# Patient Record
Sex: Female | Born: 1950
Health system: Southern US, Community
[De-identification: ages and names within clinical notes are randomized; demographics above are authoritative.]

## PROBLEM LIST (undated history)

## (undated) DIAGNOSIS — K449 Diaphragmatic hernia without obstruction or gangrene: Secondary | ICD-10-CM

## (undated) DIAGNOSIS — F32A Depression, unspecified: Secondary | ICD-10-CM

## (undated) DIAGNOSIS — I421 Obstructive hypertrophic cardiomyopathy: Secondary | ICD-10-CM

## (undated) DIAGNOSIS — K219 Gastro-esophageal reflux disease without esophagitis: Secondary | ICD-10-CM

## (undated) DIAGNOSIS — I639 Cerebral infarction, unspecified: Secondary | ICD-10-CM

## (undated) DIAGNOSIS — I2510 Atherosclerotic heart disease of native coronary artery without angina pectoris: Secondary | ICD-10-CM

## (undated) DIAGNOSIS — I350 Nonrheumatic aortic (valve) stenosis: Secondary | ICD-10-CM

## (undated) DIAGNOSIS — Z720 Tobacco use: Secondary | ICD-10-CM

## (undated) DIAGNOSIS — F329 Major depressive disorder, single episode, unspecified: Secondary | ICD-10-CM

## (undated) DIAGNOSIS — J449 Chronic obstructive pulmonary disease, unspecified: Secondary | ICD-10-CM

## (undated) DIAGNOSIS — R519 Headache, unspecified: Secondary | ICD-10-CM

## (undated) DIAGNOSIS — D649 Anemia, unspecified: Secondary | ICD-10-CM

## (undated) DIAGNOSIS — Z9889 Other specified postprocedural states: Secondary | ICD-10-CM

## (undated) DIAGNOSIS — F1010 Alcohol abuse, uncomplicated: Secondary | ICD-10-CM

## (undated) DIAGNOSIS — K759 Inflammatory liver disease, unspecified: Secondary | ICD-10-CM

## (undated) DIAGNOSIS — I10 Essential (primary) hypertension: Secondary | ICD-10-CM

## (undated) DIAGNOSIS — J189 Pneumonia, unspecified organism: Secondary | ICD-10-CM

## (undated) DIAGNOSIS — J42 Unspecified chronic bronchitis: Secondary | ICD-10-CM

## (undated) DIAGNOSIS — I499 Cardiac arrhythmia, unspecified: Secondary | ICD-10-CM

## (undated) DIAGNOSIS — R0600 Dyspnea, unspecified: Secondary | ICD-10-CM

## (undated) DIAGNOSIS — R51 Headache: Secondary | ICD-10-CM

## (undated) DIAGNOSIS — F4001 Agoraphobia with panic disorder: Secondary | ICD-10-CM

## (undated) DIAGNOSIS — I739 Peripheral vascular disease, unspecified: Secondary | ICD-10-CM

## (undated) DIAGNOSIS — F411 Generalized anxiety disorder: Secondary | ICD-10-CM

## (undated) DIAGNOSIS — I480 Paroxysmal atrial fibrillation: Secondary | ICD-10-CM

## (undated) DIAGNOSIS — E7800 Pure hypercholesterolemia, unspecified: Secondary | ICD-10-CM

## (undated) DIAGNOSIS — R112 Nausea with vomiting, unspecified: Secondary | ICD-10-CM

## (undated) DIAGNOSIS — M1990 Unspecified osteoarthritis, unspecified site: Secondary | ICD-10-CM

## (undated) DIAGNOSIS — M5430 Sciatica, unspecified side: Secondary | ICD-10-CM

## (undated) HISTORY — DX: Peripheral vascular disease, unspecified: I73.9

## (undated) HISTORY — DX: Cerebral infarction, unspecified: I63.9

## (undated) HISTORY — PX: NASAL FRACTURE SURGERY: SHX718

## (undated) HISTORY — DX: Atherosclerotic heart disease of native coronary artery without angina pectoris: I25.10

## (undated) HISTORY — PX: SHOULDER ARTHROSCOPY WITH ROTATOR CUFF REPAIR: SHX5685

## (undated) HISTORY — PX: BACK SURGERY: SHX140

## (undated) HISTORY — PX: KNEE ARTHROSCOPY: SHX127

## (undated) HISTORY — DX: Paroxysmal atrial fibrillation: I48.0

## (undated) HISTORY — PX: FRACTURE SURGERY: SHX138

## (undated) HISTORY — PX: HERNIA REPAIR: SHX51

## (undated) HISTORY — PX: LUMBAR DISC SURGERY: SHX700

## (undated) HISTORY — PX: OTHER SURGICAL HISTORY: SHX169

## (undated) HISTORY — DX: Agoraphobia with panic disorder: F40.01

## (undated) HISTORY — DX: Nonrheumatic aortic (valve) stenosis: I35.0

## (undated) HISTORY — DX: Generalized anxiety disorder: F41.1

## (undated) HISTORY — DX: Tobacco use: Z72.0

## (undated) HISTORY — DX: Obstructive hypertrophic cardiomyopathy: I42.1

## (undated) HISTORY — PX: LAPAROSCOPIC CHOLECYSTECTOMY: SUR755

## (undated) HISTORY — PX: ANAL FISTULECTOMY: SHX1139

---
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
## 2014-03-15 ENCOUNTER — Encounter (HOSPITAL_BASED_OUTPATIENT_CLINIC_OR_DEPARTMENT_OTHER): Payer: Self-pay | Admitting: Emergency Medicine

## 2014-03-15 ENCOUNTER — Emergency Department (HOSPITAL_BASED_OUTPATIENT_CLINIC_OR_DEPARTMENT_OTHER)
Admission: EM | Admit: 2014-03-15 | Discharge: 2014-03-15 | Disposition: A | Discharge status: Home / Self Care | Payer: Medicare Other | Attending: Emergency Medicine | Admitting: Emergency Medicine

## 2014-03-15 DIAGNOSIS — Z8639 Personal history of other endocrine, nutritional and metabolic disease: Secondary | ICD-10-CM | POA: Insufficient documentation

## 2014-03-15 DIAGNOSIS — Z79899 Other long term (current) drug therapy: Secondary | ICD-10-CM | POA: Diagnosis not present

## 2014-03-15 DIAGNOSIS — Z720 Tobacco use: Secondary | ICD-10-CM | POA: Diagnosis not present

## 2014-03-15 DIAGNOSIS — F410 Panic disorder [episodic paroxysmal anxiety] without agoraphobia: Secondary | ICD-10-CM | POA: Diagnosis present

## 2014-03-15 DIAGNOSIS — I10 Essential (primary) hypertension: Secondary | ICD-10-CM | POA: Insufficient documentation

## 2014-03-15 DIAGNOSIS — F419 Anxiety disorder, unspecified: Secondary | ICD-10-CM

## 2014-03-15 DIAGNOSIS — Z8719 Personal history of other diseases of the digestive system: Secondary | ICD-10-CM | POA: Insufficient documentation

## 2014-03-15 HISTORY — DX: Diaphragmatic hernia without obstruction or gangrene: K44.9

## 2014-03-15 HISTORY — DX: Essential (primary) hypertension: I10

## 2014-03-15 HISTORY — DX: Pure hypercholesterolemia, unspecified: E78.00

## 2014-03-15 MED ORDER — ALPRAZOLAM 0.5 MG PO TABS
0.5000 mg | ORAL_TABLET | Freq: Once | ORAL | Status: AC
  Administered 2014-03-15: 0.5 mg via ORAL
  Filled 2014-03-15: qty 1

## 2014-03-15 MED ORDER — ALPRAZOLAM 1 MG PO TABS: 0.5000 mg | ORAL_TABLET | Freq: Three times a day (TID) | ORAL | Status: DC | PRN

## 2014-03-15 NOTE — ED Notes (Signed)
D/c home with her sister- directed to pharmacy to pick up meds- resource guide reviewed

## 2014-03-15 NOTE — Discharge Instructions (Signed)
Panic Attacks Panic attacks are sudden, short-livedsurges of severe anxiety, fear, or discomfort. They may occur for no reason when you are relaxed, when you are anxious, or when you are sleeping. Panic attacks may occur for a number of reasons:   Healthy people occasionally have panic attacks in extreme, life-threatening situations, such as war or natural disasters. Normal anxiety is a protective mechanism of the body that helps [place] react to danger (fight or flight response).  Panic attacks are often seen with anxiety disorders, such as panic disorder, social anxiety disorder, generalized anxiety disorder, and phobias. Anxiety disorders cause excessive or uncontrollable anxiety. They may interfere with your relationships or other life activities.  Panic attacks are sometimes seen with other mental illnesses, such as depression and posttraumatic stress disorder.  Certain medical conditions, prescription medicines, and drugs of abuse can cause panic attacks. SYMPTOMS  Panic attacks start suddenly, peak within 20 minutes, and are accompanied by four or more of the following symptoms:  Pounding heart or fast heart rate (palpitations).  Sweating.  Trembling or shaking.  Shortness of breath or feeling smothered.  Feeling choked.  Chest pain or discomfort.  Nausea or strange feeling in your stomach.  Dizziness, light-headedness, or feeling like you will faint.  Chills or hot flushes.  Numbness or tingling in your lips or hands and feet.  Feeling that things are not real or feeling that you are not yourself.  Fear of losing control or going crazy.  Fear of dying. Some of these symptoms can mimic serious medical conditions. For example, you may think you are having a heart attack. Although panic attacks can be very scary, they are not life threatening. DIAGNOSIS  Panic attacks are diagnosed through an assessment by your health care provider. Your health care provider will ask  questions about your symptoms, such as where and when they occurred. Your health care provider will also ask about your medical history and use of alcohol and drugs, including prescription medicines. Your health care provider may order blood tests or other studies to rule out a serious medical condition. Your health care provider may refer you to a mental health professional for further evaluation. TREATMENT   Most healthy people who have one or two panic attacks in an extreme, life-threatening situation will not require treatment.  The treatment for panic attacks associated with anxiety disorders or other mental illness typically involves counseling with a mental health professional, medicine, or a combination of both. Your health care provider will help determine what treatment is best for you.  Panic attacks due to physical illness usually go away with treatment of the illness. If prescription medicine is causing panic attacks, talk with your health care provider about stopping the medicine, decreasing the dose, or substituting another medicine.  Panic attacks due to alcohol or drug abuse go away with abstinence. Some adults need professional help in order to stop drinking or using drugs. HOME CARE INSTRUCTIONS   Take all medicines as directed by your health care provider.   Schedule and attend follow-up visits as directed by your health care provider. It is important to keep all your appointments. SEEK MEDICAL CARE IF:  You are not able to take your medicines as prescribed.  Your symptoms do not improve or get worse. SEEK IMMEDIATE MEDICAL CARE IF:   You experience panic attack symptoms that are different than your usual symptoms.  You have serious thoughts about hurting yourself or others.  You are taking medicine for panic attacks and  have a serious side effect. °MAKE SURE YOU: °· Understand these instructions. °· Will watch your condition. °· Will get help right away if you are not  doing well or get worse. °Document Released: 05/07/2005 Document Revised: 05/12/2013 Document Reviewed: 12/19/2012 °ExitCare® Patient Information ©2015 [employer]. This information is not intended to replace advice given to you by your health care provider. Make sure you discuss any questions you have with your health care provider. ° °Emergency Department Resource Guide °1) Find a Doctor and Pay Out of Pocket °Although you won't have to find out who is covered by your insurance plan, it is a good idea to ask around and get recommendations. You will then need to call the office and see if the doctor you have chosen will accept you as a new patient and what types of options they offer for patients who are self-pay. Some doctors offer discounts or will set up payment plans for their patients who do not have insurance, but you will need to ask so you aren't surprised when you get to your appointment. ° °2) Contact Your Local Health Department °Not all health departments have doctors that can see patients for sick visits, but many do, so it is worth a call to see if yours does. If you don't know where your local health department is, you can check in your phone book. The CDC also has a tool to help you locate your state's health department, and many state websites also have listings of all of their local health departments. ° °3) Find a Walk-in Clinic °If your illness is not likely to be very severe or complicated, you may want to try a walk in clinic. These are popping up all over the country in pharmacies, drugstores, and shopping centers. They're usually staffed by nurse practitioners or physician assistants that have been trained to treat common illnesses and complaints. They're usually fairly quick and inexpensive. However, if you have serious medical issues or chronic medical problems, these are probably not your best option. ° °No Primary Care Doctor: °- Call [facility] at  832-8000 - they can help you  locate a primary care doctor that  accepts your insurance, provides certain services, etc. °- Physician Referral Service- 1-800-533-3463 ° °Chronic Pain Problems: °Organization         Address  Phone   Notes  °[facility]  (336) 297-2271 Patients need to be referred by their primary care doctor.  ° °Medication Assistance: °Organization         Address  Phone   Notes  °[place] [facility] [address] °[place], [place] [address] (336) 641-8030 --Must be a resident of [place] °-- Must have NO insurance coverage whatsoever (no Medicaid/ Medicare, etc.) °-- The pt. MUST have a primary care doctor that directs their care regularly and follows them in the community °  °[facility]  (866) 331-1348   °[employer]  (888) 892-1162   ° °Agencies that provide inexpensive medical care: °Organization         Address  Phone   Notes  °[facility]  (336) 832-8035   °[facility]    (336) 832-7272   °[facility] [address] °[place], [place] [address] (336) 832-4777   °[facility] [address], ° (336) 271-4999   °[facility]    (336) 373-0678   °[facility]    (336) 272-1050   °  [facility] ° [address], [place] Phone:  (336) 832-4444, Fax:  (336) 832-4440 Hours of Operation:  9 am - 6 pm, M-F.  Also accepts Medicaid/Medicare and self-pay.  °[facility] ° [address], [place] Phone: (336) 832-3150, Fax: (336) 832-3151. Hours of Operation:  8:30 am - 5:30 pm, M-F.  Also accepts Medicaid and self-pay.  °[facility] [place] [address], [place] Phone: (336) 878-6027   °[facility] [address], [place], [place] (336)723-1848, Ext. 123 Mondays & Thursdays: 7-9 AM.  First 15 patients are seen on a first come, first serve basis. °  ° °Medicaid-accepting [place]  Providers: ° °Organization         Address  Phone   Notes  °[facility] [address], [place] (336) 641-2100 Also accepts self-pay patients.  °[facility] [address], [place] ° (336) 856-9996   °[facility] [address], [place] (336) 288-8857   °[facility] [address] [place] [address], [place] (336) 299-7000   °Veita Bland [address], [place]  ° (336) 373-1557 Only accepts [place] Access Medicaid patients after they have their name applied to their card.  ° °Self-Pay (no insurance) in [place]: ° °Organization         Address  Phone   Notes  °Sickle Cell Patients, [facility] [address], [place] (336) 832-1970   °[facility] [address], [place] (336) 832-4400   °[facility] ° [address], [place] (336) 992-4800   °[facility]/Dr. Osei-Bonsu ° [address] [place] [address], [place] or [address], [place] (336) 841-8500 Phone number for both [place] and [place] locations is the same.  °[facility] [address], [place] (336) 299-0000   °[facility] [address] [place] [address], [place] or [address] (336) 852-7530 °(336) 878-2260   °[facility] [address], [place] (336) 350-1642, phone; (336) 294-5005, fax Sees patients 1st and 3rd Saturday of every month.  Must not qualify for public or private insurance (i.e. Medicaid, Medicare, [place] Health Choice, Veterans' Benefits) • Household income should be no more than 200% of the poverty level •The clinic cannot treat you if you are pregnant or think you are pregnant • Sexually transmitted diseases are not treated at the clinic.  ° ° °Dental Care: °Organization         Address  Phone  Notes  °[facility] [address],  (336) 641-6152 Accepts children up to age 21 who are enrolled in Medicaid or [place] Health Choice; pregnant women with a Medicaid card; and children who have applied for Medicaid or [place] Health Choice, but were declined, whose parents can pay a reduced fee at time of service.  °[facility] [place]  [address], [place] (336) 641-7733 Accepts children up to age 21 who are enrolled in Medicaid or [place] Health Choice; pregnant women with a Medicaid card; and children who have applied for Medicaid or [place] Health Choice, but were declined, whose parents can pay a reduced fee at time of service.  °[facility] ° [address], [place] (336) 641-4533 Patients are seen by appointment only. Walk-ins are not accepted. [facility] will see patients 18 years of age and older. °Monday - Tuesday (8am-5pm) °Most Wednesdays (8:30-5pm) °$30 per visit, cash only  °[facility] ° [address], [place] (336) 641-4533 Patients are seen by appointment only. Walk-ins are not accepted. [facility] will see patients 18 years of age and older. °One Wednesday Evening (Monthly: Volunteer Based).  $30 per visit, cash only  °[facility]  (919) 537-3737 for adults; Children under age 4, call Graduate Pediatric Dentistry at (919) [phone]. Children aged 4-14, please call (919) [phone] to request a pediatric application. ° Dental services are provided in all areas of dental care including fillings, crowns and bridges, complete and partial dentures, implants, gum treatment, root canals, and extractions. Preventive care is also provided. Treatment is provided to both adults and children. °Patients are selected via a lottery and there is often a waiting list. °  °[facility] [address], °[place] ° (336) 763-8833 [email or web address] °  °[facility]  [address], [place], [place] (336)723-1848, Ext. 123 Second and Fourth Thursday of each month, opens at 6:30 AM; Clinic ends at 9 AM.  Patients are seen on a first-come first-served basis, and a limited number are seen during each clinic.  ° °[facility] ° [address], [place], [place] (336) 723-7904   Eligibility Requirements °You must have lived in [place], [place], or [place] counties for at least the last three months. °  You cannot be eligible for state or federal sponsored healthcare insurance, including Veterans Administration, Medicaid, or Medicare. °  You generally cannot be eligible for healthcare insurance through your employer.  °  How to apply: °Eligibility screenings are held every Tuesday and Wednesday afternoon from 1:00 pm until 4:00 pm. You do not need an appointment for the interview!  °[facility] [address], [place], [place] 336-631-2330   °[facility] [facility]  336-342-8273   °[facility]  336-703-3100   °[facility]  336-570-6415   ° °Behavioral Health Resources in the Community: °Intensive Outpatient Programs °Organization         Address  Phone  Notes  °[facility] [address], [place], [place] 336-878-6098   °[facility] [address], [place], [place] 336-832-9800   °[facility] [address], [place], [place] ° 336-882-2125   °[facility] [address],  °[place], [place] 1-800-853-5163 or 336-641-4981   °Substance Abuse Resources °Organization         Address  Phone  Notes  °[facility]  336-882-2125   °[facility]  336-784-9470   °[facility]  336-285-9073   °[facility]  336-845-3988   °Residential & Outpatient Substance Abuse Program  1-800-659-3381   °Psychological Services °Organization         Address  Phone  Notes  °[facility]  336- 832-9600     °[facility]  336- 378-7881   °[facility] [address], [place] 1-800-853-5163 or 336-641-4981   ° °Mobile Crisis Teams °Organization         Address  Phone  Notes  °[facility], Mobile Crisis Care Unit  1-877-626-1772   °Assertive °[facility] ° [address] [place], [place] 336-834-9664   °  Sharon DeEsch [address] °[place] 336-554-5454   ° °Self-Help/Support Groups °Organization         Address  Phone             Notes  °[facility] [place] - variety of support groups  336- 373-1402 Call for more information  °Narcotics Anonymous (NA), [facility] [address], °[place]  2 meetings at this location  ° °Residential Treatment Programs °Organization         Address  Phone  Notes  °[facility] [address],    °[place]  1-866-801-8205   °[facility] ° [address], [place], [place] 704-293-8524   °[facility] [address], [place] 336-845-3988 Admissions: 8am-3pm M-F  °[facility] [address]    °[place], [place] 336-841-1104   °[facility] [address], [place], [place] 336-379-7146   °[facility] [address]  °[place], [place] 336-285-9073   °[facility] - Intensive Outpatient [address], [place], [place] 336-852-3033   °[facility] [address]  °[place], [place] 1-877-615-2722 or 336-784-9470   °[facility] [address] [place], [place] 336-227-7417 Accepts Medicaid  °[facility] [address]  ° [place] 1-800-659-3381 Substance Abuse/Addiction Treatment  ° °[place] Behavioral Health Resources °Organization         Address  Phone  Notes  °[facility]  (888) 581-9988   °Julie Brannon, PhD [address] [place], [place]   (336) 349-5553 or (336) 951-0000    °[facility]   [address] °[place], [place] (336) 349-4454   °[facility] [address], [place], [place] (336) 342-8316 Insurance/Medicaid/sponsorship through [facility]  °[facility] [address]                                    [place], [place] (336) 342-8316 Therapy/tele-psych/case  °[facility] [address]  ° [place], [place] (336) 349-2233    °Dr. Arfeen  (336) 349-4544   °[facility] [place]  [employer] [place] [facility] 1) [address], [place] °2) [address], [place] °3)  [address], [place] (336) 349-3220 °(336) 342-7768 ° °(336) 342-8140   °[place] [facility] (336) 342-1394 or (336) 342-3537 (After Hours)    ° ° ° °

## 2014-03-15 NOTE — ED Provider Notes (Signed)
CSN: [id number]     Arrival date & time 03/15/14  1404 History   First MD Initiated Contact with Patient 03/15/14 1450     Chief Complaint  Patient presents with  . Panic Attack     (Consider location/radiation/quality/duration/timing/severity/associated sxs/prior Treatment) HPI Comments: Pt comes in today for a refill on her xanax and ambien. Pt states that she has been out of both for the last five days. She moved down here form [place] in the last month and she hasn't found a doctor. She states that she is not si/hi. She has no refill on her medications. Pt states that she has been very tearful. Pt has also not been taking her paxil. She does have refill on the paxil  The history is provided by the patient. No language interpreter was used.    Past Medical History  Diagnosis Date  . Hypertension   . High cholesterol   . Hernia, hiatal    Past Surgical History  Procedure Laterality Date  . Back surgery    . Shoulder arthroscopy    . Cholecystectomy     No family history on file. History  Substance Use Topics  . Smoking status: Current Every Day Smoker -- 2.00 packs/day    Types: Cigarettes  . Smokeless tobacco: Not on file  . Alcohol Use: 25.2 oz/week    42 Cans of beer per week     Comment: 6 beers/day   OB History   Grav Para Term Preterm Abortions TAB SAB Ect Mult Living                 Review of Systems  All other systems reviewed and are negative.     Allergies  Review of patient's allergies indicates no known allergies.  Home Medications   Prior to Admission medications   Medication Sig Start Date End Date Taking? Authorizing Provider  ALPRAZolam Duanne Moron) 0.5 MG tablet Take 0.5 mg by mouth 4 (four) times daily.   Yes Historical Provider, MD  amLODipine (NORVASC) 5 MG tablet Take 5 mg by mouth daily.   Yes Historical Provider, MD  baclofen (LIORESAL) 10 MG tablet Take 10 mg by mouth 3 (three) times daily.   Yes Historical Provider, MD   lisinopril-hydrochlorothiazide (PRINZIDE,ZESTORETIC) 10-12.5 MG per tablet Take 1 tablet by mouth daily.   Yes Historical Provider, MD  PARoxetine (PAXIL) 10 MG tablet Take 10 mg by mouth daily.   Yes Historical Provider, MD  ranitidine (ZANTAC) 150 MG/10ML syrup Take by mouth 2 (two) times daily.   Yes Historical Provider, MD  zolpidem (AMBIEN) 10 MG tablet Take 10 mg by mouth at bedtime as needed for sleep.   Yes Historical Provider, MD  ALPRAZolam Duanne Moron) 1 MG tablet Take 0.5 tablets (0.5 mg total) by mouth 3 (three) times daily as needed for anxiety. 03/15/14   Glendell Docker, NP   BP 186/95  Pulse 80  Temp(Src) 97.9 F (36.6 C) (Oral)  Resp 18  Ht 5\' 2"  (1.575 m)  Wt 155 lb (70.308 kg)  BMI 28.34 kg/m2  SpO2 100% Physical Exam  Nursing note and vitals reviewed. Constitutional: She is oriented to person, place, and time. She appears well-developed and well-nourished.  Cardiovascular: Normal rate and regular rhythm.   Pulmonary/Chest: Effort normal and breath sounds normal.  Musculoskeletal: Normal range of motion.  Neurological: She is oriented to person, place, and time.  Skin: Skin is warm and dry.  Psychiatric:  tearful    ED Course  Procedures (  including critical care time) Labs Review Labs Reviewed - No data to display  Imaging Review No results found.   EKG Interpretation None      MDM   Final diagnoses:  Anxiety    Will give xanax. Pt given resource guide. No si/hi    Glendell Docker, NP 03/15/14 1516

## 2014-03-15 NOTE — ED Notes (Signed)
Pt. Reports she moved here from [place] in Sept. And has no med refills on any of her Xanax or Ambien  And has been out of her xanax and Ambien for 5 days.  Pt. Reports not sleeping and feeling anxious.    Pt. Needs a PCP and medicine.  Pt. Is able to speak and is in no  Distress in triage.

## 2014-03-17 NOTE — ED Provider Notes (Signed)
Medical screening examination/treatment/procedure(s) were performed by non-physician practitioner and as supervising physician I was immediately available for consultation/collaboration.   EKG Interpretation None        Blanchie Dessert, MD 03/17/14 1526

## 2014-04-30 ENCOUNTER — Emergency Department (HOSPITAL_BASED_OUTPATIENT_CLINIC_OR_DEPARTMENT_OTHER)
Admission: EM | Admit: 2014-04-30 | Discharge: 2014-04-30 | Disposition: A | Discharge status: Home / Self Care | Payer: Medicare HMO | Attending: Emergency Medicine | Admitting: Emergency Medicine

## 2014-04-30 ENCOUNTER — Encounter (HOSPITAL_BASED_OUTPATIENT_CLINIC_OR_DEPARTMENT_OTHER): Payer: Self-pay

## 2014-04-30 DIAGNOSIS — I10 Essential (primary) hypertension: Secondary | ICD-10-CM | POA: Insufficient documentation

## 2014-04-30 DIAGNOSIS — Z8639 Personal history of other endocrine, nutritional and metabolic disease: Secondary | ICD-10-CM | POA: Insufficient documentation

## 2014-04-30 DIAGNOSIS — Z8719 Personal history of other diseases of the digestive system: Secondary | ICD-10-CM | POA: Diagnosis not present

## 2014-04-30 DIAGNOSIS — M5431 Sciatica, right side: Secondary | ICD-10-CM | POA: Insufficient documentation

## 2014-04-30 DIAGNOSIS — Z720 Tobacco use: Secondary | ICD-10-CM | POA: Diagnosis not present

## 2014-04-30 DIAGNOSIS — Z791 Long term (current) use of non-steroidal anti-inflammatories (NSAID): Secondary | ICD-10-CM | POA: Insufficient documentation

## 2014-04-30 DIAGNOSIS — Z79899 Other long term (current) drug therapy: Secondary | ICD-10-CM | POA: Diagnosis not present

## 2014-04-30 DIAGNOSIS — M549 Dorsalgia, unspecified: Secondary | ICD-10-CM | POA: Diagnosis present

## 2014-04-30 HISTORY — DX: Sciatica, unspecified side: M54.30

## 2014-04-30 MED ORDER — HYDROMORPHONE HCL 1 MG/ML IJ SOLN
1.0000 mg | Freq: Once | INTRAMUSCULAR | Status: AC
Start: 2014-04-30 — End: 2014-04-30
  Administered 2014-04-30: 1 mg via INTRAMUSCULAR
  Filled 2014-04-30: qty 1

## 2014-04-30 MED ORDER — OXYCODONE-ACETAMINOPHEN 5-325 MG PO TABS: 1.0000 | ORAL_TABLET | Freq: Four times a day (QID) | ORAL | Status: DC | PRN

## 2014-04-30 NOTE — ED Provider Notes (Signed)
CSN: [id number]     Arrival date & time 04/30/14  1046 History   First MD Initiated Contact with Patient 04/30/14 1145     Chief Complaint  Patient presents with  . Back Pain     (Consider location/radiation/quality/duration/timing/severity/associated sxs/prior Treatment) HPI Complains of right buttock pain radiating to right foot onset one week ago typical sciatica she's had in the past. Onset after she mis-stepped no loss of bladder or bowel control no other complaint treated with Aleve without relief pain worse with walking or weightbearing improved with rest or no other associated symptoms Past Medical History  Diagnosis Date  . Hypertension   . High cholesterol   . Hernia, hiatal   . Back pain   . Sciatica    Past Surgical History  Procedure Laterality Date  . Back surgery    . Shoulder arthroscopy    . Cholecystectomy     No family history on file. History  Substance Use Topics  . Smoking status: Current Every Day Smoker -- 2.00 packs/day    Types: Cigarettes  . Smokeless tobacco: Not on file  . Alcohol Use: 25.2 oz/week    42 Cans of beer per week     Comment: last drink 1 week ago   no illicit drug use OB History    No data available     Review of Systems  Constitutional: Negative.   HENT: Negative.   Respiratory: Negative.   Cardiovascular: Negative.   Gastrointestinal: Negative.   Musculoskeletal: Positive for back pain.  Skin: Negative.   Neurological: Negative.   Psychiatric/Behavioral: Negative.   All other systems reviewed and are negative.     Allergies  Review of patient's allergies indicates no known allergies.  Home Medications   Prior to Admission medications   Medication Sig Start Date End Date Taking? Authorizing Provider  ALPRAZolam Duanne Moron) 0.5 MG tablet Take 0.5 mg by mouth 4 (four) times daily.    Historical Provider, MD  ALPRAZolam Duanne Moron) 1 MG tablet Take 0.5 tablets (0.5 mg total) by mouth 3 (three) times daily as needed for  anxiety. 03/15/14   Glendell Docker, NP  amLODipine (NORVASC) 5 MG tablet Take 5 mg by mouth daily.    Historical Provider, MD  baclofen (LIORESAL) 10 MG tablet Take 10 mg by mouth 3 (three) times daily.    Historical Provider, MD  lisinopril-hydrochlorothiazide (PRINZIDE,ZESTORETIC) 10-12.5 MG per tablet Take 1 tablet by mouth daily.    Historical Provider, MD  PARoxetine (PAXIL) 10 MG tablet Take 10 mg by mouth daily.    Historical Provider, MD  ranitidine (ZANTAC) 150 MG/10ML syrup Take by mouth 2 (two) times daily.    Historical Provider, MD  zolpidem (AMBIEN) 10 MG tablet Take 10 mg by mouth at bedtime as needed for sleep.    Historical Provider, MD   BP 187/99 mmHg  Pulse 88  Temp(Src) 98 F (36.7 C) (Oral)  Resp 20  Ht 5\' 4"  (1.626 m)  Wt 150 lb (68.04 kg)  BMI 25.73 kg/m2  SpO2 100% Physical Exam  Constitutional: She is oriented to person, place, and time. She appears well-developed and well-nourished. She appears distressed.  Appears uncomfortable  HENT:  Head: Normocephalic and atraumatic.  Eyes: Conjunctivae are normal. Pupils are equal, round, and reactive to light.  Neck: Neck supple. No tracheal deviation present. No thyromegaly present.  Cardiovascular: Normal rate and regular rhythm.   No murmur heard. Pulmonary/Chest: Effort normal and breath sounds normal.  Abdominal: Soft. Bowel sounds are  normal. She exhibits no distension. There is no tenderness.  Musculoskeletal: Normal range of motion. She exhibits no edema or tenderness.  Neurological: She is alert and oriented to person, place, and time. She has normal reflexes. Coordination normal.  DTRs symmetric bilaterally at knee jerk ankle jerk biceps toes down going bilaterally  Skin: Skin is warm and dry. No rash noted.  Psychiatric: She has a normal mood and affect.  Nursing note and vitals reviewed.   ED Course  Procedures (including critical care time) Labs Review Labs Reviewed - No data to  display  Imaging Review No results found.   EKG Interpretation None     12:15 PM pain is improved after treatment with IM hydromorphone. She is alert ambulatory Glasgow Coma Score 15. MDM  Sciatica is chronic. Plan prescription Percocet. She no longer uses Xanax. She is instructed not to drink alcohol when taking Percocet. She is furnished with resource guide to get primary care physician. She is instructed to take her blood pressure medication upon arrival home today. Blood pressure recheck 2 or 3 weeks Diagnosis #1 sciatica  #2 hypertension  Final diagnoses:  None        Orlie Dakin, MD 04/30/14 1227

## 2014-04-30 NOTE — ED Notes (Signed)
MD at bedside. 

## 2014-04-30 NOTE — Discharge Instructions (Signed)
Sciatica Take Tylenol for mild pain or the pain medicine prescribed for bad pain. Do not drink alcohol  Or use Xanax when taking the medicine prescribed, as the combination can be dangerous. Call any of the numbers on the resource guide to get a primary care physician. Take your blood pressure medication when you get home today. Today's was elevated at 160/99. Get your blood pressure rechecked within the next 2-3 weeks Sciatica is pain, weakness, numbness, or tingling along the path of the sciatic nerve. The nerve starts in the lower back and runs down the back of each leg. The nerve controls the muscles in the lower leg and in the back of the knee, while also providing sensation to the back of the thigh, lower leg, and the sole of your foot. Sciatica is a symptom of another medical condition. For instance, nerve damage or certain conditions, such as a herniated disk or bone spur on the spine, pinch or put pressure on the sciatic nerve. This causes the pain, weakness, or other sensations normally associated with sciatica. Generally, sciatica only affects one side of the body. CAUSES   Herniated or slipped disc.  Degenerative disk disease.  A pain disorder involving the narrow muscle in the buttocks (piriformis syndrome).  Pelvic injury or fracture.  Pregnancy.  Tumor (rare). SYMPTOMS  Symptoms can vary from mild to very severe. The symptoms usually travel from the low back to the buttocks and down the back of the leg. Symptoms can include:  Mild tingling or dull aches in the lower back, leg, or hip.  Numbness in the back of the calf or sole of the foot.  Burning sensations in the lower back, leg, or hip.  Sharp pains in the lower back, leg, or hip.  Leg weakness.  Severe back pain inhibiting movement. These symptoms may get worse with coughing, sneezing, laughing, or prolonged sitting or standing. Also, being overweight may worsen symptoms. DIAGNOSIS  Your caregiver will perform a  physical exam to look for common symptoms of sciatica. He or she may ask you to do certain movements or activities that would trigger sciatic nerve pain. Other tests may be performed to find the cause of the sciatica. These may include:  Blood tests.  X-rays.  Imaging tests, such as an MRI or CT scan. TREATMENT  Treatment is directed at the cause of the sciatic pain. Sometimes, treatment is not necessary and the pain and discomfort goes away on its own. If treatment is needed, your caregiver may suggest:  Over-the-counter medicines to relieve pain.  Prescription medicines, such as anti-inflammatory medicine, muscle relaxants, or narcotics.  Applying heat or ice to the painful area.  Steroid injections to lessen pain, irritation, and inflammation around the nerve.  Reducing activity during periods of pain.  Exercising and stretching to strengthen your abdomen and improve flexibility of your spine. Your caregiver may suggest losing weight if the extra weight makes the back pain worse.  Physical therapy.  Surgery to eliminate what is pressing or pinching the nerve, such as a bone spur or part of a herniated disk. HOME CARE INSTRUCTIONS   Only take over-the-counter or prescription medicines for pain or discomfort as directed by your caregiver.  Apply ice to the affected area for 20 minutes, 3-4 times a day for the first 48-72 hours. Then try heat in the same way.  Exercise, stretch, or perform your usual activities if these do not aggravate your pain.  Attend physical therapy sessions as directed by your  caregiver.  Keep all follow-up appointments as directed by your caregiver.  Do not wear high heels or shoes that do not provide proper support.  Check your mattress to see if it is too soft. A firm mattress may lessen your pain and discomfort. SEEK IMMEDIATE MEDICAL CARE IF:   You lose control of your bowel or bladder (incontinence).  You have increasing weakness in the lower  back, pelvis, buttocks, or legs.  You have redness or swelling of your back.  You have a burning sensation when you urinate.  You have pain that gets worse when you lie down or awakens you at night.  Your pain is worse than you have experienced in the past.  Your pain is lasting longer than 4 weeks.  You are suddenly losing weight without reason. MAKE SURE YOU:  Understand these instructions.  Will watch your condition.  Will get help right away if you are not doing well or get worse. Document Released: 05/01/2001 Document Revised: 11/06/2011 Document Reviewed: 09/16/2011 [facility] Patient Information 2015 [place], [place]. This information is not intended to replace advice given to you by your health care provider. Make sure you discuss any questions you have with your health care provider.  Emergency Department Resource Guide 1) Find a Doctor and Pay Out of Pocket Although you won't have to find out who is covered by your insurance plan, it is a good idea to ask around and get recommendations. You will then need to call the office and see if the doctor you have chosen will accept you as a new patient and what types of options they offer for patients who are self-pay. Some doctors offer discounts or will set up payment plans for their patients who do not have insurance, but you will need to ask so you aren't surprised when you get to your appointment.  2) Contact Your Local Health Department Not all health departments have doctors that can see patients for sick visits, but many do, so it is worth a call to see if yours does. If you don't know where your local health department is, you can check in your phone book. The CDC also has a tool to help you locate your state's health department, and many state websites also have listings of all of their local health departments.  3) Find a [facility] If your illness is not likely to be very severe or complicated, you may want to try a walk  in clinic. These are popping up all over the country in pharmacies, drugstores, and shopping centers. They're usually staffed by nurse practitioners or physician assistants that have been trained to treat common illnesses and complaints. They're usually fairly quick and inexpensive. However, if you have serious medical issues or chronic medical problems, these are probably not your best option.  No Primary Care Doctor: - Call Health Connect at  [phone] - they can help you locate a primary care doctor that  accepts your insurance, provides certain services, etc. - Physician Referral Service- [phone]  Chronic Pain Problems: Organization         Address  Phone   Notes  [facility]  [phone] Patients need to be referred by their primary care doctor.   Medication Assistance: Organization         Address  Phone   Notes  [facility] Medication [facility] [address] [place], [place] [address] [phone] --Must be a resident of [facility] -- Must  have NO insurance coverage whatsoever (no Medicaid/ Medicare, etc.) -- The pt. MUST have a primary care doctor that directs their care regularly and follows them in the community   [facility]ist  [phone]   [employer]  [phone]    Agencies that provide inexpensive medical care: Organization         Address  Phone   Notes  [place]  [phone]   [facility]    [phone]   [facility] [place], [place] [address] [phone]   [place] [address], [place] [phone]   Planned Parenthood    [phone]   [facility]    [phone]   [place] and [place] [address], [place] Phone:  [phone], Fax:  [phone] Hours of Operation:  9 am - 6 pm, M-F.  Also accepts Medicaid/Medicare and self-pay.  [facility] for [place] [address], [address], [place] Phone: [phone], Fax: [phone]. Hours of Operation:  8:30 am - 5:30 pm, M-F.  Also accepts Medicaid and self-pay.  [facility] [place] [address] [place] Phone: [phone]   [place], [place], [place] [phone], Ext. [phone] Mondays & Thursdays: 7-9 AM.  First 15 patients are seen on a first come, first serve basis.    [place] Providers:  Organization         Address  Phone   Notes  [facility] [address], [place] [phone] Also accepts self-pay patients.  [facility] [address], [place]  [phone]   [place], [address], [place] [phone]   [facility] [address] [place] [phone]   Lucianne Lei [address], [place]   [phone] Only accepts [place] Access [place] patients after they have their name applied to their card.   Self-Pay (no insurance) in [facility]:  Organization         Address  Phone   Notes  Sickle Cell Patients, [facility] [place] [phone]   [facility]gent Care [place] [phone]   [facility] [place]  [place], [place], [place] [phone]   [facility]/Dr. Osei-Bonsu  [address] [place] or [address], [place] [phone] Phone number for both [place] and [place] locations is the same.  [facility] [address], [place] [phone]   [facility] [address], [place] or [address] [phone]   [facility] [address] [place] [phone], phone; [phone], fax Sees  patients 1st and 3rd Saturday of every month.  Must not qualify for public or private insurance (i.e. Medicaid, Medicare, [place] Health Choice, Veterans' Benefits)  Household income should be no more than 200% of the poverty level The clinic cannot treat you if you are pregnant or think you are pregnant  Sexually transmitted diseases are not treated at the clinic.    Dental Care: Organization         Address  Phone  Notes  [facility] [address] [place] [phone] Accepts children up to age 61 who are enrolled in [place] or [place]; pregnant women with a Medicaid card; and children who have applied for Medicaid or [place] Health Choice, but were declined, whose parents can pay a reduced fee at time of service.  [facility]  [address], [place] [phone] Accepts children up to age 65 who are enrolled in [place] or [place]; pregnant women with a Medicaid card; and children who have applied for Medicaid or [place] Health Choice, but were declined, whose parents can pay a reduced fee at time of service.  [place] Adult [facility] Access PROGRAM  [place] [phone] Patients are seen by appointment only. Walk-ins are not accepted. [place] will see patients 56 years of age and older. Monday - Tuesday (8am-5pm) Most Wednesdays (8:30-5pm) $30 per visit, cash only  [facility] Adult Dental Access PROGRAM  [address], [facility] [phone] Patients are seen by appointment only. Walk-ins are not accepted. [place]anda will see patients 64 years of age and older. One Wednesday Evening (Monthly: Volunteer Based).  $30 per visit, cash only  [place]  [phone] for adults; Children under age 64, call Graduate Pediatric Dentistry at [phone]. Children aged 27-14, please call [phone] to request a  pediatric application.  Dental services are provided in all areas of dental care including fillings, crowns and bridges, complete and partial dentures, implants, gum treatment, root canals, and extractions. Preventive care is also provided. Treatment is provided to both adults and children. Patients are selected via a lottery and there is often a waiting list.   [facility] [address] [place]  [phone] [email or web address]   [facility] [address] [place], [place] [phone], Ext. [phone] Second and Fourth Thursday of each month, opens at 6:30 AM; Clinic ends at 9 AM.  Patients are seen on a first-come first-served basis, and a limited number are seen during each clinic.   [facility]  [address] Hillard Danker [place], [place] [phone]   Eligibility Requirements You must have lived in [place], [place], or [place] counties for at least the last three months.   You cannot be eligible for state or federal sponsored [employer], including [employer], [place], or [employer].   You generally cannot be eligible for healthcare insurance through your employer.    How to apply: Eligibility screenings are held every Tuesday and Wednesday afternoon from 1:00 pm until 4:00 pm. You do not need an appointment for the interview!  [facility] [address], [place], [place]   [place]  [place] Department  [place]  [phone]    Behavioral Health Resources in the Community: Intensive Outpatient Programs Organization         Address  Phone  Notes  [place]. [address] [place], [place] [phone]   [facility] Outpatient [address], [place], [place]   ADS: Alcohol & Drug Svcs [address], [place], [place]   [place] [address],  [place], [place] or [phone]   Substance Abuse Resources Organization  Address  Phone  Notes  Alcohol and Drug Services  [phone]   [place]  [phone]   [facility] [place]  [phone]   Chinita Pester  [phone]   Residential & Outpatient Substance Abuse Program  [phone]   Psychological Services Organization         Address  Phone  Notes  [facility] [place]  [place]  [phone]   [place] [address], [place] or [phone]    Mobile Crisis Teams Organization         Address  Phone  Notes  [facility], Mobile Crisis Care Unit  [phone]   [facility]  [address] [place], [place]   [place] [address] [place] [phone]    Self-Help/Support Groups Organization         Address  Phone             Notes  [place]. of [place] - variety of support groups  [place] Call for more information  Narcotics Anonymous (NA), [facility] [address], [employer] [place]  2 meetings at this location   Special educational needs teacher         Address  Phone  Notes  [facility]idential [facility] [place],    [place]  [phone]   [facility]  [address] [place] [address]196, [place], [place]   [place], Center [phone] Admissions: 8am-3pm M-F  Incentives Substance [place] [address],    [place], [place] [phone]   [facility] [address] [place], [place], [place]   [facility] [address]  [place], [place]   [facility] Programs - Intensive Outpatient [address] Kristeen [address], [place], [place]   [facility] (Bernie.) [place].,    [place], [place] [phone] or [phone]   [facility] (RTS) [address] [place], [place] Accepts Medicaid  [facility] [address],  [place] [phone] Substance Abuse/Addiction Treatment   [facility] Organization         Address  Phone  Notes  [facility]  [phone]   Domenic Schwab, PhD [address] [place], [place]   [phone] or [phone]   [place], [place] [phone]   [facility] [address], [place], [place] [phone] Insurance/Medicaid/sponsorship through [facility] and Families [address]                                    [place], [place] [phone] [facility] [address], [place] [phone]    Dr. Adele Schilder  [phone]   [facility] Dept. 1) [address], [place] 2) [place] 3)  [place] [address], [place] [phone]  [phone]   [place] [phone] or [phone] (After Hours)

## 2014-04-30 NOTE — ED Notes (Signed)
Back pain radiating to right buttock and down leg.

## 2014-05-05 ENCOUNTER — Encounter: Payer: Self-pay | Admitting: Medical

## 2014-05-05 ENCOUNTER — Ambulatory Visit: Payer: Medicare Other | Admitting: Medical

## 2014-05-05 ENCOUNTER — Other Ambulatory Visit: Payer: Self-pay

## 2014-05-05 ENCOUNTER — Ambulatory Visit (INDEPENDENT_AMBULATORY_CARE_PROVIDER_SITE_OTHER): Payer: Medicare HMO | Admitting: Medical

## 2014-05-05 VITALS — BP 155/85 | HR 74 | Temp 98.1°F | Ht 62.5 in | Wt 171.0 lb

## 2014-05-05 DIAGNOSIS — I10 Essential (primary) hypertension: Secondary | ICD-10-CM

## 2014-05-05 DIAGNOSIS — E781 Pure hyperglyceridemia: Secondary | ICD-10-CM | POA: Insufficient documentation

## 2014-05-05 DIAGNOSIS — M5431 Sciatica, right side: Secondary | ICD-10-CM

## 2014-05-05 DIAGNOSIS — M5430 Sciatica, unspecified side: Secondary | ICD-10-CM | POA: Insufficient documentation

## 2014-05-05 DIAGNOSIS — F32A Depression, unspecified: Secondary | ICD-10-CM

## 2014-05-05 DIAGNOSIS — K219 Gastro-esophageal reflux disease without esophagitis: Secondary | ICD-10-CM | POA: Insufficient documentation

## 2014-05-05 DIAGNOSIS — E785 Hyperlipidemia, unspecified: Secondary | ICD-10-CM

## 2014-05-05 DIAGNOSIS — F329 Major depressive disorder, single episode, unspecified: Secondary | ICD-10-CM

## 2014-05-05 MED ORDER — PAROXETINE HCL 40 MG PO TABS: 40.0000 mg | ORAL_TABLET | ORAL | Status: DC

## 2014-05-05 MED ORDER — AMLODIPINE BESYLATE 5 MG PO TABS: 5.0000 mg | ORAL_TABLET | Freq: Every day | ORAL | Status: DC

## 2014-05-05 MED ORDER — HYDROCODONE-ACETAMINOPHEN 5-325 MG PO TABS: 1.0000 | ORAL_TABLET | Freq: Four times a day (QID) | ORAL | Status: DC | PRN

## 2014-05-05 MED ORDER — LISINOPRIL-HYDROCHLOROTHIAZIDE 10-12.5 MG PO TABS: 1.0000 | ORAL_TABLET | Freq: Every day | ORAL | Status: DC

## 2014-05-05 MED ORDER — AMLODIPINE BESYLATE 10 MG PO TABS: 10.0000 mg | ORAL_TABLET | Freq: Every day | ORAL | Status: DC

## 2014-05-05 MED ORDER — CYCLOBENZAPRINE HCL 10 MG PO TABS: 10.0000 mg | ORAL_TABLET | Freq: Every day | ORAL | Status: DC

## 2014-05-05 NOTE — Assessment & Plan Note (Signed)
For you bp which has been high in ED and initially here, I am increasing your amlodipine to 10 mg and refilling your lisinopril 10/12.5 mg. Please get cmp/lab today.   Recommend getting otc blood pressure cuff and start checking bp daily. Keep log book on reading and bring back on follow up.  If patient is getting high blood pressure readings like other day in the emergency department or initially when nurse first checked her blood pressure then she is advised to be seen here urgent care or emergency department.

## 2014-05-05 NOTE — Assessment & Plan Note (Signed)
Pt has history of depression. She states stable now and on paxil for years.

## 2014-05-05 NOTE — Assessment & Plan Note (Signed)
Pt has history of gerd and hiatal hernia. She is on zantac.

## 2014-05-05 NOTE — Assessment & Plan Note (Signed)
For your sciatica type pain, I am prescribing hydrocodone and flexeril(Pt ran out of baclofen recently). When you bp has normalized/stable will advise nsaid or prednisone.

## 2014-05-05 NOTE — Progress Notes (Signed)
Subjective:    Patient ID: Sarah Barnes, female    DOB: July 19, 1950, 63 y.o.   MRN: [id number]  HPI   I have reviewed pt PMH, PSH, FH, Social History and Surgical History.   Pt has high cholesterol hx. She is on fenofibrate.  Pt has high blood pressure. She is on lisinipril 10/12.5 mg im. Pt states in past hr blood pressure ran 140/80. Sometimes lowers. Pt bp very high by nurse but I rechecked about 30 minutes after she took both lisinopril and amlodipine and both readings I got 150/80 and 155/85. Patient reports no cardiac or neurologic signs or symptoms.  Pt has history of gerd and hiatal hernia. She is on zantac.  Pt has history of depression. She states stable now and on paxil for years.  Pt has history of rt sided sciatica. She states 10 days of this most recently. Pt went to the ED the other day and ran out of percocet yesterday. ED gave her dilaudid.  She states that the original pain came on after moving quickly in the wrong way. But no particular fall or injury. She at the beginning of the exam looked very uncomfortable and then at the end of the exam seem to have better pain control or tolerating the pain easier. She is not reporting any mid lumbar pain and reporting no pain on her left side. Also no leg weakness saddle anesthesia or incontinence.  Pt prior MD in [place]. She just moved to the area.  Pt states prior rt  sciatica before but non recently.   Past Medical History  Diagnosis Date  . Hypertension   . High cholesterol   . Hernia, hiatal   . Back pain   . Sciatica     History   Social History  . Marital Status: Single    Spouse Name: N/A    Number of Children: N/A  . Years of Education: N/A   Occupational History  . Not on file.   Social History Main Topics  . Smoking status: Current Every Day Smoker -- 2.00 packs/day    Types: Cigarettes  . Smokeless tobacco: Not on file  . Alcohol Use: 25.2 oz/week    42 Cans of beer per week     Comment:  last drink 1 week ago  . Drug Use: No  . Sexual Activity: Not on file   Other Topics Concern  . Not on file   Social History Narrative    Past Surgical History  Procedure Laterality Date  . Back surgery    . Shoulder arthroscopy    . Cholecystectomy      Family History  Problem Relation Age of Onset  . Diabetes Mother   . Hyperlipidemia Mother   . Hypertension Mother   . Hypertension Father     No Known Allergies  Current Outpatient Prescriptions on File Prior to Visit  Medication Sig Dispense Refill  . ALPRAZolam (XANAX) 0.5 MG tablet Take 0.5 mg by mouth 4 (four) times daily.    Marland Kitchen ALPRAZolam (XANAX) 1 MG tablet Take 0.5 tablets (0.5 mg total) by mouth 3 (three) times daily as needed for anxiety. 20 tablet 0  . baclofen (LIORESAL) 10 MG tablet Take 10 mg by mouth 3 (three) times daily.    Marland Kitchen lisinopril-hydrochlorothiazide (PRINZIDE,ZESTORETIC) 10-12.5 MG per tablet Take 1 tablet by mouth daily.    Marland Kitchen oxyCODONE-acetaminophen (PERCOCET) 5-325 MG per tablet Take 1-2 tablets by mouth every 6 (six) hours as needed for  severe pain. 20 tablet 0  . PARoxetine (PAXIL) 10 MG tablet Take 10 mg by mouth daily.    . ranitidine (ZANTAC) 150 MG/10ML syrup Take by mouth 2 (two) times daily.    Marland Kitchen zolpidem (AMBIEN) 10 MG tablet Take 10 mg by mouth at bedtime as needed for sleep.     No current facility-administered medications on file prior to visit.    BP 155/85 mmHg  Pulse 74  Temp(Src) 98.1 F (36.7 C) (Oral)  Ht 5' 2.5" (1.588 m)  Wt 171 lb (77.565 kg)  BMI 30.76 kg/m2  SpO2 97%          Review of Systems  Constitutional: Negative for fever, chills, diaphoresis, activity change and fatigue.  Respiratory: Negative for cough, chest tightness and shortness of breath.   Cardiovascular: Negative for chest pain, palpitations and leg swelling.  Gastrointestinal: Negative for nausea, vomiting and abdominal pain.  Musculoskeletal: Negative for neck pain and neck stiffness.        Right SI tenderness that feels like it radiates to the right hamstring area.  Neurological: Negative for dizziness, tremors, seizures, syncope, facial asymmetry, speech difficulty, weakness, light-headedness, numbness and headaches.  Psychiatric/Behavioral: Negative for behavioral problems, confusion and agitation. The patient is not nervous/anxious.        Objective:   Physical Exam  General Mental Status- Alert. General Appearance- Not in acute distress.   Skin General: Color- Normal Color. Moisture- Normal Moisture.  Neck Carotid Arteries- Normal color. Moisture- Normal Moisture. No carotid bruits. No JVD.  Chest and Lung Exam Auscultation: Breath Sounds:-Normal.  Cardiovascular Auscultation:Rythm- Regular. Murmurs & Other Heart Sounds:Auscultation of the heart reveals- No Murmurs.  Abdomen Inspection:-Inspeection Normal. Palpation/Percussion:Note:No mass. Palpation and Percussion of the abdomen reveal- Non Tender, Non Distended + BS, no rebound or guarding.    Neurologic Cranial Nerve exam:- CN III-XII intact(No nystagmus), symmetric smile. Drift Test:- No drift. Romberg Exam:- Negative.  Heal to Toe Gait exam:-Normal. Finger to Nose:- Normal/Intact Strength:- 5/5 equal and symmetric strength both upper and lower extremities..   Back Mid lumbar spine tenderness to palpation. Pain on straight leg lift on the right side Pain on lateral movements and flexion/extension of the spine.(In the right SI area)  Lower ext neurologic  L5-S1 sensation intact bilaterally. Normal patellar reflexes bilaterally. No foot drop bilaterally.       Assessment & Plan:

## 2014-05-05 NOTE — Patient Instructions (Addendum)
For your sciatica type pain, I am prescribing hydrocodone and flexeril(Pt ran out of baclofen). When you bp has normalized/stable will advise nsaid or prednisone.  For you bp which has been high in ED and initially here, I am increasing your amlodipine to 10 mg and refilling your lisinopril 10/12.5 mg. Please get cmp/lab today.  For you depression history. I am refilling your paxil 40 mg q day.  If you pain persists may refer you to PT and/or pain management. I don't plan on treating sciatica with long term narcotics.   Follow up in 10 days or as needed,  Recommend getting otc blood pressure cuff and start checking bp daily. Keep log book on reading and bring back on follow up.   I sent all [facility] initially and then the patient change her mind stating she wanted the prescription sent downstairs. So I asked nurse to cancel prescriptions at [facility] and send the prescriptions downstairs.

## 2014-05-05 NOTE — Progress Notes (Signed)
Pre visit review using our clinic review tool, if applicable. No additional management support is needed unless otherwise documented below in the visit note. 

## 2014-05-05 NOTE — Assessment & Plan Note (Signed)
Pt has high cholesterol hx. She is on fenofibrate.

## 2014-05-06 LAB — CBC WITH DIFFERENTIAL/PLATELET
Basophils Absolute: 0 10*3/uL (ref 0.0–0.1)
Basophils Relative: 0.3 % (ref 0.0–3.0)
Eosinophils Absolute: 0.1 10*3/uL (ref 0.0–0.7)
Eosinophils Relative: 0.9 % (ref 0.0–5.0)
HCT: 44.6 % (ref 36.0–46.0)
Hemoglobin: 14.9 g/dL (ref 12.0–15.0)
LYMPHS PCT: 24.9 % (ref 12.0–46.0)
Lymphs Abs: 2.9 10*3/uL (ref 0.7–4.0)
MCHC: 33.5 g/dL (ref 30.0–36.0)
MCV: 95.2 fl (ref 78.0–100.0)
MONO ABS: 0.3 10*3/uL (ref 0.1–1.0)
Monocytes Relative: 2.9 % — ABNORMAL LOW (ref 3.0–12.0)
Neutro Abs: 8.4 10*3/uL — ABNORMAL HIGH (ref 1.4–7.7)
Neutrophils Relative %: 71 % (ref 43.0–77.0)
PLATELETS: 365 10*3/uL (ref 150.0–400.0)
RBC: 4.68 Mil/uL (ref 3.87–5.11)
RDW: 13.7 % (ref 11.5–15.5)
WBC: 11.8 10*3/uL — ABNORMAL HIGH (ref 4.0–10.5)

## 2014-05-06 LAB — COMPREHENSIVE METABOLIC PANEL
ALBUMIN: 4.5 g/dL (ref 3.5–5.2)
ALK PHOS: 86 U/L (ref 39–117)
ALT: 12 U/L (ref 0–35)
AST: 20 U/L (ref 0–37)
BUN: 13 mg/dL (ref 6–23)
CHLORIDE: 105 meq/L (ref 96–112)
CO2: 22 meq/L (ref 19–32)
Calcium: 9.5 mg/dL (ref 8.4–10.5)
Creatinine, Ser: 0.6 mg/dL (ref 0.4–1.2)
GFR: 104.42 mL/min (ref >60.00)
GLUCOSE: 94 mg/dL (ref 70–99)
POTASSIUM: 3.8 meq/L (ref 3.5–5.1)
SODIUM: 139 meq/L (ref 135–145)
TOTAL PROTEIN: 7.7 g/dL (ref 6.0–8.3)
Total Bilirubin: 0.6 mg/dL (ref 0.2–1.2)

## 2014-05-07 ENCOUNTER — Other Ambulatory Visit: Payer: Self-pay | Admitting: Medical

## 2014-05-07 MED ORDER — AZITHROMYCIN 250 MG PO TABS: ORAL_TABLET | ORAL | Status: DC

## 2014-05-12 ENCOUNTER — Telehealth: Payer: Self-pay | Admitting: Medical

## 2014-05-12 NOTE — Telephone Encounter (Signed)
Called patient back. Advised her that I called to let her know her wbc's were elevated and ABO was being called in. This was documented on the day it was done. Patient states if sister had taken the message she would have told her. Advised her I wasn't sure who I spoke with but I had asked to speak with her when I called. States she will pick up ABO from pharmacy.

## 2014-05-12 NOTE — Telephone Encounter (Signed)
Caller name:[name] Relation to [facility] Call back number:[phone] Pharmacy:  Reason for call: pt was seen on 12/16 for leg pain, and she had labs done as well. Pt just received a call from her pharmacy stating that she has a rx for a z-pak , pt wants to know what the rx is for and if there is something that showed up with the labs because she never got results

## 2014-05-17 ENCOUNTER — Ambulatory Visit (HOSPITAL_BASED_OUTPATIENT_CLINIC_OR_DEPARTMENT_OTHER)
Admission: RE | Admit: 2014-05-17 | Discharge: 2014-05-17 | Disposition: A | Discharge status: Home / Self Care | Payer: Medicare HMO | Source: Ambulatory Visit | Attending: Medical | Admitting: Medical

## 2014-05-17 ENCOUNTER — Ambulatory Visit (INDEPENDENT_AMBULATORY_CARE_PROVIDER_SITE_OTHER): Payer: Medicare HMO | Admitting: Medical

## 2014-05-17 ENCOUNTER — Encounter: Payer: Self-pay | Admitting: Medical

## 2014-05-17 VITALS — BP 133/83 | HR 97 | Temp 98.4°F | Ht 62.5 in | Wt 174.2 lb

## 2014-05-17 DIAGNOSIS — Z720 Tobacco use: Secondary | ICD-10-CM

## 2014-05-17 DIAGNOSIS — R05 Cough: Secondary | ICD-10-CM | POA: Diagnosis not present

## 2014-05-17 DIAGNOSIS — J209 Acute bronchitis, unspecified: Secondary | ICD-10-CM | POA: Insufficient documentation

## 2014-05-17 DIAGNOSIS — R059 Cough, unspecified: Secondary | ICD-10-CM

## 2014-05-17 DIAGNOSIS — F411 Generalized anxiety disorder: Secondary | ICD-10-CM

## 2014-05-17 DIAGNOSIS — D72829 Elevated white blood cell count, unspecified: Secondary | ICD-10-CM | POA: Insufficient documentation

## 2014-05-17 DIAGNOSIS — F17200 Nicotine dependence, unspecified, uncomplicated: Secondary | ICD-10-CM

## 2014-05-17 DIAGNOSIS — M5430 Sciatica, unspecified side: Secondary | ICD-10-CM

## 2014-05-17 DIAGNOSIS — I10 Essential (primary) hypertension: Secondary | ICD-10-CM

## 2014-05-17 MED ORDER — ALPRAZOLAM 0.5 MG PO TABS: 0.5000 mg | ORAL_TABLET | Freq: Two times a day (BID) | ORAL | Status: DC | PRN

## 2014-05-17 MED ORDER — CLARITHROMYCIN ER 500 MG PO TB24: 1000.0000 mg | ORAL_TABLET | Freq: Every day | ORAL | Status: DC

## 2014-05-17 NOTE — Assessment & Plan Note (Signed)
Resolved

## 2014-05-17 NOTE — Progress Notes (Signed)
Pre visit review using our clinic review tool, if applicable. No additional management support is needed unless otherwise documented below in the visit note. 

## 2014-05-17 NOTE — Assessment & Plan Note (Signed)
I am prescribing biaxin for persisting  bronchitis.   For you hx of smoking and elevated wbc, I will get cxr and cbc.

## 2014-05-17 NOTE — Patient Instructions (Addendum)
I am prescribing biaxin for persisting  bronchitis.   For you hx of smoking and elevated wbc, I will get cxr and cbc.  For our htn which is better now, I am prescribing amlodipine. The increase dosing has helped.(you have refills)  For you anxiety, I will prescribe xanax .5 mg twice daily. I want to see how you do on that dose and frequency. We reviewed controlled medication contract process and in 2 wks on follow up you will sign. I explained on my philosophy regarding not increasing dosage of xanax or frequency of use. As well as using the least number of controlled meds as possible.  Follow up in 2 wks or as needed.  I reviewed her wbc which was elevated. On follow up after discussion with her may repeat cbc and may refer her to hematologist.

## 2014-05-17 NOTE — Progress Notes (Signed)
Subjective:    Patient ID: Sarah Barnes, female    DOB: Jan 27, 1951, 63 y.o.   MRN: [id number]  HPI   Pt in for follow up. I called in the antibiotic based on smoking history and my suspicion that lungs likely cause of increase white count. She took the antibiotic. Then she started to bring up the mucous and coughing began. She states felt like the antibiotic started to break up infection. No fevers, no chills. Some wheezing when she lays down only. Pt smoked a long time in the past. She quite smoking for 7 years but retsarted 12 years ago.  Pt bp is a lot better today than it was on the last visit. I increased her amlodipine to 10 mg from 5 mg on the last visit. She is still on the lisinopril  10/12.5 mg q day. Pt bp is 133/83. I will prescribe the amlodipine with refills.  Pt sciatica also flared down. Got better 3 days.    Pt has history of anxiety and on xanax for 20 years. Pt was on .5 mg 4 times a day most recently due to family stress. Indicates some stress related to her brother.  Past Medical History  Diagnosis Date  . Hypertension   . High cholesterol   . Hernia, hiatal   . Back pain   . Sciatica     History   Social History  . Marital Status: Single    Spouse Name: N/A    Number of Children: N/A  . Years of Education: N/A   Occupational History  . Not on file.   Social History Main Topics  . Smoking status: Current Every Day Smoker -- 2.00 packs/day    Types: Cigarettes  . Smokeless tobacco: Not on file  . Alcohol Use: 25.2 oz/week    42 Cans of beer per week     Comment: last drink 1 week ago  . Drug Use: No  . Sexual Activity: Not on file   Other Topics Concern  . Not on file   Social History Narrative    Past Surgical History  Procedure Laterality Date  . Back surgery    . Shoulder arthroscopy    . Cholecystectomy      Family History  Problem Relation Age of Onset  . Diabetes Mother   . Hyperlipidemia Mother   . Hypertension Mother   .  Hypertension Father     No Known Allergies  Current Outpatient Prescriptions on File Prior to Visit  Medication Sig Dispense Refill  . amLODipine (NORVASC) 10 MG tablet Take 1 tablet (10 mg total) by mouth daily. 30 tablet 3  . baclofen (LIORESAL) 10 MG tablet Take 10 mg by mouth 3 (three) times daily.    . cyclobenzaprine (FLEXERIL) 10 MG tablet Take 1 tablet (10 mg total) by mouth at bedtime. 10 tablet 0  . fenofibrate micronized (LOFIBRA) 134 MG capsule Take 134 mg by mouth daily before breakfast.    . lisinopril-hydrochlorothiazide (PRINZIDE,ZESTORETIC) 10-12.5 MG per tablet Take 1 tablet by mouth daily.    Marland Kitchen lisinopril-hydrochlorothiazide (PRINZIDE,ZESTORETIC) 10-12.5 MG per tablet Take 1 tablet by mouth daily. 30 tablet 3  . naproxen (NAPROSYN) 500 MG tablet Take 500 mg by mouth 3 (three) times daily with meals.    Marland Kitchen PARoxetine (PAXIL) 40 MG tablet Take 1 tablet (40 mg total) by mouth every morning. 30 tablet 2  . ranitidine (ZANTAC) 150 MG/10ML syrup Take by mouth 2 (two) times daily.    Marland Kitchen  zolpidem (AMBIEN) 10 MG tablet Take 10 mg by mouth at bedtime as needed for sleep.     No current facility-administered medications on file prior to visit.    BP 133/83 mmHg  Pulse 97  Temp(Src) 98.4 F (36.9 C) (Oral)  Ht 5' 2.5" (1.588 m)  Wt 174 lb 3.2 oz (79.017 kg)  BMI 31.33 kg/m2  SpO2 97%       Review of Systems  Constitutional: Negative for fever, chills and fatigue.  HENT: Negative for congestion, ear discharge, facial swelling, mouth sores, nosebleeds, postnasal drip, sinus pressure, sore throat and tinnitus.   Respiratory: Positive for cough.        Still productive.  Cardiovascular: Negative for chest pain and palpitations.  Gastrointestinal: Negative for nausea, abdominal pain, diarrhea, constipation, blood in stool, abdominal distention and anal bleeding.  Genitourinary: Negative.   Musculoskeletal: Positive for back pain.       Sciatica much improved/resolved.    Neurological: Negative for dizziness, tremors, seizures, syncope, facial asymmetry, speech difficulty, weakness, light-headedness, numbness and headaches.  Hematological: Negative for adenopathy. Does not bruise/bleed easily.  Psychiatric/Behavioral: Negative for behavioral problems, confusion, sleep disturbance, self-injury, dysphoric mood, decreased concentration and agitation. The patient is nervous/anxious.        Objective:   Physical Exam   General  Mental Status - Alert. General Appearance - Well groomed. Not in acute distress.  Skin Rashes- No Rashes.  HEENT Head- Normal. Ear Auditory Canal - Left- Normal. Right - Normal.Tympanic Membrane- Left- Normal. Right- Normal. Eye Sclera/Conjunctiva- Left- Normal. Right- Normal. Nose & Sinuses Nasal Mucosa- Left-  Not oggy or Congested. Right-  Not  boggy or Congested. Mouth & Throat Lips: Upper Lip- Normal: no dryness, cracking, pallor, cyanosis, or vesicular eruption. Lower Lip-Normal: no dryness, cracking, pallor, cyanosis or vesicular eruption. Buccal Mucosa- Bilateral- No Aphthous ulcers. Oropharynx- No Discharge or Erythema. Tonsils: Characteristics- Bilateral- No Erythema or Congestion. Size/Enlargement- Bilateral- No enlargement. Discharge- bilateral-None.  Neck Neck- Supple. No Masses.   Chest and Lung Exam Auscultation: Breath Sounds:- even and unlabored, but bilateral upper lobe rhonchi.  Cardiovascular Auscultation:Rythm- Regular, rate and rhythm. Murmurs & Other Heart Sounds:Ausculatation of the heart reveal- No Murmurs.  Lymphatic Head & Neck General Head & Neck Lymphatics: Bilateral: Description- No Localized lymphadenopathy.  Back Mid lumbar spine tenderness to palpation. Pain on straight leg lift. Pain on lateral movements and flexion/extension of the spine. No si tenderness to palpation today.   Neurologic Cranial Nerve exam:- CN III-XII intact(No nystagmus), symmetric smile. Romberg Exam:-  Negative.  tStrength:- 5/5 equal and symmetric strength both upper and lower extremities.        Assessment & Plan:

## 2014-05-17 NOTE — Assessment & Plan Note (Signed)
For you anxiety, I will prescribe xanax .5 mg twice daily. I want to see how you do on that dose and frequency. We reviewed controlled medication contract process and in 2 wks on follow up you will sign. I explained on my philosophy regarding not increasing dosage of xanax or frequency of use. As well as using the least number of controlled meds as possible.

## 2014-05-17 NOTE — Assessment & Plan Note (Signed)
For your htn which is better now, I am prescribing amlodipine. The increased to 10 mg from 5 mg has helped.(you have refills) Continue lisinopril

## 2014-05-18 LAB — CBC WITH DIFFERENTIAL/PLATELET
BASOS PCT: 1 % (ref 0.0–3.0)
Basophils Absolute: 0.1 10*3/uL (ref 0.0–0.1)
Eosinophils Absolute: 0.1 10*3/uL (ref 0.0–0.7)
Eosinophils Relative: 0.7 % (ref 0.0–5.0)
HCT: 44.4 % (ref 36.0–46.0)
HEMOGLOBIN: 15.1 g/dL — AB (ref 12.0–15.0)
LYMPHS PCT: 36.4 % (ref 12.0–46.0)
Lymphs Abs: 4.9 10*3/uL — ABNORMAL HIGH (ref 0.7–4.0)
MCHC: 34.1 g/dL (ref 30.0–36.0)
MCV: 94.4 fl (ref 78.0–100.0)
Monocytes Absolute: 0.9 10*3/uL (ref 0.1–1.0)
Monocytes Relative: 6.5 % (ref 3.0–12.0)
NEUTROS ABS: 7.5 10*3/uL (ref 1.4–7.7)
Neutrophils Relative %: 55.4 % (ref 43.0–77.0)
Platelets: 354 10*3/uL (ref 150.0–400.0)
RBC: 4.7 Mil/uL (ref 3.87–5.11)
RDW: 13.6 % (ref 11.5–15.5)
WBC: 13.5 10*3/uL — ABNORMAL HIGH (ref 4.0–10.5)

## 2014-06-09 ENCOUNTER — Ambulatory Visit: Payer: Commercial Managed Care - HMO | Admitting: Medical

## 2014-06-09 ENCOUNTER — Telehealth: Payer: Self-pay | Admitting: *Deleted

## 2014-06-09 NOTE — Telephone Encounter (Signed)
Pt did not show for appointment 06/09/14 at 1:00pm for med refill. Rescheduled day of appointment for 06/15/14.  Charge no show fee?

## 2014-06-15 ENCOUNTER — Encounter: Payer: Self-pay | Admitting: Medical

## 2014-06-15 ENCOUNTER — Ambulatory Visit (INDEPENDENT_AMBULATORY_CARE_PROVIDER_SITE_OTHER): Payer: Commercial Managed Care - HMO | Admitting: Medical

## 2014-06-15 VITALS — BP 138/80 | HR 95 | Temp 98.4°F | Ht 62.5 in | Wt 174.4 lb

## 2014-06-15 DIAGNOSIS — K2100 Gastro-esophageal reflux disease with esophagitis, without bleeding: Secondary | ICD-10-CM

## 2014-06-15 DIAGNOSIS — G4700 Insomnia, unspecified: Secondary | ICD-10-CM | POA: Diagnosis not present

## 2014-06-15 DIAGNOSIS — T148 Other injury of unspecified body region: Secondary | ICD-10-CM

## 2014-06-15 DIAGNOSIS — F411 Generalized anxiety disorder: Secondary | ICD-10-CM | POA: Diagnosis not present

## 2014-06-15 DIAGNOSIS — T148XXA Other injury of unspecified body region, initial encounter: Secondary | ICD-10-CM

## 2014-06-15 LAB — CBC WITH DIFFERENTIAL/PLATELET
Basophils Absolute: 0 10*3/uL (ref 0.0–0.1)
Basophils Relative: 0.4 % (ref 0.0–3.0)
EOS PCT: 0.3 % (ref 0.0–5.0)
Eosinophils Absolute: 0 10*3/uL (ref 0.0–0.7)
HEMATOCRIT: 43.4 % (ref 36.0–46.0)
Hemoglobin: 15.3 g/dL — ABNORMAL HIGH (ref 12.0–15.0)
LYMPHS ABS: 3 10*3/uL (ref 0.7–4.0)
Lymphocytes Relative: 26.5 % (ref 12.0–46.0)
MCHC: 35.3 g/dL (ref 30.0–36.0)
MCV: 92.7 fl (ref 78.0–100.0)
Monocytes Absolute: 0.9 10*3/uL (ref 0.1–1.0)
Monocytes Relative: 7.9 % (ref 3.0–12.0)
NEUTROS PCT: 64.9 % (ref 43.0–77.0)
Neutro Abs: 7.4 10*3/uL (ref 1.4–7.7)
Platelets: 336 10*3/uL (ref 150.0–400.0)
RBC: 4.68 Mil/uL (ref 3.87–5.11)
RDW: 13.9 % (ref 11.5–15.5)
WBC: 11.4 10*3/uL — ABNORMAL HIGH (ref 4.0–10.5)

## 2014-06-15 MED ORDER — PAROXETINE HCL 40 MG PO TABS: 40.0000 mg | ORAL_TABLET | ORAL | Status: DC

## 2014-06-15 MED ORDER — ALPRAZOLAM 0.5 MG PO TABS: 0.5000 mg | ORAL_TABLET | Freq: Two times a day (BID) | ORAL | Status: DC | PRN

## 2014-06-15 MED ORDER — OMEPRAZOLE 20 MG PO CPDR: 20.0000 mg | DELAYED_RELEASE_CAPSULE | Freq: Every day | ORAL | Status: DC

## 2014-06-15 MED ORDER — RANITIDINE HCL 150 MG PO TABS: 150.0000 mg | ORAL_TABLET | Freq: Two times a day (BID) | ORAL | Status: DC

## 2014-06-15 NOTE — Patient Instructions (Addendum)
   For the insomnia you could take xanax since this is associated with your anxiety.  I am adding omeprazole to your zantac regimen. But since your reflux is severe I will refer you to GI.  For your easy bruising please get cbc today will check you platlets.  For you htn, continue you bp medications.  Follow up in 2 months or as needed.

## 2014-06-15 NOTE — Progress Notes (Signed)
Pre visit review using our clinic review tool, if applicable. No additional management support is needed unless otherwise documented below in the visit note. 

## 2014-06-15 NOTE — Assessment & Plan Note (Signed)
For your depression and anxiety, I am refilling your paxil and refilling  your xanax. You filled out controlled med contract today. Get UDS today in our lab.

## 2014-06-15 NOTE — Assessment & Plan Note (Signed)
I am adding omeprazole to your zantac regimen. But since your reflux is severe I will refer you to GI.

## 2014-06-15 NOTE — Assessment & Plan Note (Signed)
I think xanax will help with her sleep since she reports anxiety causing insomnia. Will hold off presently and not refill her Lorrin Mais as that would be another controlled and ideally not indicated for long term.

## 2014-06-15 NOTE — Progress Notes (Signed)
Subjective:    Patient ID: Sarah Barnes, female    DOB: 06-10-1950, 64 y.o.   MRN: [id number]  HPI   Pt in states she is depressed. She states has not been out of house much. Just leaves house for essentially. Pt younger passed away in 2023/02/18. She was depressed a little bit before holidays. Mood worsened after the holidays. She has hx of anxiety and some depression. Pt is on paxil. Her anxiety has increased recently and she ran out of her xanax. Homicidal and suicidal ideations denied.  Pt has some insomnia as well.  Pt has history of hiatal hernia. When she lays down will cough. She does belching. She takes zantac. Does not report upper respiratory of infectious type sympoms today.  Pt recently mild bruising to her hands no fall or trauma. Last 2 weeks.  Past Medical History  Diagnosis Date  . Hypertension   . High cholesterol   . Hernia, hiatal   . Back pain   . Sciatica     History   Social History  . Marital Status: Single    Spouse Name: N/A    Number of Children: N/A  . Years of Education: N/A   Occupational History  . Not on file.   Social History Main Topics  . Smoking status: Current Every Day Smoker -- 2.00 packs/day    Types: Cigarettes  . Smokeless tobacco: Not on file  . Alcohol Use: 25.2 oz/week    42 Cans of beer per week     Comment: last drink 1 week ago  . Drug Use: No  . Sexual Activity: Not on file   Other Topics Concern  . Not on file   Social History Narrative    Past Surgical History  Procedure Laterality Date  . Back surgery    . Shoulder arthroscopy    . Cholecystectomy      Family History  Problem Relation Age of Onset  . Diabetes Mother   . Hyperlipidemia Mother   . Hypertension Mother   . Hypertension Father     No Known Allergies  Current Outpatient Prescriptions on File Prior to Visit  Medication Sig Dispense Refill  . amLODipine (NORVASC) 10 MG tablet Take 1 tablet (10 mg total) by mouth daily. 30 tablet 3  .  baclofen (LIORESAL) 10 MG tablet Take 10 mg by mouth 3 (three) times daily.    . clarithromycin (BIAXIN XL) 500 MG 24 hr tablet Take 2 tablets (1,000 mg total) by mouth daily. 14 tablet 0  . cyclobenzaprine (FLEXERIL) 10 MG tablet Take 1 tablet (10 mg total) by mouth at bedtime. 10 tablet 0  . fenofibrate micronized (LOFIBRA) 134 MG capsule Take 134 mg by mouth daily before breakfast.    . lisinopril-hydrochlorothiazide (PRINZIDE,ZESTORETIC) 10-12.5 MG per tablet Take 1 tablet by mouth daily.    Marland Kitchen lisinopril-hydrochlorothiazide (PRINZIDE,ZESTORETIC) 10-12.5 MG per tablet Take 1 tablet by mouth daily. 30 tablet 3  . naproxen (NAPROSYN) 500 MG tablet Take 500 mg by mouth 3 (three) times daily with meals.    . ranitidine (ZANTAC) 150 MG/10ML syrup Take by mouth 2 (two) times daily.    Marland Kitchen zolpidem (AMBIEN) 10 MG tablet Take 10 mg by mouth at bedtime as needed for sleep.     No current facility-administered medications on file prior to visit.    BP 138/80 mmHg  Pulse 95  Temp(Src) 98.4 F (36.9 C) (Oral)  Ht 5' 2.5" (1.588 m)  Wt 174 lb 6.4  oz (79.107 kg)  BMI 31.37 kg/m2  SpO2 97%        Review of Systems  Constitutional: Negative for fever, chills and fatigue.  HENT: Negative.   Respiratory: Positive for cough. Negative for chest tightness, shortness of breath and wheezing.        Dry cough lying supine when belching.  Gastrointestinal: Positive for abdominal pain.       Pain epigastric. Cough lying supine and belches.  Musculoskeletal: Negative for back pain.  Hematological: Bruises/bleeds easily.  Psychiatric/Behavioral: Positive for sleep disturbance and dysphoric mood. Negative for behavioral problems, confusion and agitation. The patient is nervous/anxious.        Objective:   Physical Exam   General Mental Status- Alert. General Appearance- Not in acute distress.   Skin General: Color- Normal Color. Moisture- Normal Moisture.  Neck Carotid Arteries- Normal color.  Moisture- Normal Moisture. No carotid bruits. No JVD.  Chest and Lung Exam Auscultation: Breath Sounds:-Normal.  Cardiovascular Auscultation:Rythm- Regular. Murmurs & Other Heart Sounds:Auscultation of the heart reveals- No Murmurs.  Abdomen Inspection:-Inspeection Normal. Palpation/Percussion:Note:No mass. Palpation and Percussion of the abdomen reveal- Non Tender, Non Distended + BS, no rebound or guarding.    Neurologic Cranial Nerve exam:- CN III-XII intact(No nystagmus), symmetric smile. Drift Test:- No drift. Romberg Exam:- Negative.  Strength:- 5/5 equal and symmetric strength both upper and lower extremities.       Assessment & Plan:

## 2014-06-16 ENCOUNTER — Telehealth: Payer: Self-pay | Admitting: Medical

## 2014-06-16 NOTE — Telephone Encounter (Signed)
emmi emailed °

## 2014-07-15 ENCOUNTER — Other Ambulatory Visit: Payer: Self-pay

## 2014-07-15 NOTE — Telephone Encounter (Signed)
My instruction on xanax  rx was bid as needed for anxiety. I was under impression she was using mostly at night only. Insteady pt has been using it bid. I will write limited rx. Ask LPN to advise to patient to discuss her anxiety.

## 2014-07-15 NOTE — Telephone Encounter (Signed)
Patient statyes she takes 2 tablets daily not prn. States she was only given enough tablets to last 14 days. Told patient I waould disuss with ES because we have them ordered for q 2 hours/prn.

## 2014-07-15 NOTE — Telephone Encounter (Signed)
Pt wants refill of her xanax. Last time I saw her she was out of her medication. I did drug screen and this showed that was the case. I reviewed rx and looks like she had refill. So would run out at earliest some time in march. Will get lpn to call and ask is she aware she has refill. Can't rx until around march 26th.

## 2014-07-15 NOTE — Telephone Encounter (Signed)
Decided to have her come in to discuss since she would have to pick up rx of xanax  anyway.

## 2014-07-16 NOTE — Telephone Encounter (Signed)
Called patient. Agreed to come in to discuss Xanax dosage.

## 2014-08-11 ENCOUNTER — Encounter (HOSPITAL_BASED_OUTPATIENT_CLINIC_OR_DEPARTMENT_OTHER): Payer: Self-pay | Admitting: Family Medicine

## 2014-08-11 ENCOUNTER — Emergency Department (HOSPITAL_BASED_OUTPATIENT_CLINIC_OR_DEPARTMENT_OTHER)
Admission: EM | Admit: 2014-08-11 | Discharge: 2014-08-11 | Disposition: A | Discharge status: Home / Self Care | Payer: Medicare HMO | Attending: Emergency Medicine | Admitting: Emergency Medicine

## 2014-08-11 DIAGNOSIS — Y998 Other external cause status: Secondary | ICD-10-CM | POA: Diagnosis not present

## 2014-08-11 DIAGNOSIS — Y9289 Other specified places as the place of occurrence of the external cause: Secondary | ICD-10-CM | POA: Insufficient documentation

## 2014-08-11 DIAGNOSIS — Z23 Encounter for immunization: Secondary | ICD-10-CM | POA: Insufficient documentation

## 2014-08-11 DIAGNOSIS — I10 Essential (primary) hypertension: Secondary | ICD-10-CM | POA: Diagnosis not present

## 2014-08-11 DIAGNOSIS — Z720 Tobacco use: Secondary | ICD-10-CM | POA: Insufficient documentation

## 2014-08-11 DIAGNOSIS — E780 Pure hypercholesterolemia: Secondary | ICD-10-CM | POA: Diagnosis not present

## 2014-08-11 DIAGNOSIS — X100XXA Contact with hot drinks, initial encounter: Secondary | ICD-10-CM | POA: Diagnosis not present

## 2014-08-11 DIAGNOSIS — T2121XA Burn of second degree of chest wall, initial encounter: Secondary | ICD-10-CM | POA: Insufficient documentation

## 2014-08-11 DIAGNOSIS — Z8739 Personal history of other diseases of the musculoskeletal system and connective tissue: Secondary | ICD-10-CM | POA: Insufficient documentation

## 2014-08-11 DIAGNOSIS — IMO0002 Reserved for concepts with insufficient information to code with codable children: Secondary | ICD-10-CM

## 2014-08-11 DIAGNOSIS — T2101XA Burn of unspecified degree of chest wall, initial encounter: Secondary | ICD-10-CM | POA: Diagnosis present

## 2014-08-11 DIAGNOSIS — Y9389 Activity, other specified: Secondary | ICD-10-CM | POA: Diagnosis not present

## 2014-08-11 DIAGNOSIS — Z79899 Other long term (current) drug therapy: Secondary | ICD-10-CM | POA: Diagnosis not present

## 2014-08-11 MED ORDER — SILVER SULFADIAZINE 1 % EX CREA
TOPICAL_CREAM | Freq: Once | CUTANEOUS | Status: AC
  Administered 2014-08-11: 1 via TOPICAL
  Filled 2014-08-11: qty 85

## 2014-08-11 MED ORDER — SILVER SULFADIAZINE 1 % EX CREA: 1.0000 "application " | TOPICAL_CREAM | Freq: Every day | CUTANEOUS | Status: DC

## 2014-08-11 MED ORDER — HYDROCODONE-ACETAMINOPHEN 5-325 MG PO TABS
1.0000 | ORAL_TABLET | ORAL | Status: DC | PRN
Start: 2014-08-11 — End: 2014-08-16

## 2014-08-11 MED ORDER — TETANUS-DIPHTH-ACELL PERTUSSIS 5-2.5-18.5 LF-MCG/0.5 IM SUSP
0.5000 mL | Freq: Once | INTRAMUSCULAR | Status: AC
  Administered 2014-08-11: 0.5 mL via INTRAMUSCULAR
  Filled 2014-08-11: qty 0.5
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
## 2014-08-11 NOTE — Discharge Instructions (Signed)
Burn Care Your skin is a natural barrier to infection. It is the largest organ of your body. Burns damage this natural protection. To help prevent infection, it is very important to follow your caregiver's instructions in the care of your burn. Burns are classified as:  First degree. There is only redness of the skin (erythema). No scarring is expected.  Second degree. There is blistering of the skin. Scarring may occur with deeper burns.  Third degree. All layers of the skin are injured, and scarring is expected. HOME CARE INSTRUCTIONS   Wash your hands well before changing your bandage.  Change your bandage as often as directed by your caregiver.  Remove the old bandage. If the bandage sticks, you may soak it off with cool, clean water.  Cleanse the burn thoroughly but gently with mild soap and water.  Pat the area dry with a clean, dry cloth.  Apply a thin layer of antibacterial cream to the burn.  Apply a clean bandage as instructed by your caregiver.  Keep the bandage as clean and dry as possible.  Elevate the affected area for the first 24 hours, then as instructed by your caregiver.  Only take over-the-counter or prescription medicines for pain, discomfort, or fever as directed by your caregiver. SEEK IMMEDIATE MEDICAL CARE IF:   You develop excessive pain.  You develop redness, tenderness, swelling, or red streaks near the burn.  The burned area develops yellowish-white fluid (pus) or a bad smell.  You have a fever. MAKE SURE YOU:   Understand these instructions.  Will watch your condition.  Will get help right away if you are not doing well or get worse. Document Released: 05/07/2005 Document Revised: 07/30/2011 Document Reviewed: 09/27/2010 [facility] Patient Information 2015 [place], [place]. This information is not intended to replace advice given to you by your health care provider. Make sure you discuss any questions you have with your health care  provider.  Second-Degree Burn A second-degree burn affects the 2 outer layers of skin. The outer layer (epidermis) and the layer underneath it (dermis) are both burned. Another name for this type of burn is a partial thickness burn. A second-degree burn may be called minor or major. This depends on the size of the burn. It also depends on what parts of the skin are burned. Minor burns may be treated with first aid. Major burns are a medical emergency. A second-degree burn is worse than a first-degree burn, but not as bad as a third-degree burn. A first-degree burn affects only the epidermis. A third-degree burn goes through all the layers of skin. A second-degree burn usually heals in 3 to 4 weeks. A minor second-degree burn usually does not leave a scar.Deeper second-degree burns may lead to scarring of the skin or contractures over joints.Contractures are scars that form over joints and may lead to reduced mobility at those joints. CAUSES  Heat (thermal) injury. This happens when skin comes in contact with something very hot. It could be a flame, a hot object, hot liquid, or steam. Most second-degree burns are thermal injuries.  Radiation. Sunlight is one type of radiation that can burn the skin. Another type of radiation is used to heat food. Radiation is also used to treat some diseases, such as cancer. All types of radiation can burn the skin. Sunlight usually causes a first-degree burn. Radiation used for heating food or treating a disease can cause a second-degree burn.  Electricity. Electrical burns can cause more damage under the skin than  on the surface. They should always be treated as major burns.  Chemicals. Many chemicals can burn the skin. The burn should be flushed with cool water and checked by an emergency caregiver. SYMPTOMS Symptoms of second-degree burns include:  Severe pain.  Extreme tenderness.  Deep redness.  Blistered skin.  Skin that has changed color.It might  look blotchy, wet, or shiny.  Swelling. TREATMENT Some second-degree burns may need to be treated in a hospital. These include major burns, electrical burns, and chemical burns. Many other second-degree burns can be treated with regular first aid, such as:  Cooling the burn. Use cool, germ-free (sterile) salt water. Place the burned area of skin into a tub of water, or cover the burned area with clean, wet towels.  Taking pain medicine.  Removing the dead skin from broken blisters. A trained caregiver may do this. Do not pop blisters.  Gently washing your skin with mild soap.  Covering the burned area with a cream.Silver sulfadiazine is a cream for burns. An antibiotic cream, such as bacitracin, may also be used to fight infection. Do not use other ointments or creams unless your caregiver says it is okay.  Protecting the burn with a sterile, non-sticky bandage.  Bandaging fingers and toes separately. This keeps them from sticking together.  Taking an antibiotic. This can help prevent infection.  Getting a tetanus shot. HOME CARE INSTRUCTIONS Medication  Take any medicine prescribed by your caregiver. Follow the directions carefully.  Ask your caregiver if you can take over-the-counter medicine to relieve pain and swelling. Do not give aspirin to children.  Make sure your caregiver knows about all other medicines you take.This includes over-the-counter medicines. Burn care  You will need to change the bandage on your burn. You may need to do this 2 or 3 times each day.  Gently clean the burned area.  Put ointment on it.  Cover the burn with a sterile bandage.  For some deeper burns or burns that cover a large area, compression garments may be prescribed. These garments can help minimize scarring and protect your mobility.  Do not put butter or oil on your skin. Use only the cream prescribed by your caregiver.  Do not put ice on your burn.  Do not break blisters on  your skin.  Keep the bandaged area dry. You might need to take a sponge bath for awhile.Ask your caregiver when you can take a shower or a tub bath again.  Do not scratch an itchy burn. Your caregiver may give you medicine to relieve very bad itching.  Infection is a big danger after a second-degree burn. Tell your caregiver right away if you have signs of infection, such as:  Redness or changing color in the burned area.  Fluid leaking from the burn.  Swelling in the burn area.  A bad smell coming from the wound. Follow-up  Keep all follow-up appointments.This is important. This is how your caregiver can tell if your treatment is working.  Protect your burn from sunlight.Use sunscreen whenever you go outside.Burned areas may be sensitive to the sun for up to 1 year. Exposure to the sun may also cause permanent darkening of scars. SEEK MEDICAL CARE IF:  You have any questions about medicines.  You have any questions about your treatment.  You wonder if it is okay to do a particular activity.  You develop a fever of more than 100.5 F (38.1 C). SEEK IMMEDIATE MEDICAL CARE IF:  You think your burn  might be infected. It may change color, become red, leak fluid, swell, or smell bad.  You develop a fever of more than 102 F (38.9 C). Document Released: 10/09/2010 Document Revised: 07/30/2011 Document Reviewed: 10/09/2010 [facility] Patient Information 2015 [place], [place]. This information is not intended to replace advice given to you by your health care provider. Make sure you discuss any questions you have with your health care provider.

## 2014-08-16 ENCOUNTER — Encounter: Payer: Self-pay | Admitting: Medical

## 2014-08-16 ENCOUNTER — Ambulatory Visit (INDEPENDENT_AMBULATORY_CARE_PROVIDER_SITE_OTHER): Payer: Medicare HMO | Admitting: Medical

## 2014-08-16 VITALS — BP 153/90 | HR 97 | Temp 98.3°F | Ht 62.0 in | Wt 177.5 lb

## 2014-08-16 DIAGNOSIS — R6883 Chills (without fever): Secondary | ICD-10-CM

## 2014-08-16 DIAGNOSIS — E785 Hyperlipidemia, unspecified: Secondary | ICD-10-CM

## 2014-08-16 DIAGNOSIS — T310 Burns involving less than 10% of body surface: Secondary | ICD-10-CM | POA: Diagnosis not present

## 2014-08-16 DIAGNOSIS — F411 Generalized anxiety disorder: Secondary | ICD-10-CM

## 2014-08-16 LAB — COMPREHENSIVE METABOLIC PANEL
ALT: 22 U/L (ref 0–35)
AST: 53 U/L — ABNORMAL HIGH (ref 0–37)
Albumin: 4.1 g/dL (ref 3.5–5.2)
Alkaline Phosphatase: 66 U/L (ref 39–117)
BILIRUBIN TOTAL: 0.6 mg/dL (ref 0.2–1.2)
BUN: 12 mg/dL (ref 6–23)
CO2: 28 meq/L (ref 19–32)
Calcium: 9.8 mg/dL (ref 8.4–10.5)
Chloride: 102 mEq/L (ref 96–112)
Creatinine, Ser: 0.72 mg/dL (ref 0.40–1.20)
GFR: 86.16 mL/min (ref >60.00)
Glucose, Bld: 112 mg/dL — ABNORMAL HIGH (ref 70–99)
Potassium: 3.6 mEq/L (ref 3.5–5.1)
SODIUM: 137 meq/L (ref 135–145)
TOTAL PROTEIN: 7.4 g/dL (ref 6.0–8.3)

## 2014-08-16 LAB — CBC WITH DIFFERENTIAL/PLATELET
BASOS PCT: 0.3 % (ref 0.0–3.0)
Basophils Absolute: 0 10*3/uL (ref 0.0–0.1)
EOS PCT: 0.6 % (ref 0.0–5.0)
Eosinophils Absolute: 0.1 10*3/uL (ref 0.0–0.7)
HCT: 41.3 % (ref 36.0–46.0)
Hemoglobin: 14.3 g/dL (ref 12.0–15.0)
LYMPHS PCT: 21.1 % (ref 12.0–46.0)
Lymphs Abs: 2.1 10*3/uL (ref 0.7–4.0)
MCHC: 34.7 g/dL (ref 30.0–36.0)
MCV: 95.4 fl (ref 78.0–100.0)
Monocytes Absolute: 0.7 10*3/uL (ref 0.1–1.0)
Monocytes Relative: 7.4 % (ref 3.0–12.0)
NEUTROS PCT: 70.6 % (ref 43.0–77.0)
Neutro Abs: 7 10*3/uL (ref 1.4–7.7)
PLATELETS: 407 10*3/uL — AB (ref 150.0–400.0)
RBC: 4.33 Mil/uL (ref 3.87–5.11)
RDW: 13.9 % (ref 11.5–15.5)
WBC: 9.9 10*3/uL (ref 4.0–10.5)

## 2014-08-16 LAB — LIPID PANEL
CHOLESTEROL: 256 mg/dL — AB (ref 0–200)
HDL: 70.8 mg/dL (ref >39.00)
NonHDL: 185.2
Total CHOL/HDL Ratio: 4
Triglycerides: 235 mg/dL — ABNORMAL HIGH (ref 0.0–149.0)
VLDL: 47 mg/dL — ABNORMAL HIGH (ref 0.0–40.0)

## 2014-08-16 LAB — LDL CHOLESTEROL, DIRECT: Direct LDL: 161 mg/dL

## 2014-08-16 MED ORDER — ALPRAZOLAM 1 MG PO TABS: ORAL_TABLET | ORAL | Status: DC

## 2014-08-16 MED ORDER — HYDROCODONE-ACETAMINOPHEN 5-325 MG PO TABS: 1.0000 | ORAL_TABLET | ORAL | Status: DC | PRN

## 2014-08-16 MED ORDER — FENOFIBRATE MICRONIZED 134 MG PO CAPS: 134.0000 mg | ORAL_CAPSULE | Freq: Every day | ORAL | Status: DC

## 2014-08-16 MED ORDER — SULFAMETHOXAZOLE-TRIMETHOPRIM 800-160 MG PO TABS: 1.0000 | ORAL_TABLET | Freq: Two times a day (BID) | ORAL | Status: DC

## 2014-08-16 MED ORDER — SILVER SULFADIAZINE 1 % EX CREA
1.0000 | TOPICAL_CREAM | Freq: Every day | CUTANEOUS | Status: DC
Start: 2014-08-16 — End: 2014-09-17
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
## 2014-08-16 NOTE — Progress Notes (Signed)
Pre visit review using our clinic review tool, if applicable. No additional management support is needed unless otherwise documented below in the visit note. 

## 2014-08-16 NOTE — Assessment & Plan Note (Addendum)
Continue the paxil. Will send in refills to pharmacy. Rx of xanax. Will provide refills of 2 months.   If running out of xanax sooner than I am prescribing . Then I will refer her to psychiatrist.(note did increase dose on xanax allowing 1/2 to 1 tab q 12 hours as needed severe anxiety.)
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
## 2014-08-17 ENCOUNTER — Other Ambulatory Visit (INDEPENDENT_AMBULATORY_CARE_PROVIDER_SITE_OTHER): Payer: Medicare HMO

## 2014-08-17 DIAGNOSIS — R739 Hyperglycemia, unspecified: Secondary | ICD-10-CM | POA: Diagnosis not present

## 2014-08-17 LAB — HEMOGLOBIN A1C: Hgb A1c MFr Bld: 4.9 % (ref 4.6–6.5)

## 2014-08-17 MED ORDER — FENOFIBRATE MICRONIZED 134 MG PO CAPS: 134.0000 mg | ORAL_CAPSULE | Freq: Every day | ORAL | Status: DC

## 2014-08-17 MED ORDER — SIMVASTATIN 20 MG PO TABS: 20.0000 mg | ORAL_TABLET | Freq: Every day | ORAL | Status: DC

## 2014-08-17 NOTE — Addendum Note (Signed)
Addended by: Anabel Halon on: 08/17/2014 06:43 AM   Modules accepted: Orders

## 2014-08-19 LAB — WOUND CULTURE
GRAM STAIN: NONE SEEN
GRAM STAIN: NONE SEEN
Gram Stain: NONE SEEN
ORGANISM ID, BACTERIA: NO GROWTH

## 2014-08-23 ENCOUNTER — Ambulatory Visit: Payer: Medicare HMO | Admitting: Medical

## 2014-08-23 DIAGNOSIS — Z0289 Encounter for other administrative examinations: Secondary | ICD-10-CM

## 2014-08-30 ENCOUNTER — Telehealth: Payer: Self-pay | Admitting: Medical

## 2014-08-30 ENCOUNTER — Encounter: Payer: Self-pay | Admitting: Medical

## 2014-08-30 NOTE — Telephone Encounter (Signed)
charge 

## 2014-08-30 NOTE — Telephone Encounter (Signed)
Pt was no show for follow up appt on 08/23/14- letter sent- charge?

## 2014-09-17 ENCOUNTER — Ambulatory Visit (INDEPENDENT_AMBULATORY_CARE_PROVIDER_SITE_OTHER): Payer: Medicare HMO | Admitting: Medical

## 2014-09-17 ENCOUNTER — Encounter: Payer: Self-pay | Admitting: Medical

## 2014-09-17 VITALS — BP 138/90 | HR 95 | Temp 97.9°F | Ht 62.0 in | Wt 183.8 lb

## 2014-09-17 DIAGNOSIS — F32A Depression, unspecified: Secondary | ICD-10-CM

## 2014-09-17 DIAGNOSIS — T310 Burns involving less than 10% of body surface: Secondary | ICD-10-CM

## 2014-09-17 DIAGNOSIS — M25562 Pain in left knee: Secondary | ICD-10-CM

## 2014-09-17 DIAGNOSIS — F329 Major depressive disorder, single episode, unspecified: Secondary | ICD-10-CM

## 2014-09-17 DIAGNOSIS — M25561 Pain in right knee: Secondary | ICD-10-CM | POA: Diagnosis not present

## 2014-09-17 DIAGNOSIS — F411 Generalized anxiety disorder: Secondary | ICD-10-CM

## 2014-09-17 MED ORDER — NAPROXEN 500 MG PO TABS: 500.0000 mg | ORAL_TABLET | Freq: Two times a day (BID) | ORAL | Status: DC

## 2014-09-17 MED ORDER — PAROXETINE HCL 40 MG PO TABS
40.0000 mg | ORAL_TABLET | ORAL | Status: DC
Start: 2014-09-17 — End: 2014-11-18

## 2014-09-17 NOTE — Progress Notes (Signed)
Pre visit review using our clinic review tool, if applicable. No additional management support is needed unless otherwise documented below in the visit note. 

## 2014-09-17 NOTE — Assessment & Plan Note (Signed)
History of knee pain. Currenlty taking naproxen 1 tab a day. Will refill her naproxen. If pain worsening advised pt will get knee xrays.
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
## 2014-09-17 NOTE — Assessment & Plan Note (Signed)
Stable/improved. Rx paxil
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
## 2014-09-17 NOTE — Assessment & Plan Note (Addendum)
Pt mood and depression is significantly improved since getting new puppy. She will stay on paxil. Pt anxiety reduced such that does not need xanax presently.   Will refill the paxil and write letter asking landlord/complex to make exception for her to have 3rd pet.  Note before I saw her I did check [facility] web site and she had not been getting any xanax from any other provider. I anticipated she would ask for xanax again. So I am pleased that puppy has helped her a lot.

## 2014-11-15 ENCOUNTER — Other Ambulatory Visit: Payer: Self-pay | Admitting: Medical

## 2014-11-16 ENCOUNTER — Encounter: Payer: Medicare HMO | Admitting: Medical

## 2014-11-16 NOTE — Progress Notes (Signed)
This encounter was created in error - please disregard.

## 2014-11-17 ENCOUNTER — Telehealth: Payer: Self-pay | Admitting: Medical

## 2014-11-17 NOTE — Telephone Encounter (Signed)
Pt was no show 11/16/14 9:15am, she called and stated that she could not make it back into town in time for appt, pt rescheduled for 11/18/14, charge?

## 2014-11-17 NOTE — Telephone Encounter (Signed)
I would say not charge but can you wait and check to see if she shows tomorrow. If she does not show then charge for both.

## 2014-11-18 ENCOUNTER — Ambulatory Visit (INDEPENDENT_AMBULATORY_CARE_PROVIDER_SITE_OTHER): Payer: Medicare HMO | Admitting: Medical

## 2014-11-18 ENCOUNTER — Encounter: Payer: Self-pay | Admitting: Medical

## 2014-11-18 VITALS — BP 120/80 | HR 105 | Temp 98.2°F | Ht 62.0 in | Wt 186.0 lb

## 2014-11-18 DIAGNOSIS — F411 Generalized anxiety disorder: Secondary | ICD-10-CM

## 2014-11-18 DIAGNOSIS — I10 Essential (primary) hypertension: Secondary | ICD-10-CM

## 2014-11-18 DIAGNOSIS — E785 Hyperlipidemia, unspecified: Secondary | ICD-10-CM | POA: Diagnosis not present

## 2014-11-18 DIAGNOSIS — M25561 Pain in right knee: Secondary | ICD-10-CM

## 2014-11-18 DIAGNOSIS — M25562 Pain in left knee: Secondary | ICD-10-CM

## 2014-11-18 MED ORDER — LISINOPRIL-HYDROCHLOROTHIAZIDE 10-12.5 MG PO TABS: 1.0000 | ORAL_TABLET | Freq: Every day | ORAL | Status: DC

## 2014-11-18 MED ORDER — ALPRAZOLAM 0.5 MG PO TABS: ORAL_TABLET | ORAL | Status: DC

## 2014-11-18 MED ORDER — SIMVASTATIN 20 MG PO TABS: 20.0000 mg | ORAL_TABLET | Freq: Every day | ORAL | Status: DC

## 2014-11-18 MED ORDER — NAPROXEN 500 MG PO TABS: 500.0000 mg | ORAL_TABLET | Freq: Two times a day (BID) | ORAL | Status: DC

## 2014-11-18 MED ORDER — FENOFIBRATE MICRONIZED 134 MG PO CAPS: 134.0000 mg | ORAL_CAPSULE | Freq: Every day | ORAL | Status: DC

## 2014-11-18 MED ORDER — PAROXETINE HCL 40 MG PO TABS
40.0000 mg | ORAL_TABLET | ORAL | Status: DC
Start: 2014-11-18 — End: 2015-02-21

## 2014-11-18 NOTE — Assessment & Plan Note (Signed)
Stable/improved. Rx paxil and xanax.

## 2014-11-18 NOTE — Patient Instructions (Addendum)
Essential hypertension Pt bp controlled today. When I rechecked her bp. Refill her zesoretic.  Knee pain, bilateral Refill her naprosyn. Arthritis and hx of surgeries.  Hyperlipidemia zocor for one month. Future lipid panel to do within 1 month.  Generalized anxiety disorder Stable/improved. Rx paxil and xanax.    Follow up in 3 months or as needed

## 2014-11-18 NOTE — Progress Notes (Signed)
Subjective:    Patient ID: Sarah Barnes, female    DOB: 07-14-1950, 64 y.o.   MRN: [id number]  HPI   Pt in for follow up for anxiety. Pt states she is doing well with with only half tab of xanax a day. Pt states has used xanax 30 tabs a month. Before seeing me she states was on qid. She also got puppy on past visit and that has helped with her anxiety.Pt ran out of paxil on 26th. Ran out xanax yesterday.   Pt bp is initially elevated. No cardio and no neuro signs or symptom. When I checked her bp was 120/80. Last cmp in march.   Pt lipids were elevated last visit. She ran out of statin 2 days ago. Pt is not fasting. Pt started zocor on last visit. Pt exercising in pool walking(Better on her knees as she has daily knee pain from known djd). No side effect from stating. She is not fasting today.      Review of Systems  Constitutional: Negative for fever, chills, diaphoresis, activity change and fatigue.  Respiratory: Negative for cough, chest tightness and shortness of breath.   Cardiovascular: Negative for chest pain, palpitations and leg swelling.  Gastrointestinal: Negative for nausea, vomiting and abdominal pain.  Musculoskeletal: Negative for neck pain and neck stiffness.       Knee pain.  Neurological: Negative for dizziness, tremors, seizures, syncope, facial asymmetry, speech difficulty, weakness, light-headedness, numbness and headaches.  Psychiatric/Behavioral: Negative for suicidal ideas, behavioral problems, confusion, dysphoric mood and agitation. The patient is nervous/anxious.        Much improved. On both paxil and xanax.  Some hx of depression but recently this has been controlled.   Past Medical History  Diagnosis Date  . Hypertension   . High cholesterol   . Hernia, hiatal   . Back pain   . Sciatica     History   Social History  . Marital Status: Single    Spouse Name: N/A  . Number of Children: N/A  . Years of Education: N/A   Occupational  History  . Not on file.   Social History Main Topics  . Smoking status: Current Every Day Smoker -- 2.00 packs/day    Types: Cigarettes  . Smokeless tobacco: Not on file  . Alcohol Use: 25.2 oz/week    42 Cans of beer per week  . Drug Use: No  . Sexual Activity: Not on file   Other Topics Concern  . Not on file   Social History Narrative    Past Surgical History  Procedure Laterality Date  . Back surgery    . Shoulder arthroscopy    . Cholecystectomy      Family History  Problem Relation Age of Onset  . Diabetes Mother   . Hyperlipidemia Mother   . Hypertension Mother   . Hypertension Father     No Known Allergies  Current Outpatient Prescriptions on File Prior to Visit  Medication Sig Dispense Refill  . amLODipine (NORVASC) 10 MG tablet Take 1 tablet (10 mg total) by mouth daily. 30 tablet 3  . omeprazole (PRILOSEC) 20 MG capsule TAKE 1 CAPSULE (20 MG TOTAL) BY MOUTH DAILY. 30 capsule 3  . ranitidine (ZANTAC) 150 MG tablet Take 1 tablet (150 mg total) by mouth 2 (two) times daily. 60 tablet 3   No current facility-administered medications on file prior to visit.    BP 150/92 mmHg  Pulse 105  Temp(Src) 98.2 F (36.8  C) (Oral)  Ht 5\' 2"  (1.575 m)  Wt 186 lb (84.369 kg)  BMI 34.01 kg/m2  SpO2 99%       Objective:   Physical Exam  General Mental Status- Alert. General Appearance- Not in acute distress.   Skin General: Color- Normal Color. Moisture- Normal Moisture.  Neck Carotid Arteries- Normal color. Moisture- Normal Moisture. No carotid bruits. No JVD.  Chest and Lung Exam Auscultation: Breath Sounds:-Normal.  Cardiovascular Auscultation:Rythm- Regular. Murmurs & Other Heart Sounds:Auscultation of the heart reveals- No Murmurs.  Abdomen Inspection:-Inspeection Normal. Palpation/Percussion:Note:No mass. Palpation and Percussion of the abdomen reveal- Non Tender, Non Distended + BS, no rebound or guarding.    Neurologic Cranial Nerve  exam:- CN III-XII intact(No nystagmus), symmetric smile. Strength:- 5/5 equal and symmetric strength both upper and lower extremities.      Assessment & Plan:

## 2014-11-18 NOTE — Assessment & Plan Note (Signed)
zocor for one month. Future lipid panel to do within 1 month.

## 2014-11-18 NOTE — Progress Notes (Signed)
Pre visit review using our clinic review tool, if applicable. No additional management support is needed unless otherwise documented below in the visit note. 

## 2014-11-18 NOTE — Assessment & Plan Note (Addendum)
Pt bp controlled today. When I rechecked her bp. Refill her zesoretic.  120/80 when I rechecked.

## 2014-11-18 NOTE — Assessment & Plan Note (Signed)
Refill her naprosyn. Arthritis and hx of surgeries.

## 2014-11-24 ENCOUNTER — Encounter (HOSPITAL_BASED_OUTPATIENT_CLINIC_OR_DEPARTMENT_OTHER): Payer: Self-pay

## 2014-11-24 ENCOUNTER — Emergency Department (HOSPITAL_BASED_OUTPATIENT_CLINIC_OR_DEPARTMENT_OTHER)
Admission: EM | Admit: 2014-11-24 | Discharge: 2014-11-25 | Disposition: A | Discharge status: Home / Self Care | Payer: Medicare HMO | Attending: Emergency Medicine | Admitting: Emergency Medicine

## 2014-11-24 DIAGNOSIS — F1210 Cannabis abuse, uncomplicated: Secondary | ICD-10-CM | POA: Insufficient documentation

## 2014-11-24 DIAGNOSIS — F10929 Alcohol use, unspecified with intoxication, unspecified: Secondary | ICD-10-CM

## 2014-11-24 DIAGNOSIS — I959 Hypotension, unspecified: Secondary | ICD-10-CM | POA: Insufficient documentation

## 2014-11-24 DIAGNOSIS — F10129 Alcohol abuse with intoxication, unspecified: Secondary | ICD-10-CM | POA: Insufficient documentation

## 2014-11-24 DIAGNOSIS — I10 Essential (primary) hypertension: Secondary | ICD-10-CM | POA: Insufficient documentation

## 2014-11-24 DIAGNOSIS — Z720 Tobacco use: Secondary | ICD-10-CM | POA: Insufficient documentation

## 2014-11-24 DIAGNOSIS — Z791 Long term (current) use of non-steroidal anti-inflammatories (NSAID): Secondary | ICD-10-CM | POA: Insufficient documentation

## 2014-11-24 DIAGNOSIS — Z79899 Other long term (current) drug therapy: Secondary | ICD-10-CM | POA: Insufficient documentation

## 2014-11-24 DIAGNOSIS — Z8719 Personal history of other diseases of the digestive system: Secondary | ICD-10-CM | POA: Diagnosis not present

## 2014-11-24 DIAGNOSIS — E860 Dehydration: Secondary | ICD-10-CM

## 2014-11-24 DIAGNOSIS — M5430 Sciatica, unspecified side: Secondary | ICD-10-CM | POA: Insufficient documentation

## 2014-11-24 DIAGNOSIS — E780 Pure hypercholesterolemia: Secondary | ICD-10-CM | POA: Insufficient documentation

## 2014-11-24 HISTORY — DX: Alcohol abuse, uncomplicated: F10.10

## 2014-11-24 NOTE — ED Notes (Signed)
Per EMS pts family concerned pt had been sitting by the pool drinking all day, was unable to wake her up; pt is a chronic beer drinking, nothing different; pt a/o x4, pt has no complaints; pt does has slurred speech from ETOH, family states nothing abnormal; negative for stroke screen

## 2014-11-25 ENCOUNTER — Encounter (HOSPITAL_BASED_OUTPATIENT_CLINIC_OR_DEPARTMENT_OTHER): Payer: Self-pay | Admitting: Emergency Medicine

## 2014-11-25 ENCOUNTER — Emergency Department (HOSPITAL_BASED_OUTPATIENT_CLINIC_OR_DEPARTMENT_OTHER): Payer: Medicare HMO

## 2014-11-25 LAB — COMPREHENSIVE METABOLIC PANEL
ALBUMIN: 3.8 g/dL (ref 3.5–5.0)
ALT: 16 U/L (ref 14–54)
ANION GAP: 16 — AB (ref 5–15)
AST: 28 U/L (ref 15–41)
Alkaline Phosphatase: 46 U/L (ref 38–126)
BUN: 12 mg/dL (ref 6–20)
CO2: 21 mmol/L — AB (ref 22–32)
CREATININE: 0.89 mg/dL (ref 0.44–1.00)
Calcium: 8.7 mg/dL — ABNORMAL LOW (ref 8.9–10.3)
Chloride: 103 mmol/L (ref 101–111)
GFR calc Af Amer: >60 mL/min (ref >60)
GFR calc non Af Amer: >60 mL/min (ref >60)
Glucose, Bld: 103 mg/dL — ABNORMAL HIGH (ref 65–99)
Potassium: 3.1 mmol/L — ABNORMAL LOW (ref 3.5–5.1)
SODIUM: 140 mmol/L (ref 135–145)
Total Bilirubin: 0.3 mg/dL (ref 0.3–1.2)
Total Protein: 6.7 g/dL (ref 6.5–8.1)

## 2014-11-25 LAB — URINALYSIS, ROUTINE W REFLEX MICROSCOPIC
Bilirubin Urine: NEGATIVE
GLUCOSE, UA: NEGATIVE mg/dL
Ketones, ur: NEGATIVE mg/dL
LEUKOCYTES UA: NEGATIVE
Nitrite: NEGATIVE
PH: 6.5 (ref 5.0–8.0)
Protein, ur: 30 mg/dL — AB
SPECIFIC GRAVITY, URINE: 1.006 (ref 1.005–1.030)
Urobilinogen, UA: 0.2 mg/dL (ref 0.0–1.0)

## 2014-11-25 LAB — CBC WITH DIFFERENTIAL/PLATELET
Basophils Absolute: 0 10*3/uL (ref 0.0–0.1)
Basophils Relative: 0 % (ref 0–1)
EOS ABS: 0.2 10*3/uL (ref 0.0–0.7)
Eosinophils Relative: 2 % (ref 0–5)
HCT: 42.9 % (ref 36.0–46.0)
Hemoglobin: 15 g/dL (ref 12.0–15.0)
LYMPHS ABS: 5.7 10*3/uL — AB (ref 0.7–4.0)
LYMPHS PCT: 60 % — AB (ref 12–46)
MCH: 33 pg (ref 26.0–34.0)
MCHC: 35 g/dL (ref 30.0–36.0)
MCV: 94.5 fL (ref 78.0–100.0)
Monocytes Absolute: 0.7 10*3/uL (ref 0.1–1.0)
Monocytes Relative: 7 % (ref 3–12)
NEUTROS ABS: 3 10*3/uL (ref 1.7–7.7)
NEUTROS PCT: 31 % — AB (ref 43–77)
PLATELETS: 304 10*3/uL (ref 150–400)
RBC: 4.54 MIL/uL (ref 3.87–5.11)
RDW: 12.2 % (ref 11.5–15.5)
WBC: 9.7 10*3/uL (ref 4.0–10.5)

## 2014-11-25 LAB — RAPID URINE DRUG SCREEN, HOSP PERFORMED
Amphetamines: NOT DETECTED
BARBITURATES: NOT DETECTED
Benzodiazepines: NOT DETECTED
Cocaine: NOT DETECTED
Opiates: NOT DETECTED
TETRAHYDROCANNABINOL: POSITIVE — AB

## 2014-11-25 LAB — URINE MICROSCOPIC-ADD ON

## 2014-11-25 LAB — ETHANOL: ALCOHOL ETHYL (B): 226 mg/dL — AB (ref <5)

## 2014-11-25 LAB — CBG MONITORING, ED: Glucose-Capillary: 91 mg/dL (ref 65–99)

## 2014-11-25 LAB — CK: CK TOTAL: 116 U/L (ref 38–234)

## 2014-11-25 LAB — TROPONIN I: Troponin I: <0.03 ng/mL (ref <0.031)

## 2014-11-25 MED ORDER — SODIUM CHLORIDE 0.9 % IV BOLUS (SEPSIS)
1000.0000 mL | Freq: Once | INTRAVENOUS | Status: AC
  Administered 2014-11-25 (×2): 1000 mL via INTRAVENOUS

## 2014-11-25 MED ORDER — SODIUM CHLORIDE 0.9 % IV BOLUS (SEPSIS)
1000.0000 mL | Freq: Once | INTRAVENOUS | Status: AC
  Administered 2014-11-25: 1000 mL via INTRAVENOUS

## 2014-11-25 MED ORDER — POTASSIUM CHLORIDE CRYS ER 20 MEQ PO TBCR
40.0000 meq | EXTENDED_RELEASE_TABLET | Freq: Once | ORAL | Status: AC
  Administered 2014-11-25: 40 meq via ORAL
  Filled 2014-11-25: qty 2

## 2014-11-25 NOTE — ED Notes (Signed)
Patient up and ambulatory to the restroom. Fully awake and without any complaints. The patient denies any pain. The patient is able to tolerate fluids well without any nausea. Patient talking on the phone with her sister and sister to come and get her at 45

## 2014-11-25 NOTE — ED Provider Notes (Addendum)
TIME SEEN: 12:10 AM  CHIEF COMPLAINT: Intoxication, unresponsiveness  HPI: Pt is a 64 y.o. female with history of hypertension, hyperlipidemia, alcohol abuse who presents emergency department with intoxication and an episode of unresponsiveness. Patient reports that she drank over 12 beers a day and several shots while sitting outside by the pool. Patient's sister reports that she has been drinking with the patient and states that at one point she sat down, appeared to fall asleep but was very difficult to arouse. Sister states that she was not sure if the patient was breathing so she started mouth-to-mouth. She states she also started chest compressions but never checked to see if the patient had a pulse. States she continued this until EMS arrived. EMS did not provide any history of this. Patient denies any pain. Specifically no chest pain or chest discomfort, shortness of breath. No vomiting or diarrhea. She is hypotensive in the emergency department. States she took her blood pressure medication yesterday morning. States her systolic blood pressure is normally in the 120s.  ROS: See HPI Constitutional: no fever  Eyes: no drainage  ENT: no runny nose   Cardiovascular:  no chest pain  Resp: no SOB  GI: no vomiting GU: no dysuria Integumentary: no rash  Allergy: no hives  Musculoskeletal: no leg swelling  Neurological: no slurred speech ROS otherwise negative  PAST MEDICAL HISTORY/PAST SURGICAL HISTORY:  Past Medical History  Diagnosis Date  . Hypertension   . High cholesterol   . Hernia, hiatal   . Back pain   . Sciatica   . ETOH abuse     MEDICATIONS:  Prior to Admission medications   Medication Sig Start Date End Date Taking? Authorizing Provider  ALPRAZolam Duanne Moron) 0.5 MG tablet 1 tab po q day as needed anxiety 11/18/14   Mackie Pai, PA-C  amLODipine (NORVASC) 10 MG tablet Take 1 tablet (10 mg total) by mouth daily. 05/05/14   Percell Miller Saguier, PA-C  fenofibrate micronized  (LOFIBRA) 134 MG capsule Take 1 capsule (134 mg total) by mouth daily before breakfast. 11/18/14   Mackie Pai, PA-C  lisinopril-hydrochlorothiazide (PRINZIDE,ZESTORETIC) 10-12.5 MG per tablet Take 1 tablet by mouth daily. 11/18/14   Percell Miller Saguier, PA-C  naproxen (NAPROSYN) 500 MG tablet Take 1 tablet (500 mg total) by mouth 2 (two) times daily with a meal. 11/18/14   Mackie Pai, PA-C  omeprazole (PRILOSEC) 20 MG capsule TAKE 1 CAPSULE (20 MG TOTAL) BY MOUTH DAILY. 11/15/14   Percell Miller Saguier, PA-C  PARoxetine (PAXIL) 40 MG tablet Take 1 tablet (40 mg total) by mouth every morning. 11/18/14   Percell Miller Saguier, PA-C  ranitidine (ZANTAC) 150 MG tablet Take 1 tablet (150 mg total) by mouth 2 (two) times daily. 06/15/14   Percell Miller Saguier, PA-C  simvastatin (ZOCOR) 20 MG tablet Take 1 tablet (20 mg total) by mouth daily. 11/18/14   Mackie Pai, PA-C    ALLERGIES:  No Known Allergies  SOCIAL HISTORY:  History  Substance Use Topics  . Smoking status: Current Every Day Smoker -- 2.00 packs/day    Types: Cigarettes  . Smokeless tobacco: Not on file  . Alcohol Use: 25.2 oz/week    42 Cans of beer per week    FAMILY HISTORY: Family History  Problem Relation Age of Onset  . Diabetes Mother   . Hyperlipidemia Mother   . Hypertension Mother   . Hypertension Father     EXAM: Pulse 62  Temp(Src) 98.2 F (36.8 C) (Oral)  Resp 18  Ht 5\' 3"  (  1.6 m)  Wt 180 lb (81.647 kg)  BMI 31.89 kg/m2  SpO2 100% CONSTITUTIONAL: Alert and oriented and responds appropriately to questions. Chronically ill-appearing, appears older than stated age, appears intoxicated and smells of alcohol HEAD: Normocephalic EYES: Conjunctivae clear, PERRL ENT: normal nose; no rhinorrhea; moist mucous membranes; pharynx without lesions noted NECK: Supple, no meningismus, no LAD  CARD: RRR; S1 and S2 appreciated; no murmurs, no clicks, no rubs, no gallops RESP: Normal chest excursion without splinting or tachypnea; breath  sounds clear and equal bilaterally; no wheezes, no rhonchi, no rales, no hypoxia or respiratory distress, speaking full sentences ABD/GI: Normal bowel sounds; non-distended; soft, non-tender, no rebound, no guarding, no peritoneal signs BACK:  The back appears normal and is non-tender to palpation, there is no CVA tenderness EXT: Normal ROM in all joints; non-tender to palpation; no edema; normal capillary refill; no cyanosis, no calf tenderness or swelling    SKIN: Normal color for age and race; warm NEURO: Moves all extremities equally, sensation to light touch intact diffusely, cranial nerves II through XII intact PSYCH: The patient's mood and manner are appropriate. Grooming and personal hygiene are appropriate.  MEDICAL DECISION MAKING: Patient here with hypotension and episode of unresponsiveness after drinking alcohol and being in the sun today. Hypotension maybe secondary to dehydration. 2 peripheral IV started with fluids wide open. Her sister who is also intoxicated states that during this episode of unresponsiveness she started chest compressions and mouth-to-mouth resuscitation but it is unclear if there is truly any apnea or pulselessness. Patient has no current complaints. We'll obtain labs including troponin, urine, chest x-ray. We'll continue IV hydration.  ED PROGRESS: Patient's labs show potassium of 3.1. Will replace. Bicarbonate slightly low at 21 and slightly elevated anion gap likely secondary to dehydration. Ethanol level is 226. First troponin is negative. Chest x-ray clear. Blood pressure has improved significantly after 4 L of IV fluids. Have recommended admission for observation, IV hydration the patient refuses. We'll continue IV hydration and repeat second troponin. If she continues to do well and is clinically sober, will discharge home.   5:00 AM  Pt's BP is 120/73 and is stable sitting up as well.  She has been able to ambulate, tolerate po.  Brother is coming to pick her  up.  Second troponin negative.  CK normal.  UDS is + for THC.  She has rcvd 5 L IV fin ED.  Has some low sats documented but this was with sleeping and quickly come up into 90s when awake on RA.  She denies feeling SOB.  D/w her return precautions, increasing fluid intake and no ETOH.  She verbalized understanding and comfortable with plan.  She refuses admission.   6:00 AM  Pt sitting at nursing station, talking, laughing, drinking coffee.  Waiting for brother to pick her up from the ED.  BP has been stable for 2 hours with SBP > 100.  Appears clinically sober.    EKG Interpretation  Date/Time:  Thursday November 25 2014 00:23:23 EDT Ventricular Rate:  63 PR Interval:  182 QRS Duration: 86 QT Interval:  468 QTC Calculation: 478 R Axis:   14 Text Interpretation:  Normal sinus rhythm Possible Lateral infarct , age undetermined Abnormal ECG No old tracing to compare Confirmed by Rito Lecomte,  DO, Cala Kruckenberg ([id number]) on 11/25/2014 12:35:38 AM      CRITICAL CARE Performed by: Nyra Jabs   Total critical care time: 45 minutes  Critical care time was exclusive of separately  billable procedures and treating other patients.  Critical care was necessary to treat or prevent imminent or life-threatening deterioration.  Critical care was time spent personally by me on the following activities: development of treatment plan with patient and/or surrogate as well as nursing, discussions with consultants, evaluation of patient's response to treatment, examination of patient, obtaining history from patient or surrogate, ordering and performing treatments and interventions, ordering and review of laboratory studies, ordering and review of radiographic studies, pulse oximetry and re-evaluation of patient's condition.    [place], DO 11/25/14 [place], DO 11/25/14 [place], DO 11/25/14 [phone]

## 2014-11-25 NOTE — ED Notes (Signed)
Patient reports that she drank a 12 pack at home today starting at about 11 am and leading up until her sister found her unresponsive at home. Per the patients sister the patient "needed CPR with chest compressions and breathing help until EMS arrived" - No report of this to the nurse who got report for EMS. The patient is now fully awake and alert, however does not remember any of the events that transpired.

## 2014-11-25 NOTE — Discharge Instructions (Signed)
Please increase your water intake over the next several days and stay out of the sun, rest and avoid caffeine and alcohol as these can dehydrate you more.  Alcohol Intoxication Alcohol intoxication occurs when the amount of alcohol that a person has consumed impairs his or her ability to mentally and physically function. Alcohol directly impairs the normal chemical activity of the brain. Drinking large amounts of alcohol can lead to changes in mental function and behavior, and it can cause many physical effects that can be harmful.  Alcohol intoxication can range in severity from mild to very severe. Various factors can affect the level of intoxication that occurs, such as the person's age, gender, weight, frequency of alcohol consumption, and the presence of other medical conditions (such as diabetes, seizures, or heart conditions). Dangerous levels of alcohol intoxication may occur when people drink large amounts of alcohol in a short period (binge drinking). Alcohol can also be especially dangerous when combined with certain prescription medicines or "recreational" drugs. SIGNS AND SYMPTOMS Some common signs and symptoms of mild alcohol intoxication include:  Loss of coordination.  Changes in mood and behavior.  Impaired judgment.  Slurred speech. As alcohol intoxication progresses to more severe levels, other signs and symptoms will appear. These may include:  Vomiting.  Confusion and impaired memory.  Slowed breathing.  Seizures.  Loss of consciousness. DIAGNOSIS  Your health care provider will take a medical history and perform a physical exam. You will be asked about the amount and type of alcohol you have consumed. Blood tests will be done to measure the concentration of alcohol in your blood. In many places, your blood alcohol level must be lower than 80 mg/dL (0.08%) to legally drive. However, many dangerous effects of alcohol can occur at much lower levels.  TREATMENT  People  with alcohol intoxication often do not require treatment. Most of the effects of alcohol intoxication are temporary, and they go away as the alcohol naturally leaves the body. Your health care provider will monitor your condition until you are stable enough to go home. Fluids are sometimes given through an IV access tube to help prevent dehydration.  HOME CARE INSTRUCTIONS  Do not drive after drinking alcohol.  Stay hydrated. Drink enough water and fluids to keep your urine clear or pale yellow. Avoid caffeine.   Only take over-the-counter or prescription medicines as directed by your health care provider.  SEEK MEDICAL CARE IF:   You have persistent vomiting.   You do not feel better after a few days.  You have frequent alcohol intoxication. Your health care provider can help determine if you should see a substance use treatment counselor. SEEK IMMEDIATE MEDICAL CARE IF:   You become shaky or tremble when you try to stop drinking.   You shake uncontrollably (seizure).   You throw up (vomit) blood. This may be bright red or may look like black coffee grounds.   You have blood in your stool. This may be bright red or may appear as a black, tarry, bad smelling stool.   You become lightheaded or faint.  MAKE SURE YOU:   Understand these instructions.  Will watch your condition.  Will get help right away if you are not doing well or get worse. Document Released: 02/14/2005 Document Revised: 01/07/2013 Document Reviewed: 10/10/2012 [facility] Patient Information 2015 [place], [place]. This information is not intended to replace advice given to you by your health care provider. Make sure you discuss any questions you have with your health  care provider.  Dehydration, Adult Dehydration is when you lose more fluids from the body than you take in. Vital organs like the kidneys, brain, and heart cannot function without a proper amount of fluids and salt. Any loss of fluids from the  body can cause dehydration.  CAUSES   Vomiting.  Diarrhea.  Excessive sweating.  Excessive urine output.  Fever. SYMPTOMS  Mild dehydration  Thirst.  Dry lips.  Slightly dry mouth. Moderate dehydration  Very dry mouth.  Sunken eyes.  Skin does not bounce back quickly when lightly pinched and released.  Dark urine and decreased urine production.  Decreased tear production.  Headache. Severe dehydration  Very dry mouth.  Extreme thirst.  Rapid, weak pulse (more than 100 beats per minute at rest).  Cold hands and feet.  Not able to sweat in spite of heat and temperature.  Rapid breathing.  Blue lips.  Confusion and lethargy.  Difficulty being awakened.  Minimal urine production.  No tears. DIAGNOSIS  Your caregiver will diagnose dehydration based on your symptoms and your exam. Blood and urine tests will help confirm the diagnosis. The diagnostic evaluation should also identify the cause of dehydration. TREATMENT  Treatment of mild or moderate dehydration can often be done at home by increasing the amount of fluids that you drink. It is best to drink small amounts of fluid more often. Drinking too much at one time can make vomiting worse. Refer to the home care instructions below. Severe dehydration needs to be treated at the hospital where you will probably be given intravenous (IV) fluids that contain water and electrolytes. HOME CARE INSTRUCTIONS   Ask your caregiver about specific rehydration instructions.  Drink enough fluids to keep your urine clear or pale yellow.  Drink small amounts frequently if you have nausea and vomiting.  Eat as you normally do.  Avoid:  Foods or drinks high in sugar.  Carbonated drinks.  Juice.  Extremely hot or cold fluids.  Drinks with caffeine.  Fatty, greasy foods.  Alcohol.  Tobacco.  Overeating.  Gelatin desserts.  Wash your hands well to avoid spreading bacteria and viruses.  Only take  over-the-counter or prescription medicines for pain, discomfort, or fever as directed by your caregiver.  Ask your caregiver if you should continue all prescribed and over-the-counter medicines.  Keep all follow-up appointments with your caregiver. SEEK MEDICAL CARE IF:  You have abdominal pain and it increases or stays in one area (localizes).  You have a rash, stiff neck, or severe headache.  You are irritable, sleepy, or difficult to awaken.  You are weak, dizzy, or extremely thirsty. SEEK IMMEDIATE MEDICAL CARE IF:   You are unable to keep fluids down or you get worse despite treatment.  You have frequent episodes of vomiting or diarrhea.  You have blood or green matter (bile) in your vomit.  You have blood in your stool or your stool looks black and tarry.  You have not urinated in 6 to 8 hours, or you have only urinated a small amount of very dark urine.  You have a fever.  You faint. MAKE SURE YOU:   Understand these instructions.  Will watch your condition.  Will get help right away if you are not doing well or get worse. Document Released: 05/07/2005 Document Revised: 07/30/2011 Document Reviewed: 12/25/2010 [facility] Patient Information 2015 [place], [place]. This information is not intended to replace advice given to you by your health care provider. Make sure you discuss any questions you have  with your health care provider.  Hypotension As your heart beats, it forces blood through your arteries. This force is your blood pressure. If your blood pressure is too low for you to go about your normal activities or to support the organs of your body, you have hypotension. Hypotension is also referred to as low blood pressure. When your blood pressure becomes too low, you may not get enough blood to your brain. As a result, you may feel weak, feel lightheaded, or develop a rapid heart rate. In a more severe case, you may faint. CAUSES Various conditions can cause  hypotension. These include:  Blood loss.  Dehydration.  Heart or endocrine problems.  Pregnancy.  Severe infection.  Not having a well-balanced diet filled with needed nutrients.  Severe allergic reactions (anaphylaxis). Some medicines, such as blood pressure medicine or water pills (diuretics), may lower your blood pressure below normal. Sometimes taking too much medicine or taking medicine not as directed can cause hypotension. TREATMENT  Hospitalization is sometimes required for hypotension if fluid or blood replacement is needed, if time is needed for medicines to wear off, or if further monitoring is needed. Treatment might include changing your diet, changing your medicines (including medicines aimed at raising your blood pressure), and use of support stockings. HOME CARE INSTRUCTIONS   Drink enough fluids to keep your urine clear or pale yellow.  Take your medicines as directed by your health care provider.  Get up slowly from reclining or sitting positions. This gives your blood pressure a chance to adjust.  Wear support stockings as directed by your health care provider.  Maintain a healthy diet by including nutritious food, such as fruits, vegetables, nuts, whole grains, and lean meats. SEEK MEDICAL CARE IF:  You have vomiting or diarrhea.  You have a fever for more than 2-3 days.  You feel more thirsty than usual.  You feel weak and tired. SEEK IMMEDIATE MEDICAL CARE IF:   You have chest pain or a fast or irregular heartbeat.  You have a loss of feeling in some part of your body, or you lose movement in your arms or legs.  You have trouble speaking.  You become sweaty or feel lightheaded.  You faint. MAKE SURE YOU:   Understand these instructions.  Will watch your condition.  Will get help right away if you are not doing well or get worse. Document Released: 05/07/2005 Document Revised: 02/25/2013 Document Reviewed: 11/07/2012 [facility] Patient  Information 2015 [place], [place]. This information is not intended to replace advice given to you by your health care provider. Make sure you discuss any questions you have with your health care provider.

## 2014-11-25 NOTE — ED Notes (Signed)
Went in to triage the patient completely. Patient is alert and oriented x4. The patients Bp taken in both arms and very low ( see vitals) manual BP taken and coorelates. MD aware and 2 IVs started with fluids wide open.

## 2014-12-09 ENCOUNTER — Encounter: Payer: Self-pay | Admitting: Medical

## 2014-12-09 ENCOUNTER — Ambulatory Visit (INDEPENDENT_AMBULATORY_CARE_PROVIDER_SITE_OTHER): Payer: Medicare HMO | Admitting: Medical

## 2014-12-09 VITALS — BP 137/83 | HR 84 | Temp 97.9°F | Ht 62.0 in | Wt 192.6 lb

## 2014-12-09 DIAGNOSIS — H60392 Other infective otitis externa, left ear: Secondary | ICD-10-CM

## 2014-12-09 DIAGNOSIS — J029 Acute pharyngitis, unspecified: Secondary | ICD-10-CM

## 2014-12-09 DIAGNOSIS — H6502 Acute serous otitis media, left ear: Secondary | ICD-10-CM

## 2014-12-09 DIAGNOSIS — J0100 Acute maxillary sinusitis, unspecified: Secondary | ICD-10-CM | POA: Diagnosis not present

## 2014-12-09 LAB — POCT RAPID STREP A (OFFICE): Rapid Strep A Screen: NEGATIVE

## 2014-12-09 MED ORDER — NEOMYCIN-POLYMYXIN-HC 3.5-10000-1 OT SOLN: 3.0000 [drp] | Freq: Four times a day (QID) | OTIC | Status: DC

## 2014-12-09 MED ORDER — CEFTRIAXONE SODIUM 1 G IJ SOLR
1.0000 g | Freq: Once | INTRAMUSCULAR | Status: AC
  Administered 2014-12-09: 1 g via INTRAMUSCULAR

## 2014-12-09 MED ORDER — AMOXICILLIN-POT CLAVULANATE 875-125 MG PO TABS: 1.0000 | ORAL_TABLET | Freq: Two times a day (BID) | ORAL | Status: DC

## 2014-12-09 NOTE — Progress Notes (Signed)
Subjective:    Patient ID: Sarah Barnes, female    DOB: 1950/05/22, 63 y.o.   MRN: [id number]  HPI  Pt in with ear pain and sore throat. Pain for 2 days. Pain on swallowing and radiates to her left ear. No fever, no sweats. Some chills but sister keeps house cold. No myalgias. Pt has some exposure to young kids.Pt does swim a lot. She does submerge her head.    Review of Systems  Constitutional: Negative for fever, chills, diaphoresis, activity change and fatigue.  HENT: Positive for ear pain, sinus pressure and sore throat. Negative for congestion, nosebleeds, postnasal drip, rhinorrhea and tinnitus.   Respiratory: Negative for cough, chest tightness and shortness of breath.   Cardiovascular: Negative for chest pain, palpitations and leg swelling.  Gastrointestinal: Negative for nausea, vomiting and abdominal pain.  Musculoskeletal: Negative for back pain, neck pain and neck stiffness.  Neurological: Negative for dizziness, tremors, seizures, syncope, facial asymmetry, speech difficulty, weakness, light-headedness, numbness and headaches.  Psychiatric/Behavioral: Negative for behavioral problems, confusion and agitation. The patient is not nervous/anxious.     Past Medical History  Diagnosis Date  . Hypertension   . High cholesterol   . Hernia, hiatal   . Back pain   . Sciatica   . ETOH abuse     History   Social History  . Marital Status: Single    Spouse Name: N/A  . Number of Children: N/A  . Years of Education: N/A   Occupational History  . Not on file.   Social History Main Topics  . Smoking status: Current Every Day Smoker -- 2.00 packs/day    Types: Cigarettes  . Smokeless tobacco: Not on file  . Alcohol Use: 25.2 oz/week    42 Cans of beer per week  . Drug Use: No  . Sexual Activity: Not on file   Other Topics Concern  . Not on file   Social History Narrative    Past Surgical History  Procedure Laterality Date  . Back surgery    . Shoulder  arthroscopy    . Cholecystectomy      Family History  Problem Relation Age of Onset  . Diabetes Mother   . Hyperlipidemia Mother   . Hypertension Mother   . Hypertension Father     No Known Allergies  Current Outpatient Prescriptions on File Prior to Visit  Medication Sig Dispense Refill  . ALPRAZolam (XANAX) 0.5 MG tablet 1 tab po q day as needed anxiety 30 tablet 2  . amLODipine (NORVASC) 10 MG tablet Take 1 tablet (10 mg total) by mouth daily. 30 tablet 3  . fenofibrate micronized (LOFIBRA) 134 MG capsule Take 1 capsule (134 mg total) by mouth daily before breakfast. 30 capsule 2  . lisinopril-hydrochlorothiazide (PRINZIDE,ZESTORETIC) 10-12.5 MG per tablet Take 1 tablet by mouth daily. 30 tablet 3  . naproxen (NAPROSYN) 500 MG tablet Take 1 tablet (500 mg total) by mouth 2 (two) times daily with a meal. 60 tablet 1  . PARoxetine (PAXIL) 40 MG tablet Take 1 tablet (40 mg total) by mouth every morning. 30 tablet 2  . ranitidine (ZANTAC) 150 MG tablet Take 1 tablet (150 mg total) by mouth 2 (two) times daily. 60 tablet 3  . simvastatin (ZOCOR) 20 MG tablet Take 1 tablet (20 mg total) by mouth daily. 30 tablet 0  . omeprazole (PRILOSEC) 20 MG capsule TAKE 1 CAPSULE (20 MG TOTAL) BY MOUTH DAILY. 30 capsule 3   No current facility-administered  medications on file prior to visit.    BP 137/83 mmHg  Pulse 84  Temp(Src) 97.9 F (36.6 C) (Oral)  Ht 5\' 2"  (1.575 m)  Wt 192 lb 9.6 oz (87.363 kg)  BMI 35.22 kg/m2  SpO2 98%       Objective:   Physical Exam   General  Mental Status - Alert. General Appearance - Well groomed. Not in acute distress.  Skin Rashes- No Rashes.  HEENT Head- Normal. Ear Auditory Canal - Left- moderate swollen and tragal tender(faint mastoid tenderness)Right - Normal.Tympanic Membrane- Left- mild red. Right- Normal. Eye Sclera/Conjunctiva- Left- Normal. Right- Normal. Nose & Sinuses Nasal Mucosa- Left-  Not boggy or Congested. Right-  Not  boggy  or Congested. Mild left maxillary  pressure Mouth & Throat Lips: Upper Lip- Normal: no dryness, cracking, pallor, cyanosis, or vesicular eruption. Lower Lip-Normal: no dryness, cracking, pallor, cyanosis or vesicular eruption. Buccal Mucosa- Bilateral- No Aphthous ulcers. Oropharynx- No Discharge or Erythema. Tonsils: Characteristics- Bilateral- No Erythema or Congestion. Size/Enlargement- Bilateral- No enlargement. Discharge- bilateral-None.  Neck Neck- Supple. No Masses.   Chest and Lung Exam Auscultation: Breath Sounds:- even and unlabored.  Cardiovascular Auscultation:Rythm- Regular, rate and rhythm. Murmurs & Other Heart Sounds:Ausculatation of the heart reveal- No Murmurs.  Lymphatic Head & Neck General Head & Neck Lymphatics: Bilateral: Description- No Localized lymphadenopathy.   Abdomen Inspection:-Inspeection Normal. Palpation/Percussion:Note:No mass. Palpation and Percussion of the abdomen reveal- Non Tender, Non Distended + BS, no rebound or guarding.    Neurologic Cranial Nerve exam:- CN III-XII intact(No nystagmus), symmetric smile. Strength:- 5/5 equal and symmetric strength both upper and lower extremities.     Assessment & Plan:  Dx for visit om and oe. May have sinusitis as well. Rapid strep was negative.  Rocephin 1 gram im(with faint left mastoid tender sodue want to give IM med today). If mastoid area worsens let us know/return to clinic.  Rx augmentin. Rx cortisporin drops.  Follow up in 7 days or as needed  Recommend no swimming/submerging head for one week.

## 2014-12-09 NOTE — Progress Notes (Signed)
Pre visit review using our clinic review tool, if applicable. No additional management support is needed unless otherwise documented below in the visit note. 
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
## 2014-12-17 ENCOUNTER — Other Ambulatory Visit (INDEPENDENT_AMBULATORY_CARE_PROVIDER_SITE_OTHER): Payer: Medicare HMO

## 2014-12-17 ENCOUNTER — Other Ambulatory Visit: Payer: Medicare HMO

## 2014-12-17 DIAGNOSIS — E785 Hyperlipidemia, unspecified: Secondary | ICD-10-CM | POA: Diagnosis not present

## 2014-12-17 DIAGNOSIS — I10 Essential (primary) hypertension: Secondary | ICD-10-CM

## 2014-12-17 LAB — LIPID PANEL
Cholesterol: 226 mg/dL — ABNORMAL HIGH (ref 0–200)
HDL: 48.8 mg/dL (ref >39.00)
Total CHOL/HDL Ratio: 5

## 2014-12-17 LAB — COMPREHENSIVE METABOLIC PANEL
ALBUMIN: 4.3 g/dL (ref 3.5–5.2)
ALT: 26 U/L (ref 0–35)
AST: 23 U/L (ref 0–37)
Alkaline Phosphatase: 67 U/L (ref 39–117)
BUN: 13 mg/dL (ref 6–23)
CO2: 25 mEq/L (ref 19–32)
Calcium: 9.6 mg/dL (ref 8.4–10.5)
Chloride: 104 mEq/L (ref 96–112)
Creatinine, Ser: 0.71 mg/dL (ref 0.40–1.20)
GFR: 87.48 mL/min (ref >60.00)
GLUCOSE: 113 mg/dL — AB (ref 70–99)
Potassium: 3.3 mEq/L — ABNORMAL LOW (ref 3.5–5.1)
Sodium: 141 mEq/L (ref 135–145)
TOTAL PROTEIN: 7.4 g/dL (ref 6.0–8.3)
Total Bilirubin: 0.6 mg/dL (ref 0.2–1.2)

## 2014-12-17 LAB — LDL CHOLESTEROL, DIRECT: Direct LDL: 96 mg/dL

## 2014-12-23 ENCOUNTER — Ambulatory Visit (INDEPENDENT_AMBULATORY_CARE_PROVIDER_SITE_OTHER): Payer: Medicare HMO | Admitting: Medical

## 2014-12-23 ENCOUNTER — Encounter: Payer: Self-pay | Admitting: Medical

## 2014-12-23 ENCOUNTER — Ambulatory Visit: Payer: Medicare HMO | Admitting: Medical

## 2014-12-23 VITALS — BP 135/80 | HR 87 | Temp 98.6°F | Ht 62.0 in | Wt 194.8 lb

## 2014-12-23 DIAGNOSIS — E781 Pure hyperglyceridemia: Secondary | ICD-10-CM

## 2014-12-23 DIAGNOSIS — R739 Hyperglycemia, unspecified: Secondary | ICD-10-CM | POA: Diagnosis not present

## 2014-12-23 LAB — AMYLASE: AMYLASE: 36 U/L (ref 27–131)

## 2014-12-23 LAB — HEMOGLOBIN A1C: Hgb A1c MFr Bld: 5 % (ref 4.6–6.5)

## 2014-12-23 LAB — LIPASE: LIPASE: 56 U/L (ref 11.0–59.0)

## 2014-12-23 LAB — TRIGLYCERIDES: Triglycerides: 351 mg/dL — ABNORMAL HIGH (ref 0.0–149.0)

## 2014-12-23 NOTE — Patient Instructions (Signed)
For your recent triglyerides over 500 continue fenofibrate and statin. Low cholesterol and low fat diet. Continue to exercise/walk.  Since you drank heavily on the day went to ED and continue to drink 2 beers a day will get amylase and lipase. But please stop alcohol use.  For you mild high bs recently will get a1-c. By chance if your a1-c is elevated this could be cause of recent elevated triglycerides. Advsise low sugar diet.  If in event any RUQ pain occurs then be seen here of ED.  We will call you on lab results.   Appointment follow up to be determined after lab review.

## 2014-12-23 NOTE — Progress Notes (Signed)
Subjective:    Patient ID: Sarah Barnes, female    DOB: 1951-03-10, 64 y.o.   MRN: [id number]  HPI   Pt in for follow up. Had elevated triglycerides since I last saw her. Pt had excess of 500. Despite use of simvastatin and use of zocor. Pt had some mild high sugar on last exam.But a1-c in march was good.  Pt has no nausea, no vomiting, and no abdominal pain. Pt is drinking a couple of beers a day.  Pt is fasting today.     Review of Systems  Constitutional: Negative for fever, chills and fatigue.  Respiratory: Negative for choking, chest tightness and wheezing.   Cardiovascular: Negative for chest pain and palpitations.  Gastrointestinal: Negative for nausea, abdominal pain, diarrhea and constipation.  Endocrine: Negative for polydipsia, polyphagia and polyuria.  Musculoskeletal: Negative for back pain.  Hematological: Negative for adenopathy. Does not bruise/bleed easily.  Psychiatric/Behavioral:       None reported.    Past Medical History  Diagnosis Date  . Hypertension   . High cholesterol   . Hernia, hiatal   . Back pain   . Sciatica   . ETOH abuse     History   Social History  . Marital Status: Single    Spouse Name: N/A  . Number of Children: N/A  . Years of Education: N/A   Occupational History  . Not on file.   Social History Main Topics  . Smoking status: Current Every Day Smoker -- 2.00 packs/day    Types: Cigarettes  . Smokeless tobacco: Not on file  . Alcohol Use: 25.2 oz/week    42 Cans of beer per week  . Drug Use: No  . Sexual Activity: Not on file   Other Topics Concern  . Not on file   Social History Narrative    Past Surgical History  Procedure Laterality Date  . Back surgery    . Shoulder arthroscopy    . Cholecystectomy      Family History  Problem Relation Age of Onset  . Diabetes Mother   . Hyperlipidemia Mother   . Hypertension Mother   . Hypertension Father     No Known Allergies  Current Outpatient  Prescriptions on File Prior to Visit  Medication Sig Dispense Refill  . ALPRAZolam (XANAX) 0.5 MG tablet 1 tab po q day as needed anxiety 30 tablet 2  . amLODipine (NORVASC) 10 MG tablet Take 1 tablet (10 mg total) by mouth daily. 30 tablet 3  . amoxicillin-clavulanate (AUGMENTIN) 875-125 MG per tablet Take 1 tablet by mouth 2 (two) times daily. 20 tablet 0  . fenofibrate micronized (LOFIBRA) 134 MG capsule Take 1 capsule (134 mg total) by mouth daily before breakfast. 30 capsule 2  . lisinopril-hydrochlorothiazide (PRINZIDE,ZESTORETIC) 10-12.5 MG per tablet Take 1 tablet by mouth daily. 30 tablet 3  . naproxen (NAPROSYN) 500 MG tablet Take 1 tablet (500 mg total) by mouth 2 (two) times daily with a meal. 60 tablet 1  . neomycin-polymyxin-hydrocortisone (CORTISPORIN) otic solution Place 3 drops into the left ear 4 (four) times daily. 10 mL 0  . omeprazole (PRILOSEC) 20 MG capsule TAKE 1 CAPSULE (20 MG TOTAL) BY MOUTH DAILY. 30 capsule 3  . PARoxetine (PAXIL) 40 MG tablet Take 1 tablet (40 mg total) by mouth every morning. 30 tablet 2  . ranitidine (ZANTAC) 150 MG tablet Take 1 tablet (150 mg total) by mouth 2 (two) times daily. 60 tablet 3  . simvastatin (ZOCOR)  20 MG tablet Take 1 tablet (20 mg total) by mouth daily. 30 tablet 0   No current facility-administered medications on file prior to visit.    BP 135/80 mmHg  Pulse 87  Temp(Src) 98.6 F (37 C) (Oral)  Ht 5\' 2"  (1.575 m)  Wt 194 lb 12.8 oz (88.361 kg)  BMI 35.62 kg/m2  SpO2 96%       Objective:   Physical Exam  General Appearance- Not in acute distress.  HEENT Eyes- Scleraeral/Conjuntiva-bilat- Not Yellow. Mouth & Throat- Normal.  Chest and Lung Exam Auscultation: Breath sounds:-Normal. Adventitious sounds:- No Adventitious sounds.  Cardiovascular Auscultation:Rythm - Regular. Heart Sounds -Normal heart sounds.  Abdomen Inspection:-Inspection Normal.  Palpation/Perucssion: Palpation and Percussion of the  abdomen reveal- Non Tender, No Rebound tenderness, No rigidity(Guarding) and No Palpable abdominal masses.  Liver:-Normal.  Spleen:- Normal.    Neurologic Cranial Nerve exam:- CN III-XII intact(No nystagmus), symmetric smile. Strength:- 5/5 equal and symmetric strength both upper and lower extremities.      Assessment & Plan:    For your recent triglyerides over 500 continue fenofibrate and statin. Low cholesterol and low fat diet. Continue to exercise/walk.  Since you drank heavily on the day went to ED and continue to drink 2 beers a day will get amylase and lipase. But please stop alcohol use.  For you mild high bs recently will get a1-c. By chance if your a1-c is elevated this could be cause of recent elevated triglycerides. Advsise low sugar diet.  If in event any RUQ pain occurs then be seen here of ED.  We will call you on lab results.   Appointment follow up to be determined after lab review.

## 2014-12-23 NOTE — Progress Notes (Signed)
Pre visit review using our clinic review tool, if applicable. No additional management support is needed unless otherwise documented below in the visit note. 

## 2015-01-17 ENCOUNTER — Other Ambulatory Visit: Payer: Self-pay | Admitting: Medical

## 2015-01-17 NOTE — Telephone Encounter (Signed)
Refill pt zantac.

## 2015-01-19 ENCOUNTER — Other Ambulatory Visit: Payer: Self-pay

## 2015-01-19 MED ORDER — SIMVASTATIN 20 MG PO TABS: 20.0000 mg | ORAL_TABLET | Freq: Every day | ORAL | Status: DC

## 2015-01-19 NOTE — Telephone Encounter (Signed)
[facility] is sending request for Simvastatin 20mg  #30, last refill 12-17-14. Pt last seen 12-23-2014. Please advise.

## 2015-01-19 NOTE — Telephone Encounter (Signed)
I refilled her simvistatin. Please remind her to come in end of October around 29 th for repeat lipid panel. Her triglycerides were real high on last check. Want to know the what level they are.

## 2015-01-20 NOTE — Telephone Encounter (Signed)
Future appt scheduled.

## 2015-01-20 NOTE — Telephone Encounter (Signed)
Pt is due for follow up around 02/16/15. Please call pt to schedule an appointment.

## 2015-02-02 ENCOUNTER — Other Ambulatory Visit: Payer: Self-pay

## 2015-02-02 MED ORDER — AMLODIPINE BESYLATE 10 MG PO TABS: 10.0000 mg | ORAL_TABLET | Freq: Every day | ORAL | Status: DC

## 2015-02-02 MED ORDER — LISINOPRIL-HYDROCHLOROTHIAZIDE 10-12.5 MG PO TABS: 1.0000 | ORAL_TABLET | Freq: Every day | ORAL | Status: DC

## 2015-02-02 NOTE — Telephone Encounter (Signed)
Rx refill bp meds.

## 2015-02-15 ENCOUNTER — Other Ambulatory Visit: Payer: Self-pay

## 2015-02-15 NOTE — Telephone Encounter (Signed)
Please advise on refill.

## 2015-02-16 MED ORDER — ALPRAZOLAM 0.5 MG PO TABS: ORAL_TABLET | ORAL | Status: DC

## 2015-02-16 NOTE — Telephone Encounter (Signed)
Have her follow up 2 month follow up.I did refill her xanax.  Also she looks like needs flu vaccine. Remind her she can come in and just get that. Also looks like she needs complete physical on lab review. Does she have insurance. She is 64. Should have medicare. Have her schedule medicare wellness portion  with [facility] if she has medicare. And then schedule with me 1 wk later for physical.

## 2015-02-17 NOTE — Telephone Encounter (Signed)
Pt has upcoming appt next week.  ?

## 2015-02-18 ENCOUNTER — Ambulatory Visit: Payer: Medicare HMO | Admitting: Medical

## 2015-02-21 ENCOUNTER — Ambulatory Visit (HOSPITAL_BASED_OUTPATIENT_CLINIC_OR_DEPARTMENT_OTHER)
Admission: RE | Admit: 2015-02-21 | Discharge: 2015-02-21 | Disposition: A | Discharge status: Home / Self Care | Payer: Medicare HMO | Source: Ambulatory Visit | Attending: Medical | Admitting: Medical

## 2015-02-21 ENCOUNTER — Encounter: Payer: Self-pay | Admitting: Medical

## 2015-02-21 ENCOUNTER — Encounter (INDEPENDENT_AMBULATORY_CARE_PROVIDER_SITE_OTHER): Payer: Self-pay

## 2015-02-21 ENCOUNTER — Ambulatory Visit (INDEPENDENT_AMBULATORY_CARE_PROVIDER_SITE_OTHER): Payer: Medicare HMO | Admitting: Medical

## 2015-02-21 VITALS — BP 122/76 | HR 81 | Temp 97.9°F | Ht 62.0 in | Wt 187.0 lb

## 2015-02-21 DIAGNOSIS — M5136 Other intervertebral disc degeneration, lumbar region: Secondary | ICD-10-CM | POA: Diagnosis not present

## 2015-02-21 DIAGNOSIS — M47892 Other spondylosis, cervical region: Secondary | ICD-10-CM | POA: Diagnosis not present

## 2015-02-21 DIAGNOSIS — E785 Hyperlipidemia, unspecified: Secondary | ICD-10-CM

## 2015-02-21 DIAGNOSIS — F411 Generalized anxiety disorder: Secondary | ICD-10-CM | POA: Diagnosis not present

## 2015-02-21 DIAGNOSIS — M25512 Pain in left shoulder: Secondary | ICD-10-CM | POA: Insufficient documentation

## 2015-02-21 DIAGNOSIS — M542 Cervicalgia: Secondary | ICD-10-CM

## 2015-02-21 DIAGNOSIS — M545 Low back pain: Secondary | ICD-10-CM

## 2015-02-21 DIAGNOSIS — M62830 Muscle spasm of back: Secondary | ICD-10-CM | POA: Insufficient documentation

## 2015-02-21 DIAGNOSIS — E781 Pure hyperglyceridemia: Secondary | ICD-10-CM

## 2015-02-21 DIAGNOSIS — I10 Essential (primary) hypertension: Secondary | ICD-10-CM

## 2015-02-21 LAB — LIPID PANEL
Cholesterol: 241 mg/dL — ABNORMAL HIGH (ref 0–200)
HDL: 48.3 mg/dL (ref >39.00)
NONHDL: 192.66
Total CHOL/HDL Ratio: 5
Triglycerides: 347 mg/dL — ABNORMAL HIGH (ref 0.0–149.0)
VLDL: 69.4 mg/dL — ABNORMAL HIGH (ref 0.0–40.0)

## 2015-02-21 LAB — COMPREHENSIVE METABOLIC PANEL
ALT: 12 U/L (ref 0–35)
AST: 19 U/L (ref 0–37)
Albumin: 4.4 g/dL (ref 3.5–5.2)
Alkaline Phosphatase: 55 U/L (ref 39–117)
BILIRUBIN TOTAL: 0.4 mg/dL (ref 0.2–1.2)
BUN: 13 mg/dL (ref 6–23)
CO2: 25 meq/L (ref 19–32)
CREATININE: 0.72 mg/dL (ref 0.40–1.20)
Calcium: 9.7 mg/dL (ref 8.4–10.5)
Chloride: 101 mEq/L (ref 96–112)
GFR: 86.03 mL/min (ref >60.00)
GLUCOSE: 102 mg/dL — AB (ref 70–99)
Potassium: 3.7 mEq/L (ref 3.5–5.1)
Sodium: 137 mEq/L (ref 135–145)
Total Protein: 7.3 g/dL (ref 6.0–8.3)

## 2015-02-21 MED ORDER — DICLOFENAC SODIUM 75 MG PO TBEC: 75.0000 mg | DELAYED_RELEASE_TABLET | Freq: Two times a day (BID) | ORAL | Status: DC

## 2015-02-21 MED ORDER — PAROXETINE HCL 40 MG PO TABS: 40.0000 mg | ORAL_TABLET | ORAL | Status: DC

## 2015-02-21 MED ORDER — TIZANIDINE HCL 6 MG PO CAPS: ORAL_CAPSULE | ORAL | Status: DC

## 2015-02-21 NOTE — Progress Notes (Signed)
Subjective:    Patient ID: Sarah Barnes, female    DOB: [birth year], 64 y.o.   MRN: [id number]  HPI   Pt in for follow up. She states she has had some neck pain and some lower back pain.   Pt had neck pain for about 1 yr.No pain shooting to her arms. Pain in neck worse when she lays down. Pain worse with carrying things. Pain more on rt side trapezius area. Pt tries only ibuprofen in the past. Does get some relief.  Also one week of lower back pain. Pt had hx of 2 surgeries years about 12-13 yrs ago. Occasional back will get thrown out.  Rare occasional pain. But last week after walking to a store she  Developed low back  spasms. Since then has pain. Pain just in her lower back region. Pt did mention episode of sciatica one time in last year for which she went to the ED.  Pt has lost 7 pounds. She is eating less and more healthy. Purposeful weight loss.  Pt states her anxiety still present but is controlled. Pt is on paxil and takes xanax just one tab a day.  Pt has been taking zocor and lofibra for her lipids. Diet efforts to control lipids as well.     Review of Systems  Constitutional: Negative for fever, chills and fatigue.  Respiratory: Negative for cough, chest tightness and wheezing.   Cardiovascular: Negative for chest pain and palpitations.  Gastrointestinal: Negative for nausea, vomiting and abdominal pain.  Genitourinary: Negative for dysuria, urgency, hematuria and flank pain.  Musculoskeletal: Positive for back pain and neck pain.  Neurological: Negative for weakness and numbness.  Psychiatric/Behavioral: Negative for suicidal ideas, behavioral problems, confusion, sleep disturbance, dysphoric mood and agitation. The patient is nervous/anxious.     Past Medical History  Diagnosis Date  . Hypertension   . High cholesterol   . Hernia, hiatal   . Back pain   . Sciatica   . ETOH abuse     Social History   Social History  . Marital Status: Single    Spouse  Name: N/A  . Number of Children: N/A  . Years of Education: N/A   Occupational History  . Not on file.   Social History Main Topics  . Smoking status: Current Every Day Smoker -- 2.00 packs/day    Types: Cigarettes  . Smokeless tobacco: Not on file  . Alcohol Use: 25.2 oz/week    42 Cans of beer per week  . Drug Use: No  . Sexual Activity: Not on file   Other Topics Concern  . Not on file   Social History Narrative    Past Surgical History  Procedure Laterality Date  . Back surgery    . Shoulder arthroscopy    . Cholecystectomy      Family History  Problem Relation Age of Onset  . Diabetes Mother   . Hyperlipidemia Mother   . Hypertension Mother   . Hypertension Father     No Known Allergies  Current Outpatient Prescriptions on File Prior to Visit  Medication Sig Dispense Refill  . ALPRAZolam (XANAX) 0.5 MG tablet 1 tab po q day as needed anxiety 30 tablet 2  . amLODipine (NORVASC) 10 MG tablet Take 1 tablet (10 mg total) by mouth daily. 90 tablet 1  . fenofibrate micronized (LOFIBRA) 134 MG capsule Take 1 capsule (134 mg total) by mouth daily before breakfast. 30 capsule 2  . lisinopril-hydrochlorothiazide (PRINZIDE,ZESTORETIC) 10-12.5 MG  per tablet Take 1 tablet by mouth daily. 90 tablet 1  . naproxen (NAPROSYN) 500 MG tablet Take 1 tablet (500 mg total) by mouth 2 (two) times daily with a meal. 60 tablet 1  . neomycin-polymyxin-hydrocortisone (CORTISPORIN) otic solution Place 3 drops into the left ear 4 (four) times daily. 10 mL 0  . omeprazole (PRILOSEC) 20 MG capsule TAKE 1 CAPSULE (20 MG TOTAL) BY MOUTH DAILY. 30 capsule 3  . ranitidine (ZANTAC) 150 MG tablet TAKE 1 TABLET (150 MG TOTAL) BY MOUTH 2 (TWO) TIMES DAILY. 60 tablet 3  . simvastatin (ZOCOR) 20 MG tablet Take 1 tablet (20 mg total) by mouth daily. 30 tablet 3   No current facility-administered medications on file prior to visit.    BP 122/76 mmHg  Pulse 81  Temp(Src) 97.9 F (36.6 C) (Oral)   Ht 5\' 2"  (1.575 m)  Wt 187 lb (84.823 kg)  BMI 34.19 kg/m2  SpO2 97%       Objective:   Physical Exam  General Appearance- Not in acute distress.  Neck- mild pain on palpation rt side trapezius. Pain as trapezius inserts into occipital area. No mid cspine pain.  Chest and Lung Exam Auscultation: Breath sounds:-Normal. Clear even and unlabored. Adventitious sounds:- No Adventitious sounds.  Cardiovascular Auscultation:Rythm - Regular, rate and rythm. Heart Sounds -Normal heart sounds.  Abdomen Inspection:-Inspection Normal.  Palpation/Perucssion: Palpation and Percussion of the abdomen reveal- Non Tender, No Rebound tenderness, No rigidity(Guarding) and No Palpable abdominal masses.  Liver:-Normal.  Spleen:- Normal.   Back Mid lumbar spine tenderness to palpation. Pain mild  on straight leg lift. Pain on lateral movements and flexion/extension of the spine.  Lower ext neurologic  L5-S1 sensation intact bilaterally. Normal patellar reflexes bilaterally. No foot drop bilaterally.      Assessment & Plan:  For your low back pain will get xray. Rx diclofenac and zanaflex.  For your neck pain will get xray. Same management as above for neck.  Continue paxil and xanax for anxiety. Rx refill of paxil.  For htn continue current bp med. This is well controlled today.  For lipids will get fasting labs today. With cmp today.  Follow up in 3 month or as needed(sooner follow up if back or neck pain persists)

## 2015-02-21 NOTE — Patient Instructions (Addendum)
For your low back pain will get xray. Rx diclofenac and zanaflex.  For your neck pain will get xray. Same management as above for neck.  Continue paxil and xanax for anxiety. Rx refill of paxil.  For htn continue current bp med. This is well controlled today.  For lipids will get fasting labs today. With cmp today.  Follow up in 3 month or as needed(sooner follow up if back or neck pain persists)

## 2015-02-21 NOTE — Progress Notes (Signed)
Pre visit review using our clinic review tool, if applicable. No additional management support is needed unless otherwise documented below in the visit note. 

## 2015-02-22 LAB — LDL CHOLESTEROL, DIRECT: LDL DIRECT: 128 mg/dL

## 2015-02-23 ENCOUNTER — Telehealth: Payer: Self-pay | Admitting: Medical

## 2015-02-23 MED ORDER — ROSUVASTATIN CALCIUM 40 MG PO TABS: 40.0000 mg | ORAL_TABLET | Freq: Every day | ORAL | Status: DC

## 2015-02-23 NOTE — Telephone Encounter (Signed)
rx crestor. Call pt and see if it was covered. In event was covered. Then remind her stop simvastatin. Take simvastatin off med list.
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
## 2015-03-10 ENCOUNTER — Other Ambulatory Visit: Payer: Self-pay | Admitting: Medical

## 2015-03-17 ENCOUNTER — Other Ambulatory Visit: Payer: Self-pay | Admitting: Medical

## 2015-04-18 ENCOUNTER — Other Ambulatory Visit: Payer: Self-pay | Admitting: Medical

## 2015-05-04 ENCOUNTER — Telehealth: Payer: Self-pay | Admitting: Medical

## 2015-05-04 NOTE — Telephone Encounter (Signed)
Left message for patient to call back about Flu Shot

## 2015-05-18 ENCOUNTER — Other Ambulatory Visit: Payer: Self-pay | Admitting: Medical

## 2015-05-18 ENCOUNTER — Other Ambulatory Visit: Payer: Self-pay

## 2015-05-18 MED ORDER — ALPRAZOLAM 0.5 MG PO TABS: ORAL_TABLET | ORAL | Status: DC

## 2015-05-18 NOTE — Telephone Encounter (Signed)
Last OV was 02/21/15. Last refill was 02/16/15 #30 with 2 Refills. Please advise on refill.

## 2015-05-18 NOTE — Telephone Encounter (Signed)
Left message for pt that Rx was ready

## 2015-05-18 NOTE — Telephone Encounter (Signed)
Also spoke with pt and she voices understanding.

## 2015-05-18 NOTE — Telephone Encounter (Signed)
Advise pt that she is do for a follow up appointment so I only gave on month of xanax.

## 2015-05-25 ENCOUNTER — Ambulatory Visit: Payer: Medicare HMO | Admitting: Medical

## 2015-05-26 ENCOUNTER — Encounter: Payer: Self-pay | Admitting: Medical

## 2015-05-26 ENCOUNTER — Ambulatory Visit (INDEPENDENT_AMBULATORY_CARE_PROVIDER_SITE_OTHER): Payer: Medicare HMO | Admitting: Medical

## 2015-05-26 ENCOUNTER — Telehealth: Payer: Self-pay | Admitting: Medical

## 2015-05-26 VITALS — BP 126/84 | HR 78 | Temp 98.0°F | Ht 62.0 in | Wt 193.6 lb

## 2015-05-26 DIAGNOSIS — F411 Generalized anxiety disorder: Secondary | ICD-10-CM

## 2015-05-26 DIAGNOSIS — J0100 Acute maxillary sinusitis, unspecified: Secondary | ICD-10-CM | POA: Diagnosis not present

## 2015-05-26 DIAGNOSIS — R062 Wheezing: Secondary | ICD-10-CM | POA: Diagnosis not present

## 2015-05-26 DIAGNOSIS — E785 Hyperlipidemia, unspecified: Secondary | ICD-10-CM | POA: Diagnosis not present

## 2015-05-26 LAB — COMPREHENSIVE METABOLIC PANEL
ALBUMIN: 4.4 g/dL (ref 3.5–5.2)
ALT: 10 U/L (ref 0–35)
AST: 16 U/L (ref 0–37)
Alkaline Phosphatase: 48 U/L (ref 39–117)
BILIRUBIN TOTAL: 0.4 mg/dL (ref 0.2–1.2)
BUN: 11 mg/dL (ref 6–23)
CHLORIDE: 103 meq/L (ref 96–112)
CO2: 27 meq/L (ref 19–32)
CREATININE: 0.72 mg/dL (ref 0.40–1.20)
Calcium: 9.8 mg/dL (ref 8.4–10.5)
GFR: 85.96 mL/min (ref >60.00)
Glucose, Bld: 99 mg/dL (ref 70–99)
Potassium: 3.8 mEq/L (ref 3.5–5.1)
SODIUM: 139 meq/L (ref 135–145)
Total Protein: 7.4 g/dL (ref 6.0–8.3)

## 2015-05-26 LAB — LDL CHOLESTEROL, DIRECT: Direct LDL: 177 mg/dL

## 2015-05-26 LAB — LIPID PANEL
Cholesterol: 341 mg/dL — ABNORMAL HIGH (ref 0–200)
HDL: 50.3 mg/dL (ref >39.00)
Total CHOL/HDL Ratio: 7
Triglycerides: 435 mg/dL — ABNORMAL HIGH (ref 0.0–149.0)

## 2015-05-26 MED ORDER — ALBUTEROL SULFATE HFA 108 (90 BASE) MCG/ACT IN AERS: 2.0000 | INHALATION_SPRAY | Freq: Four times a day (QID) | RESPIRATORY_TRACT | Status: DC | PRN

## 2015-05-26 MED ORDER — BENZONATATE 100 MG PO CAPS: 100.0000 mg | ORAL_CAPSULE | Freq: Three times a day (TID) | ORAL | Status: DC | PRN

## 2015-05-26 MED ORDER — AZITHROMYCIN 250 MG PO TABS: ORAL_TABLET | ORAL | Status: DC

## 2015-05-26 MED ORDER — FLUTICASONE PROPIONATE 50 MCG/ACT NA SUSP: 2.0000 | Freq: Every day | NASAL | Status: DC

## 2015-05-26 MED ORDER — ROSUVASTATIN CALCIUM 40 MG PO TABS: 40.0000 mg | ORAL_TABLET | Freq: Every day | ORAL | Status: DC

## 2015-05-26 MED FILL — BENZONATATE 100 MG CAPSULE: 100 | 10 days supply | Qty: 30 | Fill #0

## 2015-05-26 MED FILL — VENTOLIN HFA 90 MCG INHALER: 108 (90 BAS | 30 days supply | Qty: 18 | Fill #0

## 2015-05-26 MED FILL — AZITHROMYCIN 250 MG TABLET: 250 | 5 days supply | Qty: 6 | Fill #0

## 2015-05-26 MED FILL — FLUTICASONE PROP 50 MCG SPR: 50 | 30 days supply | Qty: 16 | Fill #0

## 2015-05-26 NOTE — Patient Instructions (Addendum)
You appear to have a sinus infection. I am prescribing azithromycin  antibiotic for the infection. To help with the nasal congestion I prescribed flonase  nasal steroid. For your associated cough, I prescribed cough medicine benzonatate  Rest, hydrate, tylenol for fever.  Follow up in 7 days or as needed.  Htn controlled today. Continue current regimen.  Hyperlipidemia. Check lipid panel today. Make med adjustment if needed after lab review.   Anxiety controlled continue paxil and xanax. Will refill meds with current rx runs out.

## 2015-05-26 NOTE — Progress Notes (Signed)
Subjective:    Patient ID: Sarah Barnes, female    DOB: 1951/03/15, 65 y.o.   MRN: [id number]  HPI  Pt has 2 wks of sinus pressure, pnd, productive cough and some wheezing at night. Pt is also coughing more at night.  Some sweating at night recenlty. No fever or chills.  Pt bp today is good. No cardiac or neurologic signs or symptoms.  Pt anxiety is controlled with xanax. Pt is still on paxil as well. No reported severe anxiety or panic attacks.  Pt lipids have been elevated. She is fasting today. She is on both fenofibrate and simistatin.     Review of Systems  Constitutional: Negative for fever, chills and fatigue.  HENT: Positive for congestion, postnasal drip and sinus pressure. Negative for ear pain.   Respiratory: Positive for cough and wheezing. Negative for chest tightness and shortness of breath.        Mild at night wheezing.  Cardiovascular: Negative for chest pain and palpitations.  Gastrointestinal: Negative for abdominal pain.  Skin: Negative for rash.  Neurological: Negative for dizziness and headaches.  Hematological: Negative for adenopathy. Does not bruise/bleed easily.  Psychiatric/Behavioral: Negative for suicidal ideas, behavioral problems and confusion. The patient is nervous/anxious.        Controlled with paxil and xanax.   Past Medical History  Diagnosis Date  . Hypertension   . High cholesterol   . Hernia, hiatal   . Back pain   . Sciatica   . ETOH abuse     Social History   Social History  . Marital Status: Single    Spouse Name: N/A  . Number of Children: N/A  . Years of Education: N/A   Occupational History  . Not on file.   Social History Main Topics  . Smoking status: Current Every Day Smoker -- 2.00 packs/day    Types: Cigarettes  . Smokeless tobacco: Not on file  . Alcohol Use: 25.2 oz/week    42 Cans of beer per week  . Drug Use: No  . Sexual Activity: Not on file   Other Topics Concern  . Not on file   Social  History Narrative    Past Surgical History  Procedure Laterality Date  . Back surgery    . Shoulder arthroscopy    . Cholecystectomy      Family History  Problem Relation Age of Onset  . Diabetes Mother   . Hyperlipidemia Mother   . Hypertension Mother   . Hypertension Father     No Known Allergies  Current Outpatient Prescriptions on File Prior to Visit  Medication Sig Dispense Refill  . ALPRAZolam (XANAX) 0.5 MG tablet 1 tab po q day as needed anxiety 30 tablet 0  . amLODipine (NORVASC) 10 MG tablet Take 1 tablet (10 mg total) by mouth daily. 90 tablet 1  . fenofibrate micronized (LOFIBRA) 134 MG capsule TAKE 1 CAPSULE (134 MG TOTAL) BY MOUTH DAILY BEFORE BREAKFAST. 30 capsule 2  . lisinopril-hydrochlorothiazide (PRINZIDE,ZESTORETIC) 10-12.5 MG tablet TAKE 1 TABLET BY MOUTH EVERY DAY 30 tablet 3  . naproxen (NAPROSYN) 500 MG tablet TAKE 1 TABLET (500 MG TOTAL) BY MOUTH 2 (TWO) TIMES DAILY WITH A MEAL. 60 tablet 1  . neomycin-polymyxin-hydrocortisone (CORTISPORIN) otic solution Place 3 drops into the left ear 4 (four) times daily. 10 mL 0  . omeprazole (PRILOSEC) 20 MG capsule TAKE 1 CAPSULE (20 MG TOTAL) BY MOUTH DAILY. 30 capsule 3  . ranitidine (ZANTAC) 150 MG tablet TAKE  1 TABLET (150 MG TOTAL) BY MOUTH 2 (TWO) TIMES DAILY. 60 tablet 3  . rosuvastatin (CRESTOR) 40 MG tablet Take 1 tablet (40 mg total) by mouth daily. 30 tablet 3  . tiZANidine (ZANAFLEX) 4 MG tablet TAKE 1&1/2 TABLET BY MOUTH AT BEDTIME AS NEEDED FOR MUSCLE SPASMS 15 tablet 0   No current facility-administered medications on file prior to visit.    BP 126/84 mmHg  Pulse 78  Temp(Src) 98 F (36.7 C) (Oral)  Ht 5\' 2"  (1.575 m)  Wt 193 lb 9.6 oz (87.816 kg)  BMI 35.40 kg/m2  SpO2 97%       Objective:   Physical Exam  General  Mental Status - Alert. General Appearance - Well groomed. Not in acute distress.  Skin Rashes- No Rashes.  HEENT Head- Normal. Ear Auditory Canal - Left- Normal.  Right - Normal.Tympanic Membrane- Left- Normal. Right- Normal. Eye Sclera/Conjunctiva- Left- Normal. Right- Normal. Nose & Sinuses Nasal Mucosa- Left-  Boggy and Congested. Right-  Boggy and  Congested.Bilateral maxillary sinus pressure but  no frontal sinus pressure. Mouth & Throat Lips: Upper Lip- Normal: no dryness, cracking, pallor, cyanosis, or vesicular eruption. Lower Lip-Normal: no dryness, cracking, pallor, cyanosis or vesicular eruption. Buccal Mucosa- Bilateral- No Aphthous ulcers. Oropharynx- No Discharge or Erythema. +pnd Tonsils: Characteristics- Bilateral- No Erythema or Congestion. Size/Enlargement- Bilateral- No enlargement. Discharge- bilateral-None.  Neck Neck- Supple. No Masses.   Chest and Lung Exam Auscultation: Breath Sounds:-Clear even and unlabored.  Cardiovascular Auscultation:Rythm- Regular, rate and rhythm. Murmurs & Other Heart Sounds:Ausculatation of the heart reveal- No Murmurs.  Lymphatic Head & Neck General Head & Neck Lymphatics: Bilateral: Description- No Localized lymphadenopathy.   Neurologic Cranial Nerve exam:- CN III-XII intact(No nystagmus), symmetric smile. Strength:- 5/5 equal and symmetric strength both upper and lower extremities.        Assessment & Plan:  You appear to have a sinus infection. I am prescribing azithromycin  antibiotic for the infection. To help with the nasal congestion I prescribed flonase  nasal steroid. For your associated cough, I prescribed cough medicine benzonatate  I did make albuterol available for pt wheezing at night.  Rest, hydrate, tylenol for fever.  Follow up in 7 days or as needed.  Htn controlled today. Continue current regimen.  Hyperlipidemia. Check lipid panel today. Make med adjustment if needed after lab review.   Anxiety controlled continue paxil and xanax. Will refill meds with current rx runs out

## 2015-05-26 NOTE — Progress Notes (Signed)
Pre visit review using our clinic review tool, if applicable. No additional management support is needed unless otherwise documented below in the visit note. 

## 2015-05-26 NOTE — Telephone Encounter (Signed)
Sent in crestor to her pharmacy stop. Simvastatin.

## 2015-05-27 MED FILL — ROSUVASTATIN CALCIUM 40 MG: 40 | 30 days supply | Qty: 30 | Fill #0

## 2015-05-27 NOTE — Telephone Encounter (Signed)
Left detailed message on cell for pt to call back if she has any questions about the changes in medication.//HSM

## 2015-06-06 ENCOUNTER — Telehealth: Payer: Self-pay | Admitting: Medical

## 2015-06-06 NOTE — Telephone Encounter (Signed)
Patient is coming in Thursday 1/19 to get her Flu Shot and is requesting a Pneumonia Shot also. States that Percell Miller suggested she get both. Please advise.

## 2015-06-06 NOTE — Telephone Encounter (Signed)
Per ES VO ok to have the flu shot and pneumonia 23 shot. She can be scheduled for both.

## 2015-06-06 NOTE — Telephone Encounter (Signed)
Scheduled for both

## 2015-06-09 ENCOUNTER — Ambulatory Visit: Payer: Medicare HMO

## 2015-06-15 ENCOUNTER — Ambulatory Visit: Payer: Medicare HMO

## 2015-06-16 ENCOUNTER — Other Ambulatory Visit: Payer: Self-pay | Admitting: Medical

## 2015-06-16 MED FILL — FENOFIBRATE 134 MG CAPSULE: 134 | 30 days supply | Qty: 30 | Fill #1

## 2015-06-16 MED FILL — OMEPRAZOLE DR 20 MG CAPSULE: 20 | 30 days supply | Qty: 30 | Fill #3

## 2015-06-16 MED FILL — tiZANidine HCL 4 MG TABS: 4 | 10 days supply | Qty: 15 | Fill #0

## 2015-06-16 MED FILL — PARoxetine HCL 40 MG TABS: 40 | 30 days supply | Qty: 30 | Fill #0

## 2015-06-16 MED FILL — LISINOPRIL-HCTZ 10-12.5 MG: 10-12.5 | 90 days supply | Qty: 90 | Fill #0

## 2015-06-17 MED FILL — ALPRAZolam 0.5 MG TABS: 0.5 | 30 days supply | Qty: 30 | Fill #0

## 2015-07-05 ENCOUNTER — Inpatient Hospital Stay (HOSPITAL_COMMUNITY)
Admission: EM | Admit: 2015-07-05 | Discharge: 2015-07-12 | DRG: 917 | Disposition: A | Discharge status: Other Acute Inpatient Hospital | Payer: Medicare HMO | Attending: Internal Medicine | Admitting: Internal Medicine

## 2015-07-05 ENCOUNTER — Encounter (HOSPITAL_COMMUNITY): Payer: Self-pay | Admitting: *Deleted

## 2015-07-05 DIAGNOSIS — E876 Hypokalemia: Secondary | ICD-10-CM | POA: Diagnosis present

## 2015-07-05 DIAGNOSIS — Z833 Family history of diabetes mellitus: Secondary | ICD-10-CM

## 2015-07-05 DIAGNOSIS — F17210 Nicotine dependence, cigarettes, uncomplicated: Secondary | ICD-10-CM | POA: Diagnosis present

## 2015-07-05 DIAGNOSIS — R579 Shock, unspecified: Secondary | ICD-10-CM | POA: Diagnosis present

## 2015-07-05 DIAGNOSIS — T461X2A Poisoning by calcium-channel blockers, intentional self-harm, initial encounter: Secondary | ICD-10-CM

## 2015-07-05 DIAGNOSIS — Z452 Encounter for adjustment and management of vascular access device: Secondary | ICD-10-CM | POA: Insufficient documentation

## 2015-07-05 DIAGNOSIS — E7800 Pure hypercholesterolemia, unspecified: Secondary | ICD-10-CM | POA: Diagnosis present

## 2015-07-05 DIAGNOSIS — E785 Hyperlipidemia, unspecified: Secondary | ICD-10-CM | POA: Diagnosis present

## 2015-07-05 DIAGNOSIS — T50902A Poisoning by unspecified drugs, medicaments and biological substances, intentional self-harm, initial encounter: Principal | ICD-10-CM | POA: Diagnosis present

## 2015-07-05 DIAGNOSIS — R578 Other shock: Secondary | ICD-10-CM | POA: Diagnosis present

## 2015-07-05 DIAGNOSIS — Z23 Encounter for immunization: Secondary | ICD-10-CM

## 2015-07-05 DIAGNOSIS — J9690 Respiratory failure, unspecified, unspecified whether with hypoxia or hypercapnia: Secondary | ICD-10-CM

## 2015-07-05 DIAGNOSIS — J449 Chronic obstructive pulmonary disease, unspecified: Secondary | ICD-10-CM | POA: Diagnosis present

## 2015-07-05 DIAGNOSIS — M549 Dorsalgia, unspecified: Secondary | ICD-10-CM | POA: Diagnosis present

## 2015-07-05 DIAGNOSIS — I959 Hypotension, unspecified: Secondary | ICD-10-CM | POA: Diagnosis present

## 2015-07-05 DIAGNOSIS — R45851 Suicidal ideations: Secondary | ICD-10-CM

## 2015-07-05 DIAGNOSIS — N179 Acute kidney failure, unspecified: Secondary | ICD-10-CM | POA: Diagnosis present

## 2015-07-05 DIAGNOSIS — E872 Acidosis: Secondary | ICD-10-CM | POA: Diagnosis present

## 2015-07-05 DIAGNOSIS — M5430 Sciatica, unspecified side: Secondary | ICD-10-CM | POA: Diagnosis present

## 2015-07-05 DIAGNOSIS — Z8249 Family history of ischemic heart disease and other diseases of the circulatory system: Secondary | ICD-10-CM

## 2015-07-05 DIAGNOSIS — R40241 Glasgow coma scale score 13-15, unspecified time: Secondary | ICD-10-CM | POA: Diagnosis present

## 2015-07-05 DIAGNOSIS — R0902 Hypoxemia: Secondary | ICD-10-CM | POA: Diagnosis present

## 2015-07-05 DIAGNOSIS — F329 Major depressive disorder, single episode, unspecified: Secondary | ICD-10-CM | POA: Diagnosis present

## 2015-07-05 DIAGNOSIS — F1010 Alcohol abuse, uncomplicated: Secondary | ICD-10-CM | POA: Diagnosis present

## 2015-07-05 DIAGNOSIS — T464X2A Poisoning by angiotensin-converting-enzyme inhibitors, intentional self-harm, initial encounter: Secondary | ICD-10-CM | POA: Diagnosis present

## 2015-07-05 DIAGNOSIS — I10 Essential (primary) hypertension: Secondary | ICD-10-CM | POA: Diagnosis present

## 2015-07-05 DIAGNOSIS — F419 Anxiety disorder, unspecified: Secondary | ICD-10-CM | POA: Diagnosis present

## 2015-07-05 LAB — CBC WITH DIFFERENTIAL/PLATELET
BASOS PCT: 0 %
Basophils Absolute: 0 10*3/uL (ref 0.0–0.1)
EOS ABS: 0.2 10*3/uL (ref 0.0–0.7)
Eosinophils Relative: 2 %
HCT: 43.1 % (ref 36.0–46.0)
Hemoglobin: 14.9 g/dL (ref 12.0–15.0)
LYMPHS ABS: 4.9 10*3/uL — AB (ref 0.7–4.0)
Lymphocytes Relative: 49 %
MCH: 32.3 pg (ref 26.0–34.0)
MCHC: 34.6 g/dL (ref 30.0–36.0)
MCV: 93.5 fL (ref 78.0–100.0)
MONO ABS: 0.7 10*3/uL (ref 0.1–1.0)
Monocytes Relative: 7 %
NEUTROS PCT: 42 %
Neutro Abs: 4.2 10*3/uL (ref 1.7–7.7)
PLATELETS: 260 10*3/uL (ref 150–400)
RBC: 4.61 MIL/uL (ref 3.87–5.11)
RDW: 13 % (ref 11.5–15.5)
WBC: 10 10*3/uL (ref 4.0–10.5)

## 2015-07-05 LAB — BASIC METABOLIC PANEL
ANION GAP: 17 — AB (ref 5–15)
BUN: 19 mg/dL (ref 6–20)
CALCIUM: 9.8 mg/dL (ref 8.9–10.3)
CO2: 20 mmol/L — ABNORMAL LOW (ref 22–32)
CREATININE: 1.02 mg/dL — AB (ref 0.44–1.00)
Chloride: 103 mmol/L (ref 101–111)
GFR calc Af Amer: >60 mL/min (ref >60)
GFR, EST NON AFRICAN AMERICAN: 56 mL/min — AB (ref >60)
GLUCOSE: 106 mg/dL — AB (ref 65–99)
Potassium: 3.1 mmol/L — ABNORMAL LOW (ref 3.5–5.1)
Sodium: 140 mmol/L (ref 135–145)

## 2015-07-05 LAB — ETHANOL: Alcohol, Ethyl (B): 120 mg/dL — ABNORMAL HIGH (ref <5)

## 2015-07-05 MED ORDER — SODIUM CHLORIDE 0.9 % IV BOLUS (SEPSIS)
1000.0000 mL | Freq: Once | INTRAVENOUS | Status: AC
  Administered 2015-07-05: 1000 mL via INTRAVENOUS

## 2015-07-05 MED ORDER — SODIUM CHLORIDE 0.9 % IV BOLUS (SEPSIS)
1000.0000 mL | Freq: Once | INTRAVENOUS | Status: AC
  Administered 2015-07-06: 1000 mL via INTRAVENOUS

## 2015-07-05 MED ORDER — SODIUM CHLORIDE 0.9 % IV BOLUS (SEPSIS)
1000.0000 mL | Freq: Once | INTRAVENOUS | Status: AC
Start: 2015-07-05 — End: 2015-07-05
  Administered 2015-07-05: 1000 mL via INTRAVENOUS

## 2015-07-05 MED ORDER — DEXTROSE 5 % IV SOLN
0.0000 ug/min | INTRAVENOUS | Status: DC
  Administered 2015-07-06: 4 ug/min via INTRAVENOUS
  Administered 2015-07-06: 34 ug/min via INTRAVENOUS
  Administered 2015-07-06: 45 ug/min via INTRAVENOUS
  Filled 2015-07-05 (×3): qty 4

## 2015-07-05 MED ORDER — SODIUM CHLORIDE 0.9 % IV SOLN
1.0000 g | Freq: Once | INTRAVENOUS | Status: AC
  Administered 2015-07-05: 1 g via INTRAVENOUS
  Filled 2015-07-05: qty 10

## 2015-07-05 NOTE — ED Notes (Signed)
The pt reports that she also drank 2 bottles of wine with the pills  She reports that she wants to kill herself

## 2015-07-05 NOTE — ED Notes (Signed)
sats low  Pt placed on non-rebreather
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
## 2015-07-05 NOTE — ED Notes (Signed)
The pt arrived by gems  After she took  All her meds  crestor zocor paxel prilosec zestorecticxanax  femofibrate  Zantac naproxen  norvasc approx 2 hours ago  She last ate at 1100am today  She did this because she was angry with her sister  Alert  She reports that she does not  Want a tube placed in her mouth and she will not take any charcoal

## 2015-07-06 ENCOUNTER — Inpatient Hospital Stay (HOSPITAL_COMMUNITY): Payer: Medicare HMO

## 2015-07-06 DIAGNOSIS — E872 Acidosis: Secondary | ICD-10-CM | POA: Diagnosis present

## 2015-07-06 DIAGNOSIS — Z8249 Family history of ischemic heart disease and other diseases of the circulatory system: Secondary | ICD-10-CM | POA: Diagnosis not present

## 2015-07-06 DIAGNOSIS — Z833 Family history of diabetes mellitus: Secondary | ICD-10-CM | POA: Diagnosis not present

## 2015-07-06 DIAGNOSIS — Z23 Encounter for immunization: Secondary | ICD-10-CM | POA: Diagnosis not present

## 2015-07-06 DIAGNOSIS — M549 Dorsalgia, unspecified: Secondary | ICD-10-CM | POA: Diagnosis not present

## 2015-07-06 DIAGNOSIS — N179 Acute kidney failure, unspecified: Secondary | ICD-10-CM | POA: Diagnosis present

## 2015-07-06 DIAGNOSIS — R578 Other shock: Secondary | ICD-10-CM | POA: Diagnosis present

## 2015-07-06 DIAGNOSIS — M5430 Sciatica, unspecified side: Secondary | ICD-10-CM | POA: Diagnosis not present

## 2015-07-06 DIAGNOSIS — E876 Hypokalemia: Secondary | ICD-10-CM | POA: Diagnosis not present

## 2015-07-06 DIAGNOSIS — J449 Chronic obstructive pulmonary disease, unspecified: Secondary | ICD-10-CM | POA: Diagnosis not present

## 2015-07-06 DIAGNOSIS — F411 Generalized anxiety disorder: Secondary | ICD-10-CM | POA: Diagnosis not present

## 2015-07-06 DIAGNOSIS — I959 Hypotension, unspecified: Secondary | ICD-10-CM | POA: Diagnosis not present

## 2015-07-06 DIAGNOSIS — E785 Hyperlipidemia, unspecified: Secondary | ICD-10-CM | POA: Diagnosis not present

## 2015-07-06 DIAGNOSIS — F329 Major depressive disorder, single episode, unspecified: Secondary | ICD-10-CM | POA: Diagnosis present

## 2015-07-06 DIAGNOSIS — T464X2A Poisoning by angiotensin-converting-enzyme inhibitors, intentional self-harm, initial encounter: Secondary | ICD-10-CM | POA: Diagnosis not present

## 2015-07-06 DIAGNOSIS — R40241 Glasgow coma scale score 13-15, unspecified time: Secondary | ICD-10-CM | POA: Diagnosis not present

## 2015-07-06 DIAGNOSIS — E7800 Pure hypercholesterolemia, unspecified: Secondary | ICD-10-CM | POA: Diagnosis not present

## 2015-07-06 DIAGNOSIS — T50902A Poisoning by unspecified drugs, medicaments and biological substances, intentional self-harm, initial encounter: Secondary | ICD-10-CM | POA: Diagnosis present

## 2015-07-06 DIAGNOSIS — F17210 Nicotine dependence, cigarettes, uncomplicated: Secondary | ICD-10-CM | POA: Diagnosis not present

## 2015-07-06 DIAGNOSIS — T461X2D Poisoning by calcium-channel blockers, intentional self-harm, subsequent encounter: Secondary | ICD-10-CM | POA: Diagnosis not present

## 2015-07-06 DIAGNOSIS — R0600 Dyspnea, unspecified: Secondary | ICD-10-CM | POA: Diagnosis not present

## 2015-07-06 DIAGNOSIS — F1010 Alcohol abuse, uncomplicated: Secondary | ICD-10-CM | POA: Diagnosis not present

## 2015-07-06 DIAGNOSIS — T50901A Poisoning by unspecified drugs, medicaments and biological substances, accidental (unintentional), initial encounter: Secondary | ICD-10-CM | POA: Diagnosis not present

## 2015-07-06 DIAGNOSIS — F10129 Alcohol abuse with intoxication, unspecified: Secondary | ICD-10-CM | POA: Diagnosis not present

## 2015-07-06 DIAGNOSIS — Z452 Encounter for adjustment and management of vascular access device: Secondary | ICD-10-CM | POA: Insufficient documentation

## 2015-07-06 DIAGNOSIS — F332 Major depressive disorder, recurrent severe without psychotic features: Secondary | ICD-10-CM | POA: Diagnosis not present

## 2015-07-06 DIAGNOSIS — R579 Shock, unspecified: Secondary | ICD-10-CM | POA: Diagnosis present

## 2015-07-06 DIAGNOSIS — T461X2A Poisoning by calcium-channel blockers, intentional self-harm, initial encounter: Secondary | ICD-10-CM

## 2015-07-06 DIAGNOSIS — T461X1A Poisoning by calcium-channel blockers, accidental (unintentional), initial encounter: Secondary | ICD-10-CM | POA: Diagnosis not present

## 2015-07-06 DIAGNOSIS — F419 Anxiety disorder, unspecified: Secondary | ICD-10-CM | POA: Diagnosis not present

## 2015-07-06 DIAGNOSIS — R0902 Hypoxemia: Secondary | ICD-10-CM | POA: Diagnosis not present

## 2015-07-06 DIAGNOSIS — I10 Essential (primary) hypertension: Secondary | ICD-10-CM | POA: Diagnosis not present

## 2015-07-06 DIAGNOSIS — T50902D Poisoning by unspecified drugs, medicaments and biological substances, intentional self-harm, subsequent encounter: Secondary | ICD-10-CM | POA: Diagnosis not present

## 2015-07-06 DIAGNOSIS — R45851 Suicidal ideations: Secondary | ICD-10-CM | POA: Diagnosis present

## 2015-07-06 LAB — POCT I-STAT 3, ART BLOOD GAS (G3+)
Acid-base deficit: 10 mmol/L — ABNORMAL HIGH (ref 0.0–2.0)
Acid-base deficit: 6 mmol/L — ABNORMAL HIGH (ref 0.0–2.0)
BICARBONATE: 20.6 meq/L (ref 20.0–24.0)
Bicarbonate: 17.7 mEq/L — ABNORMAL LOW (ref 20.0–24.0)
O2 SAT: 94 %
O2 Saturation: 93 %
PCO2 ART: 46.6 mmHg — AB (ref 35.0–45.0)
PCO2 ART: 40.7 mmHg (ref 35.0–45.0)
PO2 ART: 86 mmHg (ref 80.0–100.0)
PO2 ART: 73 mmHg — AB (ref 80.0–100.0)
Patient temperature: 98.1
Patient temperature: 97.5
TCO2: 19 mmol/L (ref 0–100)
TCO2: 22 mmol/L (ref 0–100)
pH, Arterial: 7.187 — CL (ref 7.350–7.450)
pH, Arterial: 7.309 — ABNORMAL LOW (ref 7.350–7.450)

## 2015-07-06 LAB — RAPID URINE DRUG SCREEN, HOSP PERFORMED
AMPHETAMINES: NOT DETECTED
BENZODIAZEPINES: POSITIVE — AB
Barbiturates: NOT DETECTED
Cocaine: NOT DETECTED
OPIATES: NOT DETECTED
TETRAHYDROCANNABINOL: NOT DETECTED

## 2015-07-06 LAB — CK: Total CK: 85 U/L (ref 38–234)

## 2015-07-06 LAB — BASIC METABOLIC PANEL
ANION GAP: 14 (ref 5–15)
BUN: 22 mg/dL — ABNORMAL HIGH (ref 6–20)
CO2: 22 mmol/L (ref 22–32)
Calcium: 8 mg/dL — ABNORMAL LOW (ref 8.9–10.3)
Chloride: 107 mmol/L (ref 101–111)
Creatinine, Ser: 1.12 mg/dL — ABNORMAL HIGH (ref 0.44–1.00)
GFR calc non Af Amer: 50 mL/min — ABNORMAL LOW (ref >60)
GFR, EST AFRICAN AMERICAN: 58 mL/min — AB (ref >60)
GLUCOSE: 175 mg/dL — AB (ref 65–99)
POTASSIUM: 3.5 mmol/L (ref 3.5–5.1)
Sodium: 143 mmol/L (ref 135–145)

## 2015-07-06 LAB — LACTIC ACID, PLASMA: LACTIC ACID, VENOUS: 1.6 mmol/L (ref 0.5–2.0)

## 2015-07-06 LAB — GLUCOSE, CAPILLARY: Glucose-Capillary: 110 mg/dL — ABNORMAL HIGH (ref 65–99)

## 2015-07-06 LAB — HEPATIC FUNCTION PANEL
ALBUMIN: 3.2 g/dL — AB (ref 3.5–5.0)
ALT: 13 U/L — AB (ref 14–54)
AST: 26 U/L (ref 15–41)
Alkaline Phosphatase: 34 U/L — ABNORMAL LOW (ref 38–126)
BILIRUBIN DIRECT: 0.1 mg/dL (ref 0.1–0.5)
Indirect Bilirubin: 0.3 mg/dL (ref 0.3–0.9)
Total Bilirubin: 0.4 mg/dL (ref 0.3–1.2)
Total Protein: 5.4 g/dL — ABNORMAL LOW (ref 6.5–8.1)

## 2015-07-06 LAB — MRSA PCR SCREENING: MRSA by PCR: NEGATIVE

## 2015-07-06 LAB — MAGNESIUM: Magnesium: 1.7 mg/dL (ref 1.7–2.4)

## 2015-07-06 LAB — PHOSPHORUS: PHOSPHORUS: 4.7 mg/dL — AB (ref 2.5–4.6)

## 2015-07-06 MED ORDER — SODIUM BICARBONATE 8.4 % IV SOLN
100.0000 meq | Freq: Once | INTRAVENOUS | Status: AC
  Administered 2015-07-06: 100 meq via INTRAVENOUS
  Filled 2015-07-06: qty 100

## 2015-07-06 MED ORDER — PHENYLEPHRINE HCL 10 MG/ML IJ SOLN
0.0000 ug/min | INTRAVENOUS | Status: DC
  Filled 2015-07-06: qty 1

## 2015-07-06 MED ORDER — HEPARIN SODIUM (PORCINE) 5000 UNIT/ML IJ SOLN
5000.0000 [IU] | Freq: Three times a day (TID) | INTRAMUSCULAR | Status: DC
  Administered 2015-07-06 – 2015-07-12 (×19): 5000 [IU] via SUBCUTANEOUS
  Filled 2015-07-06 (×22): qty 1

## 2015-07-06 MED ORDER — NOREPINEPHRINE BITARTRATE 1 MG/ML IV SOLN
0.0000 ug/min | INTRAVENOUS | Status: DC
  Administered 2015-07-06: 16 ug/min via INTRAVENOUS
  Administered 2015-07-06: 44 ug/min via INTRAVENOUS
  Filled 2015-07-06 (×2): qty 16

## 2015-07-06 MED ORDER — SODIUM BICARBONATE 8.4 % IV SOLN
INTRAVENOUS | Status: AC
  Filled 2015-07-06: qty 100

## 2015-07-06 MED ORDER — CALCIUM GLUCONATE 10 % IV SOLN
1.0000 g | Freq: Once | INTRAVENOUS | Status: AC
  Administered 2015-07-06: 1 g via INTRAVENOUS
  Filled 2015-07-06: qty 10

## 2015-07-06 MED ORDER — STERILE WATER FOR INJECTION IV SOLN
INTRAVENOUS | Status: DC
  Administered 2015-07-06: 07:00:00 via INTRAVENOUS
  Filled 2015-07-06 (×3): qty 850

## 2015-07-06 MED ORDER — CETYLPYRIDINIUM CHLORIDE 0.05 % MT LIQD
7.0000 mL | Freq: Two times a day (BID) | OROMUCOSAL | Status: DC
  Administered 2015-07-06 – 2015-07-08 (×6): 7 mL via OROMUCOSAL

## 2015-07-06 MED ORDER — ONDANSETRON HCL 4 MG/2ML IJ SOLN
4.0000 mg | Freq: Four times a day (QID) | INTRAMUSCULAR | Status: DC | PRN
  Administered 2015-07-06 – 2015-07-07 (×2): 4 mg via INTRAVENOUS
  Filled 2015-07-06 (×2): qty 2

## 2015-07-06 MED ORDER — ALBUTEROL SULFATE (2.5 MG/3ML) 0.083% IN NEBU: 2.5000 mg | INHALATION_SOLUTION | RESPIRATORY_TRACT | Status: DC | PRN

## 2015-07-06 MED ORDER — SODIUM CHLORIDE 0.9 % IV SOLN: 250.0000 mL | INTRAVENOUS | Status: DC | PRN

## 2015-07-06 MED ORDER — VASOPRESSIN 20 UNIT/ML IV SOLN
0.0300 [IU]/min | INTRAVENOUS | Status: DC
  Administered 2015-07-06 – 2015-07-07 (×2): 0.03 [IU]/min via INTRAVENOUS
  Filled 2015-07-06 (×2): qty 2

## 2015-07-06 MED ORDER — POTASSIUM CHLORIDE CRYS ER 20 MEQ PO TBCR
40.0000 meq | EXTENDED_RELEASE_TABLET | Freq: Once | ORAL | Status: AC
  Administered 2015-07-06: 40 meq via ORAL
  Filled 2015-07-06 (×2): qty 2

## 2015-07-06 MED ORDER — PHENYLEPHRINE HCL 10 MG/ML IJ SOLN
0.0000 ug/min | INTRAVENOUS | Status: DC
  Filled 2015-07-06: qty 4

## 2015-07-06 MED ORDER — SODIUM CHLORIDE 0.9 % IV SOLN
INTRAVENOUS | Status: DC
  Administered 2015-07-06 – 2015-07-07 (×3): via INTRAVENOUS

## 2015-07-06 MED ORDER — CHLORHEXIDINE GLUCONATE 0.12 % MT SOLN
15.0000 mL | Freq: Two times a day (BID) | OROMUCOSAL | Status: DC
  Administered 2015-07-06 – 2015-07-07 (×2): 15 mL via OROMUCOSAL

## 2015-07-06 MED ORDER — GLUCAGON HCL RDNA (DIAGNOSTIC) 1 MG IJ SOLR
5.0000 mg | Freq: Once | INTRAVENOUS | Status: AC
  Administered 2015-07-06: 5 mg via INTRAVENOUS
  Filled 2015-07-06: qty 5

## 2015-07-06 MED ORDER — SODIUM CHLORIDE 0.9 % IV SOLN
1.0000 g | Freq: Once | INTRAVENOUS | Status: AC
  Administered 2015-07-06: 1 g via INTRAVENOUS
  Filled 2015-07-06: qty 10

## 2015-07-06 NOTE — ED Notes (Signed)
4 silver colored bracelets removed and placed in   A container with pts name label on it

## 2015-07-06 NOTE — ED Notes (Signed)
Pt asking for ice chips  Doctor gave order to give ice chips  Pt nauseated now

## 2015-07-06 NOTE — ED Notes (Signed)
Pt placed back on nasal can at 6 briefly but her sats sank below 90  Placed back on the non-rerbreather.  sats 97-99 on mask

## 2015-07-06 NOTE — Progress Notes (Signed)
Patient came from Ed with these belongings: .21 cents, driver license, ss card, medicare card, multiple credit cards, Ross, home depot, BOA BOA Visa, [employer], [employer] one, care credit, dicks, miles by discover, two [employer], [employer], sears, [employer], BPvisa, BOA debit, [place] [facility] Thank you city [facility], AAA, [place], Sheets, [place], 4 silver bracelets, 4 silver rings, 2 gold rings, 1 purple coin purse and one ring that will not come off of her finger.  One pack of cigarretes.  Light brown jacket, crocs, santa socks, grey shirt, empty pill bottles. Reviewed with another Mikle Bosworth RN

## 2015-07-06 NOTE — ED Notes (Signed)
Report given to rn on 2100

## 2015-07-06 NOTE — Procedures (Signed)
Central Venous Catheter Insertion Procedure Note DAWNE COREA [id number] 01/31/1951  Procedure: Insertion of Central Venous Catheter Indications: Assessment of intravascular volume  Procedure Details Consent: Risks of procedure as well as the alternatives and risks of each were explained to the (patient/caregiver).  Consent for procedure obtained. Time Out: Verified patient identification, verified procedure, site/side was marked, verified correct patient position, special equipment/implants available, medications/allergies/relevent history reviewed, required imaging and test results available.  Performed  Maximum sterile technique was used including antiseptics, cap, gloves, gown, hand hygiene, mask and sheet. Skin prep: Chlorhexidine; local anesthetic administered A antimicrobial bonded/coated triple lumen catheter was placed in the left internal jugular vein using the Seldinger technique. Ultrasound guidance used.Yes.   Catheter placed to 20 cm. Blood aspirated via all 3 ports and then flushed x 3. Line sutured x 2 and dressing applied.  Evaluation Blood flow good Complications: No apparent complications Patient did tolerate procedure well. Chest X-ray ordered to verify placement.  CXR: pending.  Georgann Housekeeper, AGACNP-BC [facility] Pager [phone] or [phone]  07/06/2015 1:38 AM   Baltazar Apo, MD, PhD 07/06/2015, 6:46 AM [facility] [phone] or if no answer [phone]

## 2015-07-06 NOTE — Progress Notes (Signed)
Poison control called for update. Due to high dose Levo requirements they suggest administration of calcium gluconate. If no response of increased blood pressure, give again 5 min later. If still no response, give high dose insulin. ICU resident Luiz Blare notified of suggestions.

## 2015-07-06 NOTE — Progress Notes (Signed)
[place] Progress Note Patient Name: Sarah Barnes DOB: Dec 16, 1950 MRN: [id number]   Date of Service  07/06/2015  HPI/Events of Note  ABG = 7.18/46.6/86/17.7  eICU Interventions  Will order: 1. NaHCO3 100 meq IV now. 2. NaHCO3 IV infusion to run at 50 mL/hour. 3. ABG at 12 noon.     Intervention Category Major Interventions: Acid-Base disturbance - evaluation and management  Sarah Barnes 07/06/2015, 5:58 AM

## 2015-07-06 NOTE — ED Notes (Signed)
The iv levophed is in the lt central line never was in the rt hand
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
## 2015-07-06 NOTE — ED Notes (Signed)
Central line placed  Lt neck  Chest xray portable for placement

## 2015-07-06 NOTE — H&P (Signed)
PULMONARY / CRITICAL CARE MEDICINE   Name: Sarah Barnes MRN: [id number] DOB: [birth year]    ADMISSION DATE:  07/05/2015 CONSULTATION DATE:  07/05/2015  REFERRING MD:    CHIEF COMPLAINT:  Multi-drug overdose  HISTORY OF PRESENT ILLNESS:   Sarah Barnes is a 65 y.o. female with PMH of HTN, HLD, depression, anxiety, COPD, and EtOH abuse who presented for intentional multi-drug overdose with EtOH. Patient states that this is not her first suicidal attempt. She states she did this because her sister made her angry. She states she took "all" her medications. Bottles were brought into the ED and were all empty. Medications included crestor, zocor, paxel, prilosec, prinzide, xanax, fenofibrate,zantac, naproxen, norvasc. Approximate time of ingestion about 8pm. ED called poison control. In ED patient hypotensive. Did not respond to fluid resuscitation or glucagon. Appears to be having downward trajectory. Will admit to CCM for further management and observation.  SUBJECTIVE:  Alert, oriented, and able to follow commands. Continues to be hypotensive.   VITAL SIGNS: BP 94/52 mmHg  Pulse 90  Temp(Src) 97.5 F (36.4 C) (Oral)  Resp 18  Wt 206 lb 5.6 oz (93.6 kg)  SpO2 97%  HEMODYNAMICS: CVP:  [13 mmHg] 13 mmHg  VENTILATOR SETTINGS: Vent Mode:  [-]  FiO2 (%):  [0 %] 0 %  INTAKE / OUTPUT: I/O last 3 completed shifts: In: 5745.6 [I.V.:5745.6] Out: 500 [Urine:500]  PHYSICAL EXAMINATION: General:  Well-appearing, drowsy, NAD Neuro:  A&Ox3, follows commands, normal strength and tone, grossly non-focal HEENT:  [place]/AT, [place] in place,  Cardiovascular: RRR, no m/r/g Lungs: anterio clear, mild wheezing upper apical Abdomen: +BS, soft, non-tender, no masses Musculoskeletal: no edema. Moves all extremities.  Skin:  Warm, dry, intact  LABS:  BMET  Recent Labs Lab 07/05/15 2151 07/06/15 0612  NA 140 143  K 3.1* 3.5  CL 103 107  CO2 20* 22  BUN 19 22*  CREATININE 1.02* 1.12*   GLUCOSE 106* 175*    Electrolytes  Recent Labs Lab 07/05/15 2151 07/06/15 0210 07/06/15 0612  CALCIUM 9.8  --  8.0*  MG  --  1.7  --   PHOS  --  4.7*  --     CBC  Recent Labs Lab 07/05/15 2151  WBC 10.0  HGB 14.9  HCT 43.1  PLT 260    Coag's No results for input(s): APTT, INR in the last 168 hours.  Sepsis Markers  Recent Labs Lab 07/06/15 0210  LATICACIDVEN 1.6    ABG  Recent Labs Lab 07/06/15 0552 07/06/15 1156  PHART 7.187* 7.309*  PCO2ART 46.6* 40.7  PO2ART 86.0 73.0*    Liver Enzymes  Recent Labs Lab 07/06/15 0210  AST 26  ALT 13*  ALKPHOS 34*  BILITOT 0.4  ALBUMIN 3.2*    Cardiac Enzymes No results for input(s): TROPONINI, PROBNP in the last 168 hours.  Glucose  Recent Labs Lab 07/06/15 0329  GLUCAP 110*    Imaging Dg Chest Port 1 View  07/06/2015  CLINICAL DATA:  Central line placement EXAM: PORTABLE CHEST 1 VIEW COMPARISON:  11/25/2014 FINDINGS: Interval placement of a left jugular venous catheter with tip over the low SVC region. No pneumothorax. Mild cardiac enlargement. No focal airspace disease or consolidation in the lungs. No blunting of costophrenic angles. No pneumothorax. IMPRESSION: Left central venous catheter with tip over the low SVC region. No pneumothorax. Electronically Signed   By: Lucienne Capers M.D.   On: 07/06/2015 02:00     STUDIES:  None  CULTURES: None  ANTIBIOTICS: None  SIGNIFICANT EVENTS: 2/14>> admitted for multi-drug overdose with EtOH 2.15 severe shock, acidosis  LINES/TUBES: PIV LIJ 2/15  DISCUSSION: 64 y.o. female with SI. Intentional overdose with multidrug and EtOH. Currently stable but has the possibility of decompensating.    ASSESSMENT / PLAN:  PULMONARY A: Uncompensated met acidosis At risk for decompensation in setting of overdose Hypoxemia COPD  P:   Monitor respiratory status Stat abg repeat Mental status could tolerate NIMV Currently on 4L [place] O2 to  maintain sats >92% Albuterol prn   CARDIOVASCULAR A:  Shock 2/2 antihypertensive overdose, calcium channel Hypotensive H/O HTN  P:  Repeat EKG in AM Levo drip to maintain MAP >60 Add vasopressin in setting vasoplegia Telemtry  Hold home BP meds prinzide, norvasc Provide glucagon and limited response In ca [place] OD not as effective with BB OD Add calcium gluconate and assess response, may need infusion Low threhsold insulin drip for calcium channel May push ca level to higher limits May need a line, follow trend Ensure cortisol, stress roids  RENAL A:   Acute renal injury in setting of multidrug overdose Hypokalemia AGMA - 2/2 ?renal failure, ?lactic acidosis  P:   Saline to pos balance Chem again am  abg  GASTROINTESTINAL A:   Nausea Diarrhea - at baseline  P:   Monitor for diarrhea, improved overall NPO zofran prn for nausea F/u LFTs further  HEMATOLOGIC A:   DBT prevention P:  Hep subq  INFECTIOUS A:   No acute issues- afebrile, nml WBC P:   Monitor pcxr for asp risk  ENDOCRINE A:   No acute issues   P:   Monitor blood sugars  NEUROLOGIC A:   Alert and oriented At risk for acute encephalopathy Polysubstance OD At risk Serotonin syndrome  P:   RASS goal: 0 avoid sedation UDS pending Monitor Follow-up poison control recs See [facility] for calcium, possible insulin drip   FAMILY  - Updates: No family at bedside. Patient updated on plan. - Inter-disciplinary family meet or Palliative Care meeting due by:  2/21  Ccm time 50 min  Lavon Paganini. Titus Mould, MD, [place] Pgr: [facility]

## 2015-07-06 NOTE — ED Notes (Signed)
Pt has  Had bms x3  The pt reports that this is normal for her

## 2015-07-06 NOTE — H&P (Signed)
PULMONARY / CRITICAL CARE MEDICINE   Name: Sarah Barnes MRN: [id number] DOB: 1950-10-04    ADMISSION DATE:  07/05/2015 CONSULTATION DATE:  07/05/2015  REFERRING MD:    CHIEF COMPLAINT:  Multi-drug overdose  HISTORY OF PRESENT ILLNESS:   Sarah Barnes is a 65 y.o. female with PMH of HTN, HLD, depression, anxiety, COPD, and EtOH abuse who presented for intentional multi-drug overdose with EtOH. Patient states that this is not her first suicidal attempt. She states she did this because her sister made her angry. She states she took "all" her medications. Bottles were brought into the ED and were all empty. Medications included crestor, zocor, paxel, prilosec, prinzide, xanax, fenofibrate,zantac, naproxen, norvasc. Approximate time of ingestion about 8pm. ED called poison control. In ED patient hypotensive. Did not respond to fluid resuscitation or glucagon. Appears to be having downward trajectory. Will admit to CCM for further management and observation.  PAST MEDICAL HISTORY :  She  has a past medical history of Hypertension; High cholesterol; Hernia, hiatal; Back pain; Sciatica; and ETOH abuse.  PAST SURGICAL HISTORY: She  has past surgical history that includes Back surgery; Shoulder arthroscopy; and Cholecystectomy.  No Known Allergies  No current facility-administered medications on file prior to encounter.   Current Outpatient Prescriptions on File Prior to Encounter  Medication Sig  . albuterol (PROVENTIL HFA;VENTOLIN HFA) 108 (90 Base) MCG/ACT inhaler Inhale 2 puffs into the lungs every 6 (six) hours as needed for wheezing or shortness of breath.  . ALPRAZolam (XANAX) 0.5 MG tablet 1 tab po q day as needed anxiety  . amLODipine (NORVASC) 10 MG tablet Take 1 tablet (10 mg total) by mouth daily.  Marland Kitchen azithromycin (ZITHROMAX) 250 MG tablet Take 2 tablets by mouth on day 1, followed by 1 tablet by mouth daily for 4 days.  . benzonatate (TESSALON) 100 MG capsule Take 1 capsule  (100 mg total) by mouth 3 (three) times daily as needed.  . fenofibrate micronized (LOFIBRA) 134 MG capsule TAKE 1 CAPSULE (134 MG TOTAL) BY MOUTH DAILY BEFORE BREAKFAST.  . fluticasone (FLONASE) 50 MCG/ACT nasal spray Place 2 sprays into both nostrils daily.  Marland Kitchen lisinopril-hydrochlorothiazide (PRINZIDE,ZESTORETIC) 10-12.5 MG tablet TAKE 1 TABLET BY MOUTH EVERY DAY  . naproxen (NAPROSYN) 500 MG tablet TAKE 1 TABLET (500 MG TOTAL) BY MOUTH 2 (TWO) TIMES DAILY WITH A MEAL.  Marland Kitchen neomycin-polymyxin-hydrocortisone (CORTISPORIN) otic solution Place 3 drops into the left ear 4 (four) times daily.  Marland Kitchen omeprazole (PRILOSEC) 20 MG capsule TAKE 1 CAPSULE (20 MG TOTAL) BY MOUTH DAILY.  . ranitidine (ZANTAC) 150 MG tablet TAKE 1 TABLET (150 MG TOTAL) BY MOUTH 2 (TWO) TIMES DAILY.  . rosuvastatin (CRESTOR) 40 MG tablet Take 1 tablet (40 mg total) by mouth daily.  Marland Kitchen tiZANidine (ZANAFLEX) 4 MG tablet TAKE 1&1/2 TABLET BY MOUTH AT BEDTIME AS NEEDED FOR MUSCLE SPASMS    FAMILY HISTORY:  Her indicated that her mother is deceased. She indicated that her father is deceased.   SOCIAL HISTORY: She  reports that she has been smoking Cigarettes.  She has been smoking about 2.00 packs per day. She does not have any smokeless tobacco history on file. She reports that she drinks about 25.2 oz of alcohol per week. She reports that she does not use illicit drugs.  REVIEW OF SYSTEMS:   Review of Systems  Constitutional: Negative.  HENT: Negative.  Eyes: Negative.  Respiratory: Negative.  Cardiovascular: Negative.  Gastrointestinal: Negative.  Genitourinary: Negative.  Musculoskeletal: Negative.  Skin:  Negative.  Neurological: Negative.  Endo/Heme/Allergies: Negative.  Psychiatric/Behavioral: SI.  SUBJECTIVE:  Alert, oriented, and able to follow commands. Continues to be hypotensive.   VITAL SIGNS: BP 64/50 mmHg  Pulse 72  Temp(Src) 98.7 F (37.1 C) (Axillary)  Resp 18  SpO2 100%  HEMODYNAMICS:     VENTILATOR SETTINGS:    INTAKE / OUTPUT:    PHYSICAL EXAMINATION: General:  Well-appearing, drowsy, NAD Neuro:  A&Ox3, follows commands, normal strength and tone, grossly non-focal HEENT:  [place]/AT, [place] in place,  Cardiovascular: RRR, no m/r/g Lungs: expiratory wheezes appreciated, normal work of breathing Abdomen: +BS, soft, non-tender, no masses Musculoskeletal: no edema. Moves all extremities.  Skin:  Warm, dry, intact  LABS:  BMET  Recent Labs Lab 07/05/15 2151  NA 140  K 3.1*  CL 103  CO2 20*  BUN 19  CREATININE 1.02*  GLUCOSE 106*    Electrolytes  Recent Labs Lab 07/05/15 2151  CALCIUM 9.8    CBC  Recent Labs Lab 07/05/15 2151  WBC 10.0  HGB 14.9  HCT 43.1  PLT 260    Coag's No results for input(s): APTT, INR in the last 168 hours.  Sepsis Markers No results for input(s): LATICACIDVEN, PROCALCITON, O2SATVEN in the last 168 hours.  ABG No results for input(s): PHART, PCO2ART, PO2ART in the last 168 hours.  Liver Enzymes No results for input(s): AST, ALT, ALKPHOS, BILITOT, ALBUMIN in the last 168 hours.  Cardiac Enzymes No results for input(s): TROPONINI, PROBNP in the last 168 hours.  Glucose No results for input(s): GLUCAP in the last 168 hours.  Imaging No results found.   STUDIES:  None  CULTURES: None  ANTIBIOTICS: None  SIGNIFICANT EVENTS: 2/14>> admitted for multi-drug overdose with EtOH  LINES/TUBES: PIV LIJ 2/15  DISCUSSION: 65 y.o. female with SI. Intentional overdose with multidrug and EtOH. Currently stable but has the possibility of decompensating.    ASSESSMENT / PLAN:  PULMONARY A: No acute issues At risk for decompensation in setting of overdose Hypoxemia COPD  P:   Monitor respiratory status Currently on 4L [place] O2 to maintain sats >92% Albuterol prn   CARDIOVASCULAR A:  Shock 2/2 antihypertensive overdose Hypotensive H/O HTN  P:  Repeat EKG in AM Levo drip to maintain MAP  >65 Te[place]  Hold home BP meds prinzide, norvasc  RENAL A:   Acute renal injury in setting of multidrug overdose Hypokalemia AGMA - 2/2 ?renal failure, ?lactic acidosis  P:   IVF NS @75mL ; s/p 6L NS boluses Monitor I/Os Replace potassium as needed Repeat BMP Check lactic acid Follow-up CK   GASTROINTESTINAL A:   Nausea Diarrhea - at baseline  P:   Monitor for diarrhea; consider O&P  NPO zofran prn for nausea F/u LFTs  HEMATOLOGIC A:   No acute issues P:  Hep subq  INFECTIOUS A:   No acute issues- afebrile, nml WBC P:   Monitor  ENDOCRINE A:   No acute issues   P:   Monitor blood sugars  NEUROLOGIC A:   Alert and oriented At risk for acute encephalopathy  P:   RASS goal: 0 No need for sedation at this time EtOH on admission 120 UDS pending Monitor Follow-up poison control recs   FAMILY  - Updates: No family at bedside. Patient updated on plan. - Inter-disciplinary family meet or Palliative Care meeting due by:  2/21  Luiz Blare, DO 07/06/2015, 12:40 AM PGY-2, [facility]   Attending Note:  I have examined patient, reviewed  labs, studies and notes. I have discussed the case with Dr Gerarda Fraction, and I agree with the data and plans as I have amended above. Pt with hx of depression, anxiety, HTN, EtOH abuse, states that she intentionally took all of her maintenance meds with intent to harm herself after an argument. As above she ingested Ca++ blocker, ACE-I, SSRI, benzo, NSAID, EtOH - all of which have potential risk. I evaluated the patient with the resident MD at 12:30am at which time she was awake but groggy, had adequate airway protection, was nauseated but was not having emesis. She was beginning to show signs of shock refractory to Ca, glucagon, IVF's. She will require CVC placement for pressors support until meds clear. We will follow ECG / QT-c, follow MS closely as she is at high risk for evolving encephalopathy. If she becomes  obtunded she will be intubated for airway protection. I anticipate that her renal injury will worsen. No evidence at this time to suggest APAP ingestion, but will check this if transaminases increase.  Independent critical care time is 45 minutes.   Baltazar Apo, MD, PhD 07/06/2015, 6:47 AM [facility] [phone] or if no answer [phone]

## 2015-07-06 NOTE — ED Notes (Signed)
Pt remainjs alert but drowsy.  Critical cafre here for 30 minutes.Marland Kitchen Preparing to place a central line

## 2015-07-06 NOTE — Progress Notes (Signed)
UR Completed. Vedanth Sirico, RN, BSN.  [phone] 

## 2015-07-06 NOTE — Progress Notes (Addendum)
eLink Physician-Brief Progress Note Patient Name: Sarah Barnes DOB: January 05, 1951 MRN: [id number]   Date of Service  07/06/2015  HPI/Events of Note  Hypotension - BP = 86/62 on Norepinephrine IV infusion at 35 mcg/min.   eICU Interventions  Will order: 1. Monitor CVP. 2. Increase ceiling on Norepinephrine IV infusion to 60 mcg/min. 3. Add Phenylephrine IV infusion. Titrate to MAP >= 65. 4. ABG now.      Intervention Category Major Interventions: Hypotension - evaluation and management  Rondal Vandevelde [place] 07/06/2015, 5:35 AM

## 2015-07-07 ENCOUNTER — Inpatient Hospital Stay (HOSPITAL_COMMUNITY): Payer: Medicare HMO

## 2015-07-07 DIAGNOSIS — T50901A Poisoning by unspecified drugs, medicaments and biological substances, accidental (unintentional), initial encounter: Secondary | ICD-10-CM

## 2015-07-07 DIAGNOSIS — T1491 Suicide attempt: Secondary | ICD-10-CM

## 2015-07-07 DIAGNOSIS — F332 Major depressive disorder, recurrent severe without psychotic features: Secondary | ICD-10-CM

## 2015-07-07 DIAGNOSIS — F10129 Alcohol abuse with intoxication, unspecified: Secondary | ICD-10-CM

## 2015-07-07 DIAGNOSIS — N179 Acute kidney failure, unspecified: Secondary | ICD-10-CM

## 2015-07-07 DIAGNOSIS — T461X1A Poisoning by calcium-channel blockers, accidental (unintentional), initial encounter: Secondary | ICD-10-CM

## 2015-07-07 DIAGNOSIS — F411 Generalized anxiety disorder: Secondary | ICD-10-CM

## 2015-07-07 DIAGNOSIS — Z452 Encounter for adjustment and management of vascular access device: Secondary | ICD-10-CM

## 2015-07-07 DIAGNOSIS — F329 Major depressive disorder, single episode, unspecified: Secondary | ICD-10-CM

## 2015-07-07 DIAGNOSIS — R45851 Suicidal ideations: Secondary | ICD-10-CM

## 2015-07-07 LAB — CBC
HCT: 32.6 % — ABNORMAL LOW (ref 36.0–46.0)
HEMOGLOBIN: 10.9 g/dL — AB (ref 12.0–15.0)
MCH: 32.4 pg (ref 26.0–34.0)
MCHC: 33.4 g/dL (ref 30.0–36.0)
MCV: 97 fL (ref 78.0–100.0)
PLATELETS: 158 10*3/uL (ref 150–400)
RBC: 3.36 MIL/uL — AB (ref 3.87–5.11)
RDW: 13.2 % (ref 11.5–15.5)
WBC: 8.1 10*3/uL (ref 4.0–10.5)

## 2015-07-07 LAB — COMPREHENSIVE METABOLIC PANEL
ALT: 11 U/L — AB (ref 14–54)
AST: 14 U/L — AB (ref 15–41)
Albumin: 3.1 g/dL — ABNORMAL LOW (ref 3.5–5.0)
Alkaline Phosphatase: 32 U/L — ABNORMAL LOW (ref 38–126)
Anion gap: 11 (ref 5–15)
BUN: 12 mg/dL (ref 6–20)
CHLORIDE: 105 mmol/L (ref 101–111)
CO2: 27 mmol/L (ref 22–32)
CREATININE: 0.67 mg/dL (ref 0.44–1.00)
Calcium: 8.8 mg/dL — ABNORMAL LOW (ref 8.9–10.3)
GFR calc Af Amer: >60 mL/min (ref >60)
Glucose, Bld: 121 mg/dL — ABNORMAL HIGH (ref 65–99)
POTASSIUM: 3.4 mmol/L — AB (ref 3.5–5.1)
SODIUM: 143 mmol/L (ref 135–145)
Total Bilirubin: 1 mg/dL (ref 0.3–1.2)
Total Protein: 5.2 g/dL — ABNORMAL LOW (ref 6.5–8.1)

## 2015-07-07 LAB — CORTISOL: CORTISOL PLASMA: 15.9 ug/dL

## 2015-07-07 MED ORDER — POTASSIUM CHLORIDE CRYS ER 20 MEQ PO TBCR
40.0000 meq | EXTENDED_RELEASE_TABLET | Freq: Two times a day (BID) | ORAL | Status: DC
Start: 2015-07-07 — End: 2015-07-08
  Administered 2015-07-07 (×2): 40 meq via ORAL
  Filled 2015-07-07 (×4): qty 2

## 2015-07-07 MED ORDER — PNEUMOCOCCAL VAC POLYVALENT 25 MCG/0.5ML IJ INJ
0.5000 mL | INJECTION | INTRAMUSCULAR | Status: AC
  Administered 2015-07-08: 0.5 mL via INTRAMUSCULAR
  Filled 2015-07-07: qty 0.5

## 2015-07-07 MED ORDER — FUROSEMIDE 10 MG/ML IJ SOLN
40.0000 mg | Freq: Three times a day (TID) | INTRAMUSCULAR | Status: DC
  Administered 2015-07-07 – 2015-07-09 (×6): 40 mg via INTRAVENOUS
  Filled 2015-07-07 (×8): qty 4

## 2015-07-07 MED ORDER — ACETAMINOPHEN 325 MG PO TABS
650.0000 mg | ORAL_TABLET | Freq: Once | ORAL | Status: AC
  Administered 2015-07-07: 650 mg via ORAL
  Filled 2015-07-07: qty 2

## 2015-07-07 MED ORDER — PANTOPRAZOLE SODIUM 40 MG IV SOLR
40.0000 mg | INTRAVENOUS | Status: DC
  Administered 2015-07-07 – 2015-07-08 (×2): 40 mg via INTRAVENOUS
  Filled 2015-07-07 (×3): qty 40

## 2015-07-07 MED ORDER — IPRATROPIUM-ALBUTEROL 0.5-2.5 (3) MG/3ML IN SOLN
3.0000 mL | Freq: Four times a day (QID) | RESPIRATORY_TRACT | Status: DC
  Administered 2015-07-07 – 2015-07-09 (×11): 3 mL via RESPIRATORY_TRACT
  Filled 2015-07-07 (×13): qty 3

## 2015-07-07 MED ORDER — INFLUENZA VAC SPLIT QUAD 0.5 ML IM SUSY
0.5000 mL | PREFILLED_SYRINGE | INTRAMUSCULAR | Status: AC
  Administered 2015-07-08: 0.5 mL via INTRAMUSCULAR
  Filled 2015-07-07: qty 0.5

## 2015-07-07 MED ORDER — FUROSEMIDE 10 MG/ML IJ SOLN
40.0000 mg | Freq: Three times a day (TID) | INTRAMUSCULAR | Status: DC
  Filled 2015-07-07: qty 4
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
## 2015-07-07 NOTE — Progress Notes (Signed)
PULMONARY / CRITICAL CARE MEDICINE   Name: Sarah Barnes MRN: [id number] DOB: Oct 09, 1950    ADMISSION DATE:  07/05/2015 CONSULTATION DATE:  07/05/2015  REFERRING MD:    CHIEF COMPLAINT:  Multi-drug overdose  HISTORY OF PRESENT ILLNESS:   Sarah Barnes is a 65 y.o. female with PMH of HTN, HLD, depression, anxiety, COPD, and EtOH abuse who presented for intentional multi-drug overdose with EtOH. Patient states that this is not her first suicidal attempt. She states she did this because her sister made her angry. She states she took "all" her medications. Bottles were brought into the ED and were all empty. Medications included crestor, zocor, paxel, prilosec, prinzide, xanax, fenofibrate,zantac, naproxen, norvasc. Approximate time of ingestion about 8pm. ED called poison control. In ED patient hypotensive. Did not respond to fluid resuscitation or glucagon. Appears to be having downward trajectory. Will admit to CCM for further management and observation.  SUBJECTIVE:  Alert, oriented, and able to follow commands.  Continues to be hypotensive.  Had some episodes of desat yesterday so placed on venturi mask Requesting to eat  VITAL SIGNS: BP 69/55 mmHg  Pulse 63  Temp(Src) 97.4 F (36.3 C) (Axillary)  Resp 20  Wt 211 lb 13.8 oz (96.1 kg)  SpO2 93%  HEMODYNAMICS: CVP:  [13 mmHg-18 mmHg] 13 mmHg  VENTILATOR SETTINGS:    INTAKE / OUTPUT: I/O last 3 completed shifts: In: 8071.1 [I.V.:7961.1; IV Piggyback:110] Out: 1000 [Urine:1000]  PHYSICAL EXAMINATION: General:  Well-appearing, alert, NAD Neuro:  A&Ox3, follows commands, normal strength and tone, grossly non-focal HEENT:  [place]/AT, venturi mask Cardiovascular: RRR, no m/r/g Lungs: anterio clear, wheezing diffusely Abdomen: +BS, soft, non-tender, no masses Musculoskeletal: no edema. Moves all extremities.  Skin:  Warm, dry, intact  LABS:  BMET  Recent Labs Lab 07/05/15 2151 07/06/15 0612 07/07/15 0525  NA 140  143 143  K 3.1* 3.5 3.4*  CL 103 107 105  CO2 20* 22 27  BUN 19 22* 12  CREATININE 1.02* 1.12* 0.67  GLUCOSE 106* 175* 121*    Electrolytes  Recent Labs Lab 07/05/15 2151 07/06/15 0210 07/06/15 0612 07/07/15 0525  CALCIUM 9.8  --  8.0* 8.8*  MG  --  1.7  --   --   PHOS  --  4.7*  --   --     CBC  Recent Labs Lab 07/05/15 2151 07/07/15 0525  WBC 10.0 8.1  HGB 14.9 10.9*  HCT 43.1 32.6*  PLT 260 158    Coag's No results for input(s): APTT, INR in the last 168 hours.  Sepsis Markers  Recent Labs Lab 07/06/15 0210  LATICACIDVEN 1.6    ABG  Recent Labs Lab 07/06/15 0552 07/06/15 1156  PHART 7.187* 7.309*  PCO2ART 46.6* 40.7  PO2ART 86.0 73.0*    Liver Enzymes  Recent Labs Lab 07/06/15 0210 07/07/15 0525  AST 26 14*  ALT 13* 11*  ALKPHOS 34* 32*  BILITOT 0.4 1.0  ALBUMIN 3.2* 3.1*    Cardiac Enzymes No results for input(s): TROPONINI, PROBNP in the last 168 hours.  Glucose  Recent Labs Lab 07/06/15 0329  GLUCAP 110*    Imaging No results found.   STUDIES:  None  CULTURES: None  ANTIBIOTICS: None  SIGNIFICANT EVENTS: 2/14>> admitted for multi-drug overdose with EtOH 2.15 severe shock, acidosis  LINES/TUBES: PIV [facility] 2/15  DISCUSSION: 65 y.o. female with SI. Intentional overdose with multidrug and EtOH. Currently stable but has the possibility of decompensating.    ASSESSMENT / PLAN:  PULMONARY A: Uncompensated met acidosis - Improved At risk for decompensation in setting of overdose Hypoxemia COPD  P:   Monitor respiratory status desat overnight so placed on venturi mask O2 to maintain sats >92% Albuterol prn  duonebs q6h  CARDIOVASCULAR A:  Shock 2/2 antihypertensive overdose, calcium channel Hypotensive H/O HTN  P:  Levo drip to maintain MAP >60; try to wean Add vasopressin in setting vasoplegia Telemtry  Hold home BP meds prinzide, norvasc Provided glucagon and limited response S/p calcium  gluconate 2g Low threhsold insulin drip for calcium channel Cortisol wnl, hold stress steriods  RENAL A:   Acute renal injury in setting of multidrug overdose - resolved Hypokalemia AGMA - 2/2 ?renal failure, ?lactic acidosis - resolved  P:   pos balance BMP daily Replace electrolytes prn  GASTROINTESTINAL A:   Nausea Diarrhea - at baseline  P:   Monitor for diarrhea, improved overall RN swallow study then feed zofran prn for nausea F/u LFTs further - stable  HEMATOLOGIC A:   DBT prevention P:  Hep subq  INFECTIOUS A:   No acute issues- afebrile, nml WBC P:   Monitor pcxr for asp risk  ENDOCRINE A:   No acute issues   P:   Monitor blood sugars  NEUROLOGIC A:   Alert and oriented At risk for acute encephalopathy Polysubstance OD At risk Serotonin syndrome  P:   RASS goal: 0 avoid sedation UDS + benzo Monitor Follow-up poison control recs See [facility] for calcium, possible insulin drip Consult psych  FAMILY  - Updates: No family at bedside. Patient updated on plan. - Inter-disciplinary family meet or Palliative Care meeting due by:  2/21   Luiz Blare, DO 07/07/2015, 8:52 AM PGY-2, [facility]    STAFF NOTE: I, Merrie Roof, MD FACP have personally reviewed patient's available data, including medical history, events of note, physical examination and test results as part of my evaluation. I have discussed with resident/NP and other care providers such as pharmacist, RN and RRT. In addition, I personally evaluated patient and elicited key findings of: IMproved BP, mental status is normal, lungs have crackles, she is on 100% nrb, pcxr c/w edema, has pos balance, she improved with calcium well, can limit now that BP wnl, start lasix to neg 2 liters goals, re assess chem in am , lower O2 as able to goal 50%, call psych evaluation, maintain precautions and sitter, K supp , pcxr follow up, no role NIMV at this stage, i updated pt in  full The patient is critically ill with multiple organ systems failure and requires high complexity decision making for assessment and support, frequent evaluation and titration of therapies, application of advanced monitoring technologies and extensive interpretation of multiple databases.   Critical Care Time devoted to patient care services described in this note is 30 Minutes. This time reflects time of care of this signee: Merrie Roof, MD FACP. This critical care time does not reflect procedure time, or teaching time or supervisory time of PA/NP/Med student/Med Resident etc but could involve care discussion time. Rest per NP/medical resident whose note is outlined above and that I agree with   Lavon Paganini. Titus Mould, MD, [place] Pgr: [facility] 07/07/2015 3:05 PM

## 2015-07-08 ENCOUNTER — Inpatient Hospital Stay (HOSPITAL_COMMUNITY): Payer: Medicare HMO

## 2015-07-08 DIAGNOSIS — R0600 Dyspnea, unspecified: Secondary | ICD-10-CM

## 2015-07-08 DIAGNOSIS — T50902A Poisoning by unspecified drugs, medicaments and biological substances, intentional self-harm, initial encounter: Secondary | ICD-10-CM | POA: Diagnosis not present

## 2015-07-08 LAB — COMPREHENSIVE METABOLIC PANEL
ALT: 10 U/L — ABNORMAL LOW (ref 14–54)
ANION GAP: 11 (ref 5–15)
AST: 16 U/L (ref 15–41)
Albumin: 2.9 g/dL — ABNORMAL LOW (ref 3.5–5.0)
Alkaline Phosphatase: 36 U/L — ABNORMAL LOW (ref 38–126)
BILIRUBIN TOTAL: 0.8 mg/dL (ref 0.3–1.2)
BUN: <5 mg/dL — ABNORMAL LOW (ref 6–20)
CHLORIDE: 99 mmol/L — AB (ref 101–111)
CO2: 33 mmol/L — ABNORMAL HIGH (ref 22–32)
Calcium: 9.2 mg/dL (ref 8.9–10.3)
Creatinine, Ser: 0.66 mg/dL (ref 0.44–1.00)
GFR calc Af Amer: >60 mL/min (ref >60)
Glucose, Bld: 105 mg/dL — ABNORMAL HIGH (ref 65–99)
POTASSIUM: 2.9 mmol/L — AB (ref 3.5–5.1)
Sodium: 143 mmol/L (ref 135–145)
TOTAL PROTEIN: 5.6 g/dL — AB (ref 6.5–8.1)

## 2015-07-08 LAB — CBC
HEMATOCRIT: 34.5 % — AB (ref 36.0–46.0)
HEMOGLOBIN: 11.3 g/dL — AB (ref 12.0–15.0)
MCH: 31.5 pg (ref 26.0–34.0)
MCHC: 32.8 g/dL (ref 30.0–36.0)
MCV: 96.1 fL (ref 78.0–100.0)
Platelets: 151 10*3/uL (ref 150–400)
RBC: 3.59 MIL/uL — AB (ref 3.87–5.11)
RDW: 12.9 % (ref 11.5–15.5)
WBC: 8.4 10*3/uL (ref 4.0–10.5)

## 2015-07-08 LAB — MAGNESIUM: Magnesium: 1.6 mg/dL — ABNORMAL LOW (ref 1.7–2.4)

## 2015-07-08 LAB — TROPONIN I: Troponin I: 0.03 ng/mL (ref <0.031)

## 2015-07-08 MED ORDER — MAGNESIUM SULFATE 4 GM/100ML IV SOLN
4.0000 g | Freq: Once | INTRAVENOUS | Status: DC
  Filled 2015-07-08: qty 100

## 2015-07-08 MED ORDER — MAGNESIUM SULFATE 2 GM/50ML IV SOLN
2.0000 g | Freq: Once | INTRAVENOUS | Status: AC
  Administered 2015-07-08: 2 g via INTRAVENOUS
  Filled 2015-07-08: qty 50

## 2015-07-08 MED ORDER — POTASSIUM CHLORIDE CRYS ER 20 MEQ PO TBCR
40.0000 meq | EXTENDED_RELEASE_TABLET | Freq: Three times a day (TID) | ORAL | Status: DC
  Administered 2015-07-08 – 2015-07-09 (×4): 40 meq via ORAL
  Filled 2015-07-08 (×5): qty 2

## 2015-07-08 MED ORDER — POTASSIUM CHLORIDE CRYS ER 20 MEQ PO TBCR: 40.0000 meq | EXTENDED_RELEASE_TABLET | Freq: Two times a day (BID) | ORAL | Status: DC

## 2015-07-08 NOTE — Progress Notes (Addendum)
PULMONARY / CRITICAL CARE MEDICINE   Name: Sarah Barnes MRN: [id number] DOB: 21-Nov-1950    ADMISSION DATE:  07/05/2015 CONSULTATION DATE:  07/05/2015  REFERRING MD:    CHIEF COMPLAINT:  Multi-drug overdose  HISTORY OF PRESENT ILLNESS:   Sarah Barnes is a 65 y.o. female with PMH of HTN, HLD, depression, anxiety, COPD, and EtOH abuse who presented for intentional multi-drug overdose with EtOH. Patient states that this is not her first suicidal attempt. She states she did this because her sister made her angry. She states she took "all" her medications. Bottles were brought into the ED and were all empty. Medications included crestor, zocor, paxel, prilosec, prinzide, xanax, fenofibrate,zantac, naproxen, norvasc. Approximate time of ingestion about 8pm. ED called poison control. In ED patient hypotensive. Did not respond to fluid resuscitation or glucagon. Appears to be having downward trajectory. Will admit to CCM for further management and observation.  SUBJECTIVE: Alert, oriented, and able to follow commands.  Continues to have poor oxygenation with desats but overall improved O2 needs  VITAL SIGNS: BP 102/53 mmHg  Pulse 73  Temp(Src) 87.4 F (30.8 C) (Oral)  Resp 19  Wt 211 lb 13.8 oz (96.1 kg)  SpO2 100%  HEMODYNAMICS: CVP:  [14 mmHg-15 mmHg] 15 mmHg  VENTILATOR SETTINGS: Vent Mode:  [-]  FiO2 (%):  [50 %-100 %] 100 %  INTAKE / OUTPUT: I/O last 3 completed shifts: In: 2990.8 [P.O.:820; I.V.:2060.8; IV Piggyback:110] Out: 7322 [Urine:5550; Stool:1]  PHYSICAL EXAMINATION: General:  Well-appearing, alert, NAD Neuro:  A&Ox3, follows commands, normal strength and tone, grossly non-focal HEENT:  [place]/AT, venturi mask Cardiovascular: RRR, no m/r/g Lungs: anterio clear, expiratory wheezes at bases Abdomen: +BS, soft, non-tender, no masses Musculoskeletal: no edema. Moves all extremities.  Skin:  Warm, dry, intact  LABS:  BMET  Recent Labs Lab 07/06/15 0612  07/07/15 0525 07/08/15 0542  NA 143 143 143  K 3.5 3.4* 2.9*  CL 107 105 99*  CO2 22 27 33*  BUN 22* 12 <5*  CREATININE 1.12* 0.67 0.66  GLUCOSE 175* 121* 105*    Electrolytes  Recent Labs Lab 07/06/15 0210 07/06/15 0612 07/07/15 0525 07/08/15 0542  CALCIUM  --  8.0* 8.8* 9.2  MG 1.7  --   --  1.6*  PHOS 4.7*  --   --   --     CBC  Recent Labs Lab 07/05/15 2151 07/07/15 0525 07/08/15 0542  WBC 10.0 8.1 8.4  HGB 14.9 10.9* 11.3*  HCT 43.1 32.6* 34.5*  PLT 260 158 151    Coag's No results for input(s): APTT, INR in the last 168 hours.  Sepsis Markers  Recent Labs Lab 07/06/15 0210  LATICACIDVEN 1.6    ABG  Recent Labs Lab 07/06/15 0552 07/06/15 1156  PHART 7.187* 7.309*  PCO2ART 46.6* 40.7  PO2ART 86.0 73.0*    Liver Enzymes  Recent Labs Lab 07/06/15 0210 07/07/15 0525 07/08/15 0542  AST 26 14* 16  ALT 13* 11* 10*  ALKPHOS 34* 32* 36*  BILITOT 0.4 1.0 0.8  ALBUMIN 3.2* 3.1* 2.9*    Cardiac Enzymes No results for input(s): TROPONINI, PROBNP in the last 168 hours.  Glucose  Recent Labs Lab 07/06/15 0329  GLUCAP 110*    Imaging No results found.   STUDIES:  None  CULTURES: None  ANTIBIOTICS: None  SIGNIFICANT EVENTS: 2/14>> admitted for multi-drug overdose with EtOH 2.15 severe shock, acidosis  LINES/TUBES: PIV [facility] 2/15  DISCUSSION: 65 y.o. female with SI. Intentional overdose  with multidrug and EtOH. Currently stable but has the possibility of decompensating.    ASSESSMENT / PLAN:  PULMONARY A: Uncompensated met acidosis - Improved At risk for decompensation in setting of overdose Hypoxemia COPD  P:   Monitor respiratory status desat overnight so placed on venturi mask, nasal cannula was successful  O2 to maintain sats >92% Albuterol prn  duonebs q6h  CARDIOVASCULAR A:  Shock 2/2 antihypertensive overdose, calcium channel Hypotensive H/O HTN  P:  Add vasopressin in setting  vasoplegia Telemtry  Trop x 1 Hold home BP meds prinzide, norvasc Echo needed with such pulm edema  RENAL A:   Acute renal injury in setting of multidrug overdose - resolved Hypokalemia pulm edema  P:   Neg 4 liters repeat that today  BMP daily Replace electrolytes prn, aggressive Mag supp k supp  GASTROINTESTINAL A:   Nausea resolved P:   Monitor for diarrhea, improved overall zofran prn for nausea F/u LFTs further - stable  HEMATOLOGIC A:   DBT prevention P:  Hep subq, until ambualtion  INFECTIOUS A:   No acute issues- afebrile, nml WBC P:   Monitor pcxr for asp risk  ENDOCRINE A:   No acute issues   P:   Monitor blood sugars  NEUROLOGIC A:   Alert and oriented At risk for acute encephalopathy Polysubstance OD At risk Serotonin syndrome  P:   RASS goal: 0 avoid sedation UDS + benzo Monitor Follow-up poison control recs See [facility] for calcium, possible insulin drip Consult psych - Recommend psychiatric Inpatient admission when medically cleared.  FAMILY  - Updates: No family at bedside. Patient updated on plan. - Inter-disciplinary family meet or Palliative Care meeting due by:  2/21   Luiz Blare, DO 07/08/2015, 8:01 AM PGY-2, [facility]   STAFF NOTE: I, Merrie Roof, MD FACP have personally reviewed patient's available data, including medical history, events of note, physical examination and test results as part of my evaluation. I have discussed with resident/NP and other care providers such as pharmacist, RN and RRT. In addition, I personally evaluated patient and elicited key findings of: neg 4 liters, awake drinking coffee without distress, lungs improved crackles, such edema would r/o underlying cardiomyopathy, may have been of course ca channel OD causing and fluids, get echo, renal fxn improved with lasix, improved overall but needs further lasix top neg balance, get pcxr now, to [facility], sdu, psych to see   She will  see now, cannot leave hospital, dc line neck, consider to high flow O2 reservoir O2 Pgr: [phone] [facility] 07/08/2015 9:05 AM

## 2015-07-08 NOTE — Care Management Important Message (Signed)
Important Message  Patient Details  Name: Sarah Barnes MRN: [id number] Date of Birth: 11/24/1950   Medicare Important Message Given:  Yes    Nathen May 07/08/2015, 2:11 PM

## 2015-07-08 NOTE — Progress Notes (Addendum)
[facility] ADULT ICU REPLACEMENT PROTOCOL FOR AM LAB REPLACEMENT ONLY  The patient does apply for the [facility] Adult ICU Electrolyte Replacment Protocol based on the criteria listed below:   1. Is GFR >/= 40 ml/min? Yes.    Patient's GFR today is >60 2. Is urine output >/= 0.5 ml/kg/hr for the last 6 hours? Yes.   Patient's UOP is 2.9 ml/kg/hr 3. Is BUN < 60 mg/dL? Yes.    Patient's BUN today is <5 4. Abnormal electrolyte(s):  Mg - 1.6 &  Dr. Oletta Darter notified of K - 2.9 5. Ordered repletion with: PER PROTOCOL 6. If a panic level lab has been reported, has the CCM MD in charge been notified? Yes.  .   Physician:  Dr. Arcelia Jew 07/08/2015 6:19 AM

## 2015-07-08 NOTE — Progress Notes (Signed)
  Echocardiogram 2D Echocardiogram has been performed.  Sarah Barnes 07/08/2015, 1:37 PM

## 2015-07-09 DIAGNOSIS — T461X2D Poisoning by calcium-channel blockers, intentional self-harm, subsequent encounter: Secondary | ICD-10-CM

## 2015-07-09 LAB — BASIC METABOLIC PANEL
ANION GAP: 12 (ref 5–15)
BUN: 9 mg/dL (ref 6–20)
CALCIUM: 9.4 mg/dL (ref 8.9–10.3)
CO2: 28 mmol/L (ref 22–32)
Chloride: 99 mmol/L — ABNORMAL LOW (ref 101–111)
Creatinine, Ser: 0.79 mg/dL (ref 0.44–1.00)
GFR calc Af Amer: >60 mL/min (ref >60)
GFR calc non Af Amer: >60 mL/min (ref >60)
GLUCOSE: 130 mg/dL — AB (ref 65–99)
Potassium: 3.6 mmol/L (ref 3.5–5.1)
Sodium: 139 mmol/L (ref 135–145)

## 2015-07-09 LAB — RENAL FUNCTION PANEL
ALBUMIN: 3 g/dL — AB (ref 3.5–5.0)
ANION GAP: 11 (ref 5–15)
BUN: 9 mg/dL (ref 6–20)
CALCIUM: 9 mg/dL (ref 8.9–10.3)
CO2: 31 mmol/L (ref 22–32)
CREATININE: 0.83 mg/dL (ref 0.44–1.00)
Chloride: 99 mmol/L — ABNORMAL LOW (ref 101–111)
GFR calc Af Amer: >60 mL/min (ref >60)
GFR calc non Af Amer: >60 mL/min (ref >60)
GLUCOSE: 110 mg/dL — AB (ref 65–99)
Phosphorus: 4.2 mg/dL (ref 2.5–4.6)
Potassium: 3.3 mmol/L — ABNORMAL LOW (ref 3.5–5.1)
SODIUM: 141 mmol/L (ref 135–145)

## 2015-07-09 LAB — CBC
HCT: 36.9 % (ref 36.0–46.0)
Hemoglobin: 12.2 g/dL (ref 12.0–15.0)
MCH: 31.6 pg (ref 26.0–34.0)
MCHC: 33.1 g/dL (ref 30.0–36.0)
MCV: 95.6 fL (ref 78.0–100.0)
PLATELETS: 199 10*3/uL (ref 150–400)
RBC: 3.86 MIL/uL — ABNORMAL LOW (ref 3.87–5.11)
RDW: 12.8 % (ref 11.5–15.5)
WBC: 7.9 10*3/uL (ref 4.0–10.5)

## 2015-07-09 LAB — MAGNESIUM: MAGNESIUM: 2 mg/dL (ref 1.7–2.4)

## 2015-07-09 MED ORDER — BENZONATATE 100 MG PO CAPS
100.0000 mg | ORAL_CAPSULE | Freq: Three times a day (TID) | ORAL | Status: DC | PRN
  Filled 2015-07-09: qty 1

## 2015-07-09 MED ORDER — ACETAMINOPHEN 325 MG PO TABS
650.0000 mg | ORAL_TABLET | Freq: Four times a day (QID) | ORAL | Status: DC | PRN
  Administered 2015-07-09 – 2015-07-12 (×5): 650 mg via ORAL
  Filled 2015-07-09 (×5): qty 2

## 2015-07-09 MED ORDER — FUROSEMIDE 10 MG/ML IJ SOLN
40.0000 mg | Freq: Two times a day (BID) | INTRAMUSCULAR | Status: DC
  Administered 2015-07-09 – 2015-07-10 (×2): 40 mg via INTRAVENOUS
  Filled 2015-07-09 (×2): qty 4

## 2015-07-09 MED ORDER — PANTOPRAZOLE SODIUM 40 MG PO TBEC
40.0000 mg | DELAYED_RELEASE_TABLET | Freq: Every day | ORAL | Status: DC
  Administered 2015-07-09 – 2015-07-12 (×4): 40 mg via ORAL
  Filled 2015-07-09 (×4): qty 1

## 2015-07-09 MED ORDER — POTASSIUM CHLORIDE CRYS ER 20 MEQ PO TBCR
40.0000 meq | EXTENDED_RELEASE_TABLET | Freq: Two times a day (BID) | ORAL | Status: DC
  Administered 2015-07-09: 40 meq via ORAL
  Filled 2015-07-09 (×2): qty 2

## 2015-07-09 NOTE — Progress Notes (Signed)
[place] TEAM 1 - Stepdown/ICU TEAM PROGRESS NOTE  Sarah Barnes [id number] DOB: 03-02-1951 DOA: 07/05/2015 PCP: Mackie Pai, PA-C  Admit HPI / Brief Narrative: 65 y.o. female with Hx of HTN, HLD, depression, anxiety, COPD, and EtOH abuse who presented for intentional multi-drug overdose with EtOH. Patient stated this was not her first suicidal attempt. She stated she did this because her sister made her angry. She reported taking "all" her medications. Bottles were brought into the ED and were all empty. Medications included crestor, zocor, paxel, prilosec, prinzide, xanax, fenofibrate,zantac, naproxen, norvasc. Approximate time of ingestion about 8pm. ED called poison control. In ED patient hypotensive. Did not respond to fluid resuscitation or glucagon. Appeared to be having a downward trajectory. Admitted to ICU per PCCM.  HPI/Subjective: The patient is resting comfortably in bed.  She denies shortness of breath chest pain fevers chills nausea or vomiting.  Her only complaint is "feeling a little tired."  Assessment/Plan:  Polysubstance OD Appears to be stabilizing - will be ready for Psych dispo once weaned off O2  Shock 2/2 antihypertensive overdose (calcium channel blocker) BP has now stabilized w/o need for pressor - follow   Suicide attempt Psych recommended psychiatric inpatient admission when medically cleared - cont suicide precautions - pt is NOT allowed to leave the hospital AMA  Uncompensated met acidosis Appears to have resolved - f/u this afternoon and in AM  Hypokalemia Cont to replace and follow - Mg normal   Hypoxemia - Pulm Edema  Possibly due to volume overload + atx + sedation induced hypoventilation + COPD - improving - pt is not on home O2 - wean to RA as able - no evidence of severe CHF on TTE   COPD No evidence of acute flair - follow  H/O HTN Following w/o BP meds for now    Acute renal injury in setting of multidrug  overdose Resolved  Nausea resolved  Code Status: FULL Family Communication: no family present at time of exam Disposition Plan: stable for transfer to tele bed - wean to RA as able - anticipate clearance medically within 24-48hrs   Consultants: PCCM Psychiatry   Procedures: TTE - 2/17 - EF 60-65 percent - no regional wall motion abnormalities - grade 1 diastolic dysfunction  Antibiotics: none  DVT prophylaxis: SQ heparin  Objective: Blood pressure 140/70, pulse 91, temperature 97.3 F (36.3 C), temperature source Oral, resp. rate 17, weight 87.3 kg (192 lb 7.4 oz), SpO2 95 %.  Intake/Output Summary (Last 24 hours) at 07/09/15 1039 Last data filed at 07/09/15 0900  Gross per 24 hour  Intake   1020 ml  Output   2410 ml  Net  -1390 ml   Exam: General: No acute respiratory distress in bed w/ O2 Lungs: Mild bibasilar crackles with no wheezing good air movement throughout other fields Cardiovascular: Regular rate and rhythm without murmur gallop or rub normal S1 and S2 Abdomen: Nontender, nondistended, soft, bowel sounds positive, no rebound, no ascites, no appreciable mass Extremities: No significant cyanosis or clubbing;  Trace edema bilateral lower extremities  Data Reviewed:  Basic Metabolic Panel:  Recent Labs Lab 07/05/15 2151 07/06/15 0210 07/06/15 0612 07/07/15 0525 07/08/15 0542 07/09/15 0228  NA 140  --  143 143 143 141  K 3.1*  --  3.5 3.4* 2.9* 3.3*  CL 103  --  107 105 99* 99*  CO2 20*  --  22 27 33* 31  GLUCOSE 106*  --  175* 121* 105* 110*  BUN 19  --  22* 12 <5* 9  CREATININE 1.02*  --  1.12* 0.67 0.66 0.83  CALCIUM 9.8  --  8.0* 8.8* 9.2 9.0  MG  --  1.7  --   --  1.6* 2.0  PHOS  --  4.7*  --   --   --  4.2    CBC:  Recent Labs Lab 07/05/15 2151 07/07/15 0525 07/08/15 0542 07/09/15 0228  WBC 10.0 8.1 8.4 7.9  NEUTROABS 4.2  --   --   --   HGB 14.9 10.9* 11.3* 12.2  HCT 43.1 32.6* 34.5* 36.9  MCV 93.5 97.0 96.1 95.6  PLT 260 158  151 199   Liver Function Tests:  Recent Labs Lab 07/06/15 0210 07/07/15 0525 07/08/15 0542 07/09/15 0228  AST 26 14* 16  --   ALT 13* 11* 10*  --   ALKPHOS 34* 32* 36*  --   BILITOT 0.4 1.0 0.8  --   PROT 5.4* 5.2* 5.6*  --   ALBUMIN 3.2* 3.1* 2.9* 3.0*   Coags: No results for input(s): INR in the last 168 hours.  Invalid input(s): PT No results for input(s): APTT in the last 168 hours.  Cardiac Enzymes:  Recent Labs Lab 07/06/15 0210 07/08/15 1005  CKTOTAL 85  --   TROPONINI  --  0.03    CBG:  Recent Labs Lab 07/06/15 0329  GLUCAP 110*    Recent Results (from the past 240 hour(s))  MRSA PCR Screening     Status: None   Collection Time: 07/06/15  5:49 AM  Result Value Ref Range Status   MRSA by PCR NEGATIVE NEGATIVE Final    Comment:        The GeneXpert MRSA Assay (FDA approved for NASAL specimens only), is one component of a comprehensive MRSA colonization surveillance program. It is not intended to diagnose MRSA infection nor to guide or monitor treatment for MRSA infections.      Studies:   Recent x-ray studies have been reviewed in detail by the Attending Physician  Scheduled Meds:  Scheduled Meds: . antiseptic oral rinse  7 mL Mouth Rinse q12n4p  . chlorhexidine  15 mL Mouth Rinse BID  . furosemide  40 mg Intravenous Q8H  . heparin  5,000 Units Subcutaneous 3 times per day  . ipratropium-albuterol  3 mL Nebulization Q6H  . pantoprazole (PROTONIX) IV  40 mg Intravenous Q24H  . potassium chloride  40 mEq Oral TID    Time spent on care of this patient: 35 mins   Sarah Barnes , MD   [facility] Office  [phone] Pager - Text Page per Shea Evans as per below:  On-Call/Text Page:      [email or web address]      password [email or web address]  If 7PM-7AM, please contact night-coverage [email or web address] Password [email or web address] 07/09/2015, 10:39 AM   LOS: 3 days

## 2015-07-10 DIAGNOSIS — T50902D Poisoning by unspecified drugs, medicaments and biological substances, intentional self-harm, subsequent encounter: Secondary | ICD-10-CM

## 2015-07-10 LAB — COMPREHENSIVE METABOLIC PANEL
ALBUMIN: 3.2 g/dL — AB (ref 3.5–5.0)
ALK PHOS: 37 U/L — AB (ref 38–126)
ALT: 10 U/L — AB (ref 14–54)
ANION GAP: 11 (ref 5–15)
AST: 13 U/L — ABNORMAL LOW (ref 15–41)
BILIRUBIN TOTAL: 0.4 mg/dL (ref 0.3–1.2)
BUN: 12 mg/dL (ref 6–20)
CALCIUM: 9.5 mg/dL (ref 8.9–10.3)
CO2: 27 mmol/L (ref 22–32)
CREATININE: 0.7 mg/dL (ref 0.44–1.00)
Chloride: 103 mmol/L (ref 101–111)
GFR calc non Af Amer: >60 mL/min (ref >60)
GLUCOSE: 105 mg/dL — AB (ref 65–99)
Potassium: 3.8 mmol/L (ref 3.5–5.1)
SODIUM: 141 mmol/L (ref 135–145)
TOTAL PROTEIN: 6.4 g/dL — AB (ref 6.5–8.1)

## 2015-07-10 LAB — PROTIME-INR
INR: 1.02 (ref 0.00–1.49)
Prothrombin Time: 13.6 seconds (ref 11.6–15.2)

## 2015-07-10 MED ORDER — POTASSIUM CHLORIDE CRYS ER 20 MEQ PO TBCR
40.0000 meq | EXTENDED_RELEASE_TABLET | Freq: Every day | ORAL | Status: DC
  Administered 2015-07-10 – 2015-07-12 (×3): 40 meq via ORAL
  Filled 2015-07-10 (×2): qty 2

## 2015-07-10 MED ORDER — FUROSEMIDE 40 MG PO TABS
40.0000 mg | ORAL_TABLET | Freq: Every day | ORAL | Status: DC
  Administered 2015-07-10 – 2015-07-12 (×3): 40 mg via ORAL
  Filled 2015-07-10 (×3): qty 1

## 2015-07-10 NOTE — Progress Notes (Signed)
[place] TEAM 1 - Stepdown/ICU TEAM PROGRESS NOTE  Sarah Barnes [id number] DOB: 1950/09/03 DOA: 07/05/2015 PCP: Mackie Pai, PA-C  Admit HPI / Brief Narrative: 65 y.o. female with Hx of HTN, HLD, depression, anxiety, COPD, and EtOH abuse who presented for intentional multi-drug overdose with EtOH. Patient stated this was not her first suicidal attempt. She stated she did this because her sister made her angry. She reported taking "all" her medications. Bottles were brought into the ED and were all empty. Medications included crestor, zocor, paxel, prilosec, prinzide, xanax, fenofibrate,zantac, naproxen, norvasc. Approximate time of ingestion about 8pm. ED called poison control. In ED patient hypotensive. Did not respond to fluid resuscitation or glucagon. Appeared to be having a downward trajectory. Admitted to ICU per PCCM.  HPI/Subjective: Feeling ok, some headaches at times.  Denies abdominal pain  She wants to speak with the psychiatrist about disposition.   Assessment/Plan:  Polysubstance OD Appears to be stabilizing - Oxygen saturation 92 % RA.  Medical stable for inpatient psych admission,   Shock 2/2 antihypertensive overdose (calcium channel blocker) BP has now stabilized w/o need for pressor - follow  BP stable.   Suicide attempt Psych recommended psychiatric inpatient admission when medically cleared - cont suicide precautions - pt is NOT allowed to leave the hospital AMA Psych to follow up on patient. Patient request   Uncompensated met acidosis Appears to have resolved - f/u this afternoon and in AM Resolved.   Hypokalemia Cont to replace and follow - Mg normal  Resolved.   Hypoxemia - Pulm Edema  Possibly due to volume overload + atx + sedation induced hypoventilation + COPD - improving - pt is not on home O2 - wean to RA as able - no evidence of severe CHF on TTE  Resolved.   COPD No evidence of acute flair - follow  H/O HTN Following w/o BP  meds for now    Acute renal injury in setting of multidrug overdose Resolved  Nausea resolved  Code Status: FULL Family Communication: no family present at time of exam Disposition Plan: stable for transfer to tele bed - wean to RA as able - anticipate clearance medically within 24-48hrs   Consultants: PCCM Psychiatry   Procedures: TTE - 2/17 - EF 60-65 percent - no regional wall motion abnormalities - grade 1 diastolic dysfunction  Antibiotics: none  DVT prophylaxis: SQ heparin  Objective: Blood pressure 124/70, pulse 76, temperature 97.6 F (36.4 C), temperature source Oral, resp. rate 18, height _0  (1.6 m), weight 87 kg (191 lb 12.8 oz), SpO2 92 %.  Intake/Output Summary (Last 24 hours) at 07/10/15 0926 Last data filed at 07/10/15 0901  Gross per 24 hour  Intake    356 ml  Output    850 ml  Net   -494 ml   Exam: General: No acute respiratory distress Lungs: Mild bibasilar crackles with no wheezing good air movement throughout other fields Cardiovascular: Regular rate and rhythm without murmur gallop or rub normal S1 and S2 Abdomen: Nontender, nondistended, soft, bowel sounds positive, no rebound, no ascites, no appreciable mass Extremities: No significant cyanosis or clubbing;  Trace edema bilateral lower extremities  Data Reviewed:  Basic Metabolic Panel:  Recent Labs Lab 07/06/15 0210  07/07/15 0525 07/08/15 0542 07/09/15 0228 07/09/15 1424 07/10/15 0215  NA  --   < > 143 143 141 139 141  K  --   < > 3.4* 2.9* 3.3* 3.6 3.8  CL  --   < >  105 99* 99* 99* 103  CO2  --   < > 27 33* _0 GLUCOSE  --   < > 121* 105* 110* 130* 105*  BUN  --   < > 12 <5* _1 CREATININE  --   < > 0.67 0.66 0.83 0.79 0.70  CALCIUM  --   < > 8.8* 9.2 9.0 9.4 9.5  MG 1.7  --   --  1.6* 2.0  --   --   PHOS 4.7*  --   --   --  4.2  --   --   < > = values in this interval not displayed.  CBC:  Recent Labs Lab 07/05/15 2151 07/07/15 0525 07/08/15 0542  07/09/15 0228  WBC 10.0 8.1 8.4 7.9  NEUTROABS 4.2  --   --   --   HGB 14.9 10.9* 11.3* 12.2  HCT 43.1 32.6* 34.5* 36.9  MCV 93.5 97.0 96.1 95.6  PLT 260 158 151 199   Liver Function Tests:  Recent Labs Lab 07/06/15 0210 07/07/15 0525 07/08/15 0542 07/09/15 0228 07/10/15 0215  AST 26 14* 16  --  13*  ALT 13* 11* 10*  --  10*  ALKPHOS 34* 32* 36*  --  37*  BILITOT 0.4 1.0 0.8  --  0.4  PROT 5.4* 5.2* 5.6*  --  6.4*  ALBUMIN 3.2* 3.1* 2.9* 3.0* 3.2*   Coags:  Recent Labs Lab 07/10/15 0215  INR 1.02   No results for input(s): APTT in the last 168 hours.  Cardiac Enzymes:  Recent Labs Lab 07/06/15 0210 07/08/15 1005  CKTOTAL 85  --   TROPONINI  --  0.03    CBG:  Recent Labs Lab 07/06/15 0329  GLUCAP 110*    Recent Results (from the past 240 hour(s))  MRSA PCR Screening     Status: None   Collection Time: 07/06/15  5:49 AM  Result Value Ref Range Status   MRSA by PCR NEGATIVE NEGATIVE Final    Comment:        The GeneXpert MRSA Assay (FDA approved for NASAL specimens only), is one component of a comprehensive MRSA colonization surveillance program. It is not intended to diagnose MRSA infection nor to guide or monitor treatment for MRSA infections.      Studies:   Recent x-ray studies have been reviewed in detail by the Attending Physician  Scheduled Meds:  Scheduled Meds: . furosemide  40 mg Intravenous Q12H  . heparin  5,000 Units Subcutaneous 3 times per day  . pantoprazole  40 mg Oral Daily  . potassium chloride  40 mEq Oral BID    Time spent on care of this patient: 35 mins   Elmarie Shiley , MD  [phone]  [facility] Office  [phone] Pager - Text Page per Amion as per below:  On-Call/Text Page:      [email or web address]      password [email or web address]  If 7PM-7AM, please contact night-coverage [email or web address] Password [email or web address] 07/10/2015, 9:26 AM   LOS: 4 days

## 2015-07-10 NOTE — Progress Notes (Signed)
Disposition CSW completed patient referrals to the following inpatient [facility] facilities:  [place]  CSW will follow patient as needed for placement.  [facility] Disposition CSW [phone]

## 2015-07-10 NOTE — Progress Notes (Signed)
Utilization review completed.  

## 2015-07-11 MED ORDER — ALPRAZOLAM 0.5 MG PO TABS
0.5000 mg | ORAL_TABLET | Freq: Once | ORAL | Status: AC
  Administered 2015-07-11: 0.5 mg via ORAL
  Filled 2015-07-11: qty 1

## 2015-07-11 NOTE — Progress Notes (Signed)
Behavioral Health admissions has been notified of pt's situation and request for psych eval. I was told that the earliest the pt could be seen for a psych evaluation is tomorrow morning. Pt had requested xanax for anxiety, however pt reported that she feels okay now and MD did not order xanax for pt. I will update the MD about the psych eval and continue to monitor the pt.   Grant Fontana RN, BSN

## 2015-07-11 NOTE — Progress Notes (Signed)
Pt requesting xanax for anxiety. Will page MD and continue to monitor.   Grant Fontana RN, BSN
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
## 2015-07-11 NOTE — Care Management Important Message (Signed)
Important Message  Patient Details  Name: Sarah Barnes MRN: [id number] Date of Birth: 04/12/1951   Medicare Important Message Given:  Yes    Loann Quill 07/11/2015, 11:04 AM

## 2015-07-11 NOTE — Progress Notes (Signed)
Spoke with Dr. Tyrell Antonio. MD has requested that I contact behavioral health to discuss pt's request for psych evaluation.

## 2015-07-11 NOTE — Progress Notes (Signed)
[facility] TEAM 1 - Stepdown/ICU TEAM PROGRESS NOTE  Sarah Barnes:[id number] DOB: Sep 30, 1950 DOA: 07/05/2015 PCP: Mackie Pai, PA-C  Admit HPI / Brief Narrative: 65 y.o. female with Hx of HTN, HLD, depression, anxiety, COPD, and EtOH abuse who presented for intentional multi-drug overdose with EtOH. Patient stated this was not her first suicidal attempt. She stated she did this because her sister made her angry. She reported taking "all" her medications. Bottles were brought into the ED and were all empty. Medications included crestor, zocor, paxel, prilosec, prinzide, xanax, fenofibrate,zantac, naproxen, norvasc. Approximate time of ingestion about 8pm. ED called poison control. In ED patient hypotensive. Did not respond to fluid resuscitation or glucagon. Appeared to be having a downward trajectory. Admitted to ICU per PCCM.  HPI/Subjective: Has some anxiety. Wants to go home. Does not want to go to inpatient Psych facility/  Wants to speak with psychiatrist. Nurse will page psych   Assessment/Plan:  Polysubstance OD Appears to be stabilizing - Oxygen saturation 92 % RA.  Medical stable for inpatient psych admission,   Shock 2/2 antihypertensive overdose (calcium channel blocker) BP has now stabilized w/o need for pressor - follow  BP stable.   Suicide attempt Psych recommended psychiatric inpatient admission when medically cleared - cont suicide precautions - pt is NOT allowed to leave the hospital AMA Psych to follow up on patient. Patient request   Uncompensated met acidosis Appears to have resolved - f/u this afternoon and in AM Resolved.   Hypokalemia Cont to replace and follow - Mg normal  Resolved.   Hypoxemia - Pulm Edema  Possibly due to volume overload + atx + sedation induced hypoventilation + COPD - improving - pt is not on home O2 - wean to RA as able - no evidence of severe CHF on TTE  Resolved. Now on oral lasix   COPD No evidence of acute flair  - follow  H/O HTN Following w/o BP meds for now    Acute renal injury in setting of multidrug overdose Resolved  Nausea resolved  Code Status: FULL Family Communication: no family present at time of exam Disposition Plan: stable for transfer to tele bed - wean to RA as able - anticipate clearance medically within 24-48hrs   Consultants: PCCM Psychiatry   Procedures: TTE - 2/17 - EF 60-65 percent - no regional wall motion abnormalities - grade 1 diastolic dysfunction  Antibiotics: none  DVT prophylaxis: SQ heparin  Objective: Blood pressure 123/73, pulse 90, temperature 97.9 F (36.6 C), temperature source Oral, resp. rate 18, height _0  (1.6 m), weight 86.2 kg (190 lb 0.6 oz), SpO2 94 %.  Intake/Output Summary (Last 24 hours) at 07/11/15 1636 Last data filed at 07/11/15 1300  Gross per 24 hour  Intake    840 ml  Output      0 ml  Net    840 ml   Exam: General: No acute respiratory distress Lungs: Mild bibasilar crackles with no wheezing good air movement throughout other fields Cardiovascular: Regular rate and rhythm without murmur gallop or rub normal S1 and S2 Abdomen: Nontender, nondistended, soft, bowel sounds positive, no rebound, no ascites, no appreciable mass Extremities: No significant cyanosis or clubbing;  Trace edema bilateral lower extremities  Data Reviewed:  Basic Metabolic Panel:  Recent Labs Lab 07/06/15 0210  07/07/15 0525 07/08/15 0542 07/09/15 0228 07/09/15 1424 07/10/15 0215  NA  --   < > 143 143 141 139 141  K  --   < >  3.4* 2.9* 3.3* 3.6 3.8  CL  --   < > 105 99* 99* 99* 103  CO2  --   < > 27 33* _0 GLUCOSE  --   < > 121* 105* 110* 130* 105*  BUN  --   < > 12 <5* _1 CREATININE  --   < > 0.67 0.66 0.83 0.79 0.70  CALCIUM  --   < > 8.8* 9.2 9.0 9.4 9.5  MG 1.7  --   --  1.6* 2.0  --   --   PHOS 4.7*  --   --   --  4.2  --   --   < > = values in this interval not displayed.  CBC:  Recent Labs Lab  07/05/15 2151 07/07/15 0525 07/08/15 0542 07/09/15 0228  WBC 10.0 8.1 8.4 7.9  NEUTROABS 4.2  --   --   --   HGB 14.9 10.9* 11.3* 12.2  HCT 43.1 32.6* 34.5* 36.9  MCV 93.5 97.0 96.1 95.6  PLT 260 158 151 199   Liver Function Tests:  Recent Labs Lab 07/06/15 0210 07/07/15 0525 07/08/15 0542 07/09/15 0228 07/10/15 0215  AST 26 14* 16  --  13*  ALT 13* 11* 10*  --  10*  ALKPHOS 34* 32* 36*  --  37*  BILITOT 0.4 1.0 0.8  --  0.4  PROT 5.4* 5.2* 5.6*  --  6.4*  ALBUMIN 3.2* 3.1* 2.9* 3.0* 3.2*   Coags:  Recent Labs Lab 07/10/15 0215  INR 1.02   No results for input(s): APTT in the last 168 hours.  Cardiac Enzymes:  Recent Labs Lab 07/06/15 0210 07/08/15 1005  CKTOTAL 85  --   TROPONINI  --  0.03    CBG:  Recent Labs Lab 07/06/15 0329  GLUCAP 110*    Recent Results (from the past 240 hour(s))  MRSA PCR Screening     Status: None   Collection Time: 07/06/15  5:49 AM  Result Value Ref Range Status   MRSA by PCR NEGATIVE NEGATIVE Final    Comment:        The GeneXpert MRSA Assay (FDA approved for NASAL specimens only), is one component of a comprehensive MRSA colonization surveillance program. It is not intended to diagnose MRSA infection nor to guide or monitor treatment for MRSA infections.      Studies:   Recent x-ray studies have been reviewed in detail by the Attending Physician  Scheduled Meds:  Scheduled Meds: . furosemide  40 mg Oral Daily  . heparin  5,000 Units Subcutaneous 3 times per day  . pantoprazole  40 mg Oral Daily  . potassium chloride  40 mEq Oral Daily    Time spent on care of this patient: 35 mins   Elmarie Shiley , MD  [phone]  [facility] Office  [phone] Pager - Text Page per Amion as per below:  On-Call/Text Page:      [email or web address]      password [email or web address]  If 7PM-7AM, please contact night-coverage [email or web address] Password [email or web address] 07/11/2015, 4:36 PM   LOS: 5 days

## 2015-07-11 NOTE — Clinical Social Work Note (Signed)
Psych CSW continuing to seek inpatient psychiatric hospitalization- geropsych.  Nonnie Done, LCSW [phone]  5N1-9; 2S 15-16 and [place] Licensed Clinical Social Worker

## 2015-07-12 ENCOUNTER — Telehealth: Payer: Self-pay | Admitting: Medical

## 2015-07-12 ENCOUNTER — Inpatient Hospital Stay
Admission: EM | Admit: 2015-07-12 | Discharge: 2015-07-14 | DRG: 885 | Disposition: A | Discharge status: Home / Self Care | Payer: Medicare HMO | Source: Intra-hospital | Attending: Psychiatry | Admitting: Psychiatry

## 2015-07-12 DIAGNOSIS — F17210 Nicotine dependence, cigarettes, uncomplicated: Secondary | ICD-10-CM | POA: Diagnosis present

## 2015-07-12 DIAGNOSIS — J449 Chronic obstructive pulmonary disease, unspecified: Secondary | ICD-10-CM | POA: Diagnosis present

## 2015-07-12 DIAGNOSIS — F1910 Other psychoactive substance abuse, uncomplicated: Secondary | ICD-10-CM

## 2015-07-12 DIAGNOSIS — K219 Gastro-esophageal reflux disease without esophagitis: Secondary | ICD-10-CM | POA: Diagnosis present

## 2015-07-12 DIAGNOSIS — Z62810 Personal history of physical and sexual abuse in childhood: Secondary | ICD-10-CM | POA: Diagnosis present

## 2015-07-12 DIAGNOSIS — Z800 Family history of malignant neoplasm of digestive organs: Secondary | ICD-10-CM | POA: Diagnosis not present

## 2015-07-12 DIAGNOSIS — Z7951 Long term (current) use of inhaled steroids: Secondary | ICD-10-CM

## 2015-07-12 DIAGNOSIS — Z915 Personal history of self-harm: Secondary | ICD-10-CM | POA: Diagnosis not present

## 2015-07-12 DIAGNOSIS — F4001 Agoraphobia with panic disorder: Secondary | ICD-10-CM | POA: Diagnosis present

## 2015-07-12 DIAGNOSIS — Z9889 Other specified postprocedural states: Secondary | ICD-10-CM

## 2015-07-12 DIAGNOSIS — G4700 Insomnia, unspecified: Secondary | ICD-10-CM | POA: Diagnosis present

## 2015-07-12 DIAGNOSIS — F1020 Alcohol dependence, uncomplicated: Secondary | ICD-10-CM | POA: Diagnosis present

## 2015-07-12 DIAGNOSIS — F411 Generalized anxiety disorder: Secondary | ICD-10-CM | POA: Diagnosis present

## 2015-07-12 DIAGNOSIS — I10 Essential (primary) hypertension: Secondary | ICD-10-CM | POA: Diagnosis present

## 2015-07-12 DIAGNOSIS — Z833 Family history of diabetes mellitus: Secondary | ICD-10-CM | POA: Diagnosis not present

## 2015-07-12 DIAGNOSIS — Z79899 Other long term (current) drug therapy: Secondary | ICD-10-CM | POA: Diagnosis not present

## 2015-07-12 DIAGNOSIS — Z9049 Acquired absence of other specified parts of digestive tract: Secondary | ICD-10-CM | POA: Diagnosis not present

## 2015-07-12 DIAGNOSIS — F17200 Nicotine dependence, unspecified, uncomplicated: Secondary | ICD-10-CM | POA: Diagnosis present

## 2015-07-12 DIAGNOSIS — F332 Major depressive disorder, recurrent severe without psychotic features: Principal | ICD-10-CM | POA: Diagnosis present

## 2015-07-12 DIAGNOSIS — E785 Hyperlipidemia, unspecified: Secondary | ICD-10-CM | POA: Diagnosis present

## 2015-07-12 DIAGNOSIS — E781 Pure hyperglyceridemia: Secondary | ICD-10-CM | POA: Diagnosis present

## 2015-07-12 DIAGNOSIS — Z8249 Family history of ischemic heart disease and other diseases of the circulatory system: Secondary | ICD-10-CM | POA: Diagnosis not present

## 2015-07-12 LAB — BASIC METABOLIC PANEL
ANION GAP: 15 (ref 5–15)
BUN: 18 mg/dL (ref 6–20)
CALCIUM: 9.6 mg/dL (ref 8.9–10.3)
CO2: 24 mmol/L (ref 22–32)
Chloride: 104 mmol/L (ref 101–111)
Creatinine, Ser: 0.76 mg/dL (ref 0.44–1.00)
Glucose, Bld: 102 mg/dL — ABNORMAL HIGH (ref 65–99)
Potassium: 4 mmol/L (ref 3.5–5.1)
Sodium: 143 mmol/L (ref 135–145)

## 2015-07-12 MED ORDER — MAGNESIUM HYDROXIDE 400 MG/5ML PO SUSP: 30.0000 mL | Freq: Every day | ORAL | Status: DC | PRN

## 2015-07-12 MED ORDER — ALUM & MAG HYDROXIDE-SIMETH 200-200-20 MG/5ML PO SUSP: 30.0000 mL | ORAL | Status: DC | PRN

## 2015-07-12 MED ORDER — TRAZODONE HCL 100 MG PO TABS
100.0000 mg | ORAL_TABLET | Freq: Every evening | ORAL | Status: DC | PRN
  Administered 2015-07-12 – 2015-07-13 (×2): 100 mg via ORAL
  Filled 2015-07-12 (×2): qty 1

## 2015-07-12 MED ORDER — BENZONATATE 100 MG PO CAPS
100.0000 mg | ORAL_CAPSULE | Freq: Three times a day (TID) | ORAL | Status: DC | PRN
  Filled 2015-07-12 (×2): qty 1

## 2015-07-12 MED ORDER — POTASSIUM CHLORIDE CRYS ER 20 MEQ PO TBCR: 40.0000 meq | EXTENDED_RELEASE_TABLET | Freq: Every day | ORAL | Status: DC

## 2015-07-12 MED ORDER — POTASSIUM CHLORIDE CRYS ER 20 MEQ PO TBCR
40.0000 meq | EXTENDED_RELEASE_TABLET | Freq: Every day | ORAL | Status: DC
  Administered 2015-07-13 – 2015-07-14 (×2): 40 meq via ORAL
  Filled 2015-07-12 (×2): qty 2

## 2015-07-12 MED ORDER — ACETAMINOPHEN 325 MG PO TABS: 650.0000 mg | ORAL_TABLET | Freq: Four times a day (QID) | ORAL | Status: DC | PRN

## 2015-07-12 MED ORDER — FUROSEMIDE 20 MG PO TABS
40.0000 mg | ORAL_TABLET | Freq: Every day | ORAL | Status: DC
  Administered 2015-07-13 – 2015-07-14 (×2): 40 mg via ORAL
  Filled 2015-07-12 (×2): qty 2

## 2015-07-12 MED ORDER — HYDROCHLOROTHIAZIDE 12.5 MG PO CAPS
12.5000 mg | ORAL_CAPSULE | Freq: Every day | ORAL | Status: DC
  Filled 2015-07-12: qty 1

## 2015-07-12 MED ORDER — BENZONATATE 100 MG PO CAPS: 100.0000 mg | ORAL_CAPSULE | Freq: Three times a day (TID) | ORAL | Status: DC | PRN

## 2015-07-12 MED ORDER — ALBUTEROL SULFATE (2.5 MG/3ML) 0.083% IN NEBU: 2.5000 mg | INHALATION_SOLUTION | RESPIRATORY_TRACT | Status: DC | PRN

## 2015-07-12 MED ORDER — LISINOPRIL 5 MG PO TABS
10.0000 mg | ORAL_TABLET | Freq: Every day | ORAL | Status: DC
  Administered 2015-07-12: 10 mg via ORAL
  Filled 2015-07-12 (×2): qty 2

## 2015-07-12 MED ORDER — FENOFIBRATE 54 MG PO TABS
134.0000 mg | ORAL_TABLET | Freq: Every day | ORAL | Status: DC
  Administered 2015-07-12 – 2015-07-14 (×3): 134 mg via ORAL
  Filled 2015-07-12 (×4): qty 1

## 2015-07-12 MED ORDER — PANTOPRAZOLE SODIUM 40 MG PO TBEC: 40.0000 mg | DELAYED_RELEASE_TABLET | Freq: Every day | ORAL | Status: DC

## 2015-07-12 MED ORDER — PANTOPRAZOLE SODIUM 40 MG PO TBEC
40.0000 mg | DELAYED_RELEASE_TABLET | Freq: Every day | ORAL | Status: DC
  Administered 2015-07-13 – 2015-07-14 (×2): 40 mg via ORAL
  Filled 2015-07-12 (×2): qty 1

## 2015-07-12 MED ORDER — FUROSEMIDE 40 MG PO TABS: 40.0000 mg | ORAL_TABLET | Freq: Every day | ORAL | Status: DC

## 2015-07-12 MED ORDER — FLUTICASONE PROPIONATE 50 MCG/ACT NA SUSP
2.0000 | Freq: Every day | NASAL | Status: DC
  Filled 2015-07-12: qty 16

## 2015-07-12 MED ORDER — ROSUVASTATIN CALCIUM 20 MG PO TABS
40.0000 mg | ORAL_TABLET | Freq: Every day | ORAL | Status: DC
  Administered 2015-07-13: 40 mg via ORAL
  Filled 2015-07-12: qty 2

## 2015-07-12 MED ORDER — AMLODIPINE BESYLATE 10 MG PO TABS
10.0000 mg | ORAL_TABLET | Freq: Every day | ORAL | Status: DC
  Filled 2015-07-12 (×2): qty 1
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
## 2015-07-12 NOTE — Discharge Summary (Signed)
Physician Discharge Summary  ELZENA MUSTON AST:[id number] DOB: 1951/01/19 DOA: 07/05/2015  PCP: Mackie Pai, PA-C  Admit date: 07/05/2015 Discharge date: 07/12/2015  Time spent: 35 minutes  Recommendations for Outpatient Follow-up:  1. Needs repeat C-met in 1 week  2. Transfer to inpatient psychiatrist facility    Discharge Diagnoses:    Polysubstance overdose   Depression   Shock ([place])   AKI (acute kidney injury) ([place])   Calcium channel blocker overdose   Encounter for central line placement   Discharge Condition: stable  Diet recommendation: heart healthy   Filed Weights   07/10/15 0542 07/11/15 0510 07/12/15 0604  Weight: 87 kg (191 lb 12.8 oz) 86.2 kg (190 lb 0.6 oz) 85.7 kg (188 lb 15 oz)    History of present illness:  Admit HPI / Brief Narrative: 65 y.o. female with Hx of HTN, HLD, depression, anxiety, COPD, and EtOH abuse who presented for intentional multi-drug overdose with EtOH. Patient stated this was not her first suicidal attempt. She stated she did this because her sister made her angry. She reported taking "all" her medications. Bottles were brought into the ED and were all empty. Medications included crestor, zocor, paxel, prilosec, prinzide, xanax, fenofibrate,zantac, naproxen, norvasc. Approximate time of ingestion about 8pm. ED called poison control. In ED patient hypotensive. Did not respond to fluid resuscitation or glucagon. Appeared to be having a downward trajectory. Admitted to ICU per PCCM.  Hospital Course:   HPI/Subjective: Has some anxiety. Wants to go home. Does not want to go to inpatient Psych facility/  Wants to speak with psychiatrist. Nurse will page psych   Assessment/Plan:  Polysubstance OD Appears to be stabilizing - Oxygen saturation 92 % RA.  Medical stable for inpatient psych admission,   Shock 2/2 antihypertensive overdose (calcium channel blocker) BP has now stabilized w/o need for pressor - follow  BP stable. Hold  norvasc at discharge   Suicide attempt Psych recommended psychiatric inpatient admission when medically cleared - cont suicide precautions - pt is NOT allowed to leave the hospital [place] Transfer today to inpatient psych facility   Uncompensated met acidosis Appears to have resolved - f/u this afternoon and in AM Resolved.   Hypokalemia Cont to replace and follow - Mg normal  Resolved.   Hypoxemia - Pulm Edema  Possibly due to volume overload + atx + sedation induced hypoventilation + COPD - improving - pt is not on home O2 - wean to RA as able - no evidence of severe CHF on TTE  Resolved. Now on oral lasix   COPD No evidence of acute flair - follow  H/O HTN Following w/o BP meds for now   Acute renal injury in setting of multidrug overdose Resolved  Nausea resolved  Procedures: TTE - 2/17 - EF 60-65 percent - no regional wall motion abnormalities - grade 1 diastolic dysfunction  Consultations:  Psych  Discharge Exam: Filed Vitals:   07/12/15 0604 07/12/15 1449  BP: 139/97 135/82  Pulse:  84  Temp: 97.7 F (36.5 C) 97.8 F (36.6 C)  Resp: 18 18    General: NAD Cardiovascular: S 1, S 2 RRR Respiratory: CTA  Discharge Instructions   Discharge Instructions    Diet - low sodium heart healthy    Complete by:  As directed      Increase activity slowly    Complete by:  As directed           Current Discharge Medication List    START taking these  medications   Details  furosemide (LASIX) 40 MG tablet Take 1 tablet (40 mg total) by mouth daily. Qty: 30 tablet, Refills: 0    pantoprazole (PROTONIX) 40 MG tablet Take 1 tablet (40 mg total) by mouth daily. Qty: 30 tablet, Refills: 0    potassium chloride SA (K-DUR,KLOR-CON) 20 MEQ tablet Take 2 tablets (40 mEq total) by mouth daily. Qty: 30 tablet, Refills: 0      CONTINUE these medications which have CHANGED   Details  benzonatate (TESSALON) 100 MG capsule Take 1 capsule (100 mg total) by mouth  3 (three) times daily as needed for cough. Qty: 20 capsule, Refills: 0      CONTINUE these medications which have NOT CHANGED   Details  fenofibrate micronized (LOFIBRA) 134 MG capsule TAKE 1 CAPSULE (134 MG TOTAL) BY MOUTH DAILY BEFORE BREAKFAST. Qty: 30 capsule, Refills: 2    rosuvastatin (CRESTOR) 40 MG tablet Take 1 tablet (40 mg total) by mouth daily. Qty: 30 tablet, Refills: 3    albuterol (PROVENTIL HFA;VENTOLIN HFA) 108 (90 Base) MCG/ACT inhaler Inhale 2 puffs into the lungs every 6 (six) hours as needed for wheezing or shortness of breath. Qty: 1 Inhaler, Refills: 0    fluticasone (FLONASE) 50 MCG/ACT nasal spray Place 2 sprays into both nostrils daily. Qty: 16 g, Refills: 0      STOP taking these medications     ALPRAZolam (XANAX) 0.5 MG tablet      amLODipine (NORVASC) 10 MG tablet      lisinopril-hydrochlorothiazide (PRINZIDE,ZESTORETIC) 10-12.5 MG tablet      naproxen (NAPROSYN) 500 MG tablet      omeprazole (PRILOSEC) 20 MG capsule      PARoxetine (PAXIL) 40 MG tablet      ranitidine (ZANTAC) 150 MG tablet      tiZANidine (ZANAFLEX) 4 MG tablet      azithromycin (ZITHROMAX) 250 MG tablet      neomycin-polymyxin-hydrocortisone (CORTISPORIN) otic solution        No Known Allergies Follow-up Information    Follow up with Saguier, Percell Miller, PA-C In 1 week.   Specialties:  Internal Medicine, Family Medicine   Contact information:   [address] [place] [address] [phone]        The results of significant diagnostics from this hospitalization (including imaging, microbiology, ancillary and laboratory) are listed below for reference.    Significant Diagnostic Studies: Dg Chest Port 1 View  07/08/2015  CLINICAL DATA:  Hypoxia EXAM: PORTABLE CHEST 1 VIEW COMPARISON:  July 07, 2015 FINDINGS: Central catheter tip is in the superior vena cava. No pneumothorax. There is mild bibasilar interstitial edema. Lungs elsewhere clear.  Heart is mildly enlarged with pulmonary vascular within normal limits. No adenopathy. IMPRESSION: Cardiomegaly with bibasilar interstitial edema. These findings are indicative of a degree of congestive heart failure. No airspace consolidation. Central catheter tip in superior vena cava. No pneumothorax. Electronically Signed   By: Lowella Grip III M.D.   On: 07/08/2015 09:34   Dg Chest Port 1 View  07/07/2015  CLINICAL DATA:  Respiratory failure, shock, acute renal injury, polysubstance overdose EXAM: PORTABLE CHEST 1 VIEW COMPARISON:  Portable chest x-ray of July 06, 2015 FINDINGS: The lungs are slightly less well inflated today. The pulmonary interstitial markings have increased. There is blunting of the costophrenic angles today. The cardiac silhouette is top-normal in size. The pulmonary vascularity is engorged. The left internal jugular venous catheter tip projects over the midportion of the SVC.  The observed bony thorax is unremarkable. IMPRESSION: Pulmonary interstitial edema likely of cardiac cause. New small bilateral pleural effusions. Electronically Signed   By: David  Martinique M.D.   On: 07/07/2015 07:30   Dg Chest Port 1 View  07/06/2015  CLINICAL DATA:  Central line placement EXAM: PORTABLE CHEST 1 VIEW COMPARISON:  11/25/2014 FINDINGS: Interval placement of a left jugular venous catheter with tip over the low SVC region. No pneumothorax. Mild cardiac enlargement. No focal airspace disease or consolidation in the lungs. No blunting of costophrenic angles. No pneumothorax. IMPRESSION: Left central venous catheter with tip over the low SVC region. No pneumothorax. Electronically Signed   By: Lucienne Capers M.D.   On: 07/06/2015 02:00    Microbiology: Recent Results (from the past 240 hour(s))  MRSA PCR Screening     Status: None   Collection Time: 07/06/15  5:49 AM  Result Value Ref Range Status   MRSA by PCR NEGATIVE NEGATIVE Final    Comment:        The GeneXpert MRSA Assay  (FDA approved for NASAL specimens only), is one component of a comprehensive MRSA colonization surveillance program. It is not intended to diagnose MRSA infection nor to guide or monitor treatment for MRSA infections.      Labs: Basic Metabolic Panel:  Recent Labs Lab 07/06/15 0210  07/08/15 0542 07/09/15 0228 07/09/15 1424 07/10/15 0215 07/12/15 0217  NA  --   < > 143 141 139 141 143  K  --   < > 2.9* 3.3* 3.6 3.8 4.0  CL  --   < > 99* 99* 99* 103 104  CO2  --   < > 33* '31 28 27 24  '$ GLUCOSE  --   < > 105* 110* 130* 105* 102*  BUN  --   < > <5* '9 9 12 18  '$ CREATININE  --   < > 0.66 0.83 0.79 0.70 0.76  CALCIUM  --   < > 9.2 9.0 9.4 9.5 9.6  MG 1.7  --  1.6* 2.0  --   --   --   PHOS 4.7*  --   --  4.2  --   --   --   < > = values in this interval not displayed. Liver Function Tests:  Recent Labs Lab 07/06/15 0210 07/07/15 0525 07/08/15 0542 07/09/15 0228 07/10/15 0215  AST 26 14* 16  --  13*  ALT 13* 11* 10*  --  10*  ALKPHOS 34* 32* 36*  --  37*  BILITOT 0.4 1.0 0.8  --  0.4  PROT 5.4* 5.2* 5.6*  --  6.4*  ALBUMIN 3.2* 3.1* 2.9* 3.0* 3.2*   No results for input(s): LIPASE, AMYLASE in the last 168 hours. No results for input(s): AMMONIA in the last 168 hours. CBC:  Recent Labs Lab 07/05/15 2151 07/07/15 0525 07/08/15 0542 07/09/15 0228  WBC 10.0 8.1 8.4 7.9  NEUTROABS 4.2  --   --   --   HGB 14.9 10.9* 11.3* 12.2  HCT 43.1 32.6* 34.5* 36.9  MCV 93.5 97.0 96.1 95.6  PLT 260 158 151 199   Cardiac Enzymes:  Recent Labs Lab 07/06/15 0210 07/08/15 1005  CKTOTAL 85  --   TROPONINI  --  0.03   BNP: BNP (last 3 results) No results for input(s): BNP in the last 8760 hours.  ProBNP (last 3 results) No results for input(s): PROBNP in the last 8760 hours.  CBG:  Recent Labs Lab 07/06/15  [place]       Signed:  Niel Hummer A MD.  [facility] 07/12/2015, 3:06 PM

## 2015-07-12 NOTE — Care Management Note (Addendum)
Case Management Note Previous CM note initiated by Elenor Quinones RN, CM  Patient Details  Name: Sarah Barnes MRN: [id number] Date of Birth: 1951/03/15  Subjective/Objective:    Pt admitted for polysubstance overdose                Action/Plan:  Pt is from home with sister and sister's boyfriend.  Pt has recently been Involuntarily committed, CSW following.     Expected Discharge Date:    07/12/15         Expected Discharge Plan:  Psychiatric Hospital  In-House Referral:  Clinical Social Work  Discharge planning Services  CM Consult  Post Acute Care Choice:    Choice offered to:     DME Arranged:    DME Agency:     HH Arranged:    [place] Agency:     Status of Service:  Completed, signed off  Medicare Important Message Given:  Yes Date Medicare IM Given:    Medicare IM give by:    Date Additional Medicare IM Given:    Additional Medicare Important Message give by:     If discussed at [place] of Stay Meetings, dates discussed:    Discharge Disposition: [facility]   Additional Comments:  07/11/15- 1330- Marvetta Gibbons RN, BSN - spoke with [place]- still awaiting psych inpt bed- CSW following - per Dr. Tyrell Antonio pt medically stable when bed available.   Dawayne Patricia, RN 07/12/2015, 3:45 PM

## 2015-07-12 NOTE — BH Assessment (Signed)
Patient has been accepted to [facility].  Accepting physician is Dr. Jerilee Hoh.  Attending Physician will be Dr. Bary Leriche.  Patient has been assigned to room 312, by [place].  Call report to [phone].  Representative/Transfer Coordinator is Rufina Falco [facility] Barnett Applebaum, Social Worker) made aware of acceptance.

## 2015-07-12 NOTE — Progress Notes (Signed)
D:  Patient is a 65 year-old female admitted to [facility] ambulatory without difficulty.  Patient is alert and oriented upon admission.  Patient is tearful on admission interview and states "I just want to get out of here."  Patient states that she does not want to die and did not mean to attempt suicide.  Patient states "that psychiatrist was being a dick.  He put me here because I didn't want to talk to him.  I tried to talk to him today but it was too late."   A:  Admission assessment completed without difficulty.  Skin and contraband assessment completed with no skin abnormalities nor contraband found.  Q.15 minute safety checks were implemented at the time of admission.  Patient was oriented to the unit and escorted to room #312. R:  Patient was receptive to and cooperative with admission assessment.  Patient contracts for safety on the unit at this time

## 2015-07-12 NOTE — Progress Notes (Signed)
On-call physician made aware of patient's presenting BP on admission of 161/97.

## 2015-07-12 NOTE — Progress Notes (Signed)
Pt escort arrived.  Pt discharged off unit in care of [facility].

## 2015-07-12 NOTE — Progress Notes (Signed)
Pt told that the doctors have all signed off on her going to an inpatient psychiatric center for further treatment.  She became very agitated, started cursing and threatening to "sue".  Crying and very upset.  Emotional support provided, but pt aware that ultimately, the plan is for her to transfer to the safest environment the medical team deems fitting.  Tele and IV removed, pt provided with paper scrubs to change into.  Sitter continues to be at bedside for safety.

## 2015-07-12 NOTE — Progress Notes (Signed)
Pt expressing strong desire to be discharged to home.  She states that she regrets the events that happened prior to her admission.  I was told upon report that pt was supposed to be evaluated by psych yesterday but was told that it would have to be delayed until today. Pt has not seen anyone yet from psych. Daine Gravel with CSW and left VM asking who I need to contact for this.

## 2015-07-12 NOTE — Telephone Encounter (Signed)
Pt needs a 30-45 minute appointment when she does follow up. I would prefer not on Thursday or Wednesday. She may show up late. Hospital follow up and expect will need a lot of time after reviewing why she was admitted. So may take some time on interview.  She may call sometime soon. Can note be placed on chart

## 2015-07-12 NOTE — Tx Team (Signed)
Initial Interdisciplinary Treatment Plan   PATIENT STRESSORS: Health problems Marital or family conflict   PATIENT STRENGTHS: Average or above average intelligence Communication skills General fund of knowledge   PROBLEM LIST: Problem List/Patient Goals Date to be addressed Date deferred Reason deferred Estimated date of resolution  "getting out" 07/12/15     Suicide attempt 07/12/15                                                DISCHARGE CRITERIA:  Improved stabilization in mood, thinking, and/or behavior Safe-care adequate arrangements made Verbal commitment to aftercare and medication compliance  PRELIMINARY DISCHARGE PLAN: Outpatient therapy  PATIENT/FAMIILY INVOLVEMENT: This treatment plan has been presented to and reviewed with the patient, Sarah Barnes.  The patient and family have been given the opportunity to ask questions and make suggestions.  Dyann Kief 07/12/2015, 5:56 PM

## 2015-07-12 NOTE — Clinical Social Work Psych Note (Addendum)
Psych CSW was contacted by RN to assist with disposition.  Patient has been accepted to [facility] (bed assignment pending).  [facility] states bed will be ready during next shift.  Psych CSW requested they follow up with unit [phone].   RN to call report to: [place] transportation has been called (2:48pm)  IVC paperwork on front chart  Nonnie Done, [place] [phone]  [id number]; 2S 15-16 and [place] Licensed Clinical Social Worker

## 2015-07-12 NOTE — Telephone Encounter (Signed)
Forwarding to [employer]

## 2015-07-13 DIAGNOSIS — F332 Major depressive disorder, recurrent severe without psychotic features: Principal | ICD-10-CM

## 2015-07-13 DIAGNOSIS — F4001 Agoraphobia with panic disorder: Secondary | ICD-10-CM | POA: Diagnosis present

## 2015-07-13 MED ORDER — VENLAFAXINE HCL ER 75 MG PO CP24
150.0000 mg | ORAL_CAPSULE | Freq: Every day | ORAL | Status: DC
  Administered 2015-07-13 – 2015-07-14 (×2): 150 mg via ORAL
  Filled 2015-07-13 (×2): qty 2

## 2015-07-13 MED ORDER — PRAZOSIN HCL 2 MG PO CAPS: 2.0000 mg | ORAL_CAPSULE | Freq: Every morning | ORAL | Status: DC

## 2015-07-13 MED ORDER — ALPRAZOLAM 0.5 MG PO TABS
0.5000 mg | ORAL_TABLET | Freq: Three times a day (TID) | ORAL | Status: DC
  Administered 2015-07-13 – 2015-07-14 (×4): 0.5 mg via ORAL
  Filled 2015-07-13 (×4): qty 1

## 2015-07-13 MED ORDER — VENLAFAXINE HCL ER 150 MG PO CP24: 150.0000 mg | ORAL_CAPSULE | Freq: Every day | ORAL | Status: DC

## 2015-07-13 MED ORDER — PRAZOSIN HCL 1 MG PO CAPS
2.0000 mg | ORAL_CAPSULE | Freq: Every morning | ORAL | Status: DC
  Administered 2015-07-13 – 2015-07-14 (×2): 2 mg via ORAL
  Filled 2015-07-13 (×2): qty 2

## 2015-07-13 MED ORDER — TRAZODONE HCL 100 MG PO TABS: 100.0000 mg | ORAL_TABLET | Freq: Every day | ORAL | Status: DC
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
## 2015-07-13 NOTE — BHH Suicide Risk Assessment (Addendum)
[facility] Discharge Suicide Risk Assessment   Principal Problem: Major depressive disorder, recurrent severe without psychotic features [facility]) Discharge Diagnoses:  Patient Active Problem List   Diagnosis Date Noted  . Panic disorder with agoraphobia [F40.01] 07/13/2015  . Major depressive disorder, recurrent severe without psychotic features ([place]) [F33.2] 07/12/2015  . Tobacco use disorder [F17.200] 07/12/2015  . Alcohol use disorder, severe, dependence ([place]) [F10.20] 07/12/2015  . Knee pain, bilateral [M25.561, M25.562] 09/17/2014  . Generalized anxiety disorder [F41.1] 05/17/2014  . Hyperlipidemia [E78.5] 05/05/2014  . GERD (gastroesophageal reflux disease) [K21.9] 05/05/2014  . Essential hypertension [I10] 05/05/2014    Total Time spent with patient: 30 minutes  Musculoskeletal: Strength & Muscle Tone: within normal limits Gait & Station: normal Patient leans: N/A  Psychiatric Specialty Exam: Review of Systems  All other systems reviewed and are negative.   Blood pressure 143/80, pulse 83, temperature 98 F (36.7 C), temperature source Oral, resp. rate 20, height 5\' 3"  (1.6 m), weight 86.183 kg (190 lb), SpO2 91 %.Body mass index is 33.67 kg/(m^2).  General Appearance: Casual  Eye Contact::  Good  Speech:  Clear and [id number]  Volume:  Normal  Mood:  Euthymic  Affect:  Appropriate  Thought Process:  Goal Directed  Orientation:  Full (Time, Place, and Person)  Thought Content:  WDL  Suicidal Thoughts:  No  Homicidal Thoughts:  No  Memory:  Immediate;   Fair Recent;   Fair Remote;   Fair  Judgement:  Impaired  Insight:  Present  Psychomotor Activity:  Normal  Concentration:  Fair  Recall:  [employer] of Knowledge:Fair  Language: Fair  Akathisia:  No  Handed:  Right  AIMS (if indicated):     Assets:  Communication Skills Desire for Improvement Financial Resources/Insurance Housing Physical Health Resilience Social Support  Sleep:  Number of Hours: 7.5   Cognition: WNL  ADL's:  Intact   Mental Status Per Nursing Assessment::   On Admission:     Demographic Factors:  Age 65 or older, Caucasian and Gay, lesbian, or bisexual orientation  Loss Factors: Financial problems/change in socioeconomic status  Historical Factors: Family history of mental illness or substance abuse and Impulsivity  Risk Reduction Factors:   Sense of responsibility to family, Living with another person, especially a relative, Positive social support and Positive therapeutic relationship  Continued Clinical Symptoms:  Depression:   Impulsivity  Cognitive Features That Contribute To Risk:  None    Suicide Risk:  Minimal: No identifiable suicidal ideation.  Patients presenting with no risk factors but with morbid ruminations; may be classified as minimal risk based on the severity of the depressive symptoms    Plan Of Care/Follow-up recommendations:  Activity:  As tolerated. Diet:  Low sodium heart healthy. Other:  Keep follow-up appointments.  Orson Slick, MD 07/13/2015, 3:58 PM

## 2015-07-13 NOTE — BHH Group Notes (Signed)
[facility] LCSW Aftercare Discharge Planning Group Note   07/13/2015 12:04 PM  Participation Quality:  Patient was called to group but did not attend.     Keene Breath, MSW, LCSWA

## 2015-07-13 NOTE — Progress Notes (Signed)
Recreation Therapy Notes  Date: 02.22.17 Time: 3:00 pm Location: Community Room   Group Topic: Self-esteem, Coping skills  Goal Area(s) Addresses:  Patient will identify positive traits about self. Patient will identify at least one coping skill.  Behavioral Response: Did not attend  Intervention: All About Me  Activity: Patients were instructed to make an All About Me pamphlet including their life's motto, positive traits about themselves, healthy coping skills, and their support system.  Education: LRT educated patients on ways they can increase their self-esteem.  Education Outcome: Patient did not attend group.  Clinical Observations/Feedback: Patient did not attend group.  Leonette Monarch, LRT/CTRS 07/13/2015 4:18 PM

## 2015-07-13 NOTE — Plan of Care (Signed)
Problem: Ineffective individual coping Goal: LTG: Patient will report a decrease in negative feelings Outcome: Progressing Pt denies SI at this time. Goal: STG: Patient will remain free from self harm Outcome: Progressing Pt remains free from harm

## 2015-07-13 NOTE — BHH Suicide Risk Assessment (Signed)
[facility] Admission Suicide Risk Assessment   Nursing information obtained from:    Demographic factors:    Current Mental Status:    Loss Factors:    Historical Factors:    Risk Reduction Factors:     Total Time spent with patient: 1 hour Principal Problem: Major depressive disorder, recurrent severe without psychotic features ([place]) Diagnosis:   Patient Active Problem List   Diagnosis Date Noted  . Panic disorder with agoraphobia [F40.01] 07/13/2015  . Major depressive disorder, recurrent severe without psychotic features ([place]) [F33.2] 07/12/2015  . Tobacco use disorder [F17.200] 07/12/2015  . Alcohol use disorder, severe, dependence ([place]) [F10.20] 07/12/2015  . Knee pain, bilateral [M25.561, M25.562] 09/17/2014  . Generalized anxiety disorder [F41.1] 05/17/2014  . Hyperlipidemia [E78.5] 05/05/2014  . GERD (gastroesophageal reflux disease) [K21.9] 05/05/2014  . Essential hypertension [I10] 05/05/2014   Subjective Data: Depression and anxiety.  Continued Clinical Symptoms:  Alcohol Use Disorder Identification Test Final Score (AUDIT): 6 The "Alcohol Use Disorders Identification Test", Guidelines for Use in Primary Care, Second Edition.  World Pharmacologist [facility]). Score between 0-7:  no or low risk or alcohol related problems. Score between 8-15:  moderate risk of alcohol related problems. Score between 16-19:  high risk of alcohol related problems. Score 20 or above:  warrants further diagnostic evaluation for alcohol dependence and treatment.   CLINICAL FACTORS:   Severe Anxiety and/or Agitation Depression:   Comorbid alcohol abuse/dependence Severe Alcohol/Substance Abuse/Dependencies Previous Psychiatric Diagnoses and Treatments   Musculoskeletal: Strength & Muscle Tone: within normal limits Gait & Station: normal Patient leans: N/A  Psychiatric Specialty Exam: Review of Systems  Musculoskeletal: Positive for joint pain.  Psychiatric/Behavioral: Positive for  depression. The patient is nervous/anxious.   All other systems reviewed and are negative.   Blood pressure 143/80, pulse 83, temperature 98 F (36.7 C), temperature source Oral, resp. rate 20, height 5\' 3"  (1.6 m), weight 86.183 kg (190 lb), SpO2 91 %.Body mass index is 33.67 kg/(m^2).  General Appearance: Casual  Eye Contact::  Good  Speech:  Clear and Coherent  Volume:  Normal  Mood:  Anxious and Depressed  Affect:  Tearful  Thought Process:  Goal Directed  Orientation:  Full (Time, Place, and Person)  Thought Content:  WDL  Suicidal Thoughts:  No  Homicidal Thoughts:  No  Memory:  Immediate;   Fair Recent;   Fair Remote;   Fair  Judgement:  Fair  Insight:  Fair  Psychomotor Activity:  Normal  Concentration:  Fair  Recall:  [employer] of [place]  Language: Fair  Akathisia:  No  Handed:  Right  AIMS (if indicated):     Assets:  Communication Skills Desire for Improvement Financial Resources/Insurance Housing Physical Health Resilience Social Support  Sleep:  Number of Hours: 7.5  Cognition: WNL  ADL's:  Intact    COGNITIVE FEATURES THAT CONTRIBUTE TO RISK:  None    SUICIDE RISK:   Minimal: No identifiable suicidal ideation.  Patients presenting with no risk factors but with morbid ruminations; may be classified as minimal risk based on the severity of the depressive symptoms  PLAN OF CARE: Hospital admission, medication management, substance abuse counseling, discharge planning.  Mrs. Feeley is a 65 year old female with a history of depression and anxiety transferred from medical floor where she was hospitalized for multiple complications following suicide attempt by overdose.  1. Suicidal ideation. The patient adamantly denies any thoughts, intention or plans to hurt herself or anybody else. She is able to contract  for safety.   2. Mood and anxiety. She has been maintained on a combination of Paxil and Xanax for many years. Both were discontinued on  the medical floor. We will start Effexor XR 150 mg for depression and Xanax 0.5 mg 3 times daily for anxiety. We will also Abilify for augmentation.  3. Hypertension. The patient was on multiple antihypertensives. This was discontinued on the medical floor. She was started on furosemide and potassium.  4. Dyslipidemia. We'll continue Fenofibrate and Crestor.  5. Insomnia. She responded well to trazodone.  6. Smoking. Nicotine patches available.  7. Alcohol use. The patient denies heavy alcohol use. Some days she may have 2 beers. She was drinking when she overdosed on February 14. There are no symptoms of withdrawal.  8. GERD. She is on Protonix.  9.  COPD. We'll continue inhalers.  10. Disposition. She will be discharged to home with family. She will follow up at [facility]. We recommend grief counseling with hospice.  I certify that inpatient services furnished can reasonably be expected to improve the patient's condition.   Orson Slick, MD 07/13/2015, 10:21 AM
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
## 2015-07-13 NOTE — Progress Notes (Signed)
D: Pt affect is anxious and irritable this evening. Pt has poor insight into her condition and states that she "doesn't need to be here." Denies SI/HI/AVH at this time. Denies pain. Pt reports that she has a hiatal hernia and cannot sleep laying down. HOB raised. A: Emotional support and encouragement provided. Medications administered with education. Pt given ordered antihypertensive due to elevated blood pressure on admission and upon shift assessment. q15 minute safety checks maintained.  R: Pt remains free from harm. Will continue to monitor.

## 2015-07-13 NOTE — Plan of Care (Signed)
Problem: Ineffective individual coping Goal: LTG: Patient will report a decrease in negative feelings Outcome: Progressing Denies suicidal ideations

## 2015-07-13 NOTE — BHH Group Notes (Signed)
[place] LCSW Group Therapy  07/13/2015 2:40 PM  Type of Therapy:  Group Therapy  Participation Level:  Did Not Attend  Summary of Progress/Problems: Patient was called to group but did not attend.    Keene Breath, MSW, LCSWA 07/13/2015, 2:40 PM

## 2015-07-13 NOTE — BHH Group Notes (Signed)
[place] Group Notes:  (Nursing/MHT/Case Management/Adjunct)  Date:  07/13/2015  Time:  1:44 AM  Type of Therapy:  Group Therapy  Participation Level:  Did Not Attend   Summary of Progress/Problems:  Sarah Barnes 07/13/2015, 1:44 AM

## 2015-07-13 NOTE — Progress Notes (Signed)
D: Patient voice of being upset from last night . Stated she was humiliated by staff . Writer apologized to patient for her  Inconvenience.Patient stated slept fair last night .Stated appetite is fair and energy level  Islow. Stated concentration is good . Stated on Depression scale 5 hopeless 0 and anxiety 6 .( low 0-10 high) Denies suicidal  homicidal ideations  .  No auditory hallucinations   . Appropriate ADL'S. Interacting with peers and staff.  A: Encourage patient participation with unit programming . Instruction  Given on  Medication , verbalize understanding. R: Voice no other concerns. Staff continue to monitor

## 2015-07-13 NOTE — BHH Group Notes (Signed)
[place] Group Notes:  (Nursing/MHT/Case Management/Adjunct)  Date:  07/13/2015  Time:  10:04 PM  Type of Therapy:  Wrap-up Group  Participation Level:  Minimal  Participation Quality:  Attentive  Affect:  Appropriate  Cognitive:  Alert  Insight:  Good  Engagement in Group:  Developing/Improving  Modes of Intervention:  Discussion  Summary of Progress/Problems: Patient stated that she was just ready to discharge.  Joyce Leckey Nanta Robbi Scurlock 07/13/2015, 10:04 PM
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
## 2015-07-13 NOTE — BHH Group Notes (Signed)
[place] Group Notes:  (Nursing/MHT/Case Management/Adjunct)  Date:  07/13/2015  Time:  2:21 PM  Type of Therapy:  Psychoeducational Skills  Participation Level:  Did Not Attend   Lorane Gell 07/13/2015, 2:21 PM

## 2015-07-13 NOTE — BHH Suicide Risk Assessment (Signed)
[place] INPATIENT:  Family/Significant Other Suicide Prevention Education  Suicide Prevention Education:  Patient Refusal for Family/Significant Other Suicide Prevention Education: The patient Sarah Barnes has refused to provide written consent for family/significant other to be provided Family/Significant Other Suicide Prevention Education during admission and/or prior to discharge.  Physician notified.  Pt refused SPE from the CSW.  Sarah Barnes 07/13/2015, 3:51 PM

## 2015-07-14 MED ORDER — OMEPRAZOLE 20 MG PO CPDR: 20.0000 mg | DELAYED_RELEASE_CAPSULE | Freq: Every day | ORAL | Status: DC
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
## 2015-07-14 NOTE — Progress Notes (Signed)
A&Ox3, mood and affect bright and appropriate, denied pain, interacting visibly with peers, anticipating discharge, requested for Trazodone 100 mg PRN with bedtime meds.

## 2015-07-14 NOTE — Plan of Care (Signed)
Problem: Ineffective individual coping Goal: STG: Patient will remain free from self harm Outcome: Progressing 15 minute checks maintained for safety, clinical and moral support provided, patient encouraged to continue to express feelings and demonstrate safe care. Patient remain free from harm, will continue to monitor.

## 2015-07-14 NOTE — Progress Notes (Signed)
  [facility] Adult Case Management Discharge Plan :  Will you be returning to the same living situation after discharge:  Yes,  pt will return home to [facility] to live with her sister At discharge, do you have transportation home?: Yes,  pt will be transported by [employer] Do you have the ability to pay for your medications: Yes,  pt will be provided with prescriptions at discharge  Release of information consent forms completed and in the chart;  Patient's signature needed at discharge.  Patient to Follow up at: Follow-up Information    Follow up with [facility] ).   Why:  Please arrive to the walk-in clinic between the hours of 8am-5pm Monday thru Friday for your assessment  for medication managment and therapy   Contact information:   [address] [place], [place] [address] Phone: [phone] Fax: [phone]       Next level of care provider has access to [place] and Suicide Prevention discussed: Yes,  completed with pt  Have you used any form of tobacco in the last 30 days? (Cigarettes, Smokeless Tobacco, Cigars, and/or Pipes): Yes  Has patient been referred to the Quitline?: Patient refused referral  Patient has been referred for addiction treatment: Yes  Alphonse Guild Lebaron Bautch 07/14/2015, 11:54 AM

## 2015-07-14 NOTE — Progress Notes (Addendum)
Patient ID: Sarah Barnes, female   DOB: [birth year], 65 y.o.   MRN: [id number]   CSW entered the pt's follow up for medication management and therapy at [facility] [place], [place] [facility] location) after pt was discharged due to the pt being picked up by [facility] Department earlier than expected.  CSW entered the pt's follow up and then contacted the pt at her home at ph: [phone] to ensure she was aware of their follow up.  CSW also gave the pt the follow upo information the day before her discharge and informed her of price, co-pay, location, walk-in clinic hours, etc.

## 2015-07-14 NOTE — Progress Notes (Signed)
D:Patient aware of discharge this shift . Patient returning home . Patient received all belonging locked up . Patient denies  Suicidal  And homicidal ideations  .  A: Writer instructed on discharge criteria  . Received  prescriptions . R: Patient left unit with no questions  Or concerns  With [employer]

## 2015-07-15 MED FILL — ROSUVASTATIN CALCIUM 40 MG: 40 | 30 days supply | Qty: 30 | Fill #1

## 2015-07-15 MED FILL — PARoxetine HCL 40 MG TABS: 40 | 30 days supply | Qty: 30 | Fill #1

## 2015-07-18 ENCOUNTER — Other Ambulatory Visit: Payer: Self-pay

## 2015-07-18 NOTE — Telephone Encounter (Signed)
Please advise on refill. Pt was last seen 05/26/2015.

## 2015-07-19 ENCOUNTER — Other Ambulatory Visit: Payer: Self-pay

## 2015-07-19 MED ORDER — POTASSIUM CHLORIDE CRYS ER 20 MEQ PO TBCR: 40.0000 meq | EXTENDED_RELEASE_TABLET | Freq: Every day | ORAL | Status: DC

## 2015-07-19 MED ORDER — FUROSEMIDE 40 MG PO TABS: 40.0000 mg | ORAL_TABLET | Freq: Every day | ORAL | Status: DC

## 2015-07-19 MED ORDER — VENLAFAXINE HCL ER 150 MG PO CP24: 150.0000 mg | ORAL_CAPSULE | Freq: Every day | ORAL | Status: DC

## 2015-07-19 NOTE — Telephone Encounter (Signed)
Please have pt come in for an appointment next week for a 45 minute appointment.

## 2015-07-19 NOTE — Telephone Encounter (Signed)
Scheduled for [facility] 08/01/15 10:30am 45 min

## 2015-07-19 NOTE — Telephone Encounter (Signed)
Pt was in hospital for suicide attempt. If now out needs follow up. But 45 minute appointment. Not on Wednesday or Thursday. Other day.

## 2015-07-20 ENCOUNTER — Other Ambulatory Visit: Payer: Self-pay

## 2015-07-21 ENCOUNTER — Other Ambulatory Visit: Payer: Self-pay

## 2015-07-21 MED ORDER — POTASSIUM CHLORIDE CRYS ER 20 MEQ PO TBCR: 40.0000 meq | EXTENDED_RELEASE_TABLET | Freq: Every day | ORAL | Status: DC

## 2015-08-01 ENCOUNTER — Ambulatory Visit (INDEPENDENT_AMBULATORY_CARE_PROVIDER_SITE_OTHER): Payer: Medicare HMO | Admitting: Medical

## 2015-08-01 ENCOUNTER — Telehealth: Payer: Self-pay

## 2015-08-01 ENCOUNTER — Encounter: Payer: Self-pay | Admitting: Medical

## 2015-08-01 VITALS — BP 128/90 | HR 111 | Temp 97.7°F | Ht 63.0 in | Wt 191.6 lb

## 2015-08-01 DIAGNOSIS — J0100 Acute maxillary sinusitis, unspecified: Secondary | ICD-10-CM | POA: Diagnosis not present

## 2015-08-01 DIAGNOSIS — F329 Major depressive disorder, single episode, unspecified: Secondary | ICD-10-CM | POA: Diagnosis not present

## 2015-08-01 DIAGNOSIS — J209 Acute bronchitis, unspecified: Secondary | ICD-10-CM

## 2015-08-01 DIAGNOSIS — I10 Essential (primary) hypertension: Secondary | ICD-10-CM

## 2015-08-01 DIAGNOSIS — F4322 Adjustment disorder with anxiety: Secondary | ICD-10-CM

## 2015-08-01 DIAGNOSIS — M609 Myositis, unspecified: Secondary | ICD-10-CM

## 2015-08-01 DIAGNOSIS — Z9189 Other specified personal risk factors, not elsewhere classified: Secondary | ICD-10-CM

## 2015-08-01 DIAGNOSIS — Z915 Personal history of self-harm: Secondary | ICD-10-CM

## 2015-08-01 DIAGNOSIS — M791 Myalgia: Secondary | ICD-10-CM

## 2015-08-01 DIAGNOSIS — Z9151 Personal history of suicidal behavior: Secondary | ICD-10-CM

## 2015-08-01 DIAGNOSIS — IMO0001 Reserved for inherently not codable concepts without codable children: Secondary | ICD-10-CM

## 2015-08-01 DIAGNOSIS — F32A Depression, unspecified: Secondary | ICD-10-CM

## 2015-08-01 LAB — POCT INFLUENZA A/B
INFLUENZA A, POC: NEGATIVE
Influenza B, POC: NEGATIVE

## 2015-08-01 MED ORDER — ALPRAZOLAM 0.5 MG PO TABS: ORAL_TABLET | ORAL | Status: DC

## 2015-08-01 MED ORDER — FLUTICASONE PROPIONATE 50 MCG/ACT NA SUSP: 2.0000 | Freq: Every day | NASAL | Status: DC

## 2015-08-01 MED ORDER — AZITHROMYCIN 250 MG PO TABS: ORAL_TABLET | ORAL | Status: DC

## 2015-08-01 MED FILL — ALPRAZolam 0.5 MG TABS: 0.5 | 7 days supply | Qty: 14 | Fill #0

## 2015-08-01 MED FILL — FLUTICASONE PROP 50 MCG SPR: 50 | 30 days supply | Qty: 16 | Fill #0

## 2015-08-01 MED FILL — AZITHROMYCIN 250 MG TABLET: 250 | 5 days supply | Qty: 6 | Fill #0

## 2015-08-01 NOTE — Telephone Encounter (Signed)
Left message on pt's home number to call back and set up a lab appointment for blood work.

## 2015-08-01 NOTE — Progress Notes (Signed)
Pre visit review using our clinic review tool, if applicable. No additional management support is needed unless otherwise documented below in the visit note. 

## 2015-08-01 NOTE — Patient Instructions (Addendum)
For your depression, anxiety and history of suicide attempt, I want you to continue effexor. I do want you to see [facility] and will put in referral. If your mood or anxiety worsens/suicidal thoughts or thoughts harming others then ED evaluation. I am going to give you very limited number of xanax. For further larger prescription you would need to see psychiatrist.   For bronchitis and sinusitis take azithromycin. For nasal congestion flonase. For cough take benzonatate.  Flu test was negative. In your case I do believe test result is correct.  Follow up in 2 weeks or as needed

## 2015-08-01 NOTE — Progress Notes (Signed)
Subjective:    Patient ID: Sarah Barnes, female    DOB: 1951-03-22, 65 y.o.   MRN: [id number]  HPI  65 y.o. female with Hx of HTN, HLD, depression, anxiety, COPD, and EtOH abuse who presented for intentional multi-drug overdose with EtOH. Patient stated this was not her first suicidal attempt. She stated she did this because her sister made her angry. She reported taking "all" her medications. Bottles were brought into the ED and were all empty. Medications included crestor, zocor, paxel, prilosec, prinzide, xanax, fenofibrate,zantac, naproxen, norvasc. Approximate time of ingestion about 8pm. ED called poison control. In ED patient hypotensive. Did not respond to fluid resuscitation or glucagon. Appeared to be having a downward trajectory. Admitted to ICU per PCCM.  Psych recommended psychiatric inpatient admission when medically cleared - cont suicide precautions   Pt was admitted to psychiatiry facility. She was recently discharged. Above is from reviwed hospital note and from behavioral health.  Pt was placed on effexor xr 150 mg a day and xanax .5 mg tid in hospital(But she was only discharged on effexor). Pt was denying any suicidal ideation on discharge. She still denies any suicidal ideation and for any homicidal ideations. Behavioral health did want her to see psychiatrist but she states she does not believe in psychiatrist.   Pt htn is controlled. She is now only on lasix only.   Insomnia. She is taking trazodone.  Jerrye Bushy. She is still on protonix.  Pt also states 2 weeks of cough, nasal congestion and runny nose. She states sinus hurts, some teeth pain, sneezing and productive cough. She states each year this time of year will get bronchitis. Pt is a smoker.   Pt has some body aches 2 days ago.   Review of Systems  Constitutional: Negative for fever, chills and fatigue.  HENT: Positive for sinus pressure. Negative for congestion, ear discharge, ear pain, nosebleeds,  postnasal drip, sore throat and tinnitus.   Respiratory: Positive for cough. Negative for shortness of breath and wheezing.   Cardiovascular: Negative for chest pain and palpitations.  Gastrointestinal: Negative for nausea, abdominal pain and diarrhea.  Musculoskeletal: Positive for myalgias. Negative for back pain.  Skin: Negative for rash.  Neurological: Negative for dizziness and headaches.  Hematological: Negative for adenopathy. Does not bruise/bleed easily.  Psychiatric/Behavioral: Positive for dysphoric mood. Negative for suicidal ideas, behavioral problems and confusion. The patient is nervous/anxious.        Mood is better. But some anxiety.   Past Medical History  Diagnosis Date  . Hypertension   . High cholesterol   . Hernia, hiatal   . Back pain   . Sciatica   . ETOH abuse     Social History   Social History  . Marital Status: Single    Spouse Name: N/A  . Number of Children: N/A  . Years of Education: N/A   Occupational History  . Not on file.   Social History Main Topics  . Smoking status: Current Every Day Smoker -- 2.00 packs/day    Types: Cigarettes  . Smokeless tobacco: Not on file  . Alcohol Use: 25.2 oz/week    42 Cans of beer per week  . Drug Use: No  . Sexual Activity: Not on file   Other Topics Concern  . Not on file   Social History Narrative    Past Surgical History  Procedure Laterality Date  . Back surgery    . Shoulder arthroscopy    . Cholecystectomy  Family History  Problem Relation Age of Onset  . Diabetes Mother   . Hyperlipidemia Mother   . Hypertension Mother   . Hypertension Father     No Known Allergies  Current Outpatient Prescriptions on File Prior to Visit  Medication Sig Dispense Refill  . albuterol (PROVENTIL HFA;VENTOLIN HFA) 108 (90 Base) MCG/ACT inhaler Inhale 2 puffs into the lungs every 6 (six) hours as needed for wheezing or shortness of breath. 1 Inhaler 0  . fenofibrate micronized (LOFIBRA) 134 MG  capsule TAKE 1 CAPSULE (134 MG TOTAL) BY MOUTH DAILY BEFORE BREAKFAST. 30 capsule 2  . furosemide (LASIX) 40 MG tablet Take 1 tablet (40 mg total) by mouth daily. 90 tablet 0  . pantoprazole (PROTONIX) 40 MG tablet Take 1 tablet (40 mg total) by mouth daily. 30 tablet 0  . potassium chloride SA (K-DUR,KLOR-CON) 20 MEQ tablet Take 2 tablets (40 mEq total) by mouth daily. 90 tablet 0  . prazosin (MINIPRESS) 2 MG capsule Take 1 capsule (2 mg total) by mouth every morning. 30 capsule 0  . rosuvastatin (CRESTOR) 40 MG tablet Take 1 tablet (40 mg total) by mouth daily. 30 tablet 3  . traZODone (DESYREL) 100 MG tablet Take 1 tablet (100 mg total) by mouth at bedtime. 30 tablet 0  . venlafaxine XR (EFFEXOR-XR) 150 MG 24 hr capsule Take 1 capsule (150 mg total) by mouth daily with breakfast. 90 capsule 0   No current facility-administered medications on file prior to visit.    BP 128/90 mmHg  Pulse 111  Temp(Src) 97.7 F (36.5 C) (Oral)  Ht 5\' 3"  (1.6 m)  Wt 191 lb 9.6 oz (86.909 kg)  BMI 33.95 kg/m2  SpO2 96%       Objective:   Physical Exam  General  Mental Status - Alert. General Appearance - Well groomed. Not in acute distress.  Skin Rashes- No Rashes.  HEENT Head- Normal. Ear Auditory Canal - Left- Normal. Right - Normal.Tympanic Membrane- Left- Normal. Right- Normal. Eye Sclera/Conjunctiva- Left- Normal. Right- Normal. Nose & Sinuses Nasal Mucosa- Left-  Boggy and Congested. Right-  Boggy and  Congested.Bilateral maxillary and frontal sinus pressure. Mouth & Throat Lips: Upper Lip- Normal: no dryness, cracking, pallor, cyanosis, or vesicular eruption. Lower Lip-Normal: no dryness, cracking, pallor, cyanosis or vesicular eruption. Buccal Mucosa- Bilateral- No Aphthous ulcers. Oropharynx- No Discharge or Erythema. Tonsils: Characteristics- Bilateral- No Erythema or Congestion. Size/Enlargement- Bilateral- No enlargement. Discharge- bilateral-None.  Neck Neck- Supple. No  Masses.   Chest and Lung Exam Auscultation: Breath Sounds:-Clear even and unlabored.  Cardiovascular Auscultation:Rythm- Regular, rate and rhythm. Murmurs & Other Heart Sounds:Ausculatation of the heart reveal- No Murmurs.  Lymphatic Head & Neck General Head & Neck Lymphatics: Bilateral: Description- No Localized lymphadenopathy.       Assessment & Plan:  For your depression, anxiety and history of suicide attempt, I want you to continue effexor. I do want you to see [facility] and will put in referral. If your mood or anxiety worsens/suicidal thoughts or thoughts harming others then ED evaluation. I am going to give you very limited number of xanax. For further larger prescription you would need to see psychiatrist.   For bronchitis and sinusitis take azithromycin. For nasal congestion flonase. For cough take benzonatate.  Flu test was negative. In your case I do believe test result is correct.  Follow up in 2 weeks or as needed

## 2015-08-08 ENCOUNTER — Telehealth: Payer: Self-pay | Admitting: Medical

## 2015-08-08 MED ORDER — BENZONATATE 100 MG PO CAPS: 100.0000 mg | ORAL_CAPSULE | Freq: Three times a day (TID) | ORAL | Status: DC | PRN

## 2015-08-08 MED FILL — BENZONATATE 100 MG CAPSULE: 100 | 7 days supply | Qty: 21 | Fill #0

## 2015-08-08 NOTE — Telephone Encounter (Signed)
Edward please advise on refill.  

## 2015-08-08 NOTE — Telephone Encounter (Signed)
rx benzonatate send to her pharmacy. If cough persisting or if she is worsening have her make appointment.

## 2015-08-08 NOTE — Telephone Encounter (Signed)
Pharmacy: [place] in [facility]  Reason for call: pt needs refill on benzonatate. She is out for last 2 days.

## 2015-08-09 ENCOUNTER — Other Ambulatory Visit (INDEPENDENT_AMBULATORY_CARE_PROVIDER_SITE_OTHER): Payer: Medicare HMO

## 2015-08-09 DIAGNOSIS — I10 Essential (primary) hypertension: Secondary | ICD-10-CM | POA: Diagnosis not present

## 2015-08-09 LAB — HEPATIC FUNCTION PANEL
ALBUMIN: 4.3 g/dL (ref 3.5–5.2)
ALT: 13 U/L (ref 0–35)
AST: 17 U/L (ref 0–37)
Alkaline Phosphatase: 59 U/L (ref 39–117)
Bilirubin, Direct: 0.1 mg/dL (ref 0.0–0.3)
Total Bilirubin: 0.5 mg/dL (ref 0.2–1.2)
Total Protein: 7.3 g/dL (ref 6.0–8.3)

## 2015-08-11 DIAGNOSIS — F1994 Other psychoactive substance use, unspecified with psychoactive substance-induced mood disorder: Secondary | ICD-10-CM | POA: Insufficient documentation

## 2015-08-11 DIAGNOSIS — R451 Restlessness and agitation: Secondary | ICD-10-CM | POA: Insufficient documentation

## 2015-08-15 ENCOUNTER — Ambulatory Visit (HOSPITAL_BASED_OUTPATIENT_CLINIC_OR_DEPARTMENT_OTHER)
Admission: RE | Admit: 2015-08-15 | Discharge: 2015-08-15 | Disposition: A | Discharge status: Home / Self Care | Payer: Medicare HMO | Source: Ambulatory Visit | Attending: Medical | Admitting: Medical

## 2015-08-15 ENCOUNTER — Telehealth: Payer: Self-pay | Admitting: Medical

## 2015-08-15 ENCOUNTER — Encounter: Payer: Self-pay | Admitting: Medical

## 2015-08-15 ENCOUNTER — Ambulatory Visit (INDEPENDENT_AMBULATORY_CARE_PROVIDER_SITE_OTHER): Payer: Medicare HMO | Admitting: Medical

## 2015-08-15 VITALS — BP 118/76 | HR 88 | Temp 98.1°F | Ht 63.0 in | Wt 192.6 lb

## 2015-08-15 DIAGNOSIS — R0981 Nasal congestion: Secondary | ICD-10-CM

## 2015-08-15 DIAGNOSIS — Z720 Tobacco use: Secondary | ICD-10-CM

## 2015-08-15 DIAGNOSIS — J309 Allergic rhinitis, unspecified: Secondary | ICD-10-CM

## 2015-08-15 DIAGNOSIS — G4700 Insomnia, unspecified: Secondary | ICD-10-CM

## 2015-08-15 DIAGNOSIS — F4323 Adjustment disorder with mixed anxiety and depressed mood: Secondary | ICD-10-CM

## 2015-08-15 DIAGNOSIS — M25562 Pain in left knee: Secondary | ICD-10-CM

## 2015-08-15 DIAGNOSIS — J984 Other disorders of lung: Secondary | ICD-10-CM | POA: Diagnosis not present

## 2015-08-15 DIAGNOSIS — R0781 Pleurodynia: Secondary | ICD-10-CM

## 2015-08-15 DIAGNOSIS — F17200 Nicotine dependence, unspecified, uncomplicated: Secondary | ICD-10-CM

## 2015-08-15 DIAGNOSIS — J209 Acute bronchitis, unspecified: Secondary | ICD-10-CM

## 2015-08-15 MED ORDER — ALPRAZOLAM 0.5 MG PO TABS: ORAL_TABLET | ORAL | Status: DC

## 2015-08-15 MED ORDER — NICOTINE 21 MG/24HR TD PT24: 21.0000 mg | MEDICATED_PATCH | Freq: Every day | TRANSDERMAL | Status: DC

## 2015-08-15 MED ORDER — TRAZODONE HCL 100 MG PO TABS: 100.0000 mg | ORAL_TABLET | Freq: Every day | ORAL | Status: DC

## 2015-08-15 MED ORDER — METHYLPREDNISOLONE ACETATE 40 MG/ML IJ SUSP
40.0000 mg | Freq: Once | INTRAMUSCULAR | Status: AC
  Administered 2015-08-15: 40 mg via INTRAMUSCULAR

## 2015-08-15 MED ORDER — DICLOFENAC SODIUM 75 MG PO TBEC: 75.0000 mg | DELAYED_RELEASE_TABLET | Freq: Two times a day (BID) | ORAL | Status: DC

## 2015-08-15 MED ORDER — DOXYCYCLINE HYCLATE 100 MG PO TABS: 100.0000 mg | ORAL_TABLET | Freq: Two times a day (BID) | ORAL | Status: DC

## 2015-08-15 MED FILL — NAPROXEN 500 MG TABLET: 500 | 30 days supply | Qty: 60 | Fill #1

## 2015-08-15 MED FILL — DOXYCYCLINE 100 MG TABLET: 100 | 10 days supply | Qty: 20 | Fill #0

## 2015-08-15 MED FILL — ALPRAZolam 0.5 MG TABS: 0.5 | 7 days supply | Qty: 14 | Fill #0

## 2015-08-15 NOTE — Telephone Encounter (Signed)
I referred pt to psychiatrist. She states she is expecting call from [facility].Pt has to call. Can you number of [facility]  To the patient?

## 2015-08-15 NOTE — Patient Instructions (Addendum)
For your smoking. Rx nicotine patch. Try to stop smoking.  No completley convinced sinus infection persisting. I think nasal congestion may be allergy related presently. No sinus pressure on exam. Will give depomedrol 40 mg im and continue flonase.  For you left knee will get xray of your knee today. Then decide on med to rx. May refer to ortho if joint space real narrowed. May have some decrease in pain with depomedrol as well.  For bronchitis and rt rib pain will get xray. Since some persisting symptoms will rx doxycycline antibiotic. Use probiotics while on antibiotic.  For insomnia refill trazadone.  For anxiety refill xanax. Ask our referral staff if pt needs to call [facility] or should she wait for a call  Follow up in 4 weeks or as needed. Call us in 2 weeks for update on how she is with her mood.

## 2015-08-15 NOTE — Progress Notes (Signed)
Subjective:    Patient ID: Sarah Barnes, female    DOB: 1950/10/05, 65 y.o.   MRN: [id number]  HPI   Pt in states still feeling well with her mood  and anxiety. No suicidal ideation. Pt was referred to [facility]. They never called her.  Pt states her sinus are still feeling congested. Sill coughing up mucous. Last visit gave zpack, flonase and benzonatate. Some rt rib pain recently on coughing. Still some chest congestion.  Pt is smoking. About a pack a day. Was smoking 2 packs a day. Pt was smoking since 65 years old.  Pt also reports left knee pain. Pt reports some medial aspect swelling. No injury or fall. Pain going up and down steps pain is worse. She states pain for about year if not longer.   Review of Systems  Constitutional: Negative for fever, chills and fatigue.  HENT: Positive for congestion and sneezing. Negative for drooling, ear pain, hearing loss, sinus pressure and sore throat.   Respiratory: Positive for cough. Negative for apnea, shortness of breath and wheezing.        Some production.  Cardiovascular: Negative for chest pain and palpitations.  Gastrointestinal: Negative for abdominal pain.  Musculoskeletal:       Lt knee crepitus on rom. Pain.  Skin: Negative for rash.  Psychiatric/Behavioral: Negative for suicidal ideas, behavioral problems, confusion and dysphoric mood. The patient is nervous/anxious.        Mood and anxiety controlled.   Past Medical History  Diagnosis Date  . Hypertension   . High cholesterol   . Hernia, hiatal   . Back pain   . Sciatica   . ETOH abuse     Social History   Social History  . Marital Status: Single    Spouse Name: N/A  . Number of Children: N/A  . Years of Education: N/A   Occupational History  . Not on file.   Social History Main Topics  . Smoking status: Current Every Day Smoker -- 2.00 packs/day    Types: Cigarettes  . Smokeless tobacco: Not on file  . Alcohol Use: 25.2 oz/week    42 Cans of  beer per week  . Drug Use: No  . Sexual Activity: Not on file   Other Topics Concern  . Not on file   Social History Narrative    Past Surgical History  Procedure Laterality Date  . Back surgery    . Shoulder arthroscopy    . Cholecystectomy      Family History  Problem Relation Age of Onset  . Diabetes Mother   . Hyperlipidemia Mother   . Hypertension Mother   . Hypertension Father     No Known Allergies  Current Outpatient Prescriptions on File Prior to Visit  Medication Sig Dispense Refill  . albuterol (PROVENTIL HFA;VENTOLIN HFA) 108 (90 Base) MCG/ACT inhaler Inhale 2 puffs into the lungs every 6 (six) hours as needed for wheezing or shortness of breath. 1 Inhaler 0  . ALPRAZolam (XANAX) 0.5 MG tablet 1/2-1 tab po bid prn anxiety 14 tablet 0  . fenofibrate micronized (LOFIBRA) 134 MG capsule TAKE 1 CAPSULE (134 MG TOTAL) BY MOUTH DAILY BEFORE BREAKFAST. 30 capsule 2  . fluticasone (FLONASE) 50 MCG/ACT nasal spray Place 2 sprays into both nostrils daily. 16 g 1  . furosemide (LASIX) 40 MG tablet Take 1 tablet (40 mg total) by mouth daily. 90 tablet 0  . pantoprazole (PROTONIX) 40 MG tablet Take 1 tablet (40 mg  total) by mouth daily. 30 tablet 0  . potassium chloride SA (K-DUR,KLOR-CON) 20 MEQ tablet Take 2 tablets (40 mEq total) by mouth daily. 90 tablet 0  . prazosin (MINIPRESS) 2 MG capsule Take 1 capsule (2 mg total) by mouth every morning. 30 capsule 0  . rosuvastatin (CRESTOR) 40 MG tablet Take 1 tablet (40 mg total) by mouth daily. 30 tablet 3  . traZODone (DESYREL) 100 MG tablet Take 1 tablet (100 mg total) by mouth at bedtime. 30 tablet 0  . venlafaxine XR (EFFEXOR-XR) 150 MG 24 hr capsule Take 1 capsule (150 mg total) by mouth daily with breakfast. 90 capsule 0   No current facility-administered medications on file prior to visit.    BP 118/76 mmHg  Pulse 88  Temp(Src) 98.1 F (36.7 C) (Oral)  Ht 5\' 3"  (1.6 m)  Wt 192 lb 9.6 oz (87.363 kg)  BMI 34.13  kg/m2  SpO2 95%       Objective:   Physical Exam  General  Mental Status - Alert. General Appearance - Well groomed. Not in acute distress.  Skin Rashes- No Rashes.  HEENT Head- Normal. Ear Auditory Canal - Left- Normal. Right - Normal.Tympanic Membrane- Left- Normal. Right- Normal. Eye Sclera/Conjunctiva- Left- Normal. Right- Normal. Nose & Sinuses Nasal Mucosa- Left-  Boggy and Congested. Right-  Boggy and  Congested.Bilateral  No maxillary and  No frontal sinus pressure. Mouth & Throat Lips: Upper Lip- Normal: no dryness, cracking, pallor, cyanosis, or vesicular eruption. Lower Lip-Normal: no dryness, cracking, pallor, cyanosis or vesicular eruption. Buccal Mucosa- Bilateral- No Aphthous ulcers. Oropharynx- No Discharge or Erythema. Tonsils: Characteristics- Bilateral- No Erythema or Congestion. Size/Enlargement- Bilateral- No enlargement. Discharge- bilateral-None.  Neck Neck- Supple. No Masses.   Chest and Lung Exam Auscultation: Breath Sounds:- even and unlabored.(rt rib upper axillary area mild tender to palpation and when she coughs) Cardiovascular Auscultation:Rythm- Regular, rate and rhythm. Murmurs & Other Heart Sounds:Ausculatation of the heart reveal- No Murmurs.  Lymphatic Head & Neck General Head & Neck Lymphatics: Bilateral: Description- No Localized lymphadenopathy.  Lt knee- on rom, flexion and extension crepitus. Mild medial swelling.      Assessment & Plan:  For your smoking. Rx nicotine patch. Try to stop smoking.  No completley convinced sinus infection persisting. I think nasal congestion may be allergy related presently. No sinus pressure on exam. Will give depomedrol 40 mg im and continue flonase.  For you left knee will get xray of your knee today. Then decide on med to rx. May refer to ortho if joint space real narrowed. May have some decrease in pain with depomedrol as well.  For bronchitis and rt rib pain will get xray. Since some  persisting symptoms will rx doxycycline antibiotic. Use probiotics while on antibiotic.  For insomnia refill trazadone.  For anxiety refill xanax. Ask our referral staff if pt needs to call [facility] or should she wait for a call  Follow up in 4 weeks or as needed. Call us in 2 weeks for update on how she is with her mood.  After pt left I asked MA Barnet Pall to call pt and ask her to come back in 2 weeks. I don't think she need lasix today. This appears to have been started in hospital. Will assess in 2 weeks. Check her bp level and legs for any pedal edema. Decide if she needs any further lasix on follow up. Presently not prescribing.

## 2015-08-15 NOTE — Telephone Encounter (Signed)
Referal to orthopedist

## 2015-08-15 NOTE — Progress Notes (Signed)
Pre visit review using our clinic review tool, if applicable. No additional management support is needed unless otherwise documented below in the visit note. 

## 2015-08-15 NOTE — Telephone Encounter (Signed)
Would you send in trazadone rx. To [facility]. 90 tabs. 3 refill. I tab po q day.

## 2015-08-15 NOTE — Telephone Encounter (Signed)
rx diclofenac for knee pain. Stop any other otc nsaids such allever or ibuprofen.

## 2015-08-15 NOTE — Telephone Encounter (Signed)
The nicotine patch sent to [facility]. Can cancel please.

## 2015-08-16 ENCOUNTER — Other Ambulatory Visit: Payer: Self-pay | Admitting: Psychiatry

## 2015-08-16 MED FILL — DICLOFENAC SOD EC 75 MG TAB: 75 | 15 days supply | Qty: 30 | Fill #0

## 2015-08-24 ENCOUNTER — Telehealth: Payer: Self-pay | Admitting: Medical

## 2015-08-24 MED ORDER — PRAZOSIN HCL 2 MG PO CAPS: 2.0000 mg | ORAL_CAPSULE | Freq: Every morning | ORAL | Status: DC

## 2015-08-24 MED ORDER — PANTOPRAZOLE SODIUM 40 MG PO TBEC: 40.0000 mg | DELAYED_RELEASE_TABLET | Freq: Every day | ORAL | Status: DC

## 2015-08-24 NOTE — Telephone Encounter (Signed)
Pt called in to request a refill on her medications.  [phone] ([facility])  1.   pantoprazole     2.   prazosin

## 2015-08-24 NOTE — Telephone Encounter (Signed)
Rx sent to pharmacy 08/24/15.

## 2015-08-26 ENCOUNTER — Telehealth: Payer: Self-pay | Admitting: Medical

## 2015-08-26 NOTE — Telephone Encounter (Signed)
Pt had a appt on 3/24. Pt says that she was advised to call in to update provider on how she's feeling. Pt says that she is feeling much better.    Thanks.

## 2015-09-15 ENCOUNTER — Telehealth: Payer: Self-pay | Admitting: Medical

## 2015-09-15 ENCOUNTER — Encounter: Payer: Medicare HMO | Admitting: Medical

## 2015-09-15 NOTE — Progress Notes (Signed)
This encounter was created in error - please disregard.

## 2015-09-19 ENCOUNTER — Ambulatory Visit (HOSPITAL_BASED_OUTPATIENT_CLINIC_OR_DEPARTMENT_OTHER)
Admission: RE | Admit: 2015-09-19 | Discharge: 2015-09-19 | Disposition: A | Discharge status: Home / Self Care | Payer: Medicare HMO | Source: Ambulatory Visit | Attending: Medical | Admitting: Medical

## 2015-09-19 ENCOUNTER — Ambulatory Visit (INDEPENDENT_AMBULATORY_CARE_PROVIDER_SITE_OTHER): Payer: Medicare HMO | Admitting: Medical

## 2015-09-19 ENCOUNTER — Encounter: Payer: Self-pay | Admitting: Medical

## 2015-09-19 VITALS — BP 122/78 | HR 88 | Temp 98.1°F | Ht 62.0 in | Wt 191.6 lb

## 2015-09-19 DIAGNOSIS — R42 Dizziness and giddiness: Secondary | ICD-10-CM

## 2015-09-19 DIAGNOSIS — H538 Other visual disturbances: Secondary | ICD-10-CM

## 2015-09-19 DIAGNOSIS — H5711 Ocular pain, right eye: Secondary | ICD-10-CM | POA: Diagnosis not present

## 2015-09-19 DIAGNOSIS — R519 Headache, unspecified: Secondary | ICD-10-CM

## 2015-09-19 DIAGNOSIS — F411 Generalized anxiety disorder: Secondary | ICD-10-CM | POA: Diagnosis not present

## 2015-09-19 DIAGNOSIS — R51 Headache: Secondary | ICD-10-CM | POA: Diagnosis present

## 2015-09-19 DIAGNOSIS — L989 Disorder of the skin and subcutaneous tissue, unspecified: Secondary | ICD-10-CM

## 2015-09-19 LAB — SEDIMENTATION RATE: Sed Rate: 25 mm/hr — ABNORMAL HIGH (ref 0–22)

## 2015-09-19 MED ORDER — DICLOFENAC SODIUM 75 MG PO TBEC: 75.0000 mg | DELAYED_RELEASE_TABLET | Freq: Two times a day (BID) | ORAL | Status: DC

## 2015-09-19 MED ORDER — PREDNISONE 20 MG PO TABS: ORAL_TABLET | ORAL | Status: DC

## 2015-09-19 MED ORDER — ALPRAZOLAM 0.5 MG PO TABS: ORAL_TABLET | ORAL | Status: DC

## 2015-09-19 MED FILL — ALPRAZolam 0.5 MG TABS: 0.5 | 7 days supply | Qty: 14 | Fill #0

## 2015-09-19 MED FILL — DICLOFENAC SOD EC 75 MG TAB: 75 | 15 days supply | Qty: 30 | Fill #0

## 2015-09-19 NOTE — Progress Notes (Signed)
Subjective:    Patient ID: Sarah Barnes, female    DOB: 26-Oct-1950, 66 y.o.   MRN: [id number]  HPI  Pt in for follow up. She has new complaint today of daily ha. Pt states she has been taking diclofenac and naproxen.(I explained to her only take diclofenac if needed).Not both she expressed understanding.  Pt states her ha do respond to medications but not for long. Pt feels that she feels some occasional dizziness. This happened for 2 days. She feels tension in her neck. Neck muscles feel tight. Pt denies any nausea. No light sensitivity.   Pain of head mostly rt side.   Pt states rare ha in the past before.   Pt states daily ha for 2-3 weeks that will occur daily and respond briefly to diclofenac or naproxen.  Pt on review she refuses to see psychiatrist. She states the copay is too high. Pt denies any homicidal or suicidal ideations.   Rt temporal mole for 3 years. Pt is concerned about this.     Review of Systems  Constitutional: Negative for fever, chills and fatigue.  Respiratory: Negative for cough, chest tightness, shortness of breath and wheezing.   Cardiovascular: Negative for chest pain and palpitations.  Gastrointestinal: Negative for abdominal pain.  Skin: Negative for rash.  Neurological: Positive for dizziness, light-headedness and headaches. Negative for seizures, syncope, facial asymmetry, speech difficulty, weakness and numbness.       Rare dizziness.   Presnently low level ha since this am. Describes periorbital ha. Then states maybe pain in her eye. Some transient blurred vision.  Hematological: Negative for adenopathy. Does not bruise/bleed easily.  Psychiatric/Behavioral: Negative for behavioral problems, confusion and dysphoric mood.    Past Medical History  Diagnosis Date  . Hypertension   . High cholesterol   . Hernia, hiatal   . Back pain   . Sciatica   . ETOH abuse      Social History   Social History  . Marital Status: Single   Spouse Name: N/A  . Number of Children: N/A  . Years of Education: N/A   Occupational History  . Not on file.   Social History Main Topics  . Smoking status: Current Every Day Smoker -- 2.00 packs/day    Types: Cigarettes  . Smokeless tobacco: Not on file  . Alcohol Use: 25.2 oz/week    42 Cans of beer per week  . Drug Use: No  . Sexual Activity: Not on file   Other Topics Concern  . Not on file   Social History Narrative    Past Surgical History  Procedure Laterality Date  . Back surgery    . Shoulder arthroscopy    . Cholecystectomy      Family History  Problem Relation Age of Onset  . Diabetes Mother   . Hyperlipidemia Mother   . Hypertension Mother   . Hypertension Father     No Known Allergies  Current Outpatient Prescriptions on File Prior to Visit  Medication Sig Dispense Refill  . albuterol (PROVENTIL HFA;VENTOLIN HFA) 108 (90 Base) MCG/ACT inhaler Inhale 2 puffs into the lungs every 6 (six) hours as needed for wheezing or shortness of breath. 1 Inhaler 0  . ALPRAZolam (XANAX) 0.5 MG tablet 1/2-1 tab po bid prn anxiety 14 tablet 0  . diclofenac (VOLTAREN) 75 MG EC tablet Take 1 tablet (75 mg total) by mouth 2 (two) times daily. 30 tablet 0  . doxycycline (VIBRA-TABS) 100 MG tablet Take 1 tablet (  100 mg total) by mouth 2 (two) times daily. 20 tablet 0  . fenofibrate micronized (LOFIBRA) 134 MG capsule TAKE 1 CAPSULE (134 MG TOTAL) BY MOUTH DAILY BEFORE BREAKFAST. 30 capsule 2  . fluticasone (FLONASE) 50 MCG/ACT nasal spray Place 2 sprays into both nostrils daily. 16 g 1  . furosemide (LASIX) 40 MG tablet Take 1 tablet (40 mg total) by mouth daily. 90 tablet 0  . nicotine (NICODERM CQ - DOSED IN MG/24 HOURS) 21 mg/24hr patch Place 1 patch (21 mg total) onto the skin daily. 28 patch 0  . pantoprazole (PROTONIX) 40 MG tablet Take 1 tablet (40 mg total) by mouth daily. 90 tablet 1  . potassium chloride SA (K-DUR,KLOR-CON) 20 MEQ tablet Take 2 tablets (40 mEq  total) by mouth daily. 90 tablet 0  . prazosin (MINIPRESS) 2 MG capsule Take 1 capsule (2 mg total) by mouth every morning. 90 capsule 1  . rosuvastatin (CRESTOR) 40 MG tablet Take 1 tablet (40 mg total) by mouth daily. 30 tablet 3  . traZODone (DESYREL) 100 MG tablet Take 1 tablet (100 mg total) by mouth at bedtime. 90 tablet 3  . venlafaxine XR (EFFEXOR-XR) 150 MG 24 hr capsule Take 1 capsule (150 mg total) by mouth daily with breakfast. 90 capsule 0   No current facility-administered medications on file prior to visit.    BP 122/78 mmHg  Pulse 88  Temp(Src) 98.1 F (36.7 C) (Oral)  Ht 5\' 2"  (1.575 m)  Wt 191 lb 9.6 oz (86.909 kg)  BMI 35.04 kg/m2       Objective:   Physical Exam  General Mental Status- Alert. General Appearance- Not in acute distress.   Eyes- Peerl. Rt eye on palpation of upper lid/pressure against globe mild pain.    Skin General: Color- Normal Color. Moisture- Normal Moisture. Rt temporal area 8-9 mm amorphous shaped mole. Dark in appearance.  Neck Carotid Arteries- Normal color. Moisture- Normal Moisture. No carotid bruits. No JVD.  Chest and Lung Exam Auscultation: Breath Sounds:-Normal.  Cardiovascular Auscultation:Rythm- Regular. Murmurs & Other Heart Sounds:Auscultation of the heart reveals- No Murmurs.  Abdomen Inspection:-Inspeection Normal. Palpation/Percussion:Note:No mass. Palpation and Percussion of the abdomen reveal- Non Tender, Non Distended + BS, no rebound or guarding.  Neurologic Cranial Nerve exam:- CN III-XII intact(No nystagmus), symmetric smile. Drift Test:- No drift. Finger to Nose:- Normal/Intact Strength:- 5/5 equal and symmetric strength both upper and lower extremities.      Assessment & Plan:  For your new HA over past 3 weeks will get CT of the head without contrast stat and sed rate. Will advise using diclofenac pending review of tests(may rx other med after results in). If you have worse signs or symptoms as  described pending studies then ED evaluation.  Since you have recent eye pain and some recent blurred vision, I do think referral to optometrist would be a good idea.  For you anxiety and depression. Continue effexor and xanax. I would still strongly recommend you accept the psychiatry referral despite high copay cost.  Will refer to dermatologist for for rt temporal area irregular shaped mole.  Follow up in one month or as needed  Note also expresses financial problems and that often times can't make specialist appointments due to cost.   Cayden Rautio, Percell Miller, PA-C

## 2015-09-19 NOTE — Telephone Encounter (Signed)
Pt was no show 09/15/15 9:15am for  Follow up appt, pt came in 09/19/15, 1st no show w/in 12 months, charge or no charge?

## 2015-09-19 NOTE — Telephone Encounter (Signed)
Spoke with pt and she voices understanding to stop the Diclofenac while she is on the prednisone. Pt states that she will go to the eye doctor tomorrow and she states that the doctor "is killing me sending me to all these specialist and I can't afford all of these other doctors". Pt is aware that the ENT will call her with that appointment.

## 2015-09-19 NOTE — Patient Instructions (Addendum)
For your new HA over past 3 weeks will get CT of the head without contrast stat and sed rate. Will advise using diclofenac pending review of tests(may rx other med after results in). If you have worse signs or symptoms as described pending studies then ED evaluation.  Since you have recent eye pain and some recent blurred vision, I do think referral to optometrist would be a good idea.  For you anxiety and depression. Continue effexor and xanax. I would still strongly recommend you accept the psychiatry referral despite high copay cost. Any suicidal ideas or thought of hurting others then ED evaluaton.  Will refer to dermatologist for for rt temporal area irregular shaped mole.  Follow up in one month or as needed

## 2015-09-19 NOTE — Progress Notes (Signed)
Pre visit review using our clinic review tool, if applicable. No additional management support is needed unless otherwise documented below in the visit note. 

## 2015-09-19 NOTE — Telephone Encounter (Signed)
Can you check on pt psychiatry referral. Has it been done yet?

## 2015-09-19 NOTE — Telephone Encounter (Signed)
Once sent to psychiatric we no longer can see it, due to HIPPA

## 2015-09-19 NOTE — Telephone Encounter (Signed)
Pt has some recent rt eye region pain, blurred vision, and sed rate elevated at 25. Maybe temporal arteritis. Pt will be placed on prednisone over next 5 days. But can ENT see her this week for evaluation. Will you advise pt to stop diclofenac while she starts prednisone. ENT will evaluate artery around eye.

## 2015-09-19 NOTE — Telephone Encounter (Signed)
No charge. 

## 2015-09-22 ENCOUNTER — Other Ambulatory Visit: Payer: Self-pay | Admitting: Medical

## 2015-09-29 MED FILL — ALPRAZolam 0.5 MG TABS: 0.5 | 7 days supply | Qty: 14 | Fill #1

## 2015-09-30 MED FILL — ROSUVASTATIN CALCIUM 40 MG: 40 | 30 days supply | Qty: 30 | Fill #2

## 2015-10-20 ENCOUNTER — Ambulatory Visit (INDEPENDENT_AMBULATORY_CARE_PROVIDER_SITE_OTHER): Payer: Medicare HMO | Admitting: Medical

## 2015-10-20 ENCOUNTER — Encounter: Payer: Self-pay | Admitting: Medical

## 2015-10-20 VITALS — BP 128/86 | HR 88 | Temp 98.1°F | Ht 62.0 in | Wt 194.4 lb

## 2015-10-20 DIAGNOSIS — R700 Elevated erythrocyte sedimentation rate: Secondary | ICD-10-CM

## 2015-10-20 DIAGNOSIS — L989 Disorder of the skin and subcutaneous tissue, unspecified: Secondary | ICD-10-CM

## 2015-10-20 DIAGNOSIS — F411 Generalized anxiety disorder: Secondary | ICD-10-CM

## 2015-10-20 DIAGNOSIS — M2550 Pain in unspecified joint: Secondary | ICD-10-CM

## 2015-10-20 DIAGNOSIS — E785 Hyperlipidemia, unspecified: Secondary | ICD-10-CM

## 2015-10-20 LAB — LIPID PANEL
CHOLESTEROL: 177 mg/dL (ref 0–200)
HDL: 49.4 mg/dL (ref >39.00)
NONHDL: 127.31
TRIGLYCERIDES: 347 mg/dL — AB (ref 0.0–149.0)
Total CHOL/HDL Ratio: 4
VLDL: 69.4 mg/dL — ABNORMAL HIGH (ref 0.0–40.0)

## 2015-10-20 LAB — C-REACTIVE PROTEIN: CRP: 0.3 mg/dL — AB (ref 0.5–20.0)

## 2015-10-20 LAB — LDL CHOLESTEROL, DIRECT: Direct LDL: 66 mg/dL

## 2015-10-20 LAB — RHEUMATOID FACTOR

## 2015-10-20 LAB — SEDIMENTATION RATE: Sed Rate: 13 mm/hr (ref 0–30)

## 2015-10-20 MED ORDER — DICLOFENAC SODIUM 75 MG PO TBEC: 75.0000 mg | DELAYED_RELEASE_TABLET | Freq: Two times a day (BID) | ORAL | Status: DC

## 2015-10-20 MED ORDER — ALPRAZOLAM 0.5 MG PO TABS: ORAL_TABLET | ORAL | Status: DC

## 2015-10-20 MED FILL — DICLOFENAC SOD EC 75 MG TAB: 75 | 15 days supply | Qty: 30 | Fill #0

## 2015-10-20 MED FILL — ALPRAZolam 0.5 MG TABS: 0.5 | 10 days supply | Qty: 20 | Fill #0

## 2015-10-20 NOTE — Progress Notes (Signed)
Pre visit review using our clinic review tool, if applicable. No additional management support is needed unless otherwise documented below in the visit note. 

## 2015-10-20 NOTE — Patient Instructions (Addendum)
You declined dermatologist referral due to cost if you change your mind please let me know and will re-refer.  For elevated sed rate will repeat today. Your headache and vision symptoms resolved. So the prednisone may not be necessary.  For diffuse joint pain will get RA panel. Will go ahead and refill diclofenac.   For anxiety will go ahead and refill you xanax.  Will recheck lipid panel today.  Follow up 2 months or as needed

## 2015-10-20 NOTE — Progress Notes (Signed)
Subjective:    Patient ID: Sarah Barnes, female    DOB: [birth year], 65 y.o.   MRN: [id number]  HPI   Pt did not go to dermatologist. She states can't afford appointments. Pt made aware of importance. I will re-refer her when she wants.   Pt went to eye MD to get eyes checked. This stopped her HA. Pt Ct of head was negative. Pt sed rate was elevated. I rx'd prednisone but she never took.  Pt states she is still anxious. Has generalized anxiety disorder. No homicidal or suicidal ideations.  Wants diclofenac refill for diffuse arthralgias. Knee elbows and shoulders.     Review of Systems  Constitutional: Negative for fever, chills and fatigue.  Eyes: Negative for photophobia, itching and visual disturbance.  Respiratory: Negative for cough, chest tightness, wheezing and stridor.   Cardiovascular: Negative for chest pain and palpitations.  Genitourinary: Negative for dysuria and menstrual problem.  Musculoskeletal: Positive for arthralgias.  Neurological: Negative for dizziness and headaches.  Hematological: Negative for adenopathy. Does not bruise/bleed easily.  Psychiatric/Behavioral: The patient is nervous/anxious.      Past Medical History  Diagnosis Date  . Hypertension   . High cholesterol   . Hernia, hiatal   . Back pain   . Sciatica   . ETOH abuse      Social History   Social History  . Marital Status: Single    Spouse Name: N/A  . Number of Children: N/A  . Years of Education: N/A   Occupational History  . Not on file.   Social History Main Topics  . Smoking status: Current Every Day Smoker -- 2.00 packs/day    Types: Cigarettes  . Smokeless tobacco: Not on file  . Alcohol Use: 25.2 oz/week    42 Cans of beer per week  . Drug Use: No  . Sexual Activity: Not on file   Other Topics Concern  . Not on file   Social History Narrative    Past Surgical History  Procedure Laterality Date  . Back surgery    . Shoulder arthroscopy    .  Cholecystectomy      Family History  Problem Relation Age of Onset  . Diabetes Mother   . Hyperlipidemia Mother   . Hypertension Mother   . Hypertension Father     No Known Allergies  Current Outpatient Prescriptions on File Prior to Visit  Medication Sig Dispense Refill  . albuterol (PROVENTIL HFA;VENTOLIN HFA) 108 (90 Base) MCG/ACT inhaler Inhale 2 puffs into the lungs every 6 (six) hours as needed for wheezing or shortness of breath. 1 Inhaler 0  . ALPRAZolam (XANAX) 0.5 MG tablet 1/2-1 tab po bid prn anxiety 14 tablet 1  . diclofenac (VOLTAREN) 75 MG EC tablet Take 1 tablet (75 mg total) by mouth 2 (two) times daily. 30 tablet 0  . doxycycline (VIBRA-TABS) 100 MG tablet Take 1 tablet (100 mg total) by mouth 2 (two) times daily. 20 tablet 0  . fenofibrate micronized (LOFIBRA) 134 MG capsule TAKE 1 CAPSULE (134 MG TOTAL) BY MOUTH DAILY BEFORE BREAKFAST. 30 capsule 2  . fluticasone (FLONASE) 50 MCG/ACT nasal spray Place 2 sprays into both nostrils daily. 16 g 1  . furosemide (LASIX) 40 MG tablet TAKE 1 TABLET EVERY DAY 90 tablet 0  . nicotine (NICODERM CQ - DOSED IN MG/24 HOURS) 21 mg/24hr patch Place 1 patch (21 mg total) onto the skin daily. 28 patch 0  . pantoprazole (PROTONIX) 40 MG tablet Take  1 tablet (40 mg total) by mouth daily. 90 tablet 1  . potassium chloride SA (K-DUR,KLOR-CON) 20 MEQ tablet TAKE 2 TABLETS EVERY DAY 90 tablet 0  . prazosin (MINIPRESS) 2 MG capsule Take 1 capsule (2 mg total) by mouth every morning. 90 capsule 1  . predniSONE (DELTASONE) 20 MG tablet 3 tab po q day for 5 days. 15 tablet 0  . rosuvastatin (CRESTOR) 40 MG tablet Take 1 tablet (40 mg total) by mouth daily. 30 tablet 3  . traZODone (DESYREL) 100 MG tablet Take 1 tablet (100 mg total) by mouth at bedtime. 90 tablet 3  . venlafaxine XR (EFFEXOR-XR) 150 MG 24 hr capsule TAKE 1 CAPSULE EVERY DAY WITH BREAKFAST 90 capsule 0   No current facility-administered medications on file prior to visit.     BP 128/86 mmHg  Pulse 88  Temp(Src) 98.1 F (36.7 C) (Oral)  Ht 5\' 2"  (1.575 m)  Wt 194 lb 6.4 oz (88.179 kg)  BMI 35.55 kg/m2  SpO2 92%       Objective:   Physical Exam  General Mental Status- Alert. General Appearance- Not in acute distress.   Skin Rt temporal skin lesion same as before.  Neck Carotid Arteries- Normal color. Moisture- Normal Moisture. No carotid bruits. No JVD.  Chest and Lung Exam Auscultation: Breath Sounds:-Normal.  Cardiovascular Auscultation:Rythm- Regular. Murmurs & Other Heart Sounds:Auscultation of the heart reveals- No Murmurs.  Neurologic Cranial Nerve exam:- CN III-XII intact(No nystagmus), symmetric smile. Strength:- 5/5 equal and symmetric strength both upper and lower extremities.      Assessment & Plan:  You declined dermatologist referral due to cost if you change your mind please let me know and will re-refer.  For elevated sed rate will repeat today. Your headache and vision symptoms resolved. So the prednisone may not be necessary.  For diffuse joint pain will get RA panel. Will go ahead and refill diclofenac.   For anxiety will go ahead and refill you xanax.  Will recheck lipid panel today.  Follow up 2 months or as needed  Armen Waring, Percell Miller, [employer]

## 2015-10-21 ENCOUNTER — Telehealth: Payer: Self-pay | Admitting: Medical

## 2015-10-21 DIAGNOSIS — R768 Other specified abnormal immunological findings in serum: Secondary | ICD-10-CM

## 2015-10-21 DIAGNOSIS — M2550 Pain in unspecified joint: Secondary | ICD-10-CM

## 2015-10-21 LAB — ANTI-NUCLEAR AB-TITER (ANA TITER)

## 2015-10-21 LAB — ANA: ANA: POSITIVE — AB

## 2015-10-21 MED ORDER — FENOFIBRATE MICRONIZED 200 MG PO CAPS: 200.0000 mg | ORAL_CAPSULE | Freq: Every day | ORAL | Status: DC

## 2015-10-21 NOTE — Telephone Encounter (Signed)
Pt triglycerides were elevated. So I sent in rx increasing her fenofibrate dose. Stop lower dose fenofibrate. Also her ana level were elevated. She has joint pain. Also fatigue if I am not mistaken. Sometimes with elevated ana may indicate lupus. So I put in referral to rheumatologist. She mentioned before that has no money to see specialist. But I would recommend seeing rhuematologist. Her overall sed rate did come down.

## 2015-10-21 NOTE — Telephone Encounter (Signed)
Left message for pt to call back with any questions about her lab results and the referral to rheumatology.

## 2015-10-24 NOTE — Telephone Encounter (Signed)
Called and [facility] @ 1:50pm @ [phone]) asking the pt to RTC regarding message below.//AB/CMA

## 2015-10-24 NOTE — Telephone Encounter (Signed)
Can be reached: [phone]   Reason for call: pt requesting call back about meds. She didn't understand the message. Please call before 10:00am today or after 11:30am today.

## 2015-10-25 NOTE — Telephone Encounter (Signed)
Pt called back in to speak with PCP or CMA. Please call back to advise.    Thanks.Marland Kitchen

## 2015-10-26 NOTE — Telephone Encounter (Signed)
Left message for pt to call back with any questions regarding the message below.

## 2015-10-28 NOTE — Telephone Encounter (Signed)
Letter mailed to pt explaining the note below.

## 2015-10-31 ENCOUNTER — Telehealth: Payer: Self-pay | Admitting: Medical

## 2015-10-31 MED FILL — ALPRAZolam 0.5 MG TABS: 0.5 | 10 days supply | Qty: 20 | Fill #1

## 2015-11-01 ENCOUNTER — Other Ambulatory Visit: Payer: Self-pay | Admitting: Medical

## 2015-11-01 MED ORDER — FENOFIBRATE 160 MG PO TABS: 160.0000 mg | ORAL_TABLET | Freq: Every day | ORAL | Status: DC

## 2015-11-01 NOTE — Telephone Encounter (Signed)
°  Relation to [id number] Call back [place]  Reason for call: pt is needing rx fenofibrate micronized (LOFIBRA) 200 MG capsule, pt states it is to expensive at [facility].

## 2015-11-01 NOTE — Telephone Encounter (Signed)
Spoke with pam and she states that the 200mg  dose was expensive for the pt. Please advise if the medication can be changed to a different medication for the pt.   I also spoke with Burna at [facility] and she stated that for a 90 day supply that the medication would cost $154.20 and this medication is not covered. Please advise.

## 2015-11-01 NOTE — Telephone Encounter (Signed)
Spoke with Rodman Key (customer service) at [facility] to see if there was an alternative to the fenofibrate micronized 200 mg capsule taken once a day. I was then transferred to [place] who advised me that she had to check the patients plan to see if an alternative was available. She states that 160 mg dose is covered and the price for that strength is covered with no copay for the pt.   Also advised the pt that the pharmacy would cover 160 mg dose of fenofibrate for the pt. Pt voices understanding.

## 2015-11-01 NOTE — Telephone Encounter (Signed)
Thanks for working on that. 160 mg dose rx. Repeat lipid panel in 3 months fasting.

## 2015-11-17 ENCOUNTER — Other Ambulatory Visit: Payer: Self-pay | Admitting: Medical

## 2015-11-17 MED FILL — ALPRAZolam 0.5 MG TABS: 0.5 | 10 days supply | Qty: 20 | Fill #0

## 2015-11-17 MED FILL — DICLOFENAC SOD EC 75 MG TAB: 75 | 15 days supply | Qty: 30 | Fill #0

## 2015-11-17 NOTE — Telephone Encounter (Signed)
Rx faxed to pharmacy 11/17/15.

## 2015-11-17 NOTE — Telephone Encounter (Signed)
Refilled pt xanax. She can pick up rx.

## 2015-11-29 ENCOUNTER — Other Ambulatory Visit: Payer: Self-pay | Admitting: Medical

## 2015-12-05 MED FILL — ALPRAZolam 0.5 MG TABS: 0.5 | 10 days supply | Qty: 20 | Fill #1

## 2015-12-27 ENCOUNTER — Other Ambulatory Visit: Payer: Self-pay | Admitting: Medical

## 2015-12-27 ENCOUNTER — Ambulatory Visit (INDEPENDENT_AMBULATORY_CARE_PROVIDER_SITE_OTHER): Payer: Medicare HMO | Admitting: Medical

## 2015-12-27 ENCOUNTER — Encounter: Payer: Self-pay | Admitting: Medical

## 2015-12-27 VITALS — BP 130/86 | HR 78 | Temp 98.1°F | Resp 16 | Ht 62.0 in | Wt 192.2 lb

## 2015-12-27 DIAGNOSIS — F32A Depression, unspecified: Secondary | ICD-10-CM

## 2015-12-27 DIAGNOSIS — F329 Major depressive disorder, single episode, unspecified: Secondary | ICD-10-CM

## 2015-12-27 DIAGNOSIS — F411 Generalized anxiety disorder: Secondary | ICD-10-CM

## 2015-12-27 MED ORDER — ALPRAZOLAM 0.5 MG PO TABS: 0.5000 mg | ORAL_TABLET | Freq: Every evening | ORAL | 1 refills | Status: DC | PRN

## 2015-12-27 MED FILL — ALPRAZolam 0.5 MG TABS: 0.5 | 30 days supply | Qty: 30 | Fill #0
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
## 2015-12-27 NOTE — Progress Notes (Signed)
Pre visit review using our clinic review tool, if applicable. No additional management support is needed unless otherwise documented below in the visit note./HSM  

## 2015-12-27 NOTE — Patient Instructions (Addendum)
For your anxiety and depression.I want you to continue the effexor and will give you more xanax. But I am only giving enough for two weeks with one refill. Then will see you in one month to see how you are doing.(sooner if needed). Will get uds today. Strong reminder not to drink alcohol. Please reconsider my request for your to see [facility]. I think this would be very beneficial. If any thoughts harming others  or self then ED evaluation [facility](or other ED).

## 2015-12-29 ENCOUNTER — Other Ambulatory Visit: Payer: Self-pay

## 2015-12-29 MED ORDER — DICLOFENAC SODIUM 75 MG PO TBEC: DELAYED_RELEASE_TABLET | ORAL | 1 refills | Status: DC

## 2015-12-29 MED FILL — DICLOFENAC SOD EC 75 MG TAB: 75 | 30 days supply | Qty: 60 | Fill #0

## 2015-12-29 NOTE — Telephone Encounter (Signed)
Rx for diclofenac filled 12/29/15.

## 2016-01-02 ENCOUNTER — Other Ambulatory Visit: Payer: Self-pay | Admitting: Medical

## 2016-01-10 ENCOUNTER — Encounter: Payer: Self-pay | Admitting: Medical

## 2016-01-10 NOTE — Telephone Encounter (Addendum)
error:[id number] ° °

## 2016-01-25 MED FILL — ALPRAZolam 0.5 MG TABS: 0.5 | 30 days supply | Qty: 30 | Fill #1

## 2016-01-25 MED FILL — DICLOFENAC SOD EC 75 MG TAB: 75 | 30 days supply | Qty: 60 | Fill #1

## 2016-01-31 ENCOUNTER — Ambulatory Visit: Payer: Self-pay | Admitting: Medical

## 2016-02-01 ENCOUNTER — Encounter: Payer: Self-pay | Admitting: Medical

## 2016-02-01 ENCOUNTER — Ambulatory Visit (HOSPITAL_BASED_OUTPATIENT_CLINIC_OR_DEPARTMENT_OTHER)
Admission: RE | Admit: 2016-02-01 | Discharge: 2016-02-01 | Disposition: A | Discharge status: Home / Self Care | Payer: Medicare HMO | Source: Ambulatory Visit | Attending: Medical | Admitting: Medical

## 2016-02-01 ENCOUNTER — Telehealth: Payer: Self-pay | Admitting: Medical

## 2016-02-01 ENCOUNTER — Ambulatory Visit (INDEPENDENT_AMBULATORY_CARE_PROVIDER_SITE_OTHER): Payer: Medicare HMO | Admitting: Medical

## 2016-02-01 VITALS — BP 135/89 | HR 97 | Temp 97.8°F | Ht 62.0 in | Wt 197.6 lb

## 2016-02-01 DIAGNOSIS — G44309 Post-traumatic headache, unspecified, not intractable: Secondary | ICD-10-CM

## 2016-02-01 DIAGNOSIS — R1032 Left lower quadrant pain: Secondary | ICD-10-CM | POA: Diagnosis not present

## 2016-02-01 DIAGNOSIS — S0093XA Contusion of unspecified part of head, initial encounter: Secondary | ICD-10-CM | POA: Diagnosis not present

## 2016-02-01 DIAGNOSIS — R195 Other fecal abnormalities: Secondary | ICD-10-CM

## 2016-02-01 DIAGNOSIS — G319 Degenerative disease of nervous system, unspecified: Secondary | ICD-10-CM | POA: Diagnosis not present

## 2016-02-01 DIAGNOSIS — R42 Dizziness and giddiness: Secondary | ICD-10-CM

## 2016-02-01 DIAGNOSIS — I6782 Cerebral ischemia: Secondary | ICD-10-CM | POA: Insufficient documentation

## 2016-02-01 DIAGNOSIS — F32A Depression, unspecified: Secondary | ICD-10-CM

## 2016-02-01 DIAGNOSIS — F411 Generalized anxiety disorder: Secondary | ICD-10-CM

## 2016-02-01 DIAGNOSIS — F329 Major depressive disorder, single episode, unspecified: Secondary | ICD-10-CM

## 2016-02-01 LAB — COMPREHENSIVE METABOLIC PANEL
ALBUMIN: 4.3 g/dL (ref 3.5–5.2)
ALT: 9 U/L (ref 0–35)
AST: 14 U/L (ref 0–37)
Alkaline Phosphatase: 57 U/L (ref 39–117)
BUN: 20 mg/dL (ref 6–23)
CALCIUM: 9.1 mg/dL (ref 8.4–10.5)
CHLORIDE: 102 meq/L (ref 96–112)
CO2: 26 meq/L (ref 19–32)
Creatinine, Ser: 0.99 mg/dL (ref 0.40–1.20)
GFR: 59.4 mL/min — AB (ref >60.00)
Glucose, Bld: 106 mg/dL — ABNORMAL HIGH (ref 70–99)
POTASSIUM: 3.9 meq/L (ref 3.5–5.1)
Sodium: 140 mEq/L (ref 135–145)
Total Bilirubin: 0.5 mg/dL (ref 0.2–1.2)
Total Protein: 7.2 g/dL (ref 6.0–8.3)

## 2016-02-01 LAB — CBC WITH DIFFERENTIAL/PLATELET
BASOS PCT: 0.4 % (ref 0.0–3.0)
Basophils Absolute: 0 10*3/uL (ref 0.0–0.1)
EOS ABS: 0.1 10*3/uL (ref 0.0–0.7)
EOS PCT: 1 % (ref 0.0–5.0)
HCT: 44.4 % (ref 36.0–46.0)
HEMOGLOBIN: 15.5 g/dL — AB (ref 12.0–15.0)
LYMPHS PCT: 37.5 % (ref 12.0–46.0)
Lymphs Abs: 3.2 10*3/uL (ref 0.7–4.0)
MCHC: 34.9 g/dL (ref 30.0–36.0)
MCV: 92.2 fl (ref 78.0–100.0)
MONOS PCT: 7.5 % (ref 3.0–12.0)
Monocytes Absolute: 0.6 10*3/uL (ref 0.1–1.0)
NEUTROS ABS: 4.6 10*3/uL (ref 1.4–7.7)
Neutrophils Relative %: 53.6 % (ref 43.0–77.0)
PLATELETS: 244 10*3/uL (ref 150.0–400.0)
RBC: 4.82 Mil/uL (ref 3.87–5.11)
RDW: 13.1 % (ref 11.5–15.5)
WBC: 8.6 10*3/uL (ref 4.0–10.5)

## 2016-02-01 MED ORDER — CIPROFLOXACIN HCL 500 MG PO TABS: 500.0000 mg | ORAL_TABLET | Freq: Two times a day (BID) | ORAL | 0 refills | Status: DC

## 2016-02-01 MED ORDER — METRONIDAZOLE 500 MG PO TABS: 500.0000 mg | ORAL_TABLET | Freq: Three times a day (TID) | ORAL | 0 refills | Status: DC
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
## 2016-02-01 NOTE — Patient Instructions (Addendum)
For anxiety and depression. Will continue effexor, xanax and trazadone.  If you have any thoughts harming other or yourself then ED evaluation.  For head contusion one month ago with dizziness for 3 weeks and residual ha will put in CT of head order.   For abd pain left lower side with some loose stools will put in lab order and prescribe meds for possible diverticulitis.   Will refer to GI for screening colonoscopy. Follow up 7-10 days or as needed  Pt signed controlled med contract today and will give UDS.

## 2016-02-02 ENCOUNTER — Other Ambulatory Visit: Payer: Self-pay | Admitting: Medical

## 2016-02-06 ENCOUNTER — Other Ambulatory Visit: Payer: Self-pay | Admitting: Medical

## 2016-02-07 ENCOUNTER — Telehealth: Payer: Self-pay | Admitting: Medical

## 2016-02-07 DIAGNOSIS — K439 Ventral hernia without obstruction or gangrene: Secondary | ICD-10-CM

## 2016-02-07 DIAGNOSIS — R1013 Epigastric pain: Secondary | ICD-10-CM

## 2016-02-07 NOTE — Telephone Encounter (Signed)
[facility]  Patient Name: Sarah Barnes  DOB: 04-08-1951    Initial Comment caller states she vomits after taking abx   Nurse Assessment  Nurse: Harlow Mares, RN, Suanne Marker Date/Time (Eastern Time): 02/07/2016 5:58:08 PM  Confirm and document reason for call. If symptomatic, describe symptoms. You must click the next button to save text entered. ---caller states she vomits after taking abx for possible infection, GI related. Reports that she is not taking with food. Has been taking x 2 days and has been unable to keep them down due to vomiting. (Ciprofloxacin 500 mg and Metronidazole 500 mg). Denies fever. Feels nauseated since beginning.  Has the patient traveled out of the country within the last 30 days? ---Not Applicable  Does the patient have any new or worsening symptoms? ---Yes  Will a triage be completed? ---Yes  Related visit to physician within the last 2 weeks? ---Yes  Does the PT have any chronic conditions? (i.e. diabetes, asthma, etc.) ---Yes  List chronic conditions. ---HTN, high chol  Is this a behavioral health or substance abuse call? ---No     Guidelines    Guideline Title Affirmed Question Affirmed Notes  Vomiting [1] MILD or MODERATE vomiting AND [2] present > 48 hours (2 days) (Exception: mild vomiting with associated diarrhea)    Final Disposition User   See Physician within 24 Hours Harlow Mares, RN, [employer]    Referrals  REFERRED TO PCP OFFICE   Disagree/Comply: Leta Baptist

## 2016-02-07 NOTE — Telephone Encounter (Signed)
Pt called back in to speak with a nurse. (after 5). No one is available to advise. Transferred pt to [facility] for further assistance.

## 2016-02-07 NOTE — Telephone Encounter (Signed)
Relation to [id number] Call back number:[phone] Pharmacy:  Reason for call:  Patient states she's vomiting due to taken ciprofloxacin (CIPRO) 500 MG tablet or metroNIDAZOLE (FLAGYL) 500 MG tablet

## 2016-02-08 ENCOUNTER — Ambulatory Visit: Payer: Self-pay | Admitting: Medical

## 2016-02-08 NOTE — Telephone Encounter (Signed)
Patient reported no longer having vomiting, but now has diarrhea that started earlier this morning. She stated that, "it's been a total of 4 small, loose stools". Patient voiced that she's able to keep food on her stomach and staying well hydrated, drinking 20 oz. bottles of water throughout the day. Today the patient was scheduled for an appointment with PCP at 9:00 AM. Patient verbalized that she did not feel well enough to drive and did not have anyone else to bring her to the visit. Offered patient another appointment, but she declined, stating that she "will see how things are in the next few days". Advised patient that if her symptoms worsen or does not improve, call the office or seek the nearest ED or Urgent Care; also continue to maintain hydration, some food intake (e.g. potatoes, rice, bananas, etc.) and take OTC Imodium (as directed) to help relieve symptoms. She verbalized understanding and did not have any further questions or concerns prior to call ending.

## 2016-02-13 ENCOUNTER — Encounter: Payer: Self-pay | Admitting: Medical

## 2016-02-14 NOTE — Telephone Encounter (Signed)
error:[id number] ° °

## 2016-02-15 ENCOUNTER — Telehealth: Payer: Self-pay

## 2016-02-15 ENCOUNTER — Encounter: Payer: Self-pay | Admitting: Medical

## 2016-02-15 NOTE — Progress Notes (Unsigned)
Patients sister in office today to paick up patients Rx for Xanax. Per [employer], patient must be seen before she can pick up medication since she has no Xanax in  Her sysyem per lab result.
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
## 2016-02-15 NOTE — Telephone Encounter (Signed)
Pts sister came in office stating RX had not been sent downstairs to pharmacy. Percell Miller reviewed and stated that since pt sister said she is taking 2/day and pt states Percell Miller told her she could, that he needs to see her in office for 30 min OV to review meds and next steps. Gerald Stabs will call back after she talks to patient and schedule appt.  30 minute OV please.

## 2016-02-16 NOTE — Telephone Encounter (Signed)
Patient scheduld for Monday at 9:15 am.

## 2016-02-20 ENCOUNTER — Ambulatory Visit: Payer: Self-pay | Admitting: Medical

## 2016-02-20 DIAGNOSIS — Z0289 Encounter for other administrative examinations: Secondary | ICD-10-CM

## 2016-02-21 ENCOUNTER — Ambulatory Visit: Payer: Self-pay | Admitting: Medical

## 2016-02-22 ENCOUNTER — Encounter: Payer: Self-pay | Admitting: Medical

## 2016-02-23 ENCOUNTER — Other Ambulatory Visit: Payer: Self-pay | Admitting: Medical

## 2016-02-23 ENCOUNTER — Ambulatory Visit (INDEPENDENT_AMBULATORY_CARE_PROVIDER_SITE_OTHER): Payer: Medicare HMO | Admitting: Medical

## 2016-02-23 ENCOUNTER — Encounter: Payer: Self-pay | Admitting: Medical

## 2016-02-23 VITALS — BP 134/80 | HR 107 | Temp 98.0°F | Ht 62.0 in | Wt 195.8 lb

## 2016-02-23 DIAGNOSIS — Z23 Encounter for immunization: Secondary | ICD-10-CM | POA: Diagnosis not present

## 2016-02-23 DIAGNOSIS — R519 Headache, unspecified: Secondary | ICD-10-CM

## 2016-02-23 DIAGNOSIS — F411 Generalized anxiety disorder: Secondary | ICD-10-CM | POA: Diagnosis not present

## 2016-02-23 DIAGNOSIS — R51 Headache: Secondary | ICD-10-CM

## 2016-02-23 DIAGNOSIS — R109 Unspecified abdominal pain: Secondary | ICD-10-CM | POA: Diagnosis not present

## 2016-02-23 DIAGNOSIS — S060X0D Concussion without loss of consciousness, subsequent encounter: Secondary | ICD-10-CM

## 2016-02-23 LAB — CBC WITH DIFFERENTIAL/PLATELET
BASOS ABS: 0 10*3/uL (ref 0.0–0.1)
Basophils Relative: 0.4 % (ref 0.0–3.0)
Eosinophils Absolute: 0.1 10*3/uL (ref 0.0–0.7)
Eosinophils Relative: 1 % (ref 0.0–5.0)
HEMATOCRIT: 45.9 % (ref 36.0–46.0)
HEMOGLOBIN: 15.9 g/dL — AB (ref 12.0–15.0)
LYMPHS PCT: 39.9 % (ref 12.0–46.0)
Lymphs Abs: 3.4 10*3/uL (ref 0.7–4.0)
MCHC: 34.7 g/dL (ref 30.0–36.0)
MCV: 92.6 fl (ref 78.0–100.0)
MONO ABS: 0.7 10*3/uL (ref 0.1–1.0)
Monocytes Relative: 8.6 % (ref 3.0–12.0)
Neutro Abs: 4.2 10*3/uL (ref 1.4–7.7)
Neutrophils Relative %: 50.1 % (ref 43.0–77.0)
Platelets: 287 10*3/uL (ref 150.0–400.0)
RBC: 4.95 Mil/uL (ref 3.87–5.11)
RDW: 13.4 % (ref 11.5–15.5)
WBC: 8.4 10*3/uL (ref 4.0–10.5)

## 2016-02-23 MED ORDER — KETOROLAC TROMETHAMINE 60 MG/2ML IM SOLN
60.0000 mg | Freq: Once | INTRAMUSCULAR | Status: AC
  Administered 2016-02-23: 60 mg via INTRAMUSCULAR

## 2016-02-23 MED ORDER — ALPRAZOLAM 0.5 MG PO TABS: 0.5000 mg | ORAL_TABLET | Freq: Two times a day (BID) | ORAL | 1 refills | Status: DC | PRN

## 2016-02-23 MED FILL — ALPRAZolam 0.5 MG TABS: 0.5 | 15 days supply | Qty: 30 | Fill #0

## 2016-02-23 MED FILL — DICLOFENAC SOD 75 MG TAB EC: 75 | 30 days supply | Qty: 60 | Fill #0

## 2016-02-23 NOTE — Progress Notes (Signed)
Pre visit review using our clinic tool,if applicable. No additional management support is needed unless otherwise documented below in the visit note.  
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
## 2016-02-23 NOTE — Patient Instructions (Signed)
For your anxiety I am refilling your xanax at twice a day with one refill. I talked with my supervising physician and she states you will need to see psychiatrist as I mentioned to you in past but you refused. She states after this next month we can't write you any further xanax.  For your ha with likely concussion in past we gave toradol 60  Mg im. Refer to neurologist. If you have worse ha with worse features as described then main ED since they could do mri if ct of head were initially negative.  For your likely hernia and abd pain cbc stat today. Then go down stairs to schedule your [place]. If abd changes as discussed then ED evaluation. As hernias can sometimes have severe complication.  I am giving you information with all psychiatrist again. Please call them today. Get the referral process started.  Flu vaccine today.  Follow up in one month or as needed. If referral delayed I could give you non controlled med option for anxiety.

## 2016-02-24 ENCOUNTER — Encounter (HOSPITAL_BASED_OUTPATIENT_CLINIC_OR_DEPARTMENT_OTHER): Payer: Self-pay

## 2016-02-24 ENCOUNTER — Ambulatory Visit (HOSPITAL_BASED_OUTPATIENT_CLINIC_OR_DEPARTMENT_OTHER)
Admission: RE | Admit: 2016-02-24 | Discharge: 2016-02-24 | Disposition: A | Discharge status: Home / Self Care | Payer: Medicare HMO | Source: Ambulatory Visit | Attending: Medical | Admitting: Medical

## 2016-02-24 DIAGNOSIS — R1013 Epigastric pain: Secondary | ICD-10-CM | POA: Diagnosis present

## 2016-02-24 DIAGNOSIS — R109 Unspecified abdominal pain: Secondary | ICD-10-CM

## 2016-02-24 DIAGNOSIS — K439 Ventral hernia without obstruction or gangrene: Secondary | ICD-10-CM | POA: Insufficient documentation

## 2016-02-24 NOTE — Telephone Encounter (Signed)
Change order to [place] limited since suspicous for hernia.

## 2016-02-24 NOTE — Telephone Encounter (Signed)
[facility] never called back. Situation has seemed to calm down recently. Discussed case with supervising physician.

## 2016-02-25 NOTE — Telephone Encounter (Signed)
General surgeon referral placed. Let me know what surgeon office states regarding CT abd. Do they want before appointment?

## 2016-02-27 NOTE — Telephone Encounter (Signed)
I talked to Raquel Sarna, triage nurse at [facility]. She stated it would be beneficial to complete CT Abdomen prior to pt coming into their office.

## 2016-02-28 NOTE — Telephone Encounter (Signed)
Will do!

## 2016-02-28 NOTE — Telephone Encounter (Signed)
I placed the ct abdomen order. Will you coordinate the study before surgeon appointment. Thanks.

## 2016-02-29 ENCOUNTER — Ambulatory Visit (HOSPITAL_BASED_OUTPATIENT_CLINIC_OR_DEPARTMENT_OTHER)
Admission: RE | Admit: 2016-02-29 | Discharge: 2016-02-29 | Disposition: A | Discharge status: Home / Self Care | Payer: Medicare HMO | Source: Ambulatory Visit | Attending: Medical | Admitting: Medical

## 2016-02-29 DIAGNOSIS — K439 Ventral hernia without obstruction or gangrene: Secondary | ICD-10-CM | POA: Diagnosis not present

## 2016-02-29 DIAGNOSIS — N289 Disorder of kidney and ureter, unspecified: Secondary | ICD-10-CM | POA: Diagnosis not present

## 2016-02-29 DIAGNOSIS — R1013 Epigastric pain: Secondary | ICD-10-CM | POA: Diagnosis present

## 2016-02-29 DIAGNOSIS — I700 Atherosclerosis of aorta: Secondary | ICD-10-CM | POA: Diagnosis not present

## 2016-02-29 DIAGNOSIS — Z9049 Acquired absence of other specified parts of digestive tract: Secondary | ICD-10-CM | POA: Diagnosis not present

## 2016-03-01 ENCOUNTER — Encounter: Payer: Self-pay | Admitting: Medical

## 2016-03-02 ENCOUNTER — Ambulatory Visit: Payer: Self-pay | Admitting: Medical

## 2016-03-06 MED FILL — ALPRAZolam 0.5 MG TABS: 0.5 | 15 days supply | Qty: 30 | Fill #1

## 2016-03-07 ENCOUNTER — Other Ambulatory Visit: Payer: Self-pay | Admitting: Medical

## 2016-03-09 ENCOUNTER — Other Ambulatory Visit: Payer: Self-pay

## 2016-03-09 MED ORDER — FENOFIBRATE 160 MG PO TABS: 160.0000 mg | ORAL_TABLET | Freq: Every day | ORAL | 0 refills | Status: DC

## 2016-04-02 ENCOUNTER — Other Ambulatory Visit: Payer: Self-pay | Admitting: Medical

## 2016-04-25 ENCOUNTER — Other Ambulatory Visit: Payer: Self-pay | Admitting: Medical

## 2016-05-15 ENCOUNTER — Telehealth: Payer: Self-pay | Admitting: Medical

## 2016-06-01 ENCOUNTER — Telehealth: Payer: Self-pay | Admitting: Medical

## 2016-06-04 ENCOUNTER — Other Ambulatory Visit: Payer: Self-pay | Admitting: Medical

## 2016-06-05 NOTE — Telephone Encounter (Signed)
Patient called stating that she is running out of this medication and would like to get it refilled soon. Please advise

## 2016-06-05 NOTE — Telephone Encounter (Signed)
Refill request for Trazodone 100mg  Last filled by MD on - 08/15/15, #90x3 Last AEX - 02/23/16 Next AEX - Patient was to F/U in one month; also patient was informed that she could not get further Anxiety med [Alprazolam] if she did not make appointment with Psych, as she had previously been informed and Refused. She should not be due for her Trazodone until March 2018. Patient has no f/u appointments scheduled. Please Advise on refills/SLS 01/16

## 2016-06-06 ENCOUNTER — Other Ambulatory Visit: Payer: Self-pay | Admitting: Medical

## 2016-06-06 NOTE — Telephone Encounter (Signed)
Looks like she has refill of trazadone for up to one year. So because of this won't refill. Or call her mail order for clarification in case she changes insurance or they did not send refill?  As you reviewed no xanax from our office.

## 2016-06-08 NOTE — Telephone Encounter (Signed)
Copy & Pasted from previous note RE: this matter/SLS 01/19: Conversation: Medication Refill  (Newest Message First)  June 05, 2016    5:14 PM  You routed this conversation to [employer], PA-C  Me  5:09 PM  Note    Refill request for Trazodone 100mg  Last filled by MD on - 08/15/15, #90x3 Last AEX - 02/23/16 Next AEX - Patient was to F/U in one month; also patient was informed that she could not get further Anxiety med [Alprazolam] if she did not make appointment with Psych, as she had previously been informed and Refused. She should not be due for her Trazodone until March 2018. Patient has no f/u appointments scheduled. Please Advise on refills/SLS 01/16       3:19 PM  Katie L Marcum routed this conversation to Me  Katie L Marcum    3:19 PM Note    Patient called stating that she is running out of this medication and would like to get it refilled soon. Please advise

## 2016-06-24 ENCOUNTER — Telehealth: Payer: Self-pay | Admitting: Medical

## 2016-06-25 NOTE — Telephone Encounter (Signed)
I sent pt rx trazadone to the pharmacy.

## 2016-06-26 NOTE — Telephone Encounter (Signed)
Patient notified

## 2016-06-26 NOTE — Telephone Encounter (Signed)
Pt returned call. Informed her that provider sent Rx to mail order [facility]) pt expressed understanding. She would like a call back if there is something more that need to be discussed.

## 2016-06-26 NOTE — Telephone Encounter (Signed)
Called patient. Left message for return call. 

## 2016-07-02 ENCOUNTER — Other Ambulatory Visit: Payer: Self-pay | Admitting: Medical

## 2016-07-18 ENCOUNTER — Other Ambulatory Visit: Payer: Self-pay | Admitting: Medical

## 2016-07-24 ENCOUNTER — Ambulatory Visit: Payer: Self-pay | Admitting: General Surgery

## 2016-07-24 DIAGNOSIS — K432 Incisional hernia without obstruction or gangrene: Secondary | ICD-10-CM | POA: Diagnosis not present

## 2016-07-25 NOTE — Telephone Encounter (Signed)
rx refill of pt fenofibrate today.

## 2016-07-26 ENCOUNTER — Telehealth: Payer: Self-pay | Admitting: Medical

## 2016-07-26 NOTE — Telephone Encounter (Signed)
Caller name:Zelimpe,Christine Relation to pt: sister Call back number:[phone]  Pharmacy:  Reason for call: patient requesting Dr. Note stating patient needs a therapeutic dog and would like letter as soon as possible for her apartment complex. Please advise

## 2016-07-26 NOTE — Telephone Encounter (Signed)
Patient scheduled appointment for 07/26/16 at 8:30 and would like PA to be aware she's waiting to hear from specialist regarding her hernia surgery.

## 2016-07-26 NOTE — Telephone Encounter (Signed)
Pt would need office visit with me. 30 minute appointment. I need to talk with her before deciding.

## 2016-07-27 ENCOUNTER — Ambulatory Visit: Payer: Self-pay | Admitting: Medical

## 2016-08-10 ENCOUNTER — Encounter (HOSPITAL_COMMUNITY)
Admission: RE | Admit: 2016-08-10 | Discharge: 2016-08-10 | Disposition: A | Discharge status: Home / Self Care | Payer: Medicare HMO | Source: Ambulatory Visit | Attending: General Surgery | Admitting: General Surgery

## 2016-08-10 ENCOUNTER — Telehealth: Payer: Self-pay | Admitting: General Surgery

## 2016-08-10 ENCOUNTER — Other Ambulatory Visit: Payer: Self-pay | Admitting: Medical

## 2016-08-10 ENCOUNTER — Encounter (HOSPITAL_COMMUNITY): Payer: Self-pay

## 2016-08-10 ENCOUNTER — Ambulatory Visit (HOSPITAL_COMMUNITY)
Admission: RE | Admit: 2016-08-10 | Discharge: 2016-08-10 | Disposition: A | Discharge status: Home / Self Care | Payer: Medicare HMO | Source: Ambulatory Visit | Attending: General Surgery | Admitting: General Surgery

## 2016-08-10 ENCOUNTER — Other Ambulatory Visit: Payer: Self-pay | Admitting: General Surgery

## 2016-08-10 DIAGNOSIS — I491 Atrial premature depolarization: Secondary | ICD-10-CM | POA: Diagnosis not present

## 2016-08-10 DIAGNOSIS — J449 Chronic obstructive pulmonary disease, unspecified: Secondary | ICD-10-CM

## 2016-08-10 DIAGNOSIS — Z01818 Encounter for other preprocedural examination: Secondary | ICD-10-CM | POA: Diagnosis not present

## 2016-08-10 DIAGNOSIS — R9431 Abnormal electrocardiogram [ECG] [EKG]: Secondary | ICD-10-CM | POA: Diagnosis not present

## 2016-08-10 HISTORY — DX: Chronic obstructive pulmonary disease, unspecified: J44.9

## 2016-08-10 HISTORY — DX: Gastro-esophageal reflux disease without esophagitis: K21.9

## 2016-08-10 HISTORY — DX: Headache, unspecified: R51.9

## 2016-08-10 HISTORY — DX: Major depressive disorder, single episode, unspecified: F32.9

## 2016-08-10 HISTORY — DX: Depression, unspecified: F32.A

## 2016-08-10 HISTORY — DX: Headache: R51

## 2016-08-10 HISTORY — DX: Cardiac arrhythmia, unspecified: I49.9

## 2016-08-10 HISTORY — DX: Other specified postprocedural states: R11.2

## 2016-08-10 HISTORY — DX: Inflammatory liver disease, unspecified: K75.9

## 2016-08-10 HISTORY — DX: Other specified postprocedural states: Z98.890

## 2016-08-10 HISTORY — DX: Pneumonia, unspecified organism: J18.9

## 2016-08-10 HISTORY — DX: Dyspnea, unspecified: R06.00

## 2016-08-10 LAB — CBC WITH DIFFERENTIAL/PLATELET
BASOS ABS: 0 10*3/uL (ref 0.0–0.1)
Basophils Relative: 0 %
Eosinophils Absolute: 0 10*3/uL (ref 0.0–0.7)
Eosinophils Relative: 1 %
HEMATOCRIT: 42.9 % (ref 36.0–46.0)
Hemoglobin: 15.3 g/dL — ABNORMAL HIGH (ref 12.0–15.0)
Lymphocytes Relative: 39 %
Lymphs Abs: 3.1 10*3/uL (ref 0.7–4.0)
MCH: 32.5 pg (ref 26.0–34.0)
MCHC: 35.7 g/dL (ref 30.0–36.0)
MCV: 91.1 fL (ref 78.0–100.0)
MONO ABS: 0.8 10*3/uL (ref 0.1–1.0)
Monocytes Relative: 10 %
NEUTROS ABS: 4 10*3/uL (ref 1.7–7.7)
NEUTROS PCT: 50 %
Platelets: 227 10*3/uL (ref 150–400)
RBC: 4.71 MIL/uL (ref 3.87–5.11)
RDW: 12.9 % (ref 11.5–15.5)
WBC: 7.9 10*3/uL (ref 4.0–10.5)

## 2016-08-10 LAB — COMPREHENSIVE METABOLIC PANEL
ALBUMIN: 3.9 g/dL (ref 3.5–5.0)
ALT: 17 U/L (ref 14–54)
AST: 26 U/L (ref 15–41)
Alkaline Phosphatase: 61 U/L (ref 38–126)
Anion gap: 13 (ref 5–15)
BILIRUBIN TOTAL: 1.1 mg/dL (ref 0.3–1.2)
BUN: 8 mg/dL (ref 6–20)
CHLORIDE: 101 mmol/L (ref 101–111)
CO2: 26 mmol/L (ref 22–32)
Calcium: 9 mg/dL (ref 8.9–10.3)
Creatinine, Ser: 0.77 mg/dL (ref 0.44–1.00)
GFR calc Af Amer: >60 mL/min (ref >60)
GFR calc non Af Amer: >60 mL/min (ref >60)
GLUCOSE: 98 mg/dL (ref 65–99)
POTASSIUM: 2.6 mmol/L — AB (ref 3.5–5.1)
Sodium: 140 mmol/L (ref 135–145)
TOTAL PROTEIN: 6.7 g/dL (ref 6.5–8.1)

## 2016-08-10 NOTE — Progress Notes (Signed)
Lab called with panic potassium level..2.6 office called and gave information to wendy. She will relay to dr wyatt

## 2016-08-10 NOTE — Pre-Procedure Instructions (Addendum)
LEYDY WORTHEY  08/10/2016      [place], [place] [address] [place] [address] Phone: [phone] Fax: [phone]  [place], [place] [address] [place] [address] Phone: [phone] Fax: [facility] [place], [place]  [place] [address] Phone: [phone] Fax: [phone]    Your procedure is scheduled on 08/13/16  Report to [facility] at 730 A.M.  Call this number if you have problems the morning of surgery:  [phone]   Remember:  Do not eat food or drink liquids after midnight.  Take these medicines the morning of surgery with A SIP OF WATER    Inhaler if needed(bring),pantoprazole(protonix),venlafaxine(effexor) Prazosin  STOP all herbel meds, nsaids (aleve,naproxen,advil,ibuprofen)   Starting NOW including all vitamins/supplements,aspirin   Do not wear jewelry, make-up or nail polish.  Do not wear lotions, powders, or perfumes, or deoderant.  Do not shave 48 hours prior to surgery.  Men may shave face and neck.  Do not bring valuables to the hospital.  [facility] is not responsible for any belongings or valuables.  Contacts, dentures or bridgework may not be worn into surgery.  Leave your suitcase in the car.  After surgery it may be brought to your room.  For patients admitted to the hospital, discharge time will be determined by your treatment team.  Patients discharged the day of surgery will not be allowed to drive home.   Special instructions:   Special Instructions:  - Preparing for Surgery  Before surgery, you can play an important role.  Because skin is not sterile, your skin needs to be as free of germs as possible.  You can reduce the number of germs on you skin by washing with CHG  (chlorahexidine gluconate) soap before surgery.  CHG is an antiseptic cleaner which kills germs and bonds with the skin to continue killing germs even after washing.  Please DO NOT use if you have an allergy to CHG or antibacterial soaps.  If your skin becomes reddened/irritated stop using the CHG and inform your nurse when you arrive at Short Stay.  Do not shave (including legs and underarms) for at least 48 hours prior to the first CHG shower.  You may shave your face.  Please follow these instructions carefully:   1.  Shower with CHG Soap the night before surgery and the morning of Surgery.  2.  If you choose to wash your hair, wash your hair first as usual with your normal shampoo.  3.  After you shampoo, rinse your hair and body thoroughly to remove the Shampoo.  4.  Use CHG as you would any other liquid soap.  You can apply chg directly  to the skin and wash gently with scrungie or a clean washcloth.  5.  Apply the CHG Soap to your body ONLY FROM THE NECK DOWN.  Do not use on open wounds or open sores.  Avoid contact with your eyes ears, mouth and genitals (private parts).  Wash genitals (private parts)       with your normal soap.  6.  Wash thoroughly, paying special attention to the area where your surgery will be performed.  7.  Thoroughly rinse your body with warm water from the neck down.  8.  DO NOT shower/wash with your normal soap after using and rinsing off the CHG Soap.  9.  Pat yourself dry with a clean towel.            10.  Wear clean pajamas.            11.  Place clean sheets on your bed the night of your first shower and do not sleep with pets.  Day of Surgery  Do not apply any lotions/deodorants the morning of surgery.  Please wear clean clothes to the hospital/surgery center.  Please read over the  fact sheets that you were given.

## 2016-08-12 MED ORDER — CHLORHEXIDINE GLUCONATE CLOTH 2 % EX PADS: 6.0000 | MEDICATED_PAD | Freq: Once | CUTANEOUS | Status: DC

## 2016-08-12 MED ORDER — GABAPENTIN 300 MG PO CAPS
300.0000 mg | ORAL_CAPSULE | ORAL | Status: AC
  Administered 2016-08-13: 300 mg via ORAL
  Filled 2016-08-12: qty 1

## 2016-08-12 MED ORDER — ACETAMINOPHEN 500 MG PO TABS
1000.0000 mg | ORAL_TABLET | ORAL | Status: AC
  Administered 2016-08-13: 1000 mg via ORAL
  Filled 2016-08-12: qty 2

## 2016-08-12 MED ORDER — CEFAZOLIN SODIUM-DEXTROSE 2-4 GM/100ML-% IV SOLN
2.0000 g | INTRAVENOUS | Status: AC
  Administered 2016-08-13: 2 g via INTRAVENOUS
  Filled 2016-08-12: qty 100

## 2016-08-12 NOTE — H&P (Signed)
KHYA HALLS 07/24/2016 11:00 AM Location: [facility] Patient #: [id number] DOB: 1951-04-25 Undefined / Language: Cleophus Molt / Race: White Female   The patient is a 66 year old female.  Reducible epigastric hernia at previous laparoscopic port site.  Past Surgical History Nance Pear, [place]; 07/24/2016 11:00 AM) Anal Fissure Repair  Gallbladder Surgery - Laparoscopic  Knee Surgery  Bilateral. Oral Surgery  Shoulder Surgery  Right. Spinal Surgery - Lower Back   Diagnostic Studies History Nance Pear, [place]; 07/24/2016 11:00 AM) Colonoscopy  never  Allergies Bary Castilla [place], CMA; 07/24/2016 11:01 AM) No Known Drug Allergies 07/24/2016 Allergies Reconciled   Medication History Nance Pear, CMA; 07/24/2016 11:05 AM) Albuterol Sulfate HFA (108 (90 Base)MCG/ACT Aerosol Soln, Inhalation daily) Active. Xanax (0.5MG  Tablet, Oral daily) Active. Voltaren (75MG  Tablet DR, Oral daily) Active. Fenofibrate (160MG  Tablet, Oral daily) Active. Flonase (50MCG/ACT Suspension, Nasal daily) Active. Lasix (40MG  Tablet, Oral daily) Active. Nicotine (21MG /24HR Patch 24HR, Transdermal daily) Active. Protonix (40MG  Tablet DR, Oral daily) Active. Potassium Chloride (20MEQ Tablet ER, Oral daily) Active. Minipress (2MG  Capsule, Oral daily) Active. Crestor (40MG  Tablet, Oral daily) Active. TraZODone HCl (100MG  Tablet, Oral daily) Active. Effexor XR (150MG  Capsule ER 24HR, Oral daily) Active. Medications Reconciled  Social History Nance Pear, [place]; 07/24/2016 11:00 AM) Alcohol use  Moderate alcohol use. Caffeine use  Coffee. Illicit drug use  Prefer to discuss with provider. Tobacco use  Current every day smoker.  Family History Nance Pear, [place]; 07/24/2016 11:00 AM) Alcohol Abuse  Father. Anesthetic complications  Family Members In General. Arthritis  Mother. Bleeding disorder  Family Members In General. Breast Cancer  Family Members In General. Cancer   Family Members In General. Cerebrovascular Accident  Father. Cervical Cancer  Family Members In General. Colon Cancer  Family Members In General. Colon Polyps  Family Members In General. Depression  Mother. Diabetes Mellitus  Mother. Heart Disease  Mother. Heart disease in female family member before age 60  Hypertension  Mother. Ischemic Bowel Disease  Family Members In General. Kidney Disease  Family Members In General. Malignant Neoplasm Of Pancreas  Brother. Melanoma  Family Members In General. Migraine Headache  Family Members In General. Ovarian Cancer  Family Members In General. Prostate Cancer  Family Members In General. Rectal Cancer  Family Members In General. Respiratory Condition  Father. Seizure disorder  Family Members In General. Thyroid problems  Family Members In General.  Other Problems Nance Pear, [place]; 07/24/2016 11:00 AM) Anxiety Disorder  Arthritis  Back Pain  Chronic Obstructive Lung Disease  Depression  Gastroesophageal Reflux Disease  General anesthesia - complications  Hepatitis  High blood pressure  Hypercholesterolemia     Review of Systems Nance Pear CMA; 07/24/2016 11:00 AM) General Present- Fatigue, Night Sweats and Weight Gain. Not Present- Appetite Loss, Chills, Fever and Weight Loss. Skin Present- Dryness. Not Present- Change in Wart/Mole, Hives, Jaundice, New Lesions, Non-Healing Wounds, Rash and Ulcer. HEENT Present- Earache, Hearing Loss and Wears glasses/contact lenses. Not Present- Hoarseness, Nose Bleed, Oral Ulcers, Ringing in the Ears, Seasonal Allergies, Sinus Pain, Sore Throat, Visual Disturbances and Yellow Eyes. Respiratory Not Present- Bloody sputum, Chronic Cough, Difficulty Breathing, Snoring and Wheezing. Breast Not Present- Breast Mass, Breast Pain, Nipple Discharge and Skin Changes. Cardiovascular Present- Leg Cramps, Palpitations and Rapid Heart Rate. Not Present- Chest Pain, Difficulty  Breathing Lying Down, Shortness of Breath and Swelling of Extremities. Gastrointestinal Present- Bloating and Excessive gas. Not Present- Abdominal Pain, Bloody Stool, Change in Bowel Habits, Chronic diarrhea, Constipation, Difficulty Swallowing, Gets  full quickly at meals, Hemorrhoids, Indigestion, Nausea, Rectal Pain and Vomiting. Female Genitourinary Not Present- Blood in Urine, Change in Urinary Stream, Frequency, Impotence, Nocturia, Painful Urination, Urgency and Urine Leakage. Musculoskeletal Present- Back Pain and Joint Pain. Not Present- Joint Stiffness, Muscle Pain, Muscle Weakness and Swelling of Extremities. Neurological Present- Decreased Memory, Headaches, Tremor and Trouble walking. Not Present- Fainting, Numbness, Seizures, Tingling and Weakness. Psychiatric Present- Anxiety, Depression and Frequent crying. Not Present- Bipolar, Change in Sleep Pattern and Fearful. Endocrine Present- Hair Changes. Not Present- Cold Intolerance, Excessive Hunger, Heat Intolerance, Hot flashes and New Diabetes. Hematology Not Present- Blood Thinners, Easy Bruising, Excessive bleeding, Gland problems, HIV and Persistent Infections.  Vitals Bary Castilla [place] CMA; 07/24/2016 11:05 AM) 07/24/2016 11:05 AM Weight: 198.6 lb Height: 63in Body Surface Area: 1.93 m Body Mass Index: 35.18 kg/m  Temp.: 98.48F  Pulse: 93 (Regular)  BP: 148/92 (Sitting, Left Arm, Standard) BP current:  165/77 P current:  70  Physical Exam (Tzvi Economou O. Hulen Skains MD; 07/24/2016 11:40 AM) General Mental Status-Alert. General Appearance-Well groomed and Consistent with stated age. Orientation-Oriented X4.  Chest and Lung Exam Chest and lung exam reveals -normal excursion with symmetric chest walls, non-tender and normal tactile fremitus and on auscultation, normal breath sounds, no adventitious sounds and normal vocal resonance(Patient is a big time smoker, probably a pack per day, but she has no wheezing or  rhonchi.). COPD body habitus  Cardiovascular Cardiovascular examination reveals -normal heart sounds, regular rate and rhythm with no murmurs.  Abdomen Inspection Hernias - Ventral - Reducible(At previous laparoscopic cholecystectomy site in the subxiphoid area.).    Assessment & Plan Jeneen Rinks O. Joshwa Hemric MD; 07/24/2016 11:45 AM) Fatima Blank HERNIA, WITHOUT OBSTRUCTION OR GANGRENE (K43.2) Story: Previous laparoscopic cholecystectomy 15 years ago in [place]. Now has hernia at the subxiphoid site that is symptomatic. Impression: Easily reducible Subxiphoid ventral hernia. Should be amenable to laparoscopic repair. Current Plans:  Laparoscopic, possible open ventral hernia repair with mesh  Kathryne Eriksson. Dahlia Bailiff, MD, [place] [phone] [facility]

## 2016-08-13 ENCOUNTER — Encounter (HOSPITAL_COMMUNITY): Payer: Self-pay | Admitting: Certified Registered Nurse Anesthetist

## 2016-08-13 ENCOUNTER — Encounter (HOSPITAL_COMMUNITY)
Admission: RE | Disposition: A | Discharge status: Home / Self Care | Payer: Self-pay | Source: Ambulatory Visit | Attending: General Surgery

## 2016-08-13 ENCOUNTER — Ambulatory Visit (HOSPITAL_COMMUNITY): Payer: Medicare HMO | Admitting: Anesthesiology

## 2016-08-13 ENCOUNTER — Ambulatory Visit (HOSPITAL_COMMUNITY)
Admission: RE | Admit: 2016-08-13 | Discharge: 2016-08-14 | Disposition: A | Discharge status: Home / Self Care | Payer: Medicare HMO | Source: Ambulatory Visit | Attending: General Surgery | Admitting: General Surgery

## 2016-08-13 DIAGNOSIS — J9601 Acute respiratory failure with hypoxia: Secondary | ICD-10-CM | POA: Diagnosis not present

## 2016-08-13 DIAGNOSIS — Z7951 Long term (current) use of inhaled steroids: Secondary | ICD-10-CM | POA: Diagnosis not present

## 2016-08-13 DIAGNOSIS — Z791 Long term (current) use of non-steroidal anti-inflammatories (NSAID): Secondary | ICD-10-CM | POA: Diagnosis not present

## 2016-08-13 DIAGNOSIS — F418 Other specified anxiety disorders: Secondary | ICD-10-CM | POA: Diagnosis not present

## 2016-08-13 DIAGNOSIS — E7800 Pure hypercholesterolemia, unspecified: Secondary | ICD-10-CM | POA: Insufficient documentation

## 2016-08-13 DIAGNOSIS — K219 Gastro-esophageal reflux disease without esophagitis: Secondary | ICD-10-CM | POA: Diagnosis not present

## 2016-08-13 DIAGNOSIS — J449 Chronic obstructive pulmonary disease, unspecified: Secondary | ICD-10-CM | POA: Insufficient documentation

## 2016-08-13 DIAGNOSIS — F419 Anxiety disorder, unspecified: Secondary | ICD-10-CM | POA: Diagnosis not present

## 2016-08-13 DIAGNOSIS — F17210 Nicotine dependence, cigarettes, uncomplicated: Secondary | ICD-10-CM | POA: Diagnosis not present

## 2016-08-13 DIAGNOSIS — I10 Essential (primary) hypertension: Secondary | ICD-10-CM | POA: Insufficient documentation

## 2016-08-13 DIAGNOSIS — Z79899 Other long term (current) drug therapy: Secondary | ICD-10-CM | POA: Diagnosis not present

## 2016-08-13 DIAGNOSIS — K432 Incisional hernia without obstruction or gangrene: Secondary | ICD-10-CM | POA: Diagnosis present

## 2016-08-13 DIAGNOSIS — K439 Ventral hernia without obstruction or gangrene: Secondary | ICD-10-CM | POA: Diagnosis present

## 2016-08-13 DIAGNOSIS — F329 Major depressive disorder, single episode, unspecified: Secondary | ICD-10-CM | POA: Insufficient documentation

## 2016-08-13 DIAGNOSIS — K430 Incisional hernia with obstruction, without gangrene: Secondary | ICD-10-CM | POA: Insufficient documentation

## 2016-08-13 HISTORY — PX: VENTRAL HERNIA REPAIR: SHX424

## 2016-08-13 HISTORY — DX: Unspecified chronic bronchitis: J42

## 2016-08-13 HISTORY — DX: Anemia, unspecified: D64.9

## 2016-08-13 HISTORY — DX: Unspecified osteoarthritis, unspecified site: M19.90

## 2016-08-13 HISTORY — PX: LAPAROSCOPIC INCISIONAL / UMBILICAL / VENTRAL HERNIA REPAIR: SUR789

## 2016-08-13 LAB — POCT I-STAT 4, (NA,K, GLUC, HGB,HCT)
GLUCOSE: 107 mg/dL — AB (ref 65–99)
HCT: 38 % (ref 36.0–46.0)
HEMOGLOBIN: 12.9 g/dL (ref 12.0–15.0)
POTASSIUM: 3.8 mmol/L (ref 3.5–5.1)
Sodium: 142 mmol/L (ref 135–145)

## 2016-08-13 SURGERY — REPAIR, HERNIA, VENTRAL, LAPAROSCOPIC
Anesthesia: General | Site: Abdomen

## 2016-08-13 MED ORDER — FLUTICASONE PROPIONATE 50 MCG/ACT NA SUSP
2.0000 | Freq: Every day | NASAL | Status: DC | PRN
  Filled 2016-08-13: qty 16

## 2016-08-13 MED ORDER — IPRATROPIUM-ALBUTEROL 0.5-2.5 (3) MG/3ML IN SOLN
3.0000 mL | Freq: Three times a day (TID) | RESPIRATORY_TRACT | Status: DC
  Filled 2016-08-13: qty 3

## 2016-08-13 MED ORDER — PHENYLEPHRINE 40 MCG/ML (10ML) SYRINGE FOR IV PUSH (FOR BLOOD PRESSURE SUPPORT)
PREFILLED_SYRINGE | INTRAVENOUS | Status: AC
  Filled 2016-08-13: qty 10

## 2016-08-13 MED ORDER — SODIUM CHLORIDE 0.9 % IR SOLN
Status: DC | PRN
  Administered 2016-08-13: 1000 mL

## 2016-08-13 MED ORDER — SODIUM CHLORIDE 0.9 % IR SOLN
Status: DC | PRN
  Administered 2016-08-13: 500 mL

## 2016-08-13 MED ORDER — ONDANSETRON HCL 4 MG/2ML IJ SOLN
INTRAMUSCULAR | Status: DC | PRN
  Administered 2016-08-13: 4 mg via INTRAVENOUS

## 2016-08-13 MED ORDER — BUPIVACAINE HCL (PF) 0.25 % IJ SOLN
INTRAMUSCULAR | Status: DC | PRN
  Administered 2016-08-13: 10 mL

## 2016-08-13 MED ORDER — OXYCODONE HCL 5 MG PO TABS
ORAL_TABLET | ORAL | Status: AC
  Filled 2016-08-13: qty 1

## 2016-08-13 MED ORDER — HYDROMORPHONE HCL 1 MG/ML IJ SOLN
INTRAMUSCULAR | Status: AC
  Filled 2016-08-13: qty 0.5

## 2016-08-13 MED ORDER — LIDOCAINE 2% (20 MG/ML) 5 ML SYRINGE
INTRAMUSCULAR | Status: DC | PRN
  Administered 2016-08-13: 60 mg via INTRAVENOUS

## 2016-08-13 MED ORDER — IPRATROPIUM-ALBUTEROL 0.5-2.5 (3) MG/3ML IN SOLN: 3.0000 mL | Freq: Four times a day (QID) | RESPIRATORY_TRACT | Status: DC

## 2016-08-13 MED ORDER — EPHEDRINE SULFATE-NACL 50-0.9 MG/10ML-% IV SOSY
PREFILLED_SYRINGE | INTRAVENOUS | Status: DC | PRN
  Administered 2016-08-13 (×2): 5 mg via INTRAVENOUS

## 2016-08-13 MED ORDER — STUDY - INVESTIGATIONAL DRUG SIMPLE RECORD
Status: AC
  Filled 2016-08-13: qty 0

## 2016-08-13 MED ORDER — 0.9 % SODIUM CHLORIDE (POUR BTL) OPTIME
TOPICAL | Status: DC | PRN
Start: 2016-08-13 — End: 2016-08-13
  Administered 2016-08-13: 1000 mL

## 2016-08-13 MED ORDER — HYDROMORPHONE HCL 1 MG/ML IJ SOLN
0.5000 mg | INTRAMUSCULAR | Status: DC | PRN
  Administered 2016-08-13: 1 mg via INTRAVENOUS
  Filled 2016-08-13 (×2): qty 1

## 2016-08-13 MED ORDER — FENTANYL CITRATE (PF) 100 MCG/2ML IJ SOLN
INTRAMUSCULAR | Status: AC
  Filled 2016-08-13: qty 2

## 2016-08-13 MED ORDER — SUGAMMADEX SODIUM 200 MG/2ML IV SOLN
INTRAVENOUS | Status: DC | PRN
  Administered 2016-08-13: 160 mg via INTRAVENOUS

## 2016-08-13 MED ORDER — ONDANSETRON HCL 4 MG/2ML IJ SOLN: 4.0000 mg | Freq: Four times a day (QID) | INTRAMUSCULAR | Status: DC | PRN

## 2016-08-13 MED ORDER — MIDAZOLAM HCL 2 MG/2ML IJ SOLN
INTRAMUSCULAR | Status: DC | PRN
  Administered 2016-08-13: 2 mg via INTRAVENOUS

## 2016-08-13 MED ORDER — HYDROMORPHONE HCL 1 MG/ML IJ SOLN
INTRAMUSCULAR | Status: AC
  Administered 2016-08-13: 0.5 mg via INTRAVENOUS
  Filled 2016-08-13: qty 0.5

## 2016-08-13 MED ORDER — OXYCODONE HCL 5 MG PO TABS
5.0000 mg | ORAL_TABLET | Freq: Once | ORAL | Status: AC
  Administered 2016-08-13: 5 mg via ORAL

## 2016-08-13 MED ORDER — HYDROMORPHONE HCL 1 MG/ML IJ SOLN
0.2500 mg | INTRAMUSCULAR | Status: DC | PRN
  Administered 2016-08-13 (×4): 0.5 mg via INTRAVENOUS

## 2016-08-13 MED ORDER — LIDOCAINE 2% (20 MG/ML) 5 ML SYRINGE
INTRAMUSCULAR | Status: AC
  Filled 2016-08-13: qty 5

## 2016-08-13 MED ORDER — SCOPOLAMINE 1 MG/3DAYS TD PT72
1.0000 | MEDICATED_PATCH | TRANSDERMAL | Status: DC
  Administered 2016-08-13: 1.5 mg via TRANSDERMAL
  Filled 2016-08-13: qty 1

## 2016-08-13 MED ORDER — DEXAMETHASONE SODIUM PHOSPHATE 10 MG/ML IJ SOLN
INTRAMUSCULAR | Status: DC | PRN
Start: 2016-08-13 — End: 2016-08-13
  Administered 2016-08-13: 10 mg via INTRAVENOUS

## 2016-08-13 MED ORDER — OXYCODONE HCL 5 MG PO TABS
ORAL_TABLET | ORAL | Status: AC
  Administered 2016-08-13: 5 mg via ORAL
  Filled 2016-08-13: qty 1

## 2016-08-13 MED ORDER — VENLAFAXINE HCL ER 75 MG PO CP24
150.0000 mg | ORAL_CAPSULE | Freq: Every day | ORAL | Status: DC
  Administered 2016-08-14: 150 mg via ORAL
  Filled 2016-08-13: qty 2

## 2016-08-13 MED ORDER — ENOXAPARIN SODIUM 40 MG/0.4ML ~~LOC~~ SOLN: 40.0000 mg | SUBCUTANEOUS | Status: DC

## 2016-08-13 MED ORDER — EPHEDRINE 5 MG/ML INJ
INTRAVENOUS | Status: AC
  Filled 2016-08-13: qty 10

## 2016-08-13 MED ORDER — FUROSEMIDE 40 MG PO TABS
40.0000 mg | ORAL_TABLET | Freq: Every day | ORAL | Status: DC
  Administered 2016-08-13: 40 mg via ORAL
  Filled 2016-08-13: qty 1

## 2016-08-13 MED ORDER — ROCURONIUM BROMIDE 50 MG/5ML IV SOSY
PREFILLED_SYRINGE | INTRAVENOUS | Status: AC
  Filled 2016-08-13: qty 5

## 2016-08-13 MED ORDER — PHENYLEPHRINE HCL 10 MG/ML IJ SOLN
INTRAMUSCULAR | Status: DC | PRN
Start: 2016-08-13 — End: 2016-08-13
  Administered 2016-08-13: 20 ug/min via INTRAVENOUS

## 2016-08-13 MED ORDER — PROPOFOL 10 MG/ML IV BOLUS
INTRAVENOUS | Status: AC
  Filled 2016-08-13: qty 20

## 2016-08-13 MED ORDER — PROPOFOL 10 MG/ML IV BOLUS
INTRAVENOUS | Status: DC | PRN
  Administered 2016-08-13: 120 mg via INTRAVENOUS

## 2016-08-13 MED ORDER — OXYCODONE HCL 5 MG PO TABS: 5.0000 mg | ORAL_TABLET | Freq: Four times a day (QID) | ORAL | 0 refills | Status: DC | PRN

## 2016-08-13 MED ORDER — STUDY - INVESTIGATIONAL DRUG SIMPLE RECORD: 1.0000 "application " | Status: DC

## 2016-08-13 MED ORDER — BUPIVACAINE HCL (PF) 0.25 % IJ SOLN
INTRAMUSCULAR | Status: AC
  Filled 2016-08-13: qty 30

## 2016-08-13 MED ORDER — OXYCODONE HCL 5 MG PO TABS
5.0000 mg | ORAL_TABLET | ORAL | Status: DC | PRN
Start: 2016-08-13 — End: 2016-08-14
  Administered 2016-08-13: 10 mg via ORAL
  Administered 2016-08-13: 5 mg via ORAL
  Administered 2016-08-14 (×2): 10 mg via ORAL
  Filled 2016-08-13 (×3): qty 2

## 2016-08-13 MED ORDER — ONDANSETRON 4 MG PO TBDP: 4.0000 mg | ORAL_TABLET | Freq: Four times a day (QID) | ORAL | Status: DC | PRN

## 2016-08-13 MED ORDER — PANTOPRAZOLE SODIUM 40 MG IV SOLR
40.0000 mg | Freq: Every day | INTRAVENOUS | Status: DC
  Administered 2016-08-13: 40 mg via INTRAVENOUS
  Filled 2016-08-13: qty 40

## 2016-08-13 MED ORDER — LACTATED RINGERS IV SOLN
INTRAVENOUS | Status: DC
  Administered 2016-08-13: 50 mL/h via INTRAVENOUS

## 2016-08-13 MED ORDER — PANTOPRAZOLE SODIUM 40 MG PO TBEC: 40.0000 mg | DELAYED_RELEASE_TABLET | Freq: Every day | ORAL | Status: DC

## 2016-08-13 MED ORDER — SCOPOLAMINE 1 MG/3DAYS TD PT72
MEDICATED_PATCH | TRANSDERMAL | Status: AC
  Filled 2016-08-13: qty 1

## 2016-08-13 MED ORDER — MIDAZOLAM HCL 2 MG/2ML IJ SOLN
INTRAMUSCULAR | Status: AC
  Filled 2016-08-13: qty 2

## 2016-08-13 MED ORDER — ROCURONIUM BROMIDE 10 MG/ML (PF) SYRINGE
PREFILLED_SYRINGE | INTRAVENOUS | Status: DC | PRN
  Administered 2016-08-13 (×2): 10 mg via INTRAVENOUS
  Administered 2016-08-13: 50 mg via INTRAVENOUS

## 2016-08-13 MED ORDER — TRAZODONE HCL 100 MG PO TABS
100.0000 mg | ORAL_TABLET | Freq: Every day | ORAL | Status: DC
  Administered 2016-08-13: 100 mg via ORAL
  Filled 2016-08-13: qty 1

## 2016-08-13 MED ORDER — FENTANYL CITRATE (PF) 100 MCG/2ML IJ SOLN
INTRAMUSCULAR | Status: DC | PRN
  Administered 2016-08-13: 100 ug via INTRAVENOUS
  Administered 2016-08-13: 50 ug via INTRAVENOUS

## 2016-08-13 MED ORDER — IBUPROFEN 400 MG PO TABS
400.0000 mg | ORAL_TABLET | Freq: Every day | ORAL | Status: DC
  Administered 2016-08-13: 400 mg via ORAL
  Filled 2016-08-13: qty 1

## 2016-08-13 MED ORDER — ALBUTEROL SULFATE (2.5 MG/3ML) 0.083% IN NEBU
2.5000 mg | INHALATION_SOLUTION | Freq: Four times a day (QID) | RESPIRATORY_TRACT | Status: DC | PRN
  Administered 2016-08-13: 2.5 mg via RESPIRATORY_TRACT
  Filled 2016-08-13: qty 3

## 2016-08-13 MED ORDER — PHENYLEPHRINE 40 MCG/ML (10ML) SYRINGE FOR IV PUSH (FOR BLOOD PRESSURE SUPPORT)
PREFILLED_SYRINGE | INTRAVENOUS | Status: DC | PRN
  Administered 2016-08-13: 80 ug via INTRAVENOUS

## 2016-08-13 SURGICAL SUPPLY — 38 items
BAG DECANTER FOR FLEXI CONT (MISCELLANEOUS) ×3 IMPLANT
BINDER ABDOMINAL 12 ML 46-62 (SOFTGOODS) ×3 IMPLANT
CHLORAPREP W/TINT 26ML (MISCELLANEOUS) ×3 IMPLANT
CLOSURE WOUND 1/2 X4 (GAUZE/BANDAGES/DRESSINGS) ×1
DECANTER SPIKE VIAL GLASS SM (MISCELLANEOUS) ×3 IMPLANT
DERMABOND ADVANCED (GAUZE/BANDAGES/DRESSINGS) ×2
DERMABOND ADVANCED .7 DNX12 (GAUZE/BANDAGES/DRESSINGS) ×1 IMPLANT
DEVICE SECURE STRAP 25 ABSORB (INSTRUMENTS) ×3 IMPLANT
DEVICE TROCAR PUNCTURE CLOSURE (ENDOMECHANICALS) ×3 IMPLANT
DRSG TEGADERM 2-3/8X2-3/4 SM (GAUZE/BANDAGES/DRESSINGS) ×3 IMPLANT
ELECT REM PT RETURN 9FT ADLT (ELECTROSURGICAL) ×3
ELECTRODE REM PT RTRN 9FT ADLT (ELECTROSURGICAL) ×1 IMPLANT
GLOVE BIO SURGEON STRL SZ7.5 (GLOVE) ×3 IMPLANT
GLOVE BIOGEL PI IND STRL 7.0 (GLOVE) ×1 IMPLANT
GLOVE BIOGEL PI IND STRL 8 (GLOVE) ×1 IMPLANT
GLOVE BIOGEL PI INDICATOR 7.0 (GLOVE) ×2
GLOVE BIOGEL PI INDICATOR 8 (GLOVE) ×2
GLOVE ECLIPSE 7.5 STRL STRAW (GLOVE) ×3 IMPLANT
GOWN STRL REUS W/ TWL LRG LVL3 (GOWN DISPOSABLE) ×3 IMPLANT
GOWN STRL REUS W/TWL LRG LVL3 (GOWN DISPOSABLE) ×6
KIT BASIN OR (CUSTOM PROCEDURE TRAY) ×3 IMPLANT
KIT ROOM TURNOVER OR (KITS) ×3 IMPLANT
MARKER SKIN DUAL TIP RULER LAB (MISCELLANEOUS) ×3 IMPLANT
MESH VENTRALIGHT ST 6IN CRC (Mesh General) ×3 IMPLANT
NEEDLE SPNL 22GX3.5 QUINCKE BK (NEEDLE) ×3 IMPLANT
NS IRRIG 1000ML POUR BTL (IV SOLUTION) ×3 IMPLANT
PAD ARMBOARD 7.5X6 YLW CONV (MISCELLANEOUS) ×3 IMPLANT
SCISSORS LAP 5X35 DISP (ENDOMECHANICALS) ×3 IMPLANT
SET IRRIG TUBING LAPAROSCOPIC (IRRIGATION / IRRIGATOR) ×3 IMPLANT
SHEARS HARMONIC ACE PLUS 36CM (ENDOMECHANICALS) ×3 IMPLANT
SLEEVE ENDOPATH XCEL 5M (ENDOMECHANICALS) ×3 IMPLANT
STRIP CLOSURE SKIN 1/2X4 (GAUZE/BANDAGES/DRESSINGS) ×2 IMPLANT
SUT MNCRL AB 4-0 PS2 18 (SUTURE) ×3 IMPLANT
SUT NOVA 0 T19/GS 22DT (SUTURE) ×3 IMPLANT
TOWEL OR 17X24 6PK STRL BLUE (TOWEL DISPOSABLE) ×3 IMPLANT
TRAY LAPAROSCOPIC MC (CUSTOM PROCEDURE TRAY) ×3 IMPLANT
TROCAR XCEL NON-BLD 11X100MML (ENDOMECHANICALS) ×3 IMPLANT
TUBING INSUFFLATION (TUBING) ×3 IMPLANT

## 2016-08-13 NOTE — Progress Notes (Signed)
Dr Ola Spurr aware of sats, no one at home, pt staying the night per him, awaiting Dr Hulen Skains for admit orders

## 2016-08-13 NOTE — Progress Notes (Signed)
Pt ambulated and sats 77 when back to chair

## 2016-08-13 NOTE — Transfer of Care (Signed)
Immediate Anesthesia Transfer of Care Note  Patient: Sarah Barnes  Procedure(s) Performed: Procedure(s): LAPAROSCOPIC VENTRAL HERNIA REPAIR WITH MESH (N/A)  Patient Location: PACU  Anesthesia Type:General  Level of Consciousness: awake, alert , oriented and patient cooperative  Airway & Oxygen Therapy: Patient Spontanous Breathing and Patient connected to nasal cannula oxygen  Post-op Assessment: Report given to RN, Post -op Vital signs reviewed and stable and Patient moving all extremities X 4  Post vital signs: Reviewed and stable  Last Vitals:  Vitals:   08/13/16 0741 08/13/16 1125  BP: (!) 165/77 134/82  Pulse: 70 68  Resp: 18 12  Temp: 36.6 C 36.3 C    Last Pain:  Vitals:   08/13/16 0848  TempSrc:   PainSc: 3       Patients Stated Pain Goal: 3 (81/77/11 6579)  Complications: No apparent anesthesia complications

## 2016-08-13 NOTE — Discharge Instructions (Signed)
Laparoscopic Ventral Hernia Repair, Care After This sheet gives you information about how to care for yourself after your procedure. Your health care provider may also give you more specific instructions. If you have problems or questions, contact your health care provider. What can I expect after the procedure? After the procedure, it is common to have:  Pain, discomfort, or soreness. Follow these instructions at home: Incision care   Follow instructions from your health care provider about how to take care of your incision. Make sure you:  Wash your hands with soap and water before you change your bandage (dressing) or before you touch your abdomen. If soap and water are not available, use hand sanitizer.  Change your dressing as told by your health care provider.  Leave stitches (sutures), skin glue, or adhesive strips in place. These skin closures may need to stay in place for 2 weeks or longer. If adhesive strip edges start to loosen and curl up, you may trim the loose edges. Do not remove adhesive strips completely unless your health care provider tells you to do that.  Check your incision area every day for signs of infection. Check for:  Redness, swelling, or pain.  Fluid or blood.  Warmth.  Pus or a bad smell. Bathing   Do not take baths, swim, or use a hot tub until your health care provider approves. You can take showers. You may only be allowed to take sponge baths for bathing.  Keep your bandage (dressing) dry until your health care provider says it can be removed. Activity   Do not lift anything that is heavier than 10 lb (4.5 kg) until your health care provider approves.  Do not drive or use heavy machinery while taking prescription pain medicine. Ask your health care provider when it is safe for you to drive or use heavy machinery.  Do not drive for 24 hours if you were given a medicine to help you relax (sedative) during your procedure.  Rest as told by your  health care provider. You may return to your normal activities when your health care provider approves.  Wear abdominal binder at all times when out of bed or not reclined. General instructions   Take over-the-counter and prescription medicines only as told by your health care provider.  To prevent or treat constipation while you are taking prescription pain medicine, your health care provider may recommend that you:  Take over-the-counter or prescription medicines.  Eat foods that are high in fiber, such as fresh fruits and vegetables, whole grains, and beans.  Limit foods that are high in fat and processed sugars, such as fried and sweet foods.  Drink enough fluid to keep your urine clear or pale yellow.  Hold a pillow over your abdomen when you cough or sneeze. This helps with pain.  Keep all follow-up visits as told by your health care provider. This is important. Contact a health care provider if:  You have:  A fever or chills.  Redness, swelling, or pain around your incision.  Fluid or blood coming from your incision.  Pus or a bad smell coming from your incision.  Pain that gets worse or does not get better with medicine.  Nausea or vomiting.  A cough.  Shortness of breath.  Your incision feels warm to the touch.  You have not had a bowel movement in three days.  You are not able to urinate. Get help right away if:  You have severe pain in your abdomen.  You have persistent nausea and vomiting.  You have redness, warmth, or pain in your leg.  You have chest pain.  You have trouble breathing. Summary  After this procedure, it is common to have pain, discomfort, or soreness.  Follow instructions from your health care provider about how to take care of your incision.  Check your incision area every day for signs of infection. Report any signs of infection to your health care provider.  Keep all follow-up visits as told by your health care provider.  This is important. This information is not intended to replace advice given to you by your health care provider. Make sure you discuss any questions you have with your health care provider.

## 2016-08-13 NOTE — Op Note (Signed)
OPERATIVE REPORT  DATE OF OPERATION: 08/13/2016  PATIENT:  Sarah Barnes  66 y.o. female  PRE-OPERATIVE DIAGNOSIS:  Ventral Incisional Hernia  POST-OPERATIVE DIAGNOSIS:  Ventral Incisional Hernia., 3 cm diameter  INDICATION(S) FOR OPERATION:  Symptomatic ventral hernia  FINDINGS:  Small amount of omentum incarcerated initially in the ventral hernia,   PROCEDURE:  Procedure(s): LAPAROSCOPIC VENTRAL HERNIA REPAIR WITH MESH  SURGEON:  Surgeon(s): Judeth Horn, MD  ASSISTANT: None  ANESTHESIA:   general  COMPLICATIONS:  None  EBL: <10 ml  BLOOD ADMINISTERED: none  DRAINS: none   SPECIMEN:  No Specimen  COUNTS CORRECT:  YES  PROCEDURE DETAILS: The patient was taken to the operating room and placed on the table in the supine position. After an adequate general endotracheal anesthetic was administered, she was prepped and draped in the usual sterile manner exposing her abdomen.  Upper timeout was performed identifying the patient and the procedure to be performed. An 11 mm Optiview trocar and cannula was passed through an incision made in the left upper quadrant and easily passed to the peritoneal cavity without difficulty. We then used at insufflate carbon dioxide gas up to a maximal intra-abdominal pressure of 15 mmHg.  A left low quadrant 5 mm cannula pass under direct vision. We then placed the patient in reverse Trendelenburg and began the procedure.  There was a tongue of omentum stuck in the 3 cm hernia defect which we easily were able to remove. We then used a Harmonic Scalpel to dissect away the falciform ligament from the edge of the hernia. Once this was done we measured the edges of the hernia defect, and then measure 5 cm away from the edges coming out with a 13 x 13.5 cm area needed for the mesh.  We chose a circular 15.2 cm VentraLite coated Bard mesh and cut it to approximately 14 cm by taking off the centimeters from the edge circumferentially.  We soaked  the mesh in antibiotic solution then on the fascial side of the mesh we placed 4 equally spaced 0 Novafil sutures in the mesh. Once this was done we passed the mesh along with the sutures attached into the peritoneal cavity then brought out the tacking sutures to the abdominal wall through the sites measured on the abdominal wall. This is down with a suture retriever.  Each of the 4 sutures were tied down and then we used a secure strap tacker in order to completely fix the mesh. Once this was done it was in excellent position. We allowed all gas to escape the peritoneal cavity through the cannula and the cannula site.  All cannulas were removed. 0.25% bupivacaine was injected at all sites. We closed the 11 mm skin site using running subcuticular stitch of 4-0 Monocryl. Dermabond, Steri-Strips, and Tegaderms views complete our dressings.  PATIENT DISPOSITION:  PACU - hemodynamically stable.   Jeremey Bascom 3/26/201811:19 AM

## 2016-08-13 NOTE — Anesthesia Procedure Notes (Signed)
Procedure Name: Intubation Date/Time: 08/13/2016 10:16 AM Performed by: Julieta Bellini Pre-anesthesia Checklist: Patient identified, Emergency Drugs available, Suction available and Patient being monitored Patient Re-evaluated:Patient Re-evaluated prior to inductionOxygen Delivery Method: Circle system utilized Preoxygenation: Pre-oxygenation with 100% oxygen Intubation Type: IV induction Ventilation: Mask ventilation without difficulty and Oral airway inserted - appropriate to patient size Laryngoscope Size: Sabra Heck and 2 Grade View: Grade III Tube type: Oral Tube size: 7.0 mm Number of attempts: 2 Airway Equipment and Method: Stylet Placement Confirmation: ETT inserted through vocal cords under direct vision,  positive ETCO2 and breath sounds checked- equal and bilateral Secured at: 22 cm Tube secured with: Tape Dental Injury: Teeth and Oropharynx as per pre-operative assessment  Difficulty Due To: Difficulty was unanticipated Comments: DL x1 by CRNA, Grade 3 view with both Mac 3 and Miller 2, attempted intubation. Unsuccessful placed in stomach. Dr. Ola Spurr attempted DL x1  with Sabra Heck 2, also a Grade 3 view, successful. Recommend glidescope.

## 2016-08-13 NOTE — Progress Notes (Signed)
Oxygen saturations are low, patient has untreated COPD and cannot go home with low oxygen saturations.  Will get pulmonary consultation.  Kathryne Eriksson. Dahlia Bailiff, MD, [place] [phone] [facility] Surgery

## 2016-08-13 NOTE — Anesthesia Preprocedure Evaluation (Addendum)
Anesthesia Evaluation  Patient identified by MRN, date of birth, ID band Patient awake    Reviewed: Allergy & Precautions, H&P , NPO status , Patient's Chart, lab work & pertinent test results  History of Anesthesia Complications (+) PONV  Airway Mallampati: III  TM Distance: >3 FB Neck ROM: Full    Dental no notable dental hx. (+) Teeth Intact, Dental Advisory Given   Pulmonary COPD,  COPD inhaler, Current Smoker,    Pulmonary exam normal breath sounds clear to auscultation       Cardiovascular hypertension,  Rhythm:Regular Rate:Normal     Neuro/Psych  Headaches, Anxiety Depression    GI/Hepatic Neg liver ROS, GERD  Medicated and Controlled,  Endo/Other  negative endocrine ROS  Renal/GU negative Renal ROS  negative genitourinary   Musculoskeletal   Abdominal   Peds  Hematology negative hematology ROS (+)   Anesthesia Other Findings   Reproductive/Obstetrics negative OB ROS                            Anesthesia Physical Anesthesia Plan  ASA: II  Anesthesia Plan: General   Post-op Pain Management:    Induction: Intravenous  Airway Management Planned: Oral ETT  Additional Equipment:   Intra-op Plan:   Post-operative Plan: Extubation in OR  Informed Consent: I have reviewed the patients History and Physical, chart, labs and discussed the procedure including the risks, benefits and alternatives for the proposed anesthesia with the patient or authorized representative who has indicated his/her understanding and acceptance.   Dental advisory given  Plan Discussed with: CRNA  Anesthesia Plan Comments:         Anesthesia Quick Evaluation

## 2016-08-13 NOTE — Consult Note (Addendum)
Name: Sarah Barnes MRN: [id number] DOB: [birth year]    ADMISSION DATE:  08/13/2016 CONSULTATION DATE:  08/13/2016  REFERRING MD :  Hulen Skains, MD  CHIEF COMPLAINT:  Low oxygen level postoperative  HISTORY OF PRESENT ILLNESS:  66 year old heavy smoker, underwent laparoscopic repair of ventral hernia today. Postoperatively noted to be hypoxic, hence [facility] M consulted for management of COPD. Surgery was uneventful and she was extubated, she was noted to have oxygen saturation as low as 70% on ambulation without oxygen. She complains of abdominal pain. She reports dyspnea on climbing stairs  But denies dyspnea on walking on level ground. She smoked a maximum of 20 half packs per day and has now cut down to about one pack per day, more than 50 pack years, she moved from [place] to [place] but 2 years ago and is seen at [facility] for anxiety and depression and hypertension Home meds were reviewed and she also takes Lasix occasionally for pedal edema. She uses her albuterol MDI seldom She has never been diagnosed with COPD or obstructive sleep apnea She denies chronic cough, frequent chest colds or hospitalization for breathing issues .  She denies seasonal allergies or nocturnal wheezing     PAST MEDICAL HISTORY :   has a past medical history of Anxiety; Back pain; COPD (chronic obstructive pulmonary disease) ([place]); Depression; Dyspnea; Dysrhythmia; ETOH abuse; GERD (gastroesophageal reflux disease); Headache; Hepatitis; Hernia, hiatal; High cholesterol; Hypertension; Pneumonia; PONV (postoperative nausea and vomiting); and Sciatica.  has a past surgical history that includes Back surgery; Shoulder arthroscopy; and Cholecystectomy. Prior to Admission medications   Medication Sig Start Date End Date Taking? Authorizing Provider  albuterol (PROVENTIL HFA;VENTOLIN HFA) 108 (90 Base) MCG/ACT inhaler Inhale 2 puffs into the lungs every 6 (six) hours as needed for wheezing or shortness  of breath. 05/26/15  Yes Edward Saguier, PA-C  fluticasone (FLONASE) 50 MCG/ACT nasal spray Place 2 sprays into both nostrils daily. Patient taking differently: Place 2 sprays into both nostrils daily as needed (for nasal congestion.).  08/01/15  Yes Edward Saguier, PA-C  furosemide (LASIX) 40 MG tablet TAKE 1 TABLET EVERY DAY 06/11/16  Yes Edward Saguier, PA-C  pantoprazole (PROTONIX) 40 MG tablet TAKE 1 TABLET EVERY DAY 06/05/16  Yes Mackie Pai, PA-C  traZODone (DESYREL) 100 MG tablet TAKE 1 TABLET AT BEDTIME 06/25/16  Yes Edward Saguier, PA-C  venlafaxine XR (EFFEXOR-XR) 150 MG 24 hr capsule TAKE 1 CAPSULE EVERY DAY WITH BREAKFAST 07/03/16  Yes Edward Saguier, PA-C  ALPRAZolam Duanne Moron) 0.5 MG tablet Take 1 tablet (0.5 mg total) by mouth 2 (two) times daily as needed for anxiety. Patient not taking: Reported on 08/08/2016 02/23/16   Mackie Pai, PA-C  diclofenac (VOLTAREN) 75 MG EC tablet TAKE 1 TABLET (75 MG TOTAL) BY MOUTH 2 (TWO) TIMES DAILY. Patient not taking: Reported on 08/08/2016 02/23/16   Shelda Pal, DO  fenofibrate 160 MG tablet TAKE 1 TABLET EVERY DAY Patient not taking: Reported on 08/08/2016 07/25/16   Mackie Pai, PA-C  ibuprofen (ADVIL,MOTRIN) 200 MG tablet Take 400 mg by mouth daily.    Historical Provider, MD  oxyCODONE (OXY IR/ROXICODONE) 5 MG immediate release tablet Take 1-2 tablets (5-10 mg total) by mouth every 6 (six) hours as needed for severe pain. 08/13/16   Judeth Horn, MD  prazosin (MINIPRESS) 2 MG capsule TAKE 1 CAPSULE EVERY MORNING Patient not taking: Reported on 08/08/2016 06/05/16   Mackie Pai, PA-C   Allergies  Allergen Reactions  .  No Known Allergies     FAMILY HISTORY:  family history includes Diabetes in her mother; Hyperlipidemia in her mother; Hypertension in her father and mother. SOCIAL HISTORY:  reports that she has been smoking Cigarettes.  She has a 10.00 pack-year smoking history. She has never used smokeless tobacco. She reports that  she drinks about 25.2 oz of alcohol per week . She reports that she uses drugs, including Marijuana.  REVIEW OF SYSTEMS:   Positive for abdominal pain  Constitutional: Negative for fever, chills, weight loss, malaise/fatigue and diaphoresis.  HENT: Negative for hearing loss, ear pain, nosebleeds, congestion, sore throat, neck pain, tinnitus and ear discharge.   Eyes: Negative for blurred vision, double vision, photophobia, pain, discharge and redness.  Respiratory: Negative for cough, hemoptysis, sputum production, wheezing and stridor.   Cardiovascular: Negative for chest pain, palpitations, orthopnea, claudication, leg swelling and PND.  Gastrointestinal: Negative for heartburn, nausea, vomiting,  diarrhea, constipation, blood in stool and melena.  Genitourinary: Negative for dysuria, urgency, frequency, hematuria and flank pain.  Musculoskeletal: Negative for myalgias, back pain, joint pain and falls.  Skin: Negative for itching and rash.  Neurological: Negative for dizziness, tingling, tremors, sensory change, speech change, focal weakness, seizures, loss of consciousness, weakness and headaches.  Endo/Heme/Allergies: Negative for environmental allergies and polydipsia. Does not bruise/bleed easily.  SUBJECTIVE:   VITAL SIGNS: Temp:  [97 F (36.1 C)-97.9 F (36.6 C)] 97 F (36.1 C) (03/26 1630) Pulse Rate:  [64-92] 69 (03/26 1630) Resp:  [12-20] 13 (03/26 1630) BP: (96-165)/(60-106) 96/60 (03/26 1249) SpO2:  [78 %-96 %] 94 % (03/26 1630)  PHYSICAL EXAMINATION: Gen. Pleasant, obese, in no distress, normal affect ENT - no lesions, no post nasal drip, class 2 airway Neck: No JVD, no thyromegaly, no carotid bruits Lungs: no use of accessory muscles, no dullness to percussion, decreased without rales or rhonchi  Cardiovascular: Rhythm regular, heart sounds  normal, no murmurs or gallops, no peripheral edema Abdomen: soft and non-tender, no hepatosplenomegaly, BS  normal. Musculoskeletal: No deformities, no cyanosis or clubbing Neuro:  alert, non focal, no tremors    Recent Labs Lab 08/10/16 1325 08/13/16 0818  NA 140 142  K 2.6* 3.8  CL 101  --   CO2 26  --   BUN 8  --   CREATININE 0.77  --   GLUCOSE 98 107*    Recent Labs Lab 08/10/16 1325 08/13/16 0818  HGB 15.3* 12.9  HCT 42.9 38.0  WBC 7.9  --   PLT 227  --    No results found.  Chest x-ray from 3/23 shows old fractures, no infiltrates or effusions  ASSESSMENT / PLAN:  Hypoxia postoperative abdominal surgery Likely has underlying COPD based on her long history of smoking She may also have underlying OSA based on her body habitus andUpper airway exam  Recommend- Duo nebs every 6 hours while inpatient Monitor for 24 hours, Recheck oxygen levels at rest on room air on discharge If stable, can discharge on oxygen during sleep and exertion Can prescribe ANORO 1 puff daily on discharge Albuterol MDI 2 puffs every 6 hours as needed can be continued  Follow-up appointment made at  [facility] [place] [facility] for 2 weeks - when we will reassess her oxygen status and decide if she needs long-term oxygen  She will also need spirometry pre-and post in the future once she has recovered fully from her surgery  Smoking cessation obviously paramount and this was emphasized to her.  Thank you for  this consult  Kara Mead MD. Shade Flood. [place] [facility] Pager [phone] If no response call [phone]     08/13/2016, 4:46 PM

## 2016-08-13 NOTE — Progress Notes (Signed)
Sarah Barnes to be D/C'd  per MD order. Discussed with the patient and all questions fully answered.  VSS, Skin clean, dry and intact without evidence of skin break down, no evidence of skin tears noted.  IV catheter discontinued intact. Site without signs and symptoms of complications. Dressing and pressure applied.  An After Visit Summary was printed and given to the patient. Patient received prescription.  D/c education completed with patient/family including follow up instructions, medication list, d/c activities limitations if indicated, with other d/c instructions as indicated by MD - patient able to verbalize understanding, all questions fully answered.   Patient instructed to return to ED, call 911, or call MD for any changes in condition.   Patient to be escorted via [place], and D/C home via private auto.

## 2016-08-13 NOTE — Anesthesia Postprocedure Evaluation (Signed)
Anesthesia Post Note  Patient: Sarah Barnes  Procedure(s) Performed: Procedure(s) (LRB): LAPAROSCOPIC VENTRAL HERNIA REPAIR WITH MESH (N/A)  Patient location during evaluation: PACU Anesthesia Type: General Level of consciousness: awake and alert Pain management: pain level controlled Vital Signs Assessment: post-procedure vital signs reviewed and stable Respiratory status: spontaneous breathing, nonlabored ventilation, respiratory function stable and patient connected to nasal cannula oxygen Cardiovascular status: blood pressure returned to baseline and stable Postop Assessment: no signs of nausea or vomiting Anesthetic complications: no       Last Vitals:  Vitals:   08/13/16 1430 08/13/16 1445  BP:    Pulse: 66 66  Resp: 15 14  Temp:      Last Pain:  Vitals:   08/13/16 1155  TempSrc:   PainSc: 8                  Detria Cummings,W. EDMOND

## 2016-08-14 ENCOUNTER — Other Ambulatory Visit: Payer: Self-pay | Admitting: Medical

## 2016-08-14 ENCOUNTER — Telehealth: Payer: Self-pay | Admitting: Surgery

## 2016-08-14 ENCOUNTER — Encounter (HOSPITAL_COMMUNITY): Payer: Self-pay | Admitting: General Surgery

## 2016-08-14 DIAGNOSIS — Z79899 Other long term (current) drug therapy: Secondary | ICD-10-CM | POA: Diagnosis not present

## 2016-08-14 DIAGNOSIS — Z791 Long term (current) use of non-steroidal anti-inflammatories (NSAID): Secondary | ICD-10-CM | POA: Diagnosis not present

## 2016-08-14 DIAGNOSIS — I10 Essential (primary) hypertension: Secondary | ICD-10-CM | POA: Diagnosis not present

## 2016-08-14 DIAGNOSIS — K430 Incisional hernia with obstruction, without gangrene: Secondary | ICD-10-CM | POA: Diagnosis not present

## 2016-08-14 DIAGNOSIS — F17210 Nicotine dependence, cigarettes, uncomplicated: Secondary | ICD-10-CM | POA: Diagnosis not present

## 2016-08-14 DIAGNOSIS — K219 Gastro-esophageal reflux disease without esophagitis: Secondary | ICD-10-CM | POA: Diagnosis not present

## 2016-08-14 DIAGNOSIS — F419 Anxiety disorder, unspecified: Secondary | ICD-10-CM | POA: Diagnosis not present

## 2016-08-14 DIAGNOSIS — J449 Chronic obstructive pulmonary disease, unspecified: Secondary | ICD-10-CM | POA: Diagnosis not present

## 2016-08-14 DIAGNOSIS — Z7951 Long term (current) use of inhaled steroids: Secondary | ICD-10-CM | POA: Diagnosis not present

## 2016-08-14 MED ORDER — IPRATROPIUM-ALBUTEROL 0.5-2.5 (3) MG/3ML IN SOLN: 3.0000 mL | RESPIRATORY_TRACT | 1 refills | Status: DC | PRN

## 2016-08-14 NOTE — Discharge Summary (Signed)
Physician Discharge Summary  Patient ID: Sarah Barnes MRN: [id number] DOB/AGE: [birth year] 66 y.o.  Admit date: 08/13/2016 Discharge date: 08/14/2016  Admission Diagnoses:  Discharge Diagnoses:  Active Problems:   Ventral hernia   Discharged Condition: good  Hospital Course: Admitted postoperatively because of moderate to sever hyposemia which has resolved.  Has COPD, but not treated aggressively at home.  Probably postoerative atelectasis leading to her initial decompensation.  Consults: pulmonary/intensive care  Significant Diagnostic Studies: radiology: CXR: atelectasis bilaterally  Treatments: IV hydration and surgery: laparoscopic ventral hernia repair  Discharge Exam: Blood pressure 129/82, pulse 98, temperature 98 F (36.7 C), temperature source Oral, resp. rate 19, weight 88.4 kg (194 lb 14.2 oz), SpO2 98 %. General appearance: alert, cooperative and no distress Resp: clear to auscultation bilaterally and no wheezes today. GI: soft, non-tender; bowel sounds normal; no masses,  no organomegaly and wounds are covered and with mild bruising.  Disposition: 01-Home or Self Care  Discharge Instructions    Call MD for:  difficulty breathing, headache or visual disturbances    Complete by:  As directed    Call MD for:  extreme fatigue    Complete by:  As directed    Call MD for:  hives    Complete by:  As directed    Call MD for:  persistant dizziness or light-headedness    Complete by:  As directed    Call MD for:  persistant nausea and vomiting    Complete by:  As directed    Call MD for:  redness, tenderness, or signs of infection (pain, swelling, redness, odor or green/yellow discharge around incision site)    Complete by:  As directed    Call MD for:  severe uncontrolled pain    Complete by:  As directed    Call MD for:  temperature >100.4    Complete by:  As directed    Diet - low sodium heart healthy    Complete by:  As directed    Diet - low sodium heart  healthy    Complete by:  As directed    Discharge instructions    Complete by:  As directed    See specific instructions for ventral hernia repair   Discharge instructions    Complete by:  As directed    See previous instructions   Driving Restrictions    Complete by:  As directed    No driving for one week or until not taking pain medications.   Driving Restrictions    Complete by:  As directed    No driving for at least 2 weeks or until not taking pain medications   Increase activity slowly    Complete by:  As directed    Increase activity slowly    Complete by:  As directed    Leave dressing on - Keep it clean, dry, and intact until clinic visit    Complete by:  As directed    Leave dressing on - Keep it clean, dry, and intact until clinic visit    Complete by:  As directed    Lifting restrictions    Complete by:  As directed    No lifting anything > 20 pounds for the next 3 weeks.   Lifting restrictions    Complete by:  As directed    No lifting > 20 pounds for the next 3 weeks   May shower / Bathe    Complete by:  As directed  Allergies as of 08/14/2016      Reactions   No Known Allergies       Medication List    TAKE these medications   albuterol 108 (90 Base) MCG/ACT inhaler Commonly known as:  PROVENTIL HFA;VENTOLIN HFA Inhale 2 puffs into the lungs every 6 (six) hours as needed for wheezing or shortness of breath.   ALPRAZolam 0.5 MG tablet Commonly known as:  XANAX Take 1 tablet (0.5 mg total) by mouth 2 (two) times daily as needed for anxiety.   diclofenac 75 MG EC tablet Commonly known as:  VOLTAREN TAKE 1 TABLET (75 MG TOTAL) BY MOUTH 2 (TWO) TIMES DAILY.   fenofibrate 160 MG tablet TAKE 1 TABLET EVERY DAY   fluticasone 50 MCG/ACT nasal spray Commonly known as:  FLONASE Place 2 sprays into both nostrils daily. What changed:  when to take this  reasons to take this   furosemide 40 MG tablet Commonly known as:  LASIX TAKE 1 TABLET EVERY  DAY   ibuprofen 200 MG tablet Commonly known as:  ADVIL,MOTRIN Take 400 mg by mouth daily.   ipratropium-albuterol 0.5-2.5 (3) MG/3ML Soln Commonly known as:  DUONEB Take 3 mLs by nebulization every 4 (four) hours as needed.   oxyCODONE 5 MG immediate release tablet Commonly known as:  Oxy IR/ROXICODONE Take 1-2 tablets (5-10 mg total) by mouth every 6 (six) hours as needed for severe pain.   pantoprazole 40 MG tablet Commonly known as:  PROTONIX TAKE 1 TABLET EVERY DAY   prazosin 2 MG capsule Commonly known as:  MINIPRESS TAKE 1 CAPSULE EVERY MORNING   traZODone 100 MG tablet Commonly known as:  DESYREL TAKE 1 TABLET AT BEDTIME   venlafaxine XR 150 MG 24 hr capsule Commonly known as:  EFFEXOR-XR TAKE 1 CAPSULE EVERY DAY WITH BREAKFAST      Follow-up Information    Judeth Horn, MD Follow up in 2 week(s).   Specialty:  General Surgery Contact information: [address]   [id number] [phone]        Rexene Edison, NP Follow up on 08/30/2016.   Specialty:  Pulmonary Disease Why:  9-45 am @ [facility] Contact information: [address] [address] [phone]           Signed: Judeth Horn 08/14/2016, 7:45 AM

## 2016-08-14 NOTE — Discharge Planning (Signed)
Patient discharged home in stable condition. Verbalizes understanding of all discharge instructions, including home medications and follow up appointments. 

## 2016-08-14 NOTE — Telephone Encounter (Signed)
Sarah Barnes had a lap ventral hernia yesterday, 3/26, by Dr. Hulen Skains.  She was sent home, but called to say that the abdominal binder was too tight.  She asked what to do.  After talking to her, she said that she had already taken the binder off.  I told her to try to put it on, only looser.  She'll call the office tomorrow if there is a question.  Alphonsa Overall, MD, [facility] Pager: [phone] Office phone:  [phone]

## 2016-08-30 ENCOUNTER — Inpatient Hospital Stay: Payer: Self-pay | Admitting: Adult Health

## 2016-09-03 ENCOUNTER — Other Ambulatory Visit: Payer: Self-pay | Admitting: Medical

## 2016-09-03 MED ORDER — FUROSEMIDE 40 MG PO TABS: 40.0000 mg | ORAL_TABLET | Freq: Every day | ORAL | 0 refills | Status: DC

## 2016-09-03 NOTE — Telephone Encounter (Signed)
Caller name: Jinx Relation to pt: self  Call back number: [phone] Pharmacy: [facility], [place]  Reason for call: Pt is needing refill on furosemide (LASIX) 40 MG tablet. Pt states is going to be out of meds soon and since it is mail pharmacy pt will need refill sent soon. Please advise ASAP.

## 2016-09-03 NOTE — Telephone Encounter (Signed)
Pt's potassium level on 3/23 was 2.6.  Repeat potassium on 08/13/16 was 3.8.  Pt requesting a refill on lasix be sent to local pharmacy.  90 day supply was sent to mail order on 08/31/16, but patient has not receive medication yet.  Please advise.

## 2016-09-03 NOTE — Telephone Encounter (Signed)
I sent in 7 days rx of lasix to our local pharmacy.  I did not send in 90 tabs of effexor to mail order. I need to confirm that he mood is stable and it is documented that mood is stable.   In past she took all her meds at one time when very depressed. I have limited her supply of meds due to this. I prefer writing mood medications in limited supply for this reason.   I could write 90 tabs if significant price difference. But prefer to just give 30 tabs.

## 2016-09-03 NOTE — Telephone Encounter (Signed)
Notified pt rx was sent in 08/31/16. Pt stated she should receive medication today.

## 2016-09-04 NOTE — Telephone Encounter (Signed)
Called patient and made her aware.  Pt states she has an appt with Percell Miller on Monday (4/23), but would like to come in sooner if possible. Pt states she's has been severely depressed since she last saw Percell Miller.  Denied suicidal or homicidal ideations, but states she hasn't been out of her house in months.  States every time she tries to go out, she has panic attacks.  Panic attacks have been happening daily.  She has a hx of anxiety/depression, but symptoms worsen when she was "shut off" her xanax. Pt states while she was in [place] she was on four xanax a day, but once she transferred care, she has been put on 1/2 tablet two times a day, and it hasn't been therapeutic.  Pt states she has not seen psychiatry as advised. States she can't get out of her house.  She instead has gotten an emotional support dog, which she has found most helpful.  She became tearful talking about her puppy.  Pt was advised to see provider sooner than Monday.  Pt agreed to come in tomorrow, but states she will have to check with her friend to see if she will be able to bring her in.  She said she will call her friend to see if she can bring her in and then call us back.  Awaiting call back.     Pt called back. Appt scheduled for tomorrow with Percell Miller at 9 am.

## 2016-09-05 ENCOUNTER — Encounter: Payer: Self-pay | Admitting: Medical

## 2016-09-05 ENCOUNTER — Ambulatory Visit (INDEPENDENT_AMBULATORY_CARE_PROVIDER_SITE_OTHER): Payer: Medicare HMO | Admitting: Medical

## 2016-09-05 VITALS — BP 139/77 | HR 95 | Temp 97.8°F | Resp 16 | Ht 62.0 in | Wt 196.2 lb

## 2016-09-05 DIAGNOSIS — E876 Hypokalemia: Secondary | ICD-10-CM | POA: Diagnosis not present

## 2016-09-05 DIAGNOSIS — F32A Depression, unspecified: Secondary | ICD-10-CM

## 2016-09-05 DIAGNOSIS — R002 Palpitations: Secondary | ICD-10-CM

## 2016-09-05 DIAGNOSIS — F411 Generalized anxiety disorder: Secondary | ICD-10-CM

## 2016-09-05 DIAGNOSIS — F329 Major depressive disorder, single episode, unspecified: Secondary | ICD-10-CM

## 2016-09-05 LAB — COMPREHENSIVE METABOLIC PANEL
ALBUMIN: 4.5 g/dL (ref 3.5–5.2)
ALT: 14 U/L (ref 0–35)
AST: 19 U/L (ref 0–37)
Alkaline Phosphatase: 58 U/L (ref 39–117)
BUN: 14 mg/dL (ref 6–23)
CALCIUM: 9.1 mg/dL (ref 8.4–10.5)
CHLORIDE: 107 meq/L (ref 96–112)
CO2: 26 meq/L (ref 19–32)
Creatinine, Ser: 0.69 mg/dL (ref 0.40–1.20)
GFR: 89.94 mL/min (ref >60.00)
Glucose, Bld: 98 mg/dL (ref 70–99)
Potassium: 3.6 mEq/L (ref 3.5–5.1)
Sodium: 142 mEq/L (ref 135–145)
Total Bilirubin: 0.3 mg/dL (ref 0.2–1.2)
Total Protein: 7.4 g/dL (ref 6.0–8.3)

## 2016-09-05 MED ORDER — BUSPIRONE HCL 15 MG PO TABS: 15.0000 mg | ORAL_TABLET | Freq: Two times a day (BID) | ORAL | 0 refills | Status: DC

## 2016-09-05 MED ORDER — POTASSIUM CHLORIDE ER 10 MEQ PO TBCR: EXTENDED_RELEASE_TABLET | ORAL | 0 refills | Status: DC

## 2016-09-05 MED ORDER — VENLAFAXINE HCL ER 150 MG PO CP24: ORAL_CAPSULE | ORAL | 0 refills | Status: DC

## 2016-09-05 MED FILL — IPRAT-ALBUT 0.5-3(2.5) MG/3: 0.5-2.5 (3) | 20 days supply | Qty: 360 | Fill #0

## 2016-09-05 MED FILL — busPIRone HCL 15 MG TABS: 15 | 30 days supply | Qty: 60 | Fill #0

## 2016-09-05 NOTE — Patient Instructions (Addendum)
For anxiety and depression. I am filling your accomodation request form. (I do see benefit in your situation).  Refill of effexor and rx of buspar today. If you are doing well will consider 90 tab rx mail order for effexor in future. Reminder any thoughts/plans to harm self or others then ED evaluation.  With history of low potassium will get cmp today. And get ekg.   Also with history of recent 4 day palpitation one week ago will refer you to cardiologist.   Follow up in one month or as needed

## 2016-09-05 NOTE — Progress Notes (Signed)
Pre visit review using our clinic review tool, if applicable. No additional management support is needed unless otherwise documented below in the visit note. 

## 2016-09-05 NOTE — Progress Notes (Signed)
Subjective:    Patient ID: Sarah Barnes, female    DOB: 02/17/1951, 66 y.o.   MRN: [id number]  HPI  Pt in for follow up.  She has anxiety and depression. Pt struggles with her mood for years. She recently got emotional support Dog. She states dog is "registered". She has had dog for a month and mood is improved.  Pt has her own apartment now. Away from conflict between sister and her boyfriend.  Pt request that if I sign accomadation request for her dog they will not charge her $300 and $20 dollars per month. If she get me to sign form then fees will be waived. She can't afford this. The dog appears to be beneficial for her mood. Pt is no longer feeling suicidal. At one point in past she had attempt. No homicidal thoughts.  Pt is on trazadone and effexor.  In past I had offered her medication in place of xanax. In past we declined to write her xanax. Referred her to psychiatrist.  She was no compliant on referral process.   Pt had low k before her hernia repair. Before surgery had palpitations. They gave her k and palpitation resolved. Pt has been self supplamenting otc k tabs. She states last palpitations about one week ago.(she stats palpitation lasted for 4 days). But no palpitation for one week. No sob and no chest pain.   Review of Systems  Constitutional: Negative for chills, fatigue and fever.  HENT: Negative for congestion, drooling, ear discharge, ear pain, postnasal drip, sinus pain and sinus pressure.   Respiratory: Negative for chest tightness, shortness of breath and wheezing.   Cardiovascular: Negative for chest pain and palpitations.       Hx of palpitations.  Gastrointestinal: Negative for abdominal pain.  Musculoskeletal: Negative for back pain.  Neurological: Negative for dizziness, seizures, weakness and light-headedness.  Hematological: Negative for adenopathy. Does not bruise/bleed easily.  Psychiatric/Behavioral: Positive for dysphoric mood. Negative for  agitation, behavioral problems, confusion, self-injury, sleep disturbance and suicidal ideas. The patient is nervous/anxious.      Past Medical History:  Diagnosis Date  . Anemia   . Anxiety   . Arthritis    "all over" (08/13/2016)  . Chronic bronchitis ([place])   . COPD (chronic obstructive pulmonary disease) ([place])   . Depression   . Dyspnea    occ  . Dysrhythmia    palpitations occ due to anxiety  . ETOH abuse   . GERD (gastroesophageal reflux disease)   . Headache    "daily til I got glasses; now have headache once in awhile" (08/13/2016)  . Hepatitis 1960   "isolated &  hospitalized for 2 weeks; don't know which kind of hepatitis"   . Hernia, hiatal   . High cholesterol   . Hypertension   . Pneumonia    "once or twice" (08/13/2016)  . PONV (postoperative nausea and vomiting)   . Sciatica      Social History   Social History  . Marital status: Single    Spouse name: N/A  . Number of children: N/A  . Years of education: N/A   Occupational History  . Not on file.   Social History Main Topics  . Smoking status: Current Every Day Smoker    Packs/day: 0.50    Years: 49.00    Types: Cigarettes  . Smokeless tobacco: Never Used  . Alcohol use 50.4 oz/week    48 Shots of liquor, 36 Cans of beer per week  Comment: 08/13/2016 "couple beers-1 quart vodka daily; depends on my mood; 3, 12 packs of beer/week 1 1/2 quarts vodka/week""  . Drug use: Yes    Types: Marijuana     Comment: 08/13/2016 "1 joint once/week"  . Sexual activity: Not Currently   Other Topics Concern  . Not on file   Social History Narrative  . No narrative on file    Past Surgical History:  Procedure Laterality Date  . ANAL FISTULECTOMY  1970s X 3  . BACK SURGERY    . HERNIA REPAIR    . KNEE ARTHROSCOPY Right   . LAPAROSCOPIC CHOLECYSTECTOMY    . LAPAROSCOPIC INCISIONAL / UMBILICAL / VENTRAL HERNIA REPAIR  08/13/2016   VHR w/mesh  . LUMBAR DISC SURGERY     "herniated disc"  . NASAL  FRACTURE SURGERY  1970s X 2  . SHOULDER ARTHROSCOPY WITH ROTATOR CUFF REPAIR Right 1990s?  . TONSILLECTOMY  1951  . VENTRAL HERNIA REPAIR N/A 08/13/2016   Procedure: LAPAROSCOPIC VENTRAL HERNIA REPAIR WITH MESH;  Surgeon: Judeth Horn, MD;  Location[facility] OR;  Service: General;  Laterality: N/A;    Family History  Problem Relation Age of Onset  . Diabetes Mother   . Hyperlipidemia Mother   . Hypertension Mother   . Hypertension Father     Allergies  Allergen Reactions  . No Known Allergies     Current Outpatient Prescriptions on File Prior to Visit  Medication Sig Dispense Refill  . albuterol (PROVENTIL HFA;VENTOLIN HFA) 108 (90 Base) MCG/ACT inhaler Inhale 2 puffs into the lungs every 6 (six) hours as needed for wheezing or shortness of breath. 1 Inhaler 0  . diclofenac (VOLTAREN) 75 MG EC tablet TAKE 1 TABLET (75 MG TOTAL) BY MOUTH 2 (TWO) TIMES DAILY. 60 tablet 1  . fenofibrate 160 MG tablet TAKE 1 TABLET EVERY DAY 90 tablet 0  . fluticasone (FLONASE) 50 MCG/ACT nasal spray Place 2 sprays into both nostrils daily. (Patient taking differently: Place 2 sprays into both nostrils daily as needed (for nasal congestion.). ) 16 g 1  . furosemide (LASIX) 40 MG tablet Take 1 tablet (40 mg total) by mouth daily. 7 tablet 0  . ipratropium-albuterol (DUONEB) 0.5-2.5 (3) MG/3ML SOLN Take 3 mLs by nebulization every 4 (four) hours as needed. 360 mL 1  . pantoprazole (PROTONIX) 40 MG tablet TAKE 1 TABLET EVERY DAY 90 tablet 0  . prazosin (MINIPRESS) 2 MG capsule TAKE 1 CAPSULE EVERY MORNING 90 capsule 0  . traZODone (DESYREL) 100 MG tablet TAKE 1 TABLET AT BEDTIME 90 tablet 3   No current facility-administered medications on file prior to visit.     BP 139/77 (BP Location: Left Arm, Patient Position: Sitting, Cuff Size: Large)   Pulse 95   Temp 97.8 F (36.6 C) (Oral)   Resp 16   Ht 5\' 2"  (1.575 m)   Wt 196 lb 3.2 oz (89 kg)   SpO2 93%   BMI 35.89 kg/m       Objective:   Physical  Exam  General Mental Status- Alert. General Appearance- Not in acute distress.   Skin General: Color- Normal Color. Moisture- Normal Moisture.  Neck Carotid Arteries- Normal color. Moisture- Normal Moisture. No carotid bruits. No JVD.  Chest and Lung Exam Auscultation: Breath Sounds:-Normal.  Cardiovascular Auscultation:Rythm- Regular. Murmurs & Other Heart Sounds:Auscultation of the heart reveals- No Murmurs.  Abdomen Inspection:-Inspeection Normal. Palpation/Percussion:Note:No mass. Palpation and Percussion of the abdomen reveal- Non Tender, Non Distended + BS, no rebound or  guarding.  Neurologic Cranial Nerve exam:- CN III-XII intact(No nystagmus), symmetric smile. Strength:- 5/5 equal and symmetric strength both upper and lower extremities.      Assessment & Plan:  ekg showed a lot of artifact. So much so could not give accurate read. Wanted to repeat. Pt refused repeat. She got up and left/refusing repeat ekg.(note currently no cardiac or respiratory symptoms)  For anxiety and depression. I am filling your accomodation request form. (I do see benefit in your situation).  Refill of effexor and rx of buspar today. If you are doing well will consider 90 tab rx mail order for effexor in future.  With history of low potassium will get cmp today. And get ekg.   Also with history of recent 4 day palpitation one week ago will refer you to cardiologist.   Follow up in one month or as needed

## 2016-09-05 NOTE — Telephone Encounter (Signed)
rx k-dur sent in.

## 2016-09-06 MED FILL — POTASSIUM CL 10 MEQ TAB SA: 10 | 60 days supply | Qty: 30 | Fill #0

## 2016-09-06 NOTE — Telephone Encounter (Signed)
Patient states letter has to be on letter head please fax to [facility] fax [phone], patient states as soon as possible, thank you

## 2016-09-07 ENCOUNTER — Telehealth: Payer: Self-pay | Admitting: Medical

## 2016-09-07 NOTE — Telephone Encounter (Signed)
Pt says that provider completed paperwork in regards to her getting a support dog. Pt says that she now need a note on letter head. She would like to know if provider could complete and fax to the # below?     Fax# : [phone] -

## 2016-09-09 NOTE — Telephone Encounter (Signed)
I typed her letter regarding her therapy dog. Please get off printer and remind me to sign. If by chance it did not print then let me know. I was having problems with epic from home.

## 2016-09-10 ENCOUNTER — Ambulatory Visit: Payer: Self-pay | Admitting: Medical

## 2016-09-10 MED FILL — VENLAFAXINE HCL ER 150 MG C: 150 | 30 days supply | Qty: 30 | Fill #0

## 2016-09-10 NOTE — Telephone Encounter (Signed)
Pt called in to follow up on note faxed. She says that it is to go to [facility] fax# [phone]. Pt says that she confirmed that it hasn't been sent. Please assist further.   Please confirm faxed w/ pt at [phone]

## 2016-09-10 NOTE — Telephone Encounter (Signed)
Pt is needing letter for today 09-10-2016 to be sent to Fax number [phone]. Pt stated to please call her just to let her know if letter was faxed and if not pt stated can come and get the letter, but that is needing it for today. Please advise ASAP.

## 2016-09-10 NOTE — Telephone Encounter (Signed)
Called pt back to confirm that fax was sent.  Thanks.

## 2016-09-10 NOTE — Telephone Encounter (Signed)
Letter faxed to [facility] at [phone].

## 2016-09-11 NOTE — Telephone Encounter (Signed)
Pt called to give thanks to our staff for helping her out with letter. Pt was really greatful.

## 2016-09-26 ENCOUNTER — Other Ambulatory Visit: Payer: Self-pay | Admitting: Medical

## 2016-10-08 ENCOUNTER — Other Ambulatory Visit: Payer: Self-pay | Admitting: Medical

## 2016-10-11 ENCOUNTER — Other Ambulatory Visit: Payer: Self-pay

## 2016-10-11 MED ORDER — BUSPIRONE HCL 15 MG PO TABS: 15.0000 mg | ORAL_TABLET | Freq: Two times a day (BID) | ORAL | 0 refills | Status: DC

## 2016-10-11 MED FILL — busPIRone HCL 15 MG TABS: 15 | 30 days supply | Qty: 60 | Fill #0

## 2016-10-11 NOTE — Telephone Encounter (Signed)
Patient called in to request refill on Buspirone (Wellbutrin) States it is helping her some since she has been off Ativan. Refill ok'd by Mackie Pai, PA-C. Sent to pharmacy patient notified.

## 2016-10-19 ENCOUNTER — Encounter: Payer: Self-pay | Admitting: Medical

## 2016-10-22 ENCOUNTER — Other Ambulatory Visit: Payer: Self-pay | Admitting: Medical

## 2016-11-05 ENCOUNTER — Other Ambulatory Visit: Payer: Self-pay | Admitting: Medical

## 2016-12-17 DIAGNOSIS — F10129 Alcohol abuse with intoxication, unspecified: Secondary | ICD-10-CM | POA: Diagnosis not present

## 2016-12-17 DIAGNOSIS — F419 Anxiety disorder, unspecified: Secondary | ICD-10-CM | POA: Diagnosis not present

## 2016-12-17 DIAGNOSIS — R079 Chest pain, unspecified: Secondary | ICD-10-CM | POA: Diagnosis not present

## 2016-12-17 DIAGNOSIS — F10120 Alcohol abuse with intoxication, uncomplicated: Secondary | ICD-10-CM | POA: Diagnosis not present

## 2016-12-17 DIAGNOSIS — Z5321 Procedure and treatment not carried out due to patient leaving prior to being seen by health care provider: Secondary | ICD-10-CM | POA: Diagnosis not present

## 2016-12-17 DIAGNOSIS — R002 Palpitations: Secondary | ICD-10-CM | POA: Diagnosis not present

## 2016-12-19 DIAGNOSIS — I2510 Atherosclerotic heart disease of native coronary artery without angina pectoris: Secondary | ICD-10-CM | POA: Diagnosis present

## 2016-12-19 DIAGNOSIS — I421 Obstructive hypertrophic cardiomyopathy: Secondary | ICD-10-CM

## 2016-12-19 HISTORY — DX: Obstructive hypertrophic cardiomyopathy: I42.1

## 2016-12-19 HISTORY — PX: TRANSTHORACIC ECHOCARDIOGRAM: SHX275

## 2016-12-21 IMAGING — DX DG KNEE 3 VIEWS*L*
3 series · 3 of 3 positions shown · non-contrast
Comparison: None.

CLINICAL DATA: 64-year-old female with chronic left knee pain.
Crepitus. Initial encounter.

EXAM:
LEFT KNEE - 3 VIEW

[knee ap]
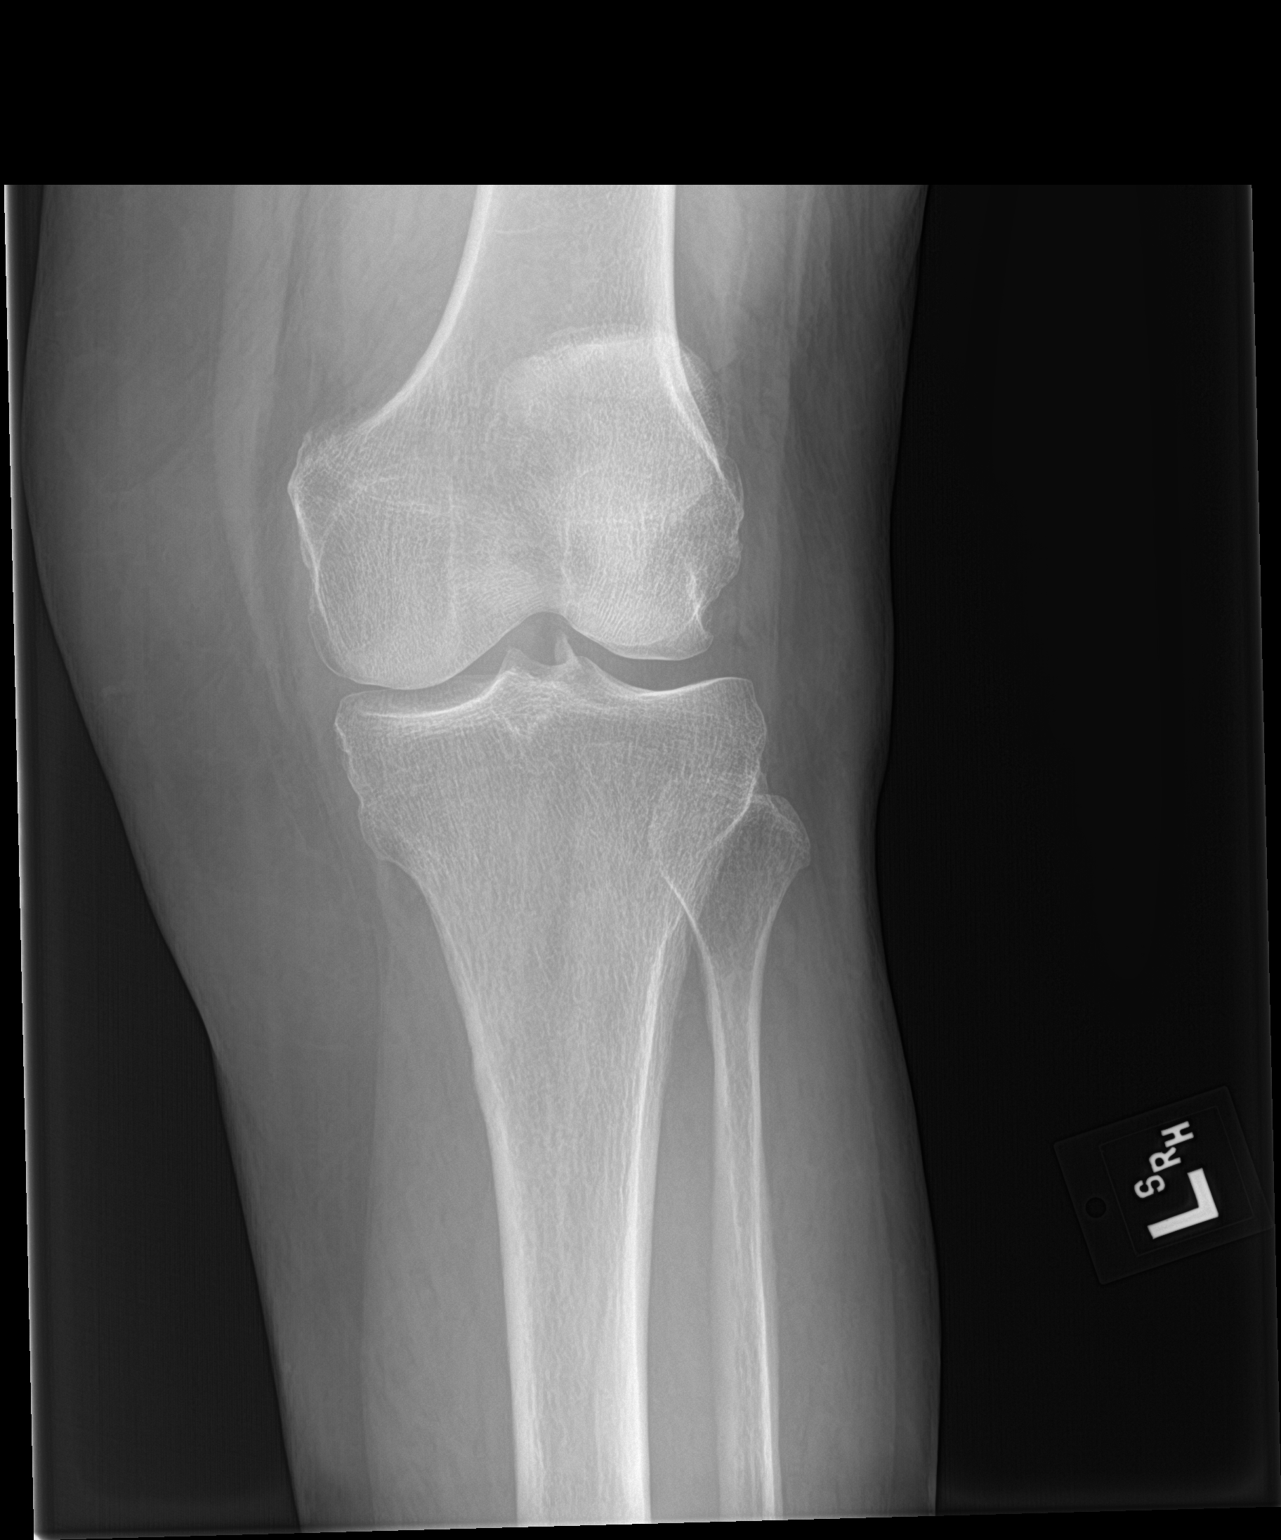

[knee lat]
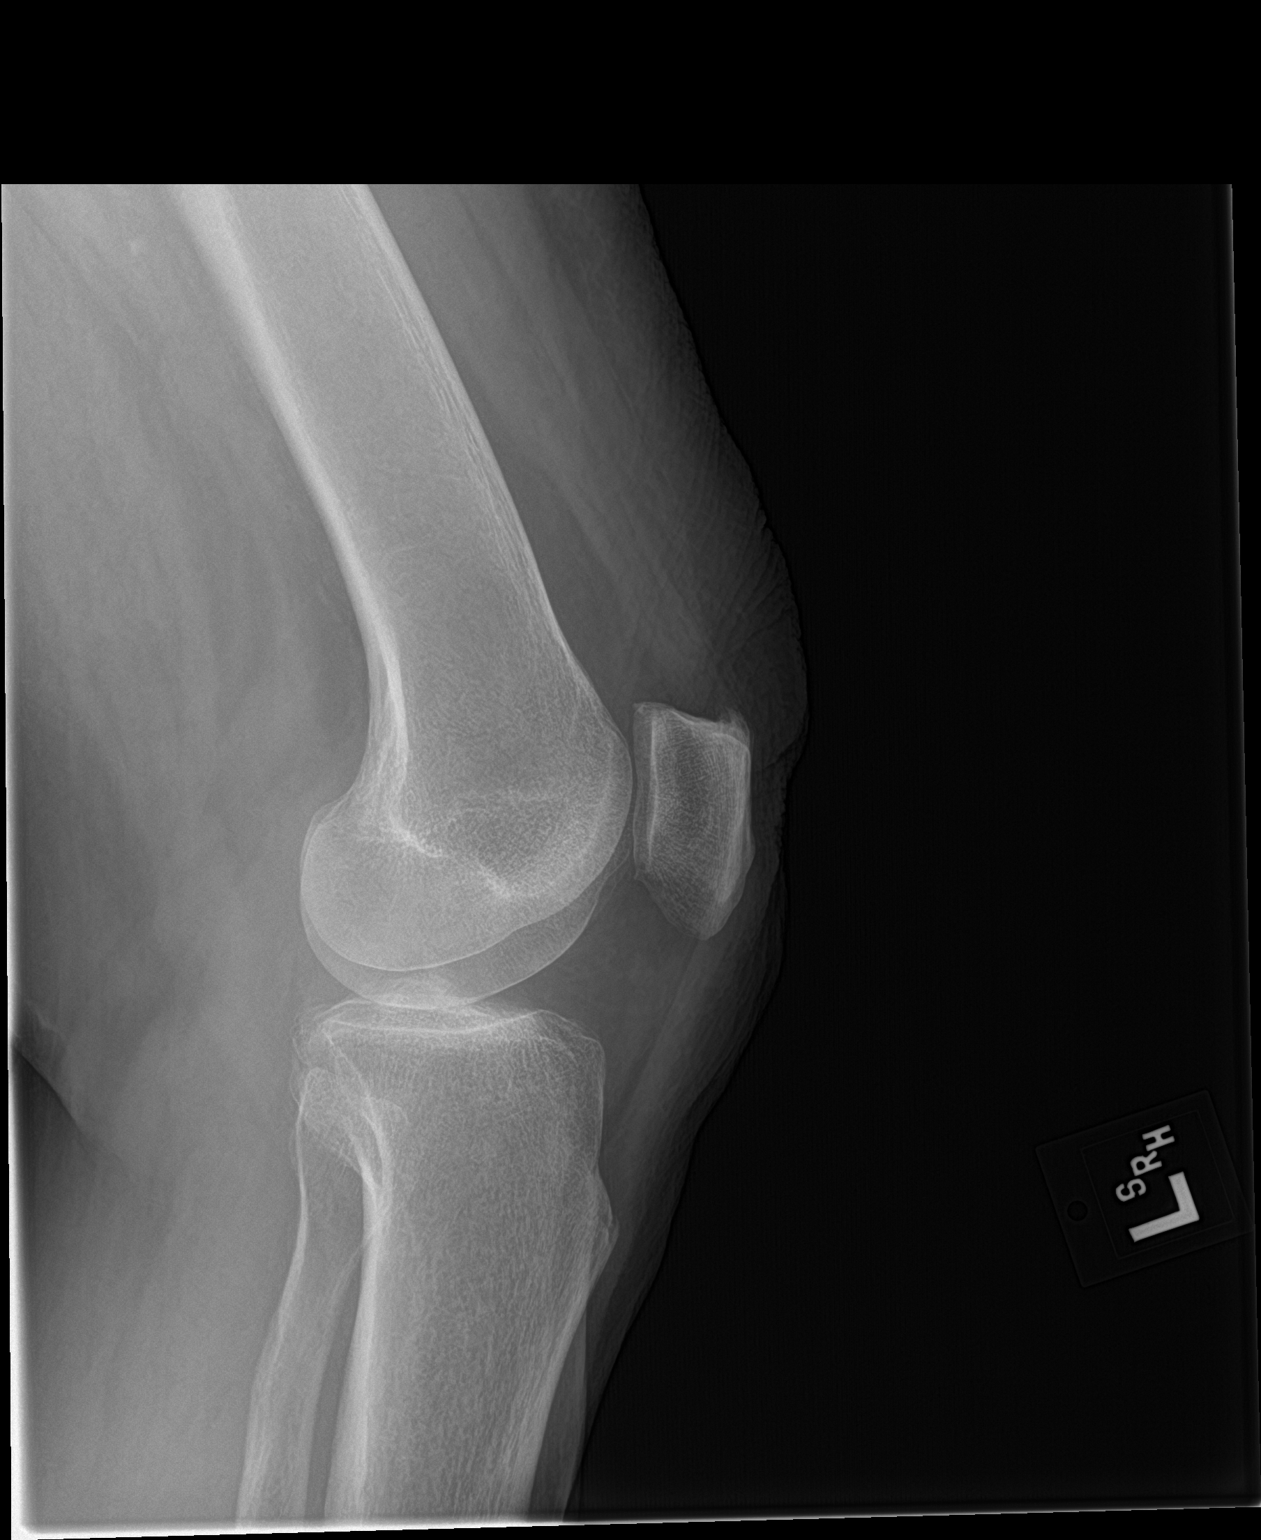

[knee sunrise]
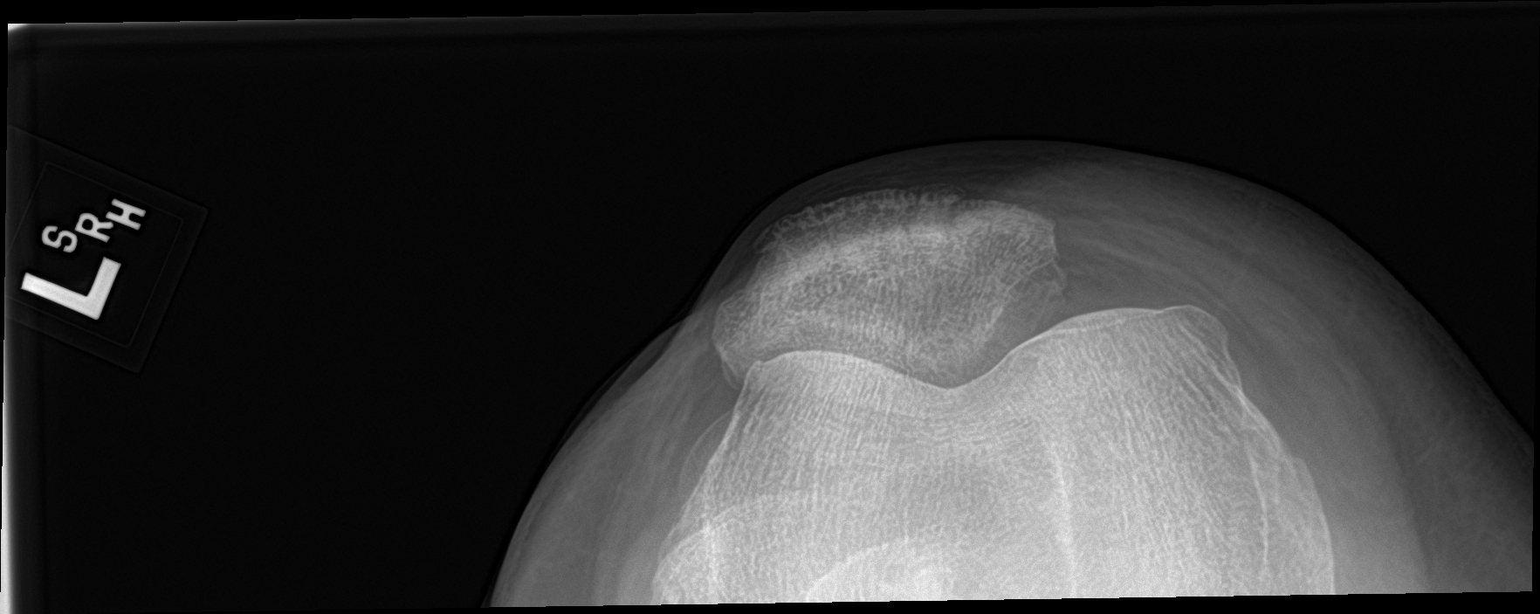

[3 of 3 positions shown; findings below may reference images not displayed]

FINDINGS: Weightbearing views. No joint effusion. Mild to moderate medial
compartment joint space loss. Moderate to severe patellofemoral
compartment joint space loss. Mild to moderate patellar and lateral
compartment degenerative spurring. Bone mineralization is within
normal limits. No acute osseous abnormality identified.
IMPRESSION: Medial and patellofemoral compartment degenerative changes, more
pronounced at the latter.

No joint effusion or  No acute osseous abnormality.

## 2016-12-24 ENCOUNTER — Ambulatory Visit (INDEPENDENT_AMBULATORY_CARE_PROVIDER_SITE_OTHER): Payer: Medicare HMO | Admitting: Cardiology

## 2016-12-24 ENCOUNTER — Encounter: Payer: Self-pay | Admitting: Cardiology

## 2016-12-24 VITALS — BP 100/60 | HR 96 | Ht 63.0 in | Wt 190.0 lb

## 2016-12-24 DIAGNOSIS — R0602 Shortness of breath: Secondary | ICD-10-CM

## 2016-12-24 DIAGNOSIS — I358 Other nonrheumatic aortic valve disorders: Secondary | ICD-10-CM | POA: Diagnosis not present

## 2016-12-24 DIAGNOSIS — I10 Essential (primary) hypertension: Secondary | ICD-10-CM | POA: Diagnosis not present

## 2016-12-24 DIAGNOSIS — F411 Generalized anxiety disorder: Secondary | ICD-10-CM

## 2016-12-24 DIAGNOSIS — F1910 Other psychoactive substance abuse, uncomplicated: Secondary | ICD-10-CM

## 2016-12-24 DIAGNOSIS — R002 Palpitations: Secondary | ICD-10-CM | POA: Diagnosis not present

## 2016-12-24 DIAGNOSIS — F4001 Agoraphobia with panic disorder: Secondary | ICD-10-CM | POA: Diagnosis not present

## 2016-12-24 DIAGNOSIS — F17200 Nicotine dependence, unspecified, uncomplicated: Secondary | ICD-10-CM

## 2016-12-24 NOTE — Patient Instructions (Signed)
SCHEDULE [facility] has recommended that you wear a holter monitor 48 HOURS. Holter monitors are medical devices that record the heart's electrical activity. Doctors most often use these monitors to diagnose arrhythmias. Arrhythmias are problems with the speed or rhythm of the heartbeat. The monitor is a small, portable device. You can wear one while you do your normal daily activities. This is usually used to diagnose what is causing palpitations/syncope (passing out).   Your physician has requested that you have an echocardiogram. Echocardiography is a painless test that uses sound waves to create images of your heart. It provides your doctor with information about the size and shape of your heart and how well your heart's chambers and valves are working. This procedure takes approximately one hour. There are no restrictions for this procedure.    NO OTHER CHANGES    Your physician recommends that you schedule a follow-up appointment in 1-2 MONTHS WITH DR HARDING.

## 2016-12-24 NOTE — Progress Notes (Addendum)
PCP: Mackie Pai, PA-C  Clinic Note: Chief Complaint  Patient presents with  . New Patient (Initial Visit)    palpitations     HPI: Sarah Barnes is a 66 y.o. female with a PMH below who presents today for evaluation of palpitations  is a  at the request of [place], Percell Miller, [place]. Sarah Barnes has generalized anxiety disorder and clearcut Agoraphobia. 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    Sarah Barnes went to the ER in [facility] on 7/30 for similar symptoms while waiting for her appointment.  Recent Hospitalizations: ER visit  Studies Personally Reviewed - (if available, images/films reviewed: From Epic Chart or Care Everywhere)  none  Interval History: Sarah Barnes presents today For evaluation of what seems to be chronic palpitations. She states that she is not feeling well today. She states that she's having intermittent chest discomfort with palpitations. She feels dizzy trouble breathing. She is covered by her sister who talks a lot for her indicating that this is been a long-term problem for her and a lot like her mother who had angina and finally died of massive heart attack. Sarah Barnes herself is extremely anxious and nervous today. Very hard to get a good story from her. Basically it sounds like since her surgery for hernia repair in March, she isn't having trouble with palpitations that occur with and without walking. They occur oftentimes at rest. But throughout the day she will have them regardless. She says she is having them while she is talking to me. She says that she'll feel her heart rates going fast and then slow down to go slowly. They have been pretty much throughout the course the day. She says that eating will trigger palpitations, and once these palpitations start, they "go all day long". When I asked her if she truthfully has palpitations that are faster irregular all  day long, she says now she has has intermittent ""flopping symptoms. But then she'll feel her heart rate go fast. When her heart rate goes fast she feels discomfort in her chest and feels short of breath as well as dizzy.  Oftentimes when she has these palpitations nausea feels dizzy and lightheaded. She has not passed out. She does occasionally feel lightheaded and dizzy which first stands up, but has not passed out. She does note exertional dyspnea, but denies any significant chest tightness or pressure   No TIA/amaurosis fugax symptoms. No melena, hematochezia, hematuria, or epstaxis. No claudication.  ROS: A comprehensive was performed. Review of Systems  Constitutional: Positive for malaise/fatigue.       Recent URI symptoms were sore throat and coughing.  HENT: Positive for congestion and sore throat.   Respiratory: Positive for cough, sputum production, shortness of breath and wheezing.   Gastrointestinal: Positive for heartburn. Negative for blood in stool, constipation and melena.  Genitourinary: Positive for frequency. Negative for dysuria.  Musculoskeletal: Positive for back pain and joint pain. Negative for falls.  Neurological: Positive for dizziness. Negative for focal weakness, seizures and loss of consciousness.  Endo/Heme/Allergies: Positive for environmental allergies.  Psychiatric/Behavioral: Positive for depression. The patient is nervous/anxious and has insomnia.   All other systems reviewed and are negative.    I have reviewed and (if needed) personally updated the patient's problem list, medications, allergies, past medical and surgical history, social and family history.   Past Medical History:  Diagnosis Date  . Anemia   . Anxiety   . Arthritis    "all over" (08/13/2016)  . Chronic bronchitis ([place])   . COPD (chronic obstructive pulmonary disease) ([place])   . Depression   . Dyspnea    occ  . Dysrhythmia    palpitations occ due to anxiety  . ETOH abuse   .  GERD (gastroesophageal reflux disease)   . Headache    "daily til I got glasses; now have headache once in awhile" (08/13/2016)  . Hepatitis 1960   "isolated &  hospitalized for 2 weeks; don't know which kind of hepatitis"   . Hernia, hiatal   . High cholesterol   . Hypertension   . Pneumonia    "once or twice" (08/13/2016)  . PONV (postoperative nausea and vomiting)   . Sciatica   She has anxiety and depression. Pt struggles with her mood for years. She recently got emotional support Dog. She states dog is "registered". She has had dog for a month and mood is improved.   Past Surgical History:  Procedure Laterality Date  . ANAL FISTULECTOMY  1970s X 3  . BACK SURGERY    . HERNIA REPAIR    . KNEE ARTHROSCOPY Right   . LAPAROSCOPIC CHOLECYSTECTOMY    . LAPAROSCOPIC INCISIONAL / UMBILICAL / VENTRAL HERNIA REPAIR  08/13/2016   VHR w/mesh  . LUMBAR DISC SURGERY     "herniated disc"  . NASAL FRACTURE SURGERY  1970s X 2  . SHOULDER ARTHROSCOPY WITH ROTATOR CUFF REPAIR Right 1990s?  . TONSILLECTOMY  1951  . VENTRAL HERNIA REPAIR N/A 08/13/2016   Procedure: LAPAROSCOPIC VENTRAL HERNIA REPAIR WITH MESH;  Surgeon: Judeth Horn, MD;  Location: [facility];  Service: General;  Laterality: N/A;    Current Meds  Medication Sig  . busPIRone (BUSPAR) 15 MG tablet Take 1 tablet (15 mg total) by mouth 2 (two) times daily. (Patient taking differently: Take 15 mg by mouth daily as needed (anxiety). )  . furosemide (LASIX) 40 MG tablet TAKE 1 TABLET EVERY DAY  . prazosin (MINIPRESS) 2 MG capsule TAKE 1 CAPSULE EVERY MORNING  . traZODone (DESYREL) 100 MG tablet TAKE 1 TABLET AT BEDTIME (Patient taking differently: TAKE 1 TABLET by mouth every morning for anxiety)    No Known Allergies  Social History   Social History  . Marital status: Single    Spouse name: N/A  . Number of children: N/A  . Years of education: N/A   Social History Main Topics  . Smoking status: Current Every Day Smoker     Packs/day: 1.50    Years: 49.00    Types: Cigarettes  . Smokeless tobacco: Never Used  . Alcohol use 50.4 oz/week    36 Cans of beer, 48 Shots of liquor per week     Comment: 08/13/2016 "couple beers-1 quart vodka daily; depends on my mood; 3, 12 packs of beer/week 1 1/2 quarts vodka/week""  . Drug use: Yes    Frequency: 1.0 time per week    Types: Marijuana, Cocaine     Comment: Cocaine + March 2017  . Sexual activity: Not Currently   Other Topics Concern  . None   Social History Narrative   Sarah Barnes has generalized anxiety disorder and clearcut Agoraphobia.   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    She currently lives with her sister.  Currently denies substance abuse beyond Tobacco (1-1/2 PPD) & EtOH (beer, vodka) - not ready to discuss quitting.    family history includes Angina in her mother; Cancer in her brother; Diabetes in her mother; Heart attack (age of onset: 84) in her mother; Hyperlipidemia in her mother; Hypertension in her father and mother; Stroke in her father.  Wt Readings from Last 3 Encounters:  12/26/16 195 lb 1.7 oz (88.5 kg)  12/24/16 190 lb (86.2 kg)  09/05/16 196 lb 3.2 oz (89 kg)    PHYSICAL EXAM BP 100/60   Pulse 96   Ht 5\' 3"  (1.6 m)   Wt 190 lb (86.2 kg)   BMI 33.66 kg/m  Physical Exam  Constitutional: She is oriented to person, place, and time. She appears well-developed and well-nourished.  Well-groomed, but thousand cigarettes and pet dander. Very on edge, and anxious  HENT:  Head: Normocephalic and atraumatic.  Mouth/Throat: No oropharyngeal exudate.  Eyes: Pupils are equal, round, and reactive to light. EOM are normal. No scleral icterus.  Neck: Normal range of motion. Neck supple. No hepatojugular reflux and no JVD present. Carotid bruit is not present.  Cardiovascular: Regular rhythm, S1 normal, S2 normal and intact distal pulses.    Occasional extrasystoles (Borderline tachycardic with extrasystole) are present. Exam reveals no gallop, no friction rub and no opening snap.   Murmur heard.  Harsh crescendo-decrescendo midsystolic murmur is present with a grade of 3/6  at the upper right sternal border radiating to the neck Pulmonary/Chest: Accessory muscle usage (At baseline, bucket-handle breathing) present. No respiratory distress. She has decreased breath sounds (Diffusely diminished breath sounds with intermittent expiratory wheeze and interstitial sounds. No rales or rhonchi).  Abdominal: Soft. Bowel sounds are normal. She exhibits no distension. There is no tenderness. There is no rebound.  Musculoskeletal: Normal range of motion. She exhibits no edema or deformity.  Neurological: She is alert and oriented to person, place, and time. No cranial nerve deficit. Coordination normal.  Skin: Skin is warm and dry. No rash noted. No erythema.  Psychiatric:  Very anxious, nervous mood and affect. Very pressured speech. Seems to be willing to let her sister tell a lot of her story.  Nursing note and vitals reviewed.    Adult ECG Report  Rate: 98 ;  Rhythm: normal sinus rhythm, premature atrial contractions (PAC) and PACs with aberrancy.  Borderline prolonged QT. Normal axis & durations;   Narrative Interpretation: borderline EKG (order not placed)  Other studies Reviewed: Additional studies/ records that were reviewed today include:  Recent Labs:  12/17/2016 Troponin Neg; CBC: W 13.1, H/H 16.3/44.3, Plt 244; TSH 2.94 CMP: Na+ 142, K+ *2.9, Cl- 101, CO2- 24, Bun/Cr 9/0.65, Glu 117, Ca++ 9.0; AST/ALT/AP 22/16/79.  Tox - EtOH 220.0; (Cocaine + Utox in March 2017)  CXR - shallow inspiration with LL base atelectasis. No Focal consolidation. EKG read as NSR - 65. LAD (-36), ~ Lateral MI, age undetermined.  ASSESSMENT / PLAN: Problem List Items Addressed This Visit    Aortic systolic murmur on examination    Murmur sound like  at least Mod AS - Plan 2 D Echo to assess.      Relevant Orders   ECHOCARDIOGRAM COMPLETE   Essential hypertension (Chronic)    Was reportedly on a BP medication in the past - but not on one now - BP borderline low. May make rate control options difficult.      Generalized anxiety disorder - Primary    I think that  most of her Sx may well be psycho-somatizations, but need to be sure. She is on PRN lorazepam, but ran out - otherwise is on Buspar & trazodone.      Heart palpitations - PACs (Chronic)    Her complaints are very difficult to diagnose - she clearly has PACs on EKG here, but I cannot truthfully tell whether or not she is having any true arrhythmias - some of her Sx sound like she may be having bursts of SVT or PAF.    Plan 48 monitor - this should be sufficient since her palpitations "last all day"      Relevant Orders   ECHOCARDIOGRAM COMPLETE   Holter monitor - 48 hour   Panic disorder with agoraphobia   Shortness of breath at rest   Relevant Orders   ECHOCARDIOGRAM COMPLETE   Holter monitor - 48 hour   Substance abuse    March 2017 - admitted for EtOH & Cocaine abuse. Just 12/17/16 -> High EtOH level.   Clearly related to Psychological condition. -- H/o Cocaine did not come up in our discussion (not noted on her intake form). -- makes Beta Blocker a poor option.      Tobacco use disorder    Not ready to even discuss smoking cessation.         Current medicines are reviewed at length with the patient today. (+/- concerns) n/a The following changes have been made: n/a  Patient Instructions  SCHEDULE [place] [address] Your physician has recommended that you wear a holter monitor 48 HOURS. Holter monitors are medical devices that record the heart's electrical activity. Doctors most often use these monitors to diagnose arrhythmias. Arrhythmias are problems with the speed or rhythm of the heartbeat. The monitor is a small, portable device. You  can wear one while you do your normal daily activities. This is usually used to diagnose what is causing palpitations/syncope (passing out).   Your physician has requested that you have an echocardiogram. Echocardiography is a painless test that uses sound waves to create images of your heart. It provides your doctor with information about the size and shape of your heart and how well your heart's chambers and valves are working. This procedure takes approximately one hour. There are no restrictions for this procedure.    NO OTHER CHANGES    Your physician recommends that you schedule a follow-up appointment in 1-2 MONTHS WITH DR Jillianne Gamino.    Studies Ordered:   Orders Placed This Encounter  Procedures  . Holter monitor - 48 hour  . ECHOCARDIOGRAM COMPLETE      Glenetta Hew, M.D., M.S. Interventional Cardiologist   Pager # [phone] Phone # [phone] [address] [place], [place] [address]   Epworth Sleepiness Scale: Situation   Chance of Dozing/Sleeping (0 = never , 1 = slight chance , 2 = moderate chance , 3 = high chance )   sitting and reading 2   watching TV 3   sitting inactive in a public place 0   being a passenger in a motor vehicle for an hour or more 3   lying down in the afternoon 3   sitting and talking to someone 0   sitting quietly after lunch (no alcohol) 2   while stopped for a few minutes in traffic as the driver 0   Total Score  13; HTN, insomnia, No sex drive & feels stressed with lack of motivation. + stops breathing @ night & snores -  wakes up gasping for breath.. Wakes up with sore throat/dry mouth, excessively tired during the day - not rested when waking up.. + COPD. Can dose off at inappropriate times; frequent HA   Simply due to the shear # of complaints & her immediate concern re: palpitations & the murmur, I decided to "table" this discussion until f/u.  Glenetta Hew, MD

## 2016-12-25 ENCOUNTER — Encounter (HOSPITAL_COMMUNITY): Payer: Self-pay

## 2016-12-25 ENCOUNTER — Emergency Department (HOSPITAL_COMMUNITY): Payer: Medicare HMO

## 2016-12-25 ENCOUNTER — Observation Stay (HOSPITAL_COMMUNITY)
Admission: EM | Admit: 2016-12-25 | Discharge: 2016-12-28 | Disposition: A | Discharge status: Home / Self Care | Payer: Medicare HMO | Attending: Internal Medicine | Admitting: Internal Medicine

## 2016-12-25 DIAGNOSIS — E7800 Pure hypercholesterolemia, unspecified: Secondary | ICD-10-CM | POA: Insufficient documentation

## 2016-12-25 DIAGNOSIS — F17200 Nicotine dependence, unspecified, uncomplicated: Secondary | ICD-10-CM | POA: Diagnosis not present

## 2016-12-25 DIAGNOSIS — Z720 Tobacco use: Secondary | ICD-10-CM | POA: Diagnosis not present

## 2016-12-25 DIAGNOSIS — E869 Volume depletion, unspecified: Secondary | ICD-10-CM | POA: Insufficient documentation

## 2016-12-25 DIAGNOSIS — R011 Cardiac murmur, unspecified: Secondary | ICD-10-CM

## 2016-12-25 DIAGNOSIS — R002 Palpitations: Secondary | ICD-10-CM | POA: Insufficient documentation

## 2016-12-25 DIAGNOSIS — I358 Other nonrheumatic aortic valve disorders: Secondary | ICD-10-CM | POA: Diagnosis present

## 2016-12-25 DIAGNOSIS — I421 Obstructive hypertrophic cardiomyopathy: Secondary | ICD-10-CM | POA: Insufficient documentation

## 2016-12-25 DIAGNOSIS — F1010 Alcohol abuse, uncomplicated: Secondary | ICD-10-CM | POA: Diagnosis not present

## 2016-12-25 DIAGNOSIS — R55 Syncope and collapse: Secondary | ICD-10-CM | POA: Diagnosis not present

## 2016-12-25 DIAGNOSIS — R404 Transient alteration of awareness: Secondary | ICD-10-CM | POA: Diagnosis not present

## 2016-12-25 DIAGNOSIS — F1020 Alcohol dependence, uncomplicated: Secondary | ICD-10-CM | POA: Insufficient documentation

## 2016-12-25 DIAGNOSIS — F419 Anxiety disorder, unspecified: Secondary | ICD-10-CM | POA: Insufficient documentation

## 2016-12-25 DIAGNOSIS — F329 Major depressive disorder, single episode, unspecified: Secondary | ICD-10-CM | POA: Diagnosis not present

## 2016-12-25 DIAGNOSIS — Z8249 Family history of ischemic heart disease and other diseases of the circulatory system: Secondary | ICD-10-CM | POA: Diagnosis not present

## 2016-12-25 DIAGNOSIS — R9431 Abnormal electrocardiogram [ECG] [EKG]: Secondary | ICD-10-CM | POA: Insufficient documentation

## 2016-12-25 DIAGNOSIS — I10 Essential (primary) hypertension: Secondary | ICD-10-CM | POA: Diagnosis not present

## 2016-12-25 DIAGNOSIS — J449 Chronic obstructive pulmonary disease, unspecified: Secondary | ICD-10-CM | POA: Insufficient documentation

## 2016-12-25 DIAGNOSIS — K219 Gastro-esophageal reflux disease without esophagitis: Secondary | ICD-10-CM | POA: Insufficient documentation

## 2016-12-25 DIAGNOSIS — R778 Other specified abnormalities of plasma proteins: Secondary | ICD-10-CM | POA: Diagnosis present

## 2016-12-25 DIAGNOSIS — G4700 Insomnia, unspecified: Secondary | ICD-10-CM | POA: Insufficient documentation

## 2016-12-25 DIAGNOSIS — F411 Generalized anxiety disorder: Secondary | ICD-10-CM | POA: Diagnosis present

## 2016-12-25 DIAGNOSIS — M1990 Unspecified osteoarthritis, unspecified site: Secondary | ICD-10-CM | POA: Diagnosis not present

## 2016-12-25 DIAGNOSIS — I350 Nonrheumatic aortic (valve) stenosis: Secondary | ICD-10-CM

## 2016-12-25 DIAGNOSIS — I119 Hypertensive heart disease without heart failure: Secondary | ICD-10-CM | POA: Diagnosis not present

## 2016-12-25 DIAGNOSIS — E876 Hypokalemia: Secondary | ICD-10-CM | POA: Insufficient documentation

## 2016-12-25 DIAGNOSIS — J410 Simple chronic bronchitis: Secondary | ICD-10-CM | POA: Diagnosis not present

## 2016-12-25 DIAGNOSIS — R748 Abnormal levels of other serum enzymes: Secondary | ICD-10-CM | POA: Diagnosis not present

## 2016-12-25 DIAGNOSIS — F17210 Nicotine dependence, cigarettes, uncomplicated: Secondary | ICD-10-CM | POA: Insufficient documentation

## 2016-12-25 DIAGNOSIS — R531 Weakness: Secondary | ICD-10-CM | POA: Diagnosis not present

## 2016-12-25 DIAGNOSIS — F1910 Other psychoactive substance abuse, uncomplicated: Secondary | ICD-10-CM | POA: Diagnosis present

## 2016-12-25 DIAGNOSIS — R7989 Other specified abnormal findings of blood chemistry: Secondary | ICD-10-CM | POA: Insufficient documentation

## 2016-12-25 DIAGNOSIS — IMO0002 Reserved for concepts with insufficient information to code with codable children: Secondary | ICD-10-CM

## 2016-12-25 LAB — BASIC METABOLIC PANEL
Anion gap: 15 (ref 5–15)
Anion gap: 7 (ref 5–15)
BUN: 10 mg/dL (ref 6–20)
BUN: 9 mg/dL (ref 6–20)
CALCIUM: 8.8 mg/dL — AB (ref 8.9–10.3)
CALCIUM: 8.4 mg/dL — AB (ref 8.9–10.3)
CHLORIDE: 104 mmol/L (ref 101–111)
CO2: 23 mmol/L (ref 22–32)
CO2: 29 mmol/L (ref 22–32)
CREATININE: 0.66 mg/dL (ref 0.44–1.00)
CREATININE: 0.6 mg/dL (ref 0.44–1.00)
Chloride: 105 mmol/L (ref 101–111)
GFR calc Af Amer: >60 mL/min (ref >60)
GFR calc Af Amer: >60 mL/min (ref >60)
GFR calc non Af Amer: >60 mL/min (ref >60)
GFR calc non Af Amer: >60 mL/min (ref >60)
GLUCOSE: 108 mg/dL — AB (ref 65–99)
Glucose, Bld: 104 mg/dL — ABNORMAL HIGH (ref 65–99)
Potassium: 2.4 mmol/L — CL (ref 3.5–5.1)
Potassium: 3.6 mmol/L (ref 3.5–5.1)
SODIUM: 142 mmol/L (ref 135–145)
Sodium: 141 mmol/L (ref 135–145)

## 2016-12-25 LAB — CBC
HCT: 41.1 % (ref 36.0–46.0)
Hemoglobin: 15 g/dL (ref 12.0–15.0)
MCH: 33.1 pg (ref 26.0–34.0)
MCHC: 36.5 g/dL — ABNORMAL HIGH (ref 30.0–36.0)
MCV: 90.7 fL (ref 78.0–100.0)
PLATELETS: 124 10*3/uL — AB (ref 150–400)
RBC: 4.53 MIL/uL (ref 3.87–5.11)
RDW: 12.5 % (ref 11.5–15.5)
WBC: 6.3 10*3/uL (ref 4.0–10.5)

## 2016-12-25 LAB — URINALYSIS, ROUTINE W REFLEX MICROSCOPIC
BACTERIA UA: NONE SEEN
Bilirubin Urine: NEGATIVE
Glucose, UA: NEGATIVE mg/dL
Ketones, ur: NEGATIVE mg/dL
Leukocytes, UA: NEGATIVE
Nitrite: NEGATIVE
PH: 7 (ref 5.0–8.0)
Protein, ur: NEGATIVE mg/dL
RBC / HPF: NONE SEEN RBC/hpf (ref 0–5)
SPECIFIC GRAVITY, URINE: 1.003 — AB (ref 1.005–1.030)

## 2016-12-25 LAB — CBG MONITORING, ED: GLUCOSE-CAPILLARY: 106 mg/dL — AB (ref 65–99)

## 2016-12-25 LAB — MAGNESIUM
MAGNESIUM: 1.7 mg/dL (ref 1.7–2.4)
Magnesium: 1.7 mg/dL (ref 1.7–2.4)

## 2016-12-25 LAB — BRAIN NATRIURETIC PEPTIDE: B NATRIURETIC PEPTIDE 5: 86.3 pg/mL (ref 0.0–100.0)

## 2016-12-25 LAB — TSH: TSH: 1.407 u[IU]/mL (ref 0.350–4.500)

## 2016-12-25 LAB — TROPONIN I: TROPONIN I: 0.08 ng/mL — AB (ref <0.03)

## 2016-12-25 MED ORDER — BUSPIRONE HCL 10 MG PO TABS
15.0000 mg | ORAL_TABLET | Freq: Every day | ORAL | Status: DC | PRN
  Administered 2016-12-25 – 2016-12-28 (×3): 15 mg via ORAL
  Filled 2016-12-25: qty 2
  Filled 2016-12-25 (×2): qty 3

## 2016-12-25 MED ORDER — TRAZODONE HCL 50 MG PO TABS
100.0000 mg | ORAL_TABLET | Freq: Every day | ORAL | Status: DC
  Administered 2016-12-25 – 2016-12-27 (×3): 100 mg via ORAL
  Filled 2016-12-25 (×3): qty 2

## 2016-12-25 MED ORDER — POTASSIUM CHLORIDE CRYS ER 20 MEQ PO TBCR
40.0000 meq | EXTENDED_RELEASE_TABLET | Freq: Once | ORAL | Status: AC
  Administered 2016-12-25: 40 meq via ORAL
  Filled 2016-12-25: qty 2

## 2016-12-25 MED ORDER — VITAMIN B-1 100 MG PO TABS
100.0000 mg | ORAL_TABLET | Freq: Every day | ORAL | Status: DC
  Administered 2016-12-26 – 2016-12-28 (×3): 100 mg via ORAL
  Filled 2016-12-25 (×3): qty 1

## 2016-12-25 MED ORDER — NICOTINE 21 MG/24HR TD PT24
21.0000 mg | MEDICATED_PATCH | Freq: Every day | TRANSDERMAL | Status: DC
  Filled 2016-12-25 (×5): qty 1

## 2016-12-25 MED ORDER — THIAMINE HCL 100 MG/ML IJ SOLN
100.0000 mg | Freq: Every day | INTRAMUSCULAR | Status: DC
  Filled 2016-12-25: qty 1

## 2016-12-25 MED ORDER — HEPARIN SODIUM (PORCINE) 5000 UNIT/ML IJ SOLN
5000.0000 [IU] | Freq: Three times a day (TID) | INTRAMUSCULAR | Status: DC
  Administered 2016-12-25 – 2016-12-28 (×5): 5000 [IU] via SUBCUTANEOUS
  Filled 2016-12-25 (×6): qty 1

## 2016-12-25 MED ORDER — SODIUM CHLORIDE 0.9 % IV SOLN
Freq: Once | INTRAVENOUS | Status: AC
  Administered 2016-12-25: 16:00:00 via INTRAVENOUS

## 2016-12-25 MED ORDER — THIAMINE HCL 100 MG/ML IJ SOLN
Freq: Once | INTRAVENOUS | Status: AC
  Administered 2016-12-25: 20:00:00 via INTRAVENOUS
  Filled 2016-12-25: qty 1000

## 2016-12-25 MED ORDER — FOLIC ACID 1 MG PO TABS
1.0000 mg | ORAL_TABLET | Freq: Every day | ORAL | Status: DC
  Administered 2016-12-26 – 2016-12-28 (×3): 1 mg via ORAL
  Filled 2016-12-25 (×3): qty 1

## 2016-12-25 MED ORDER — SODIUM CHLORIDE 0.9 % IV SOLN
INTRAVENOUS | Status: DC
  Administered 2016-12-26 (×2): via INTRAVENOUS

## 2016-12-25 MED ORDER — POTASSIUM CHLORIDE 10 MEQ/100ML IV SOLN
10.0000 meq | INTRAVENOUS | Status: AC
  Administered 2016-12-25 (×4): 10 meq via INTRAVENOUS
  Filled 2016-12-25 (×6): qty 100

## 2016-12-25 MED ORDER — LORAZEPAM 0.5 MG PO TABS
1.0000 mg | ORAL_TABLET | Freq: Four times a day (QID) | ORAL | Status: DC | PRN
  Administered 2016-12-26 – 2016-12-27 (×5): 1 mg via ORAL
  Filled 2016-12-25 (×2): qty 1
  Filled 2016-12-25: qty 2
  Filled 2016-12-25 (×2): qty 1

## 2016-12-25 MED ORDER — ADULT MULTIVITAMIN W/MINERALS CH
1.0000 | ORAL_TABLET | Freq: Every day | ORAL | Status: DC
  Administered 2016-12-26 – 2016-12-28 (×3): 1 via ORAL
  Filled 2016-12-25 (×3): qty 1

## 2016-12-25 MED ORDER — LORAZEPAM 2 MG/ML IJ SOLN: 1.0000 mg | Freq: Four times a day (QID) | INTRAMUSCULAR | Status: DC | PRN

## 2016-12-25 MED ORDER — IPRATROPIUM-ALBUTEROL 0.5-2.5 (3) MG/3ML IN SOLN: 3.0000 mL | RESPIRATORY_TRACT | Status: DC | PRN

## 2016-12-25 NOTE — ED Triage Notes (Signed)
Per EMS, Pt had syncopal episode r/t heat exposure this afternoon.  C/o BUE weakness w/ unknown start time.  Denies pain.  Pt admits to "having a few drinks" while sitting in a kiddie pool.    Pt reports she was seen by Cardiologist yesterday.

## 2016-12-25 NOTE — ED Notes (Signed)
Patient denies pain and is resting comfortably.  

## 2016-12-25 NOTE — ED Provider Notes (Signed)
[facility] DEPT Provider Note   CSN: [id number] Arrival date & time: 12/25/16  1507     History   Chief Complaint Chief Complaint  Patient presents with  . Loss of Consciousness  . Heat Exposure    HPI Sarah Barnes is a 66 y.o. female.  HPI   66 yo F with PMHx COPD, EtOH abuse, anxiety, HTN, HLD here with syncopal episode. Pt was outside at the pool today drinking with friends. She states she felt well upon awakening and was doing well until just prior to her episode. She states she suddenly felt extremely fatigued then passed out. She does not remember anything until she awoke with EMS there. Initial BP 34V systolic per EMS. Pt now c/o generalized weakness, no other complaints. Of note, pt has recently been having increasing palpitations x several weeks. She saw Dr. Ellyn Hack yesterday and has a TTE scheduled as well as Holter monitor, for further assessment of new murmur. Denies any CP currently.  Past Medical History:  Diagnosis Date  . Anemia   . Anxiety   . Arthritis    "all over" (08/13/2016)  . Chronic bronchitis ([place])   . COPD (chronic obstructive pulmonary disease) ([place])   . Depression   . Dyspnea    occ  . Dysrhythmia    palpitations occ due to anxiety  . ETOH abuse   . GERD (gastroesophageal reflux disease)   . Headache    "daily til I got glasses; now have headache once in awhile" (08/13/2016)  . Hepatitis 1960   "isolated &  hospitalized for 2 weeks; don't know which kind of hepatitis"   . Hernia, hiatal   . High cholesterol   . Hypertension   . Pneumonia    "once or twice" (08/13/2016)  . PONV (postoperative nausea and vomiting)   . Sciatica     Patient Active Problem List   Diagnosis Date Noted  . Syncope and collapse 12/25/2016  . Heart palpitations - PACs 12/24/2016  . Aortic systolic murmur on examination 12/24/2016  . Shortness of breath at rest 12/24/2016  . Ventral hernia 08/13/2016  . Panic disorder with agoraphobia 07/13/2015  .  Major depressive disorder, recurrent severe without psychotic features ([place]) 07/12/2015  . Tobacco use disorder 07/12/2015  . Alcohol use disorder, severe, dependence ([place]) 07/12/2015  . Knee pain, bilateral 09/17/2014  . Generalized anxiety disorder 05/17/2014  . Hyperlipidemia 05/05/2014  . GERD (gastroesophageal reflux disease) 05/05/2014  . Essential hypertension 05/05/2014    Past Surgical History:  Procedure Laterality Date  . ANAL FISTULECTOMY  1970s X 3  . BACK SURGERY    . HERNIA REPAIR    . KNEE ARTHROSCOPY Right   . LAPAROSCOPIC CHOLECYSTECTOMY    . LAPAROSCOPIC INCISIONAL / UMBILICAL / VENTRAL HERNIA REPAIR  08/13/2016   VHR w/mesh  . LUMBAR DISC SURGERY     "herniated disc"  . NASAL FRACTURE SURGERY  1970s X 2  . SHOULDER ARTHROSCOPY WITH ROTATOR CUFF REPAIR Right 1990s?  . TONSILLECTOMY  1951  . VENTRAL HERNIA REPAIR N/A 08/13/2016   Procedure: LAPAROSCOPIC VENTRAL HERNIA REPAIR WITH MESH;  Surgeon: Judeth Horn, MD;  Location: [facility];  Service: General;  Laterality: N/A;    OB History    No data available       Home Medications    Prior to Admission medications   Medication Sig Start Date End Date Taking? Authorizing Provider  busPIRone (BUSPAR) 15 MG tablet Take 1 tablet (15 mg total) by mouth  2 (two) times daily. Patient taking differently: Take 15 mg by mouth daily as needed (anxiety).  10/11/16  Yes Saguier, Percell Miller, PA-C  furosemide (LASIX) 40 MG tablet TAKE 1 TABLET EVERY DAY 11/06/16  Yes Saguier, Percell Miller, PA-C  prazosin (MINIPRESS) 2 MG capsule TAKE 1 CAPSULE EVERY MORNING 10/30/16  Yes Saguier, Percell Miller, PA-C  traZODone (DESYREL) 100 MG tablet TAKE 1 TABLET AT BEDTIME Patient taking differently: TAKE 1 TABLET by mouth every morning for anxiety 06/25/16  Yes Saguier, Percell Miller, PA-C    Family History Family History  Problem Relation Age of Onset  . Diabetes Mother   . Hyperlipidemia Mother   . Hypertension Mother   . Angina Mother   . Hypertension Father     . Stroke Father     Social History Social History  Substance Use Topics  . Smoking status: Current Every Day Smoker    Packs/day: 0.50    Years: 49.00    Types: Cigarettes  . Smokeless tobacco: Never Used  . Alcohol use 50.4 oz/week    36 Cans of beer, 48 Shots of liquor per week     Comment: 08/13/2016 "couple beers-1 quart vodka daily; depends on my mood; 3, 12 packs of beer/week 1 1/2 quarts vodka/week""     Allergies   Patient has no known allergies.   Review of Systems Review of Systems  Constitutional: Positive for fatigue. Negative for chills and fever.  HENT: Negative for congestion and rhinorrhea.   Eyes: Negative for visual disturbance.  Respiratory: Negative for cough, shortness of breath and wheezing.   Cardiovascular: Positive for palpitations. Negative for chest pain and leg swelling.  Gastrointestinal: Negative for abdominal pain, diarrhea, nausea and vomiting.  Genitourinary: Negative for dysuria and flank pain.  Musculoskeletal: Negative for neck pain and neck stiffness.  Skin: Negative for rash and wound.  Allergic/Immunologic: Negative for immunocompromised state.  Neurological: Positive for weakness. Negative for syncope and headaches.  All other systems reviewed and are negative.    Physical Exam Updated Vital Signs BP 134/89 (BP Location: Right Arm)   Pulse 78   Temp 97.8 F (36.6 C) (Oral)   Resp (!) 21   Ht 5\' 3"  (1.6 m)   Wt 86.2 kg (190 lb 0.6 oz)   SpO2 97%   BMI 33.66 kg/m   Physical Exam  Constitutional: She is oriented to person, place, and time. She appears well-developed and well-nourished. No distress.  HENT:  Head: Normocephalic and atraumatic.  Eyes: Conjunctivae are normal.  Neck: Neck supple.  Cardiovascular: Normal rate and regular rhythm.  Exam reveals no friction rub.   Murmur heard.  Crescendo decrescendo systolic murmur is present with a grade of 5/6  Pulmonary/Chest: Effort normal and breath sounds normal. No  respiratory distress. She has no wheezes. She has no rales.  Abdominal: She exhibits no distension.  Musculoskeletal: She exhibits no edema.  Neurological: She is alert and oriented to person, place, and time. She has normal strength. No cranial nerve deficit or sensory deficit. She exhibits normal muscle tone. GCS eye subscore is 4. GCS verbal subscore is 5. GCS motor subscore is 6.  Skin: Skin is warm. Capillary refill takes less than 2 seconds.  Psychiatric: She has a normal mood and affect.  Nursing note and vitals reviewed.    ED Treatments / Results  Labs (all labs ordered are listed, but only abnormal results are displayed) Labs Reviewed  BASIC METABOLIC PANEL - Abnormal; Notable for the following:  Result Value   Potassium 2.4 (*)    Glucose, Bld 104 (*)    Calcium 8.8 (*)    All other components within normal limits  CBC - Abnormal; Notable for the following:    MCHC 36.5 (*)    Platelets 124 (*)    All other components within normal limits  URINALYSIS, ROUTINE W REFLEX MICROSCOPIC - Abnormal; Notable for the following:    Color, Urine STRAW (*)    Specific Gravity, Urine 1.003 (*)    Hgb urine dipstick MODERATE (*)    Squamous Epithelial / LPF 0-5 (*)    All other components within normal limits  TROPONIN I - Abnormal; Notable for the following:    Troponin I 0.08 (*)    All other components within normal limits  BASIC METABOLIC PANEL - Abnormal; Notable for the following:    Glucose, Bld 108 (*)    Calcium 8.4 (*)    All other components within normal limits  CBG MONITORING, ED - Abnormal; Notable for the following:    Glucose-Capillary 106 (*)    All other components within normal limits  MAGNESIUM  BRAIN NATRIURETIC PEPTIDE  MAGNESIUM  TROPONIN I  TROPONIN I  TSH  COMPREHENSIVE METABOLIC PANEL  CBC  PROTIME-INR  APTT  I-STAT TROPONIN, ED    EKG  EKG Interpretation None      Normal sinus rhythm, ventricular rate 83. PR 156, QRS 95, QTC 518.  Prolonged QT interval but otherwise no ST or T-segment changes. No evidence of acute ischemia or infarct.  Radiology Dg Chest Portable 1 View  Result Date: 12/25/2016 CLINICAL DATA:  Syncope EXAM: PORTABLE CHEST 1 VIEW COMPARISON:  December 17, 2016 FINDINGS: There is no edema or consolidation. Heart is borderline enlarged with pulmonary vascularity within normal limits. No adenopathy. There are old healed rib fractures on the right. IMPRESSION: No edema or consolidation.  Borderline cardiac enlargement. Electronically Signed   By: Lowella Grip III M.D.   On: 12/25/2016 15:58    Procedures Procedures (including critical care time)  Medications Ordered in ED Medications  potassium chloride 10 mEq in 100 mL IVPB (10 mEq Intravenous New Bag/Given 12/25/16 2248)  traZODone (DESYREL) tablet 100 mg (100 mg Oral Given 12/25/16 2247)  busPIRone (BUSPAR) tablet 15 mg (15 mg Oral Given 12/25/16 2005)  LORazepam (ATIVAN) tablet 1 mg (not administered)    Or  LORazepam (ATIVAN) injection 1 mg (not administered)  thiamine (VITAMIN B-1) tablet 100 mg (not administered)    Or  thiamine (B-1) injection 100 mg (not administered)  folic acid (FOLVITE) tablet 1 mg (not administered)  multivitamin with minerals tablet 1 tablet (not administered)  heparin injection 5,000 Units (5,000 Units Subcutaneous Given 12/25/16 2248)  0.9 %  sodium chloride infusion (not administered)  nicotine (NICODERM CQ - dosed in mg/24 hours) patch 21 mg (21 mg Transdermal Not Given 12/25/16 2006)  ipratropium-albuterol (DUONEB) 0.5-2.5 (3) MG/3ML nebulizer solution 3 mL (not administered)  0.9 %  sodium chloride infusion ( Intravenous Stopped 12/25/16 1720)  potassium chloride SA (K-DUR,KLOR-CON) CR tablet 40 mEq (40 mEq Oral Given 12/25/16 1802)  sodium chloride 0.9 % 1,000 mL with thiamine 102 mg, folic acid 1 mg, multivitamins adult 10 mL infusion ( Intravenous New Bag/Given 12/25/16 2003)     Initial Impression / Assessment and Plan /  ED Course  I have reviewed the triage vital signs and the nursing notes.  Pertinent labs & imaging results that were available during my care of  the patient were reviewed by me and considered in my medical decision making (see chart for details).     66 year old female here with syncopal episode after sitting outside in the heat. On exam, patient has significant, blowing heart murmur with palpable thrill. Labwork shows significant hypokalemia and negative BNP. Troponin mildly elevated at 0.08, which I suspect is secondary to demand and EKG shows no acute ischemia. Concern for possible syncopal episode secondary to significant valvular disease versus transient arrhythmia. Will admit for observation and echocardiogram. No signs of ACS at this time. No LE edema or s/s to suggest DVT but should eval for signs of right heart strain on TTE/work-up accordingly.  Final Clinical Impressions(s) / ED Diagnoses   Final diagnoses:  Syncope, unspecified syncope type  Heart murmur  Hypokalemia    New Prescriptions Current Discharge Medication List       Duffy Bruce, MD 12/25/16 2302

## 2016-12-25 NOTE — Progress Notes (Signed)
CRITICAL VALUE ALERT  Critical Value:  Troponin 0.08  Date & Time Notied:  12/25/16 2045  Provider Notified: Hal Hope   Orders Received/Actions taken: No new orders received. Will continue to monitor pt.

## 2016-12-25 NOTE — H&P (Signed)
History and Physical    [name] ASN:[id number] DOB: 1951-04-10 DOA: 12/25/2016  PCP: Mackie Pai, PA-C Patient coming from: Home  Chief Complaint: Home  HPI: Sarah Barnes is a 66 y.o. female with medical history significant of alcohol use, COPD, anxiety, depression, tobacco use, hypertension came to the ER after having an episode of syncope. Patient states she has been having symptoms of intermittent palpitations for past several months and along with feeling of generalized weakness. Due to these complaints she saw Dr. Ellyn Hack yesterday and she was scheduled to get an echocardiogram and Holter monitor in next 2 weeks. During the visit she was also diagnosed with new onset loud systolic murmur suspicious for aortic stenosis. This afternoon when she was sitting outside in [place] she had an episode of syncope/LOC. During this time she was having her first alcoholic beverage today and did not even finish before she syncopized. She states prior to her syncope she told her sister that she feels very weak and wants to go to bed. She remembers is waking up in an ambulance without any prolonged postictal moment. She denied any bowel or bladder incontinence, any other presyncopal warning signs, tongue biting or any other seizure type of symptoms. She denies having similar symptoms in the past. Denies any recent medication changes. She admits of drinking half a bottle of vodka daily for over 20 years. She smokes 1.5 pack of cigarettes daily and occasionally smokes marijuana.  In the ER her mental status was noted to be at baseline and she did not have any further symptoms. On routine labs she was noted to have hypokalemia - 2.4 and magnesium 1.7. She was ordered for 40 oral and 40 IV potassium along with IV fluids. Medicine team was consulted for admission.   Review of Systems: As per HPI otherwise 10 point review of systems negative.   Past Medical History:  Diagnosis Date  .  Anemia   . Anxiety   . Arthritis    "all over" (08/13/2016)  . Chronic bronchitis ([place])   . COPD (chronic obstructive pulmonary disease) ([place])   . Depression   . Dyspnea    occ  . Dysrhythmia    palpitations occ due to anxiety  . ETOH abuse   . GERD (gastroesophageal reflux disease)   . Headache    "daily til I got glasses; now have headache once in awhile" (08/13/2016)  . Hepatitis 1960   "isolated &  hospitalized for 2 weeks; don't know which kind of hepatitis"   . Hernia, hiatal   . High cholesterol   . Hypertension   . Pneumonia    "once or twice" (08/13/2016)  . PONV (postoperative nausea and vomiting)   . Sciatica     Past Surgical History:  Procedure Laterality Date  . ANAL FISTULECTOMY  1970s X 3  . BACK SURGERY    . HERNIA REPAIR    . KNEE ARTHROSCOPY Right   . LAPAROSCOPIC CHOLECYSTECTOMY    . LAPAROSCOPIC INCISIONAL / UMBILICAL / VENTRAL HERNIA REPAIR  08/13/2016   VHR w/mesh  . LUMBAR DISC SURGERY     "herniated disc"  . NASAL FRACTURE SURGERY  1970s X 2  . SHOULDER ARTHROSCOPY WITH ROTATOR CUFF REPAIR Right 1990s?  . TONSILLECTOMY  1951  . VENTRAL HERNIA REPAIR N/A 08/13/2016   Procedure: LAPAROSCOPIC VENTRAL HERNIA REPAIR WITH MESH;  Surgeon: Judeth Horn, MD;  Location: [facility];  Service: General;  Laterality: N/A;     reports that  she has been smoking Cigarettes.  She has a 24.50 pack-year smoking history. She has never used smokeless tobacco. She reports that she drinks about 50.4 oz of alcohol per week . She reports that she uses drugs, including Marijuana, about 1 time per week.  No Known Allergies  Family History  Problem Relation Age of Onset  . Diabetes Mother   . Hyperlipidemia Mother   . Hypertension Mother   . Angina Mother   . Hypertension Father   . Stroke Father     Acceptable: Family history reviewed and not pertinent (If you reviewed it)  Prior to Admission medications   Medication Sig Start Date End Date Taking? Authorizing Provider   busPIRone (BUSPAR) 15 MG tablet Take 1 tablet (15 mg total) by mouth 2 (two) times daily. Patient taking differently: Take 15 mg by mouth daily as needed (anxiety).  10/11/16  Yes Saguier, Percell Miller, PA-C  furosemide (LASIX) 40 MG tablet TAKE 1 TABLET EVERY DAY 11/06/16  Yes Saguier, Percell Miller, PA-C  prazosin (MINIPRESS) 2 MG capsule TAKE 1 CAPSULE EVERY MORNING 10/30/16  Yes Saguier, Percell Miller, PA-C  traZODone (DESYREL) 100 MG tablet TAKE 1 TABLET AT BEDTIME Patient taking differently: TAKE 1 TABLET by mouth every morning for anxiety 06/25/16  Yes Elise Benne    Physical Exam: Vitals:   12/25/16 1630 12/25/16 1700 12/25/16 1730 12/25/16 1800  BP: 123/69 137/74 137/80 (!) 126/96  Pulse: 80 77 78 83  Resp: 18 18 (!) 23 (!) 21  Temp:      TempSrc:      SpO2: 91% 100% 91% 93%  Weight:      Height:          Constitutional: NAD, calm, comfortable Vitals:   12/25/16 1630 12/25/16 1700 12/25/16 1730 12/25/16 1800  BP: 123/69 137/74 137/80 (!) 126/96  Pulse: 80 77 78 83  Resp: 18 18 (!) 23 (!) 21  Temp:      TempSrc:      SpO2: 91% 100% 91% 93%  Weight:      Height:       Eyes: PERRL, lids and conjunctivae normal ENMT: Mucous membranes are moist. Posterior pharynx clear of any exudate or lesions.Normal dentition.  Neck: normal, supple, no masses, no thyromegaly Respiratory: clear to auscultation bilaterally, no wheezing, no crackles. Normal respiratory effort. No accessory muscle use.  Cardiovascular: Regular rate and rhythm, no murmurs / rubs / gallops. No extremity edema. 2+ pedal pulses. No carotid bruits.  Abdomen: no tenderness, no masses palpated. No hepatosplenomegaly. Bowel sounds positive.  Musculoskeletal: no clubbing / cyanosis. No joint deformity upper and lower extremities. Good ROM, no contractures. Normal muscle tone.  Skin: no rashes, lesions, ulcers. No induration Neurologic: CN 2-12 grossly intact. Sensation intact, DTR normal. Strength 5/5 in all 4.  Psychiatric:  Normal judgment and insight. Alert and oriented x 3. Normal mood.     Labs on Admission: I have personally reviewed following labs and imaging studies  CBC:  Recent Labs Lab 12/25/16 1604  WBC 6.3  HGB 15.0  HCT 41.1  MCV 90.7  PLT 502*   Basic Metabolic Panel:  Recent Labs Lab 12/25/16 1604  NA 142  K 2.4*  CL 104  CO2 23  GLUCOSE 104*  BUN 10  CREATININE 0.66  CALCIUM 8.8*  MG 1.7   GFR: Estimated Creatinine Clearance: 70 mL/min (by C-G formula based on SCr of 0.66 mg/dL). Liver Function Tests: No results for input(s): AST, ALT, ALKPHOS, BILITOT, PROT, ALBUMIN in  the last 168 hours. No results for input(s): LIPASE, AMYLASE in the last 168 hours. No results for input(s): AMMONIA in the last 168 hours. Coagulation Profile: No results for input(s): INR, PROTIME in the last 168 hours. Cardiac Enzymes: No results for input(s): CKTOTAL, CKMB, CKMBINDEX, TROPONINI in the last 168 hours. BNP (last 3 results) No results for input(s): PROBNP in the last 8760 hours. HbA1C: No results for input(s): HGBA1C in the last 72 hours. CBG:  Recent Labs Lab 12/25/16 1620  GLUCAP 106*   Lipid Profile: No results for input(s): CHOL, HDL, LDLCALC, TRIG, CHOLHDL, LDLDIRECT in the last 72 hours. Thyroid Function Tests: No results for input(s): TSH, T4TOTAL, FREET4, T3FREE, THYROIDAB in the last 72 hours. Anemia Panel: No results for input(s): VITAMINB12, FOLATE, FERRITIN, TIBC, IRON, RETICCTPCT in the last 72 hours. Urine analysis:    Component Value Date/Time   COLORURINE STRAW (A) 12/25/2016 1526   APPEARANCEUR CLEAR 12/25/2016 1526   LABSPEC 1.003 (L) 12/25/2016 1526   PHURINE 7.0 12/25/2016 1526   GLUCOSEU NEGATIVE 12/25/2016 1526   HGBUR MODERATE (A) 12/25/2016 1526   BILIRUBINUR NEGATIVE 12/25/2016 1526   KETONESUR NEGATIVE 12/25/2016 1526   PROTEINUR NEGATIVE 12/25/2016 1526   UROBILINOGEN 0.2 11/25/2014 0415   NITRITE NEGATIVE 12/25/2016 1526   LEUKOCYTESUR  NEGATIVE 12/25/2016 1526   Sepsis Labs: !!!!!!!!!!!!!!!!!!!!!!!!!!!!!!!!!!!!!!!!!!!! @LABRCNTIP (procalcitonin:4,lacticidven:4) )No results found for this or any previous visit (from the past 240 hour(s)).   Radiological Exams on Admission: Dg Chest Portable 1 View  Result Date: 12/25/2016 CLINICAL DATA:  Syncope EXAM: PORTABLE CHEST 1 VIEW COMPARISON:  December 17, 2016 FINDINGS: There is no edema or consolidation. Heart is borderline enlarged with pulmonary vascularity within normal limits. No adenopathy. There are old healed rib fractures on the right. IMPRESSION: No edema or consolidation.  Borderline cardiac enlargement. Electronically Signed   By: Lowella Grip III M.D.   On: 12/25/2016 15:58    EKG: Ordered  Assessment/Plan Active Problems:   Syncope and collapse    Syncope and collapse Palpitations -Likely secondary to cardiac in origin. Low suspicion for neurologic in nature -Trend cardiac enzymes (note yet done in ED), check TSH, EKG -Bedrest. IV fluids. Monitor urine output -Echocardiogram ordered -Supportive care. Monitor on telemetry  Grade V/VI loud systolic murmur heard and second costal space Suspicion for aortic stenosis  -Echocardiogram ordered -Due to murmur being likely causing her syncope symptoms we will consult cardiology in the morning. If necessary will call them tonight  Hypokalemia -Magnesium is 1.7 -Potassium chloride 69mEq given orally in the ER and 40 IV -Repeat labs ordered for tonight around 10 PM. Will replete more as needed  Alcohol abuse Tobacco use -We'll start patient on CIWA protocol. Order banana bag -Vitamin supplement started -Counseled to quit using alcohol and tobacco -Nicotine patch transdermal  History of hypertension -Hold off on home dose of prazosin and Lasix  History of anxiety and insomnia -On home buspirone as needed and trazodone at bedtime  DVT prophylaxis: Subcutaneous heparin Code Status: Full code Family  Communication: None at bedside, patient comprehends well Disposition Plan: To be determined Consults called: Cardiology consult tomorrow morning. Call at night if necessary. Admission status: Observation status for now   Janesia Joswick Arsenio Loader MD [facility]   If 7PM-7AM, please contact night-coverage [email or web address] Password [email or web address]  12/25/2016, 6:05 PM

## 2016-12-25 NOTE — ED Notes (Signed)
Call report to [facility] at [phone] at [phone].

## 2016-12-26 ENCOUNTER — Encounter (HOSPITAL_COMMUNITY): Payer: Self-pay | Admitting: Cardiology

## 2016-12-26 ENCOUNTER — Encounter: Payer: Self-pay | Admitting: Cardiology

## 2016-12-26 ENCOUNTER — Observation Stay (HOSPITAL_BASED_OUTPATIENT_CLINIC_OR_DEPARTMENT_OTHER): Payer: Medicare HMO

## 2016-12-26 DIAGNOSIS — R011 Cardiac murmur, unspecified: Secondary | ICD-10-CM | POA: Diagnosis not present

## 2016-12-26 DIAGNOSIS — R748 Abnormal levels of other serum enzymes: Secondary | ICD-10-CM

## 2016-12-26 DIAGNOSIS — R7989 Other specified abnormal findings of blood chemistry: Secondary | ICD-10-CM | POA: Diagnosis present

## 2016-12-26 DIAGNOSIS — I421 Obstructive hypertrophic cardiomyopathy: Secondary | ICD-10-CM | POA: Diagnosis not present

## 2016-12-26 DIAGNOSIS — R9431 Abnormal electrocardiogram [ECG] [EKG]: Secondary | ICD-10-CM | POA: Diagnosis present

## 2016-12-26 DIAGNOSIS — Z720 Tobacco use: Secondary | ICD-10-CM | POA: Diagnosis not present

## 2016-12-26 DIAGNOSIS — I350 Nonrheumatic aortic (valve) stenosis: Secondary | ICD-10-CM | POA: Diagnosis not present

## 2016-12-26 DIAGNOSIS — J410 Simple chronic bronchitis: Secondary | ICD-10-CM | POA: Diagnosis not present

## 2016-12-26 DIAGNOSIS — F1010 Alcohol abuse, uncomplicated: Secondary | ICD-10-CM | POA: Diagnosis not present

## 2016-12-26 DIAGNOSIS — F1910 Other psychoactive substance abuse, uncomplicated: Secondary | ICD-10-CM

## 2016-12-26 DIAGNOSIS — R55 Syncope and collapse: Secondary | ICD-10-CM | POA: Diagnosis not present

## 2016-12-26 DIAGNOSIS — I119 Hypertensive heart disease without heart failure: Secondary | ICD-10-CM | POA: Diagnosis not present

## 2016-12-26 DIAGNOSIS — I358 Other nonrheumatic aortic valve disorders: Secondary | ICD-10-CM | POA: Diagnosis not present

## 2016-12-26 DIAGNOSIS — I361 Nonrheumatic tricuspid (valve) insufficiency: Secondary | ICD-10-CM

## 2016-12-26 DIAGNOSIS — I10 Essential (primary) hypertension: Secondary | ICD-10-CM | POA: Diagnosis not present

## 2016-12-26 DIAGNOSIS — F1020 Alcohol dependence, uncomplicated: Secondary | ICD-10-CM | POA: Diagnosis not present

## 2016-12-26 DIAGNOSIS — J449 Chronic obstructive pulmonary disease, unspecified: Secondary | ICD-10-CM | POA: Diagnosis not present

## 2016-12-26 DIAGNOSIS — R778 Other specified abnormalities of plasma proteins: Secondary | ICD-10-CM | POA: Diagnosis present

## 2016-12-26 DIAGNOSIS — F411 Generalized anxiety disorder: Secondary | ICD-10-CM | POA: Diagnosis not present

## 2016-12-26 LAB — COMPREHENSIVE METABOLIC PANEL
ALK PHOS: 56 U/L (ref 38–126)
ALT: 19 U/L (ref 14–54)
AST: 27 U/L (ref 15–41)
Albumin: 3.5 g/dL (ref 3.5–5.0)
Anion gap: 9 (ref 5–15)
BUN: 10 mg/dL (ref 6–20)
CALCIUM: 8.3 mg/dL — AB (ref 8.9–10.3)
CHLORIDE: 107 mmol/L (ref 101–111)
CO2: 26 mmol/L (ref 22–32)
CREATININE: 0.53 mg/dL (ref 0.44–1.00)
GFR calc Af Amer: >60 mL/min (ref >60)
Glucose, Bld: 94 mg/dL (ref 65–99)
Potassium: 3.6 mmol/L (ref 3.5–5.1)
Sodium: 142 mmol/L (ref 135–145)
Total Bilirubin: 0.6 mg/dL (ref 0.3–1.2)
Total Protein: 6.4 g/dL — ABNORMAL LOW (ref 6.5–8.1)

## 2016-12-26 LAB — PROTIME-INR
INR: 1.03
PROTHROMBIN TIME: 13.5 s (ref 11.4–15.2)

## 2016-12-26 LAB — CBC
HCT: 38.1 % (ref 36.0–46.0)
Hemoglobin: 13.6 g/dL (ref 12.0–15.0)
MCH: 32.1 pg (ref 26.0–34.0)
MCHC: 35.7 g/dL (ref 30.0–36.0)
MCV: 89.9 fL (ref 78.0–100.0)
PLATELETS: 183 10*3/uL (ref 150–400)
RBC: 4.24 MIL/uL (ref 3.87–5.11)
RDW: 12.6 % (ref 11.5–15.5)
WBC: 9.5 10*3/uL (ref 4.0–10.5)

## 2016-12-26 LAB — APTT: aPTT: 28 seconds (ref 24–36)

## 2016-12-26 LAB — ECHOCARDIOGRAM COMPLETE
Height: 63 in
Weight: 3121.71 oz

## 2016-12-26 LAB — TROPONIN I
TROPONIN I: 0.13 ng/mL — AB (ref <0.03)
Troponin I: 0.19 ng/mL (ref <0.03)

## 2016-12-26 LAB — GLUCOSE, CAPILLARY: GLUCOSE-CAPILLARY: 86 mg/dL (ref 65–99)

## 2016-12-26 MED ORDER — ASPIRIN EC 81 MG PO TBEC
81.0000 mg | DELAYED_RELEASE_TABLET | Freq: Every day | ORAL | Status: DC
  Administered 2016-12-26 – 2016-12-28 (×3): 81 mg via ORAL
  Filled 2016-12-26 (×3): qty 1

## 2016-12-26 MED ORDER — SODIUM CHLORIDE 0.9 % IV SOLN: 250.0000 mL | INTRAVENOUS | Status: DC | PRN

## 2016-12-26 MED ORDER — SODIUM CHLORIDE 0.9 % WEIGHT BASED INFUSION
3.0000 mL/kg/h | INTRAVENOUS | Status: DC
  Administered 2016-12-27: 3 mL/kg/h via INTRAVENOUS

## 2016-12-26 MED ORDER — IRBESARTAN 150 MG PO TABS
150.0000 mg | ORAL_TABLET | Freq: Every day | ORAL | Status: DC
  Administered 2016-12-26: 150 mg via ORAL
  Filled 2016-12-26: qty 1

## 2016-12-26 MED ORDER — SODIUM CHLORIDE 0.9 % WEIGHT BASED INFUSION: 1.0000 mL/kg/h | INTRAVENOUS | Status: DC

## 2016-12-26 MED ORDER — LORAZEPAM 2 MG/ML IJ SOLN
1.0000 mg | Freq: Once | INTRAMUSCULAR | Status: DC
Start: 2016-12-26 — End: 2016-12-28

## 2016-12-26 MED ORDER — POTASSIUM CHLORIDE CRYS ER 20 MEQ PO TBCR
40.0000 meq | EXTENDED_RELEASE_TABLET | Freq: Once | ORAL | Status: AC
  Administered 2016-12-26: 40 meq via ORAL
  Filled 2016-12-26: qty 2

## 2016-12-26 MED ORDER — LORAZEPAM 2 MG/ML IJ SOLN
2.0000 mg | Freq: Once | INTRAMUSCULAR | Status: AC
  Administered 2016-12-26: 2 mg via INTRAVENOUS
  Filled 2016-12-26: qty 1

## 2016-12-26 MED ORDER — HYDRALAZINE HCL 20 MG/ML IJ SOLN
10.0000 mg | Freq: Once | INTRAMUSCULAR | Status: AC
  Administered 2016-12-26: 10 mg via INTRAVENOUS
  Filled 2016-12-26: qty 0.5

## 2016-12-26 MED ORDER — METOPROLOL TARTRATE 25 MG PO TABS
12.5000 mg | ORAL_TABLET | Freq: Two times a day (BID) | ORAL | Status: DC
  Administered 2016-12-26 – 2016-12-27 (×3): 12.5 mg via ORAL
  Filled 2016-12-26 (×3): qty 1

## 2016-12-26 MED ORDER — HYDRALAZINE HCL 20 MG/ML IJ SOLN
10.0000 mg | INTRAMUSCULAR | Status: DC | PRN
  Administered 2016-12-26 (×2): 10 mg via INTRAVENOUS
  Administered 2016-12-27: 20:00:00 via INTRAVENOUS
  Administered 2016-12-27 (×2): 10 mg via INTRAVENOUS
  Filled 2016-12-26: qty 0.5
  Filled 2016-12-26: qty 1
  Filled 2016-12-26 (×2): qty 0.5

## 2016-12-26 NOTE — Care Management Note (Signed)
Case Management Note  Patient Details  Name: PADME ARRIAGA MRN: [id number] Date of Birth: 02-24-1951  Subjective/Objective:  66 y/o f admitted w/Syncope. From home.                  Action/Plan:d/c plan home.   Expected Discharge Date:                  Expected Discharge Plan:  Home/Self Care  In-House Referral:     Discharge planning Services  CM Consult  Post Acute Care Choice:    Choice offered to:     DME Arranged:    DME Agency:     HH Arranged:    HH Agency:     Status of Service:  In process, will continue to follow  If discussed at Long Length of Stay Meetings, dates discussed:    Additional Comments:  Dessa Phi, RN 12/26/2016, 1:23 PM

## 2016-12-26 NOTE — Care Management Obs Status (Signed)
[facility] NOTIFICATION   Patient Details  Name: Sarah Barnes MRN: [id number] Date of Birth: 1950/12/14   Medicare Observation Status Notification Given:  Yes    Dessa Phi, RN 12/26/2016, 1:23 PM

## 2016-12-26 NOTE — Progress Notes (Signed)
  Echocardiogram 2D Echocardiogram has been performed.  Sarah Barnes 12/26/2016, 2:22 PM

## 2016-12-26 NOTE — Progress Notes (Signed)
On call MD added orders for hydralazine 10 mg Q4 PRN. Dispensed by main pharmacy, unable to administer medication. Notified pharmacy for dosage. Will make day RN aware.

## 2016-12-26 NOTE — Plan of Care (Signed)
Problem: Education: Goal: Knowledge of [facility] Education information/materials will improve Outcome: Progressing .  Problem: Safety: Goal: Ability to remain free from injury will improve Outcome: Progressing On fall precautions.    Problem: Health Behavior/Discharge Planning: Goal: Ability to manage health-related needs will improve Outcome: Progressing .  Problem: Pain Managment: Goal: General experience of comfort will improve Outcome: Progressing Denies pain at this time.   Problem: Physical Regulation: Goal: Ability to maintain clinical measurements within normal limits will improve Outcome: Progressing . Goal: Will remain free from infection Outcome: Completed/Met Date Met: 12/26/16 .  Problem: Skin Integrity: Goal: Risk for impaired skin integrity will decrease Outcome: Progressing .  Problem: Tissue Perfusion: Goal: Risk factors for ineffective tissue perfusion will decrease Outcome: Progressing .  Problem: Activity: Goal: Risk for activity intolerance will decrease Outcome: Progressing .  Problem: Fluid Volume: Goal: Ability to maintain a balanced intake and output will improve Outcome: Progressing .  Problem: Education: Goal: Ability to state activities that reduce stress will improve Outcome: Progressing .  Problem: Coping: Goal: Ability to identify and develop effective coping behavior will improve Outcome: Progressing .

## 2016-12-26 NOTE — Assessment & Plan Note (Signed)
Murmur sound like at least Mod AS - Plan 2 D Echo to assess.

## 2016-12-26 NOTE — Assessment & Plan Note (Signed)
March 2017 - admitted for EtOH & Cocaine abuse. Just 12/17/16 -> High EtOH level.   Clearly related to Psychological condition. -- H/o Cocaine did not come up in our discussion (not noted on her intake form). -- makes Beta Blocker a poor option.

## 2016-12-26 NOTE — Progress Notes (Signed)
PROGRESS NOTE    Sarah Barnes  MVH:[id number] DOB: [birth year] DOA: 12/25/2016 PCP: Elise Benne   Brief Narrative:  66 year old female with history of alcohol abuse, tobacco abuse, COPD, anxiety and hypertension was admitted to the hospital for evaluation of syncope. She was recently diagnosed with new murmur which is likely moderate to severe aortic stenosis versus mitral regurgitation. Admitted for further workup. Cardiology consulted. Sees also on alcohol withdrawal protocol.   Assessment & Plan:   Active Problems:   Essential hypertension   Generalized anxiety disorder   Tobacco use disorder   Substance abuse   Aortic systolic murmur on examination   Syncope and collapse   Abnormal EKG   Troponin level elevated   Syncope and collapse Palpitations -Cardiac enzymes peaked at 0.19. Likely cardiac in origin -TSH- 1.4 -Give IV fluids, monitor urine output -Continue telemetry monitoring. Echocardiogram has been ordered to still pending at this time  Grade V/VI systolic murmur Suspicion for moderate to severe aortic stenosis versus mitral regurgitation -Follow-up echocardiogram. Closely monitor for preload and avoid hypotension.  Elevated troponin -Likely demand in nature. Peaked at 0.19 -Plans for left heart catheterization per cardiology tomorrow -Beta blocker has been started by cardiology.  Elevated blood pressure History of hypertension -Partly also driven by anxiety therefore she is ordered for IV hydralazine. -Lopressor 12.5 mg orally started today. Avapro 150 mg daily started -IV hydralazine when necessary  Alcohol abuse Tobacco use -CIWA -Counseled to quit using alcohol and tobacco -Nicotine patch transdermal  History of hypertension -Hold off on home dose of prazosin and Lasix  History of anxiety and insomnia -On home buspirone as needed and trazodone at bedtime -CIWA in place   DVT prophylaxis: Subcutaneous heparin Code Status: Full  code Family Communication:  Sister at bedside Disposition Plan: To be determined. She is going to need left heart cath tomorrow per cardiology  Consultants:   Cardiology  Procedures:   None  Antimicrobials:   None   Subjective: Patient is extremely anxious this morning but unable to tell me exactly what's making her anxious. She is curious to know her diagnosis and feels a little afraid about this new diagnosis of murmur. No other complaints including chest pain, shortness of breath, lightheadedness, dizziness,and other complaints  Objective: Vitals:   12/26/16 0858 12/26/16 1040 12/26/16 1159 12/26/16 1200  BP: (!) 202/99 (!) 185/94  (!) 179/90  Pulse:   85   Resp:      Temp:      TempSrc:      SpO2:      Weight:      Height:        Intake/Output Summary (Last 24 hours) at 12/26/16 1322 Last data filed at 12/26/16 0600  Gross per 24 hour  Intake            122.5 ml  Output                0 ml  Net            122.5 ml   Filed Weights   12/25/16 1522 12/25/16 1857 12/26/16 0422  Weight: 86.2 kg (190 lb) 86.2 kg (190 lb 0.6 oz) 88.5 kg (195 lb 1.7 oz)    Examination:  General exam: Appears calm and comfortable , Very anxious and restless Respiratory system: Clear to auscultation. Respiratory effort normal. Cardiovascular system: S1 & S2 heard, RRR. No JVD,  rubs, gallops or clicks. No pedal edema. Systolic grade V/VI heard best in the  right second intercostal space Gastrointestinal system: Abdomen is nondistended, soft and nontender. No organomegaly or masses felt. Normal bowel sounds heard. Central nervous system: Alert and oriented. No focal neurological deficits. Extremities: Symmetric 5 x 5 power. Skin: No rashes, lesions or ulcers Psychiatry: Judgement and insight appear normal. Mood & affect appropriate.     Data Reviewed:   CBC:  Recent Labs Lab 12/25/16 1604 12/26/16 0145  WBC 6.3 9.5  HGB 15.0 13.6  HCT 41.1 38.1  MCV 90.7 89.9  PLT 124*  329   Basic Metabolic Panel:  Recent Labs Lab 12/25/16 1604 12/25/16 2211 12/26/16 0145  NA 142 141 142  K 2.4* 3.6 3.6  CL 104 105 107  CO2 23 29 26   GLUCOSE 104* 108* 94  BUN 10 9 10   CREATININE 0.66 0.60 0.53  CALCIUM 8.8* 8.4* 8.3*  MG 1.7 1.7  --    GFR: Estimated Creatinine Clearance: 71 mL/min (by C-G formula based on SCr of 0.53 mg/dL). Liver Function Tests:  Recent Labs Lab 12/26/16 0145  AST 27  ALT 19  ALKPHOS 56  BILITOT 0.6  PROT 6.4*  ALBUMIN 3.5   No results for input(s): LIPASE, AMYLASE in the last 168 hours. No results for input(s): AMMONIA in the last 168 hours. Coagulation Profile:  Recent Labs Lab 12/26/16 0145  INR 1.03   Cardiac Enzymes:  Recent Labs Lab 12/25/16 1930 12/26/16 0145 12/26/16 0736  TROPONINI 0.08* 0.19* 0.13*   BNP (last 3 results) No results for input(s): PROBNP in the last 8760 hours. HbA1C: No results for input(s): HGBA1C in the last 72 hours. CBG:  Recent Labs Lab 12/25/16 1620 12/26/16 0831  GLUCAP 106* 86   Lipid Profile: No results for input(s): CHOL, HDL, LDLCALC, TRIG, CHOLHDL, LDLDIRECT in the last 72 hours. Thyroid Function Tests:  Recent Labs  12/25/16 2211  TSH 1.407   Anemia Panel: No results for input(s): VITAMINB12, FOLATE, FERRITIN, TIBC, IRON, RETICCTPCT in the last 72 hours. Sepsis Labs: No results for input(s): PROCALCITON, LATICACIDVEN in the last 168 hours.  No results found for this or any previous visit (from the past 240 hour(s)).       Radiology Studies: Dg Chest Portable 1 View  Result Date: 12/25/2016 CLINICAL DATA:  Syncope EXAM: PORTABLE CHEST 1 VIEW COMPARISON:  December 17, 2016 FINDINGS: There is no edema or consolidation. Heart is borderline enlarged with pulmonary vascularity within normal limits. No adenopathy. There are old healed rib fractures on the right. IMPRESSION: No edema or consolidation.  Borderline cardiac enlargement. Electronically Signed   By: Lowella Grip III M.D.   On: 12/25/2016 15:58        Scheduled Meds: . aspirin EC  81 mg Oral Daily  . folic acid  1 mg Oral Daily  . heparin  5,000 Units Subcutaneous Q8H  . irbesartan  150 mg Oral Daily  . LORazepam  1 mg Intravenous Once  . metoprolol tartrate  12.5 mg Oral BID  . multivitamin with minerals  1 tablet Oral Daily  . nicotine  21 mg Transdermal Daily  . thiamine  100 mg Oral Daily   Or  . thiamine  100 mg Intravenous Daily  . traZODone  100 mg Oral QHS   Continuous Infusions: . sodium chloride 75 mL/hr at 12/26/16 0422     LOS: 0 days    Time spent: 34 mins    Loman Logan Arsenio Loader, MD [facility] Pager [phone]   If 7PM-7AM, please contact night-coverage  [email or web address] Password [email or web address] 12/26/2016, 1:22 PM

## 2016-12-26 NOTE — Assessment & Plan Note (Signed)
I think that most of her Sx may well be psycho-somatizations, but need to be sure. She is on PRN lorazepam, but ran out - otherwise is on Buspar & trazodone.

## 2016-12-26 NOTE — Assessment & Plan Note (Signed)
Her complaints are very difficult to diagnose - she clearly has PACs on EKG here, but I cannot truthfully tell whether or not she is having any true arrhythmias - some of her Sx sound like she may be having bursts of SVT or PAF.    Plan 48 monitor - this should be sufficient since her palpitations "last all day"

## 2016-12-26 NOTE — Consult Note (Signed)
Cardiology Consultation:   Patient ID: Sarah Barnes; [id number]; 08-01-1950   Admit date: 12/25/2016 Date of Consult: 12/26/2016  Primary Care Provider: Mackie Pai, PA-C Primary Cardiologist: Dr Ellyn Hack   Patient Profile:   Sarah Barnes is a 66 y.o. female with a hx of daily ETOH, HTN, smoking, and anxiety and depression who is being seen today for the evaluation of syncope at the request of Dr Reesa Chew.  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 She drinks 1/2 fifth of vodka a day. She was admitted in March 2017 with ETOH and cocaine intoxication. She is on medication for HTN. Her EKG is abnormal with lateral Q waves, this was seen on her March 2017 EKG as well according to to [facility] records. The pt just saw Dr Ellyn Hack in the office 12/24/16 for evaluation of a murmur (AS), and palpitations. She was set up for an echo and event monitor.   Yesterday she was [place] outside [place] when she had an episode of syncope/LOC. During this time she was having her first alcoholic beverage of the day and did not even finish before she syncopized. She states prior to her syncope she told her sister that she felt very weak and wanted to lay down. She remembers waking up in an ambulance. In the ED she was noted to be hypokalemic-K+ 2.4,  Mg++ 1.7. The pt has had complaints of palpitations but no documented atrial fibrillation. She is also a fairly heavy smoker. In April 2018 she had laparoscopic hernia repair complicated by post op hypoxia. She was seen by Dr Elsworth Soho then. There is no history of CAD or prior cardiac evaluation.The pt denies any history of chest pain or tightness. She does have palpitations. She describes episodes of sustained tachycardia that can last "all day". Yesterday she did not have any tachycardia  prior to her syncopal episode, "it was actually a good day" from that standpoint. Since admission she has been in NSR without ectopy or arrhythmia.    Past Medical History:  Diagnosis Date  . Anemia   . Anxiety   . Arthritis    "all over" (08/13/2016)  . Chronic bronchitis ([place])   . COPD (chronic obstructive pulmonary disease) ([place])   . Depression   . Dyspnea    occ  . Dysrhythmia    palpitations occ due to anxiety  . ETOH abuse   . GERD (gastroesophageal reflux disease)   . Headache    "daily til I got glasses; now have headache once in awhile" (08/13/2016)  . Hepatitis 1960   "isolated &  hospitalized for 2 weeks; don't know which kind of hepatitis"   . Hernia, hiatal   . High cholesterol   . Hypertension   . Pneumonia    "once or twice" (08/13/2016)  . PONV (postoperative nausea and vomiting)   . Sciatica     Past Surgical History:  Procedure Laterality Date  . ANAL FISTULECTOMY  1970s X 3  . BACK SURGERY    . HERNIA REPAIR    . KNEE ARTHROSCOPY Right   . LAPAROSCOPIC CHOLECYSTECTOMY    . LAPAROSCOPIC INCISIONAL / UMBILICAL / VENTRAL HERNIA REPAIR  08/13/2016   VHR w/mesh  . LUMBAR DISC SURGERY     "herniated disc"  . NASAL  FRACTURE SURGERY  1970s X 2  . SHOULDER ARTHROSCOPY WITH ROTATOR CUFF REPAIR Right 1990s?  . TONSILLECTOMY  1951  . VENTRAL HERNIA REPAIR N/A 08/13/2016   Procedure: LAPAROSCOPIC VENTRAL HERNIA REPAIR WITH MESH;  Surgeon: Judeth Horn, MD;  Location: [place];  Service: General;  Laterality: N/A;     Inpatient Medications: Scheduled Meds: . folic acid  1 mg Oral Daily  . heparin  5,000 Units Subcutaneous Q8H  . multivitamin with minerals  1 tablet Oral Daily  . nicotine  21 mg Transdermal Daily  . thiamine  100 mg Oral Daily   Or  . thiamine  100 mg Intravenous Daily  . traZODone  100 mg Oral QHS   Continuous Infusions: . sodium chloride 75 mL/hr at 12/26/16 0422   PRN Meds: busPIRone, hydrALAZINE, ipratropium-albuterol, LORazepam **OR**  LORazepam  Allergies:   No Known Allergies  Social History:   Social History   Social History  . Marital status: Single    Spouse name: N/A  . Number of children: N/A  . Years of education: N/A   Occupational History  . Not on file.   Social History Main Topics  . Smoking status: Current Every Day Smoker    Packs/day: 0.50    Years: 49.00    Types: Cigarettes  . Smokeless tobacco: Never Used  . Alcohol use 50.4 oz/week    36 Cans of beer, 48 Shots of liquor per week     Comment: 08/13/2016 "couple beers-1 quart vodka daily; depends on my mood; 3, 12 packs of beer/week 1 1/2 quarts vodka/week""  . Drug use: Yes    Frequency: 1.0 time per week    Types: Marijuana  . Sexual activity: Not Currently   Other Topics Concern  . Not on file   Social History Narrative  . No narrative on file    Family History:   Family History  Problem Relation Age of Onset  . Diabetes Mother   . Hyperlipidemia Mother   . Hypertension Mother   . Heart attack Mother 75  . Hypertension Father   . Stroke Father      ROS:  Please see the history of present illness.  ROS  All other ROS reviewed and negative.     Physical Exam/Data:   Vitals:   12/26/16 0609 12/26/16 0814 12/26/16 0858 12/26/16 1040  BP: (!) 189/100 (!) 209/103 (!) 202/99 (!) 185/94  Pulse: 66     Resp:      Temp:      TempSrc:      SpO2:      Weight:      Height:        Intake/Output Summary (Last 24 hours) at 12/26/16 1053 Last data filed at 12/26/16 0600  Gross per 24 hour  Intake            122.5 ml  Output                0 ml  Net            122.5 ml   Filed Weights   12/25/16 1522 12/25/16 1857 12/26/16 0422  Weight: 190 lb (86.2 kg) 190 lb 0.6 oz (86.2 kg) 195 lb 1.7 oz (88.5 kg)   Body mass index is 34.56 kg/m.  General:  Overweight Caucasian female, anxious but in no acute distress HEENT: normal Lymph: no adenopathy Neck: no JVD Endocrine:  No thryomegaly Vascular: transmitted murmur to  carotids Cardiac:  RRR with 2/6  systolic murmur AOV that radiates to her CA. She has a preserved S2.  Lungs:  Scattered wheezing Abd: obese, soft, nontender, no hepatomegaly  Ext: no edema Musculoskeletal:  No deformities, BUE and BLE strength normal and equal Skin: warm and dry, multiple tattoos  Neuro:  CNs 2-12 intact, no focal abnormalities noted Psych:  Normal affect   EKG:  The EKG was personally reviewed and demonstrates:  NSR with lateral Qs, QTc 514 Telemetry:  Telemetry was personally reviewed and demonstrates:  NSR  Laboratory Data:  Chemistry  Recent Labs Lab 12/25/16 1604 12/25/16 2211 12/26/16 0145  NA 142 141 142  K 2.4* 3.6 3.6  CL 104 105 107  CO2 23 29 26   GLUCOSE 104* 108* 94  BUN 10 9 10   CREATININE 0.66 0.60 0.53  CALCIUM 8.8* 8.4* 8.3*  GFRNONAA >60 >60 >60  GFRAA >60 >60 >60  ANIONGAP 15 7 9      Recent Labs Lab 12/26/16 0145  PROT 6.4*  ALBUMIN 3.5  AST 27  ALT 19  ALKPHOS 56  BILITOT 0.6   Hematology  Recent Labs Lab 12/25/16 1604 12/26/16 0145  WBC 6.3 9.5  RBC 4.53 4.24  HGB 15.0 13.6  HCT 41.1 38.1  MCV 90.7 89.9  MCH 33.1 32.1  MCHC 36.5* 35.7  RDW 12.5 12.6  PLT 124* 183   Cardiac Enzymes  Recent Labs Lab 12/25/16 1930 12/26/16 0145 12/26/16 0736  TROPONINI 0.08* 0.19* 0.13*   No results for input(s): TROPIPOC in the last 168 hours.  BNP  Recent Labs Lab 12/25/16 1604  BNP 86.3    DDimer No results for input(s): DDIMER in the last 168 hours.  Radiology/Studies:  Dg Chest Portable 1 View  Result Date: 12/25/2016 CLINICAL DATA:  Syncope EXAM: PORTABLE CHEST 1 VIEW COMPARISON:  December 17, 2016 FINDINGS: There is no edema or consolidation. Heart is borderline enlarged with pulmonary vascularity within normal limits. No adenopathy. There are old healed rib fractures on the right. IMPRESSION: No edema or consolidation.  Borderline cardiac enlargement. Electronically Signed   By: Lowella Grip III M.D.   On:  12/25/2016 15:58    Assessment and Plan:   Syncope and collapse- I suspect she had a hypotensive episode- out in the sun, ETOH, and daily hypertensive medication. It does not sound like she had an arrhythmia yesterday, though it does sound like she may have been having episodic PAF for the past few months.  Murmur- Mild to moderate AS on exam  Troponin level elevated- She will need an ischemic evaluation before discharge- multiple cardiac risk factors  Palpitations- She describes episodic tachycardia-? PAF or PSVT  Smoking/ COPD by history- She saw pulmonary when she was hypoxic April 2018 after laparoscopic hernia repair.   Daily ETOH- This will need to be addressed again. She was admitted for anxiety, depression, intoxication March 2017 (+ cocaine as well then).   HTN- On Minipress and Lasix prior to admission. B/P is nbow uncontrolled- 638 systolic.   Anxiety and depression- Buspar and Trazodone prior to admission  Family History of CAD- Pt's mother died at 48 from an MI  Plan: Continue to monitor, check echo, K+ replacement ordered.  Will start an ARB for HTN and add low dose beta blocker and ASA 81 mg. Check fasting lipids. MD to see. I have made her NPO after midnight for possible cath/ stress in am.   Signed, Kerin Ransom, PA-C  12/26/2016 10:53 AM

## 2016-12-26 NOTE — Assessment & Plan Note (Signed)
Not ready to even discuss smoking cessation.

## 2016-12-26 NOTE — Assessment & Plan Note (Signed)
Was reportedly on a BP medication in the past - but not on one now - BP borderline low. May make rate control options difficult.

## 2016-12-26 NOTE — Progress Notes (Signed)
Pt. BP 189/100 with HR of 66. On call MD paged and made aware. Will continue to monitor and will carry out any new orders.

## 2016-12-27 ENCOUNTER — Other Ambulatory Visit: Payer: Self-pay

## 2016-12-27 ENCOUNTER — Encounter (HOSPITAL_COMMUNITY)
Admission: EM | Disposition: A | Discharge status: Home / Self Care | Payer: Self-pay | Source: Home / Self Care | Attending: Emergency Medicine

## 2016-12-27 DIAGNOSIS — I350 Nonrheumatic aortic (valve) stenosis: Secondary | ICD-10-CM

## 2016-12-27 DIAGNOSIS — I119 Hypertensive heart disease without heart failure: Secondary | ICD-10-CM | POA: Diagnosis not present

## 2016-12-27 DIAGNOSIS — R7989 Other specified abnormal findings of blood chemistry: Secondary | ICD-10-CM | POA: Diagnosis not present

## 2016-12-27 DIAGNOSIS — J449 Chronic obstructive pulmonary disease, unspecified: Secondary | ICD-10-CM | POA: Diagnosis not present

## 2016-12-27 DIAGNOSIS — R55 Syncope and collapse: Secondary | ICD-10-CM | POA: Diagnosis not present

## 2016-12-27 DIAGNOSIS — I421 Obstructive hypertrophic cardiomyopathy: Secondary | ICD-10-CM

## 2016-12-27 DIAGNOSIS — R9431 Abnormal electrocardiogram [ECG] [EKG]: Secondary | ICD-10-CM | POA: Diagnosis not present

## 2016-12-27 DIAGNOSIS — I358 Other nonrheumatic aortic valve disorders: Secondary | ICD-10-CM | POA: Diagnosis not present

## 2016-12-27 DIAGNOSIS — F411 Generalized anxiety disorder: Secondary | ICD-10-CM | POA: Diagnosis not present

## 2016-12-27 DIAGNOSIS — R748 Abnormal levels of other serum enzymes: Secondary | ICD-10-CM | POA: Diagnosis not present

## 2016-12-27 DIAGNOSIS — I10 Essential (primary) hypertension: Secondary | ICD-10-CM | POA: Diagnosis not present

## 2016-12-27 DIAGNOSIS — F1020 Alcohol dependence, uncomplicated: Secondary | ICD-10-CM | POA: Diagnosis not present

## 2016-12-27 HISTORY — PX: RIGHT/LEFT HEART CATH AND CORONARY ANGIOGRAPHY: CATH118266

## 2016-12-27 LAB — POCT I-STAT 3, ART BLOOD GAS (G3+)
Acid-Base Excess: 2 mmol/L (ref 0.0–2.0)
BICARBONATE: 26.3 mmol/L (ref 20.0–28.0)
O2 Saturation: 97 %
PCO2 ART: 41.4 mmHg (ref 32.0–48.0)
PH ART: 7.412 (ref 7.350–7.450)
PO2 ART: 87 mmHg (ref 83.0–108.0)
TCO2: 28 mmol/L (ref 0–100)

## 2016-12-27 LAB — POCT I-STAT 3, VENOUS BLOOD GAS (G3P V)
Acid-Base Excess: 2 mmol/L (ref 0.0–2.0)
Acid-Base Excess: 1 mmol/L (ref 0.0–2.0)
Bicarbonate: 27.2 mmol/L (ref 20.0–28.0)
Bicarbonate: 25.6 mmol/L (ref 20.0–28.0)
O2 Saturation: 71 %
O2 Saturation: 65 %
PCO2 VEN: 42.7 mmHg — AB (ref 44.0–60.0)
PCO2 VEN: 41.4 mmHg — AB (ref 44.0–60.0)
PH VEN: 7.411 (ref 7.250–7.430)
PH VEN: 7.399 (ref 7.250–7.430)
PO2 VEN: 34 mmHg (ref 32.0–45.0)
TCO2: 28 mmol/L (ref 0–100)
TCO2: 27 mmol/L (ref 0–100)
pO2, Ven: 37 mmHg (ref 32.0–45.0)

## 2016-12-27 LAB — BASIC METABOLIC PANEL
Anion gap: 8 (ref 5–15)
BUN: 8 mg/dL (ref 6–20)
CO2: 25 mmol/L (ref 22–32)
Calcium: 8.9 mg/dL (ref 8.9–10.3)
Chloride: 110 mmol/L (ref 101–111)
Creatinine, Ser: 0.48 mg/dL (ref 0.44–1.00)
GFR calc Af Amer: >60 mL/min (ref >60)
GFR calc non Af Amer: >60 mL/min (ref >60)
Glucose, Bld: 98 mg/dL (ref 65–99)
Potassium: 3.3 mmol/L — ABNORMAL LOW (ref 3.5–5.1)
Sodium: 143 mmol/L (ref 135–145)

## 2016-12-27 LAB — LIPID PANEL
Cholesterol: 259 mg/dL — ABNORMAL HIGH (ref 0–200)
HDL: 35 mg/dL — ABNORMAL LOW (ref >40)
LDL Cholesterol: UNDETERMINED mg/dL (ref 0–99)
Total CHOL/HDL Ratio: 7.4 RATIO
Triglycerides: 854 mg/dL — ABNORMAL HIGH (ref <150)
VLDL: UNDETERMINED mg/dL (ref 0–40)

## 2016-12-27 LAB — GLUCOSE, CAPILLARY: GLUCOSE-CAPILLARY: 103 mg/dL — AB (ref 65–99)

## 2016-12-27 SURGERY — RIGHT/LEFT HEART CATH AND CORONARY ANGIOGRAPHY
Anesthesia: LOCAL

## 2016-12-27 MED ORDER — LABETALOL HCL 5 MG/ML IV SOLN
INTRAVENOUS | Status: AC
  Filled 2016-12-27: qty 4

## 2016-12-27 MED ORDER — HEPARIN SODIUM (PORCINE) 1000 UNIT/ML IJ SOLN
INTRAMUSCULAR | Status: DC | PRN
  Administered 2016-12-27: 5000 [IU] via INTRAVENOUS

## 2016-12-27 MED ORDER — MIDAZOLAM HCL 2 MG/2ML IJ SOLN
INTRAMUSCULAR | Status: AC
  Filled 2016-12-27: qty 2

## 2016-12-27 MED ORDER — SODIUM CHLORIDE 0.9% FLUSH: 3.0000 mL | Freq: Two times a day (BID) | INTRAVENOUS | Status: DC

## 2016-12-27 MED ORDER — FENTANYL CITRATE (PF) 100 MCG/2ML IJ SOLN
INTRAMUSCULAR | Status: DC | PRN
  Administered 2016-12-27: 50 ug via INTRAVENOUS
  Administered 2016-12-27 (×2): 25 ug via INTRAVENOUS

## 2016-12-27 MED ORDER — ACETAMINOPHEN 325 MG PO TABS
650.0000 mg | ORAL_TABLET | Freq: Once | ORAL | Status: AC
  Administered 2016-12-27: 650 mg via ORAL

## 2016-12-27 MED ORDER — LABETALOL HCL 5 MG/ML IV SOLN
INTRAVENOUS | Status: DC | PRN
  Administered 2016-12-27: 10 mg via INTRAVENOUS

## 2016-12-27 MED ORDER — VERAPAMIL HCL 2.5 MG/ML IV SOLN
INTRAVENOUS | Status: DC | PRN
  Administered 2016-12-27: 10 mL via INTRA_ARTERIAL

## 2016-12-27 MED ORDER — SODIUM CHLORIDE 0.9 % IV SOLN
INTRAVENOUS | Status: AC
  Administered 2016-12-27: 20:00:00 1000 mL via INTRAVENOUS

## 2016-12-27 MED ORDER — VERAPAMIL HCL 2.5 MG/ML IV SOLN
INTRAVENOUS | Status: AC
  Filled 2016-12-27: qty 2

## 2016-12-27 MED ORDER — FENTANYL CITRATE (PF) 100 MCG/2ML IJ SOLN
INTRAMUSCULAR | Status: AC
  Filled 2016-12-27: qty 2

## 2016-12-27 MED ORDER — HEPARIN (PORCINE) IN NACL 2-0.9 UNIT/ML-% IJ SOLN
INTRAMUSCULAR | Status: AC
  Filled 2016-12-27: qty 500

## 2016-12-27 MED ORDER — HEPARIN SODIUM (PORCINE) 1000 UNIT/ML IJ SOLN
INTRAMUSCULAR | Status: AC
  Filled 2016-12-27: qty 1

## 2016-12-27 MED ORDER — HEPARIN (PORCINE) IN NACL 2-0.9 UNIT/ML-% IJ SOLN
INTRAMUSCULAR | Status: AC | PRN
  Administered 2016-12-27: 1000 mL

## 2016-12-27 MED ORDER — SODIUM CHLORIDE 0.9% FLUSH: 3.0000 mL | INTRAVENOUS | Status: DC | PRN

## 2016-12-27 MED ORDER — METOPROLOL TARTRATE 25 MG PO TABS: 25.0000 mg | ORAL_TABLET | Freq: Two times a day (BID) | ORAL | Status: DC

## 2016-12-27 MED ORDER — IOPAMIDOL (ISOVUE-370) INJECTION 76%
INTRAVENOUS | Status: DC | PRN
  Administered 2016-12-27: 125 mL via INTRA_ARTERIAL

## 2016-12-27 MED ORDER — IRBESARTAN 75 MG PO TABS
150.0000 mg | ORAL_TABLET | Freq: Every day | ORAL | Status: DC
  Administered 2016-12-27 – 2016-12-28 (×2): 150 mg via ORAL
  Filled 2016-12-27: qty 1
  Filled 2016-12-27: qty 2

## 2016-12-27 MED ORDER — CARVEDILOL 12.5 MG PO TABS
12.5000 mg | ORAL_TABLET | Freq: Two times a day (BID) | ORAL | Status: DC
  Administered 2016-12-27 – 2016-12-28 (×2): 12.5 mg via ORAL
  Filled 2016-12-27 (×2): qty 1

## 2016-12-27 MED ORDER — ACETAMINOPHEN 325 MG PO TABS
ORAL_TABLET | ORAL | Status: AC
  Filled 2016-12-27: qty 2

## 2016-12-27 MED ORDER — IOPAMIDOL (ISOVUE-370) INJECTION 76%
INTRAVENOUS | Status: AC
  Filled 2016-12-27: qty 100

## 2016-12-27 MED ORDER — POTASSIUM CHLORIDE 10 MEQ/100ML IV SOLN
10.0000 meq | INTRAVENOUS | Status: AC
  Administered 2016-12-27 (×2): 10 meq via INTRAVENOUS
  Filled 2016-12-27 (×3): qty 100

## 2016-12-27 MED ORDER — LIDOCAINE HCL (PF) 1 % IJ SOLN
INTRAMUSCULAR | Status: DC | PRN
  Administered 2016-12-27 (×2): 2 mL

## 2016-12-27 MED ORDER — LIDOCAINE HCL (PF) 1 % IJ SOLN
INTRAMUSCULAR | Status: AC
  Filled 2016-12-27: qty 30

## 2016-12-27 MED ORDER — SODIUM CHLORIDE 0.9 % IV SOLN: 250.0000 mL | INTRAVENOUS | Status: DC | PRN

## 2016-12-27 MED ORDER — HEPARIN SODIUM (PORCINE) 5000 UNIT/ML IJ SOLN: 5000.0000 [IU] | Freq: Three times a day (TID) | INTRAMUSCULAR | Status: DC

## 2016-12-27 MED ORDER — MIDAZOLAM HCL 2 MG/2ML IJ SOLN
INTRAMUSCULAR | Status: DC | PRN
  Administered 2016-12-27: 1 mg via INTRAVENOUS
  Administered 2016-12-27: 2 mg via INTRAVENOUS
  Administered 2016-12-27: 1 mg via INTRAVENOUS

## 2016-12-27 SURGICAL SUPPLY — 21 items
CATH BALLN WEDGE 5F 110CM (CATHETERS) ×2 IMPLANT
CATH INFINITI 5 FR MPA2 (CATHETERS) ×2 IMPLANT
CATH INFINITI 5FR ANG PIGTAIL (CATHETERS) ×2 IMPLANT
CATH LAUNCHER 5F RADR (CATHETERS) ×1 IMPLANT
CATH LAUNCHER 6FR 3DRC (CATHETERS) ×1 IMPLANT
CATH OPTITORQUE TIG 4.0 5F (CATHETERS) ×2 IMPLANT
CATHETER LAUNCHER 5F RADR (CATHETERS) ×2
CATHETER LAUNCHER 6FR 3DRC (CATHETERS) ×2
COVER PRB 48X5XTLSCP FOLD TPE (BAG) ×1 IMPLANT
COVER PROBE 5X48 (BAG) ×1
DEVICE RAD COMP TR BAND LRG (VASCULAR PRODUCTS) ×2 IMPLANT
GLIDESHEATH SLEND A-KIT 6F 22G (SHEATH) ×2 IMPLANT
GUIDEWIRE INQWIRE 1.5J.035X260 (WIRE) ×1 IMPLANT
INQWIRE 1.5J .035X260CM (WIRE) ×2
KIT HEART LEFT (KITS) ×2 IMPLANT
PACK CARDIAC CATHETERIZATION (CUSTOM PROCEDURE TRAY) ×2 IMPLANT
SHEATH GLIDE SLENDER 4/5FR (SHEATH) ×2 IMPLANT
SYR MEDRAD MARK V 150ML (SYRINGE) ×2 IMPLANT
TRANSDUCER W/STOPCOCK (MISCELLANEOUS) ×2 IMPLANT
TUBING CIL FLEX 10 FLL-RA (TUBING) ×2 IMPLANT
WIRE HI TORQ VERSACORE-J 145CM (WIRE) ×2 IMPLANT

## 2016-12-27 NOTE — Care Management Note (Signed)
Case Management Note  Patient Details  Name: LISSETE MAESTAS MRN: [id number] Date of Birth: 06-29-1950  Subjective/Objective:   Transfer to [facility] for cardiac cath.                  Action/Plan:d/c plan home.   Expected Discharge Date:                  Expected Discharge Plan:  Acute to Acute Transfer  In-House Referral:     Discharge planning Services  CM Consult  Post Acute Care Choice:    Choice offered to:     DME Arranged:    DME Agency:     HH Arranged:    HH Agency:     Status of Service:  Completed, signed off  If discussed at [employer] of Stay Meetings, dates discussed:    Additional Comments:  Dessa Phi, RN 12/27/2016, 1:20 PM

## 2016-12-27 NOTE — Progress Notes (Signed)
Pt. Very tearful this morning, expressed to this RN her concerns for the procedure. Pt. Requesting to be asleep or to have a mild sedative so that she does not have to be aware during the whole procedure, states that it will increase her anxiety. Will make day RN aware in order to address to the MD prior to cath.

## 2016-12-27 NOTE — Brief Op Note (Signed)
   BRIEF CARDIAC CATHETERIZATION NOTE  12/25/2016 - 12/27/2016  7:00 PM  PATIENT:  Sarah Barnes  66 y.o. female with a [place] phobia and anxiety disorder as well as tobacco abuse and alcohol abuse. She was recently diagnosed with hypertrophic cardiomyopathy by echocardiogram for follow-up of the aortic valve murmur. She presented with syncope and collapse and had elevated troponin with chest pain. She's been having persistent palpitations) last several months.  Positive chest pain, she is referred for invasive evaluation right left heart catheterization.  PRE-OPERATIVE DIAGNOSIS:  Cp - positive troponin  POST-OPERATIVE DIAGNOSIS:    Angiographically normal coronary arteries, right dominant  Right heart PressureS: PAP/Mean 51/29/38 mmHg, PCP 24 mm AP. RVP/ADP 50/8/10 mmHg, RAP mean 8 mmHg.  LVP/EDP 170/12/24 (decreases to 130/14/24 with PVCs).  AOP/MAP: 167/90/1 22 mmHg  SaO2 97%, PaO2 68%.   Cardiac output/index by Fick: 4.76, 2.48  PROCEDURE:  Procedure(s): RIGHT/LEFT HEART CATH AND CORONARY ANGIOGRAPHY (N/A)  RHC  Left brachial sheath - existing IV catheter exchanged for 5 French glide sheath  RHC: 5 French right heart catheter advanced under fluoroscopic guidance into the pulmonary artery/PCWP position.   Simultaneous PA and AO sat obtained.  Once appropriate PA sets obtained, the catheter was pulled back into the RV and RA for pressure monitoring, then removed completely out of the body LHC/CORS  Right radial 6 French sheath, Seldinger technique with ultrasound guidance  5 French TIG 4.0 catheter (LCA Angiography & LV Hemodynamics). then exchanged for EZ Rad R followed by 6 Fr 3DRC for RCA (non-selective angiography)  Angled Pigtail Catheter for LV Hemodynamics - unable to advance MPA2 due to extreme tortuosity and subclavian artery. I was trying to be able to obtain LV gradient with an endhole catheter.  SURGEON:  Surgeon(s) and Role:    * Leonie Man, MD -  Primary  ANESTHESIA:   local and IV sedation; lidocaine for access sites. 4 mg verapamil, 100 mg vaginal  EBL:  < 50 mL  MEDICATIONS USED:  Contrast 125 mL; Heparin 5000 mL, Labetalol 10mg  IV  COUNTS:  YES  TOURNIQUET:  TR Band 14 mL @ 1855 hr  DICTATION: .Note written in [place]: transfer to [facility] - adjust medications per rounding Cardiology team - BP control, consider BB or Verapamil 2/2 palpitations/ PVCs  PATIENT DISPOSITION:  PACU - hemodynamically stable.   Delay start of Pharmacological VTE agent (>24hrs) due to surgical blood loss or risk of bleeding: not applicable   Glenetta Hew, M.D., M.S. Interventional Cardiologist   Pager # [phone] Phone # [phone] [address] [place], [place] [address]

## 2016-12-27 NOTE — H&P (View-Only) (Signed)
Progress Note  Patient Name: Sarah Barnes Date of Encounter: 12/27/2016  Primary Cardiologist: Dr Oval Linsey  Subjective   Very anxious, tearful, no chest pain or dyspnea  Inpatient Medications    Scheduled Meds: . aspirin EC  81 mg Oral Daily  . folic acid  1 mg Oral Daily  . heparin  5,000 Units Subcutaneous Q8H  . irbesartan  150 mg Oral Daily  . LORazepam  1 mg Intravenous Once  . metoprolol tartrate  12.5 mg Oral BID  . multivitamin with minerals  1 tablet Oral Daily  . nicotine  21 mg Transdermal Daily  . thiamine  100 mg Oral Daily   Or  . thiamine  100 mg Intravenous Daily  . traZODone  100 mg Oral QHS   Continuous Infusions: . sodium chloride 75 mL/hr at 12/26/16 2029  . sodium chloride    . sodium chloride    . potassium chloride     PRN Meds: sodium chloride, busPIRone, hydrALAZINE, ipratropium-albuterol, LORazepam **OR** LORazepam   Vital Signs    Vitals:   12/27/16 0539 12/27/16 0545 12/27/16 0633 12/27/16 0647  BP: (!) 203/102 (!) 181/93 (!) 165/84 (!) 160/90  Pulse: 66 75 79   Resp:      Temp:      TempSrc:      SpO2:      Weight:      Height:        Intake/Output Summary (Last 24 hours) at 12/27/16 0754 Last data filed at 12/27/16 0600  Gross per 24 hour  Intake             1800 ml  Output                0 ml  Net             1800 ml   Filed Weights   12/25/16 1857 12/26/16 0422 12/27/16 0457  Weight: 190 lb 0.6 oz (86.2 kg) 195 lb 1.7 oz (88.5 kg) 196 lb 10.4 oz (89.2 kg)    Telemetry    NSR - Personally Reviewed  ECG    NSR, lateral Qs- Personally Reviewed  Physical Exam   GEN: anxious, tearful, No acute distress.   Neck: No JVD Cardiac: RRR, 2/6 systolic murmur AOV area Respiratory: Clear to auscultation bilaterally. GI: Soft, nontender, non-distended  MS: No edema; No deformity. Neuro:  Nonfocal  Psych: Normal affect   Labs    Chemistry Recent Labs Lab 12/25/16 2211 12/26/16 0145 12/27/16 0449  NA 141  142 143  K 3.6 3.6 3.3*  CL 105 107 110  CO2 29 26 25   GLUCOSE 108* 94 98  BUN 9 10 8   CREATININE 0.60 0.53 0.48  CALCIUM 8.4* 8.3* 8.9  PROT  --  6.4*  --   ALBUMIN  --  3.5  --   AST  --  27  --   ALT  --  19  --   ALKPHOS  --  56  --   BILITOT  --  0.6  --   GFRNONAA >60 >60 >60  GFRAA >60 >60 >60  ANIONGAP 7 9 8      Hematology Recent Labs Lab 12/25/16 1604 12/26/16 0145  WBC 6.3 9.5  RBC 4.53 4.24  HGB 15.0 13.6  HCT 41.1 38.1  MCV 90.7 89.9  MCH 33.1 32.1  MCHC 36.5* 35.7  RDW 12.5 12.6  PLT 124* 183    Cardiac Enzymes Recent Labs Lab 12/25/16 1930  12/26/16 0145 12/26/16 0736  TROPONINI 0.08* 0.19* 0.13*   No results for input(s): TROPIPOC in the last 168 hours.   BNP Recent Labs Lab 12/25/16 1604  BNP 86.3     DDimer No results for input(s): DDIMER in the last 168 hours.   Radiology    Dg Chest Portable 1 View  Result Date: 12/25/2016 CLINICAL DATA:  Syncope EXAM: PORTABLE CHEST 1 VIEW COMPARISON:  December 17, 2016 FINDINGS: There is no edema or consolidation. Heart is borderline enlarged with pulmonary vascularity within normal limits. No adenopathy. There are old healed rib fractures on the right. IMPRESSION: No edema or consolidation.  Borderline cardiac enlargement. Electronically Signed   By: Lowella Grip III M.D.   On: 12/25/2016 15:58    Cardiac Studies   Echo 12/26/16- Study Conclusions  - Left ventricle: The cavity size was normal. There was severe   concentric hypertrophy. Systolic function was normal. The   estimated ejection fraction was in the range of 60% to 65%. There   was dynamic obstruction at rest in the outflow tract, with a peak   gradient of 52 mm Hg consistent with HOCM. Wall motion was   normal; there were no regional wall motion abnormalities.   Features are consistent with a pseudonormal left ventricular   filling pattern, with concomitant abnormal relaxation and   increased filling pressure (grade 2 diastolic  dysfunction).   Doppler parameters are consistent with high ventricular filling   pressure. - Aortic valve: Moderately calcified annulus. Trileaflet; normal   thickness, mildly calcified leaflets. Mean gradient (S): 12 mm   Hg. Valve area (VTI): 1.54 cm^2. Valve area (Vmax): 1.4 cm^2.   Valve area (Vmean): 1.23 cm^2. - Mitral valve: There is systolic anterior motion of the chordal   structures of the mitral valve. Heavily calcified posterior   mitral valve annulus. The findings are consistent with mild   stenosis. There was moderate regurgitation. Mean gradient (D): 6   mm Hg. Valve area by pressure half-time: 3.67 cm^2. Valve area by   continuity equation (using LVOT flow): 1.39 cm^2. - Left atrium: The atrium was moderately dilated.   Anterior-posterior dimension: 45 mm. - Right atrium: The atrium was mildly dilated. - Pulmonary arteries: PA peak pressure: 35 mm Hg (S).  Impressions:  - Normal LVF marked turbulence int the LVOT with dynamic outflow   tract gradient at rest of 02VOZD with systolic anterior motion of   the choral structures of the mitral valve and moderate VLF all   c/w HOCM. The right ventricular systolic pressure was increased   consistent with mild pulmonary hypertension.  Patient Profile     66 y.o. female admitted 12/26/16 after a syncopal spell. She had been sitting outside in the sun by a pool having a cocktail (h/o daily vodka) when she became weak and passed out.   Assessment & Plan    Syncope and collapse- Suspect she became hypotensive secondary to ETOH, being in direct sun, hypertensive medications on board, and HOCM  Murmur- HOCM with 52 mmHg resting gradient  Troponin level elevated-  multiple cardiac risk factors-cath today  Palpitations- She describes episodic tachycardia-? PAF or PSVT  Smoking/ COPD by history- She saw pulmonary when she was hypoxic April 2018 after laparoscopic hernia repair.   Daily ETOH- This will need to be  addressed again. She was admitted for anxiety, depression, intoxication March 2017 (+ cocaine as well then).   HTN- HCVD with LVH. B/P poorly controlled. Avapro added, Hydralazine prn  Anxiety and depression- Buspar and Trazodone prior to admission  Family History of CAD- Pt's mother died at 68 from an MI  Plan: will change to Rt and Lt heart cath today, give Avapro now. No evidence of diastolic CHF-BNP 28.7, no edema on CXR. Avoid hypotension with HOCM.   Angelena Form, PA-C  12/27/2016, 7:54 AM

## 2016-12-27 NOTE — Progress Notes (Signed)
Per cath lab pt is scheduled for cardiac cath at 3pm.

## 2016-12-27 NOTE — Progress Notes (Signed)
Progress Note  Patient Name: Sarah Barnes Date of Encounter: 12/27/2016  Primary Cardiologist: Dr Oval Linsey  Subjective   Very anxious, tearful, no chest pain or dyspnea  Inpatient Medications    Scheduled Meds: . aspirin EC  81 mg Oral Daily  . folic acid  1 mg Oral Daily  . heparin  5,000 Units Subcutaneous Q8H  . irbesartan  150 mg Oral Daily  . LORazepam  1 mg Intravenous Once  . metoprolol tartrate  12.5 mg Oral BID  . multivitamin with minerals  1 tablet Oral Daily  . nicotine  21 mg Transdermal Daily  . thiamine  100 mg Oral Daily   Or  . thiamine  100 mg Intravenous Daily  . traZODone  100 mg Oral QHS   Continuous Infusions: . sodium chloride 75 mL/hr at 12/26/16 2029  . sodium chloride    . sodium chloride    . potassium chloride     PRN Meds: sodium chloride, busPIRone, hydrALAZINE, ipratropium-albuterol, LORazepam **OR** LORazepam   Vital Signs    Vitals:   12/27/16 0539 12/27/16 0545 12/27/16 0633 12/27/16 0647  BP: (!) 203/102 (!) 181/93 (!) 165/84 (!) 160/90  Pulse: 66 75 79   Resp:      Temp:      TempSrc:      SpO2:      Weight:      Height:        Intake/Output Summary (Last 24 hours) at 12/27/16 0754 Last data filed at 12/27/16 0600  Gross per 24 hour  Intake             1800 ml  Output                0 ml  Net             1800 ml   Filed Weights   12/25/16 1857 12/26/16 0422 12/27/16 0457  Weight: 190 lb 0.6 oz (86.2 kg) 195 lb 1.7 oz (88.5 kg) 196 lb 10.4 oz (89.2 kg)    Telemetry    NSR - Personally Reviewed  ECG    NSR, lateral Qs- Personally Reviewed  Physical Exam   GEN: anxious, tearful, No acute distress.   Neck: No JVD Cardiac: RRR, 2/6 systolic murmur AOV area Respiratory: Clear to auscultation bilaterally. GI: Soft, nontender, non-distended  MS: No edema; No deformity. Neuro:  Nonfocal  Psych: Normal affect   Labs    Chemistry Recent Labs Lab 12/25/16 2211 12/26/16 0145 12/27/16 0449  NA 141  142 143  K 3.6 3.6 3.3*  CL 105 107 110  CO2 29 26 25   GLUCOSE 108* 94 98  BUN 9 10 8   CREATININE 0.60 0.53 0.48  CALCIUM 8.4* 8.3* 8.9  PROT  --  6.4*  --   ALBUMIN  --  3.5  --   AST  --  27  --   ALT  --  19  --   ALKPHOS  --  56  --   BILITOT  --  0.6  --   GFRNONAA >60 >60 >60  GFRAA >60 >60 >60  ANIONGAP 7 9 8      Hematology Recent Labs Lab 12/25/16 1604 12/26/16 0145  WBC 6.3 9.5  RBC 4.53 4.24  HGB 15.0 13.6  HCT 41.1 38.1  MCV 90.7 89.9  MCH 33.1 32.1  MCHC 36.5* 35.7  RDW 12.5 12.6  PLT 124* 183    Cardiac Enzymes Recent Labs Lab 12/25/16 1930  12/26/16 0145 12/26/16 0736  TROPONINI 0.08* 0.19* 0.13*   No results for input(s): TROPIPOC in the last 168 hours.   BNP Recent Labs Lab 12/25/16 1604  BNP 86.3     DDimer No results for input(s): DDIMER in the last 168 hours.   Radiology    Dg Chest Portable 1 View  Result Date: 12/25/2016 CLINICAL DATA:  Syncope EXAM: PORTABLE CHEST 1 VIEW COMPARISON:  December 17, 2016 FINDINGS: There is no edema or consolidation. Heart is borderline enlarged with pulmonary vascularity within normal limits. No adenopathy. There are old healed rib fractures on the right. IMPRESSION: No edema or consolidation.  Borderline cardiac enlargement. Electronically Signed   By: Lowella Grip III M.D.   On: 12/25/2016 15:58    Cardiac Studies   Echo 12/26/16- Study Conclusions  - Left ventricle: The cavity size was normal. There was severe   concentric hypertrophy. Systolic function was normal. The   estimated ejection fraction was in the range of 60% to 65%. There   was dynamic obstruction at rest in the outflow tract, with a peak   gradient of 52 mm Hg consistent with HOCM. Wall motion was   normal; there were no regional wall motion abnormalities.   Features are consistent with a pseudonormal left ventricular   filling pattern, with concomitant abnormal relaxation and   increased filling pressure (grade 2 diastolic  dysfunction).   Doppler parameters are consistent with high ventricular filling   pressure. - Aortic valve: Moderately calcified annulus. Trileaflet; normal   thickness, mildly calcified leaflets. Mean gradient (S): 12 mm   Hg. Valve area (VTI): 1.54 cm^2. Valve area (Vmax): 1.4 cm^2.   Valve area (Vmean): 1.23 cm^2. - Mitral valve: There is systolic anterior motion of the chordal   structures of the mitral valve. Heavily calcified posterior   mitral valve annulus. The findings are consistent with mild   stenosis. There was moderate regurgitation. Mean gradient (D): 6   mm Hg. Valve area by pressure half-time: 3.67 cm^2. Valve area by   continuity equation (using LVOT flow): 1.39 cm^2. - Left atrium: The atrium was moderately dilated.   Anterior-posterior dimension: 45 mm. - Right atrium: The atrium was mildly dilated. - Pulmonary arteries: PA peak pressure: 35 mm Hg (S).  Impressions:  - Normal LVF marked turbulence int the LVOT with dynamic outflow   tract gradient at rest of 33IRJJ with systolic anterior motion of   the choral structures of the mitral valve and moderate VLF all   c/w HOCM. The right ventricular systolic pressure was increased   consistent with mild pulmonary hypertension.  Patient Profile     66 y.o. female admitted 12/26/16 after a syncopal spell. She had been sitting outside in the sun by a pool having a cocktail (h/o daily vodka) when she became weak and passed out.   Assessment & Plan    Syncope and collapse- Suspect she became hypotensive secondary to ETOH, being in direct sun, hypertensive medications on board, and HOCM  Murmur- HOCM with 52 mmHg resting gradient  Troponin level elevated-  multiple cardiac risk factors-cath today  Palpitations- She describes episodic tachycardia-? PAF or PSVT  Smoking/ COPD by history- She saw pulmonary when she was hypoxic April 2018 after laparoscopic hernia repair.   Daily ETOH- This will need to be  addressed again. She was admitted for anxiety, depression, intoxication March 2017 (+ cocaine as well then).   HTN- HCVD with LVH. B/P poorly controlled. Avapro added, Hydralazine prn  Anxiety and depression- Buspar and Trazodone prior to admission  Family History of CAD- Pt's mother died at 5 from an MI  Plan: will change to Rt and Lt heart cath today, give Avapro now. No evidence of diastolic CHF-BNP 16.1, no edema on CXR. Avoid hypotension with HOCM.   Angelena Form, PA-C  12/27/2016, 7:54 AM

## 2016-12-27 NOTE — Progress Notes (Signed)
PROGRESS NOTE    Sarah Barnes  LOV:[id number] DOB: May 20, 1951 DOA: 12/25/2016 PCP: Elise Benne   Brief Narrative:  66 year old female with history of alcohol abuse, tobacco abuse, COPD, anxiety and hypertension was admitted to the hospital for evaluation of syncope. She was recently diagnosed with new murmur which is likely moderate to severe aortic stenosis versus mitral regurgitation. Admitted for further workup. Cardiology consulted. Sees also on alcohol withdrawal protocol.   Assessment & Plan:   Active Problems:   Essential hypertension   Generalized anxiety disorder   Tobacco use disorder   Substance abuse   Aortic systolic murmur on examination   Syncope and collapse   Abnormal EKG   Troponin level elevated   Syncope and collapse Palpitations -Cardiac enzymes peaked at 0.19. Likely cardiac in origin -TSH- 1.4 -Give IV fluids, - No significant arrhythmias on telemetry, normal sinus rhythm. - 2-D echo with preserved EF at 88%, grade 2 diastolic dysfunction, but there was an evidence of HOCM, further management per cardiology .  Elevated troponin -Likely demand in nature. Peaked at 0.19 -Plans for left heart catheterization today, she'll be transferred to [facility] for cardiac cath -Beta blocker has been started by cardiology.  Hypertension - Blood pressure remains poorly controlled even after starting of Avapro, I will increase her metoprolol from 12.5-25 mg twice a day, and tinea with when necessary hydralazine  Alcohol abuse Tobacco use -CIWA -Counseled to quit using alcohol and tobacco -Nicotine patch transdermal   History of anxiety and insomnia -On home buspirone as needed and trazodone at bedtime -CIWA in place   DVT prophylaxis: Subcutaneous heparin Code Status: Full code Family Communication:  Sister at bedside Disposition Plan: To be determined. She is going to [facility] for cardiac cath today  Consultants:    Cardiology  Procedures:   None  Antimicrobials:   None   Subjective: Patient denies any complaints, has some anxiety, any chest pain, shortness of breath, nausea or vomiting .  Objective: Vitals:   12/27/16 0633 12/27/16 0647 12/27/16 0958 12/27/16 1342  BP: (!) 165/84 (!) 160/90 (!) 178/92 (!) 175/90  Pulse: 79   80  Resp:    (!) 21  Temp:    98.4 F (36.9 C)  TempSrc:    Oral  SpO2:    96%  Weight:      Height:        Intake/Output Summary (Last 24 hours) at 12/27/16 1546 Last data filed at 12/27/16 0853  Gross per 24 hour  Intake             1800 ml  Output                0 ml  Net             1800 ml   Filed Weights   12/25/16 1857 12/26/16 0422 12/27/16 0457  Weight: 86.2 kg (190 lb 0.6 oz) 88.5 kg (195 lb 1.7 oz) 89.2 kg (196 lb 10.4 oz)    Examination:  General exam:Awake alert oriented 3, intact is benign insight, no apparent distress, anxious Respiratory system: Clear to auscultation, no wheezing, no use of accessory muscles  Cardiovascular system: S1 & S2 heard, RRR. No rubs murmurs gallops  Gastrointestinal system: Abdomen soft, nontender, nondistended, bowel sounds present  Extremities: Symmetric 5 x 5 power. No clubbing, no edema     Data Reviewed:   CBC:  Recent Labs Lab 12/25/16 1604 12/26/16 0145  WBC 6.3 9.5  HGB  15.0 13.6  HCT 41.1 38.1  MCV 90.7 89.9  PLT 124* 967   Basic Metabolic Panel:  Recent Labs Lab 12/25/16 1604 12/25/16 2211 12/26/16 0145 12/27/16 0449  NA 142 141 142 143  K 2.4* 3.6 3.6 3.3*  CL 104 105 107 110  CO2 23 29 26 25   GLUCOSE 104* 108* 94 98  BUN 10 9 10 8   CREATININE 0.66 0.60 0.53 0.48  CALCIUM 8.8* 8.4* 8.3* 8.9  MG 1.7 1.7  --   --    GFR: Estimated Creatinine Clearance: 71.3 mL/min (by C-G formula based on SCr of 0.48 mg/dL). Liver Function Tests:  Recent Labs Lab 12/26/16 0145  AST 27  ALT 19  ALKPHOS 56  BILITOT 0.6  PROT 6.4*  ALBUMIN 3.5   No results for input(s):  LIPASE, AMYLASE in the last 168 hours. No results for input(s): AMMONIA in the last 168 hours. Coagulation Profile:  Recent Labs Lab 12/26/16 0145  INR 1.03   Cardiac Enzymes:  Recent Labs Lab 12/25/16 1930 12/26/16 0145 12/26/16 0736  TROPONINI 0.08* 0.19* 0.13*   BNP (last 3 results) No results for input(s): PROBNP in the last 8760 hours. HbA1C: No results for input(s): HGBA1C in the last 72 hours. CBG:  Recent Labs Lab 12/25/16 1620 12/26/16 0831 12/27/16 0746  GLUCAP 106* 86 103*   Lipid Profile:  Recent Labs  12/27/16 0449  CHOL 259*  HDL 35*  LDLCALC UNABLE TO CALCULATE IF TRIGLYCERIDE OVER 400 mg/dL  TRIG 854*  CHOLHDL 7.4   Thyroid Function Tests:  Recent Labs  12/25/16 2211  TSH 1.407   Anemia Panel: No results for input(s): VITAMINB12, FOLATE, FERRITIN, TIBC, IRON, RETICCTPCT in the last 72 hours. Sepsis Labs: No results for input(s): PROCALCITON, LATICACIDVEN in the last 168 hours.  No results found for this or any previous visit (from the past 240 hour(s)).       Radiology Studies: Dg Chest Portable 1 View  Result Date: 12/25/2016 CLINICAL DATA:  Syncope EXAM: PORTABLE CHEST 1 VIEW COMPARISON:  December 17, 2016 FINDINGS: There is no edema or consolidation. Heart is borderline enlarged with pulmonary vascularity within normal limits. No adenopathy. There are old healed rib fractures on the right. IMPRESSION: No edema or consolidation.  Borderline cardiac enlargement. Electronically Signed   By: Lowella Grip III M.D.   On: 12/25/2016 15:58        Scheduled Meds: . aspirin EC  81 mg Oral Daily  . folic acid  1 mg Oral Daily  . heparin  5,000 Units Subcutaneous Q8H  . irbesartan  150 mg Oral Daily  . LORazepam  1 mg Intravenous Once  . metoprolol tartrate  12.5 mg Oral BID  . multivitamin with minerals  1 tablet Oral Daily  . nicotine  21 mg Transdermal Daily  . thiamine  100 mg Oral Daily   Or  . thiamine  100 mg Intravenous  Daily  . traZODone  100 mg Oral QHS   Continuous Infusions: . sodium chloride 75 mL/hr at 12/26/16 2029  . sodium chloride    . sodium chloride       LOS: 0 days     Phillips Climes, MD [facility] Pager [phone]  If 7PM-7AM, please contact night-coverage [email or web address] Password [facility] 12/27/2016, 3:46 PM

## 2016-12-27 NOTE — Interval H&P Note (Signed)
History and Physical Interval Note:  12/27/2016 5:31 PM  Sarah Barnes  has presented today for surgery, with the diagnosis of cp with mild troponin elevation  The various methods of treatment have been discussed with the patient and family. After consideration of risks, benefits and other options for treatment, the patient has consented to  Procedure(s): RIGHT/LEFT HEART CATH AND CORONARY ANGIOGRAPHY (N/A) with possible Percutaneous Coronary Intervention as a surgical intervention .  The patient's history has been reviewed, patient examined, no change in status, stable for surgery.  I have reviewed the patient's chart and labs.  Questions were answered to the patient's satisfaction.    Cath Lab Visit (complete for each Cath Lab visit)  Clinical Evaluation Leading to the Procedure:   ACS: Yes.    Non-ACS:    Anginal Classification: CCS III  Anti-ischemic medical therapy: Minimal Therapy (1 class of medications)  Non-Invasive Test Results: No non-invasive testing performed  Prior CABG: No previous CABG   Glenetta Hew

## 2016-12-28 ENCOUNTER — Encounter (HOSPITAL_COMMUNITY): Payer: Self-pay | Admitting: Cardiology

## 2016-12-28 ENCOUNTER — Other Ambulatory Visit: Payer: Self-pay | Admitting: Student

## 2016-12-28 DIAGNOSIS — R55 Syncope and collapse: Secondary | ICD-10-CM | POA: Diagnosis not present

## 2016-12-28 DIAGNOSIS — F411 Generalized anxiety disorder: Secondary | ICD-10-CM | POA: Diagnosis not present

## 2016-12-28 DIAGNOSIS — I119 Hypertensive heart disease without heart failure: Secondary | ICD-10-CM

## 2016-12-28 DIAGNOSIS — F1099 Alcohol use, unspecified with unspecified alcohol-induced disorder: Secondary | ICD-10-CM | POA: Diagnosis not present

## 2016-12-28 DIAGNOSIS — I421 Obstructive hypertrophic cardiomyopathy: Secondary | ICD-10-CM

## 2016-12-28 DIAGNOSIS — I10 Essential (primary) hypertension: Secondary | ICD-10-CM | POA: Diagnosis not present

## 2016-12-28 DIAGNOSIS — I350 Nonrheumatic aortic (valve) stenosis: Secondary | ICD-10-CM

## 2016-12-28 DIAGNOSIS — I358 Other nonrheumatic aortic valve disorders: Secondary | ICD-10-CM | POA: Diagnosis not present

## 2016-12-28 DIAGNOSIS — F17200 Nicotine dependence, unspecified, uncomplicated: Secondary | ICD-10-CM | POA: Diagnosis not present

## 2016-12-28 DIAGNOSIS — IMO0002 Reserved for concepts with insufficient information to code with codable children: Secondary | ICD-10-CM

## 2016-12-28 MED ORDER — IRBESARTAN 150 MG PO TABS: 150.0000 mg | ORAL_TABLET | Freq: Every day | ORAL | 1 refills | Status: DC

## 2016-12-28 MED ORDER — ADULT MULTIVITAMIN W/MINERALS CH: 1.0000 | ORAL_TABLET | Freq: Every day | ORAL | Status: AC

## 2016-12-28 MED ORDER — ASPIRIN 81 MG PO TBEC: 81.0000 mg | DELAYED_RELEASE_TABLET | Freq: Every day | ORAL | 1 refills | Status: DC

## 2016-12-28 MED ORDER — NICOTINE 21 MG/24HR TD PT24: 21.0000 mg | MEDICATED_PATCH | Freq: Every day | TRANSDERMAL | 0 refills | Status: DC

## 2016-12-28 MED ORDER — CARVEDILOL 25 MG PO TABS: 25.0000 mg | ORAL_TABLET | Freq: Two times a day (BID) | ORAL | 1 refills | Status: DC

## 2016-12-28 MED ORDER — ACETAMINOPHEN 325 MG PO TABS
650.0000 mg | ORAL_TABLET | Freq: Four times a day (QID) | ORAL | Status: DC | PRN
  Administered 2016-12-28: 06:00:00 650 mg via ORAL
  Filled 2016-12-28: qty 2

## 2016-12-28 MED FILL — ASPIRIN ADULT LOW STRENGTH: 81 | 30 days supply | Qty: 30 | Fill #0

## 2016-12-28 MED FILL — IRBESARTAN 150 MG TABLET: 150 | 30 days supply | Qty: 30 | Fill #0

## 2016-12-28 MED FILL — CARVEDILOL 25 MG TABLET: 25 | 30 days supply | Qty: 60 | Fill #0

## 2016-12-28 NOTE — Care Management Obs Status (Signed)
[facility] NOTIFICATION   Patient Details  Name: CAMALA TALWAR MRN: [id number] Date of Birth: [birth year]   Medicare Observation Status Notification Given:  Yes    Zenon Mayo, RN 12/28/2016, 9:46 AM

## 2016-12-28 NOTE — Care Management Note (Addendum)
Case Management Note  Patient Details  Name: Sarah Barnes MRN: [id number] Date of Birth: [birth year]  Subjective/Objective:   From home alone, pta indep, sister at bedside, s/p heart cath. Cath is clean for dc today.                  Action/Plan: NCM will follow for dc needs.  Expected Discharge Date:                  Expected Discharge Plan:  Home/Self Care  In-House Referral:     Discharge planning Services  CM Consult  Post Acute Care Choice:    Choice offered to:     DME Arranged:    DME Agency:     HH Arranged:    [place] Agency:     Status of Service:  Completed, signed off  If discussed at [employer] of Stay Meetings, dates discussed:    Additional Comments:  Zenon Mayo, RN 12/28/2016, 9:05 AM

## 2016-12-28 NOTE — Discharge Summary (Signed)
Physician Discharge Summary  Sarah Barnes ZJQ:[id number] DOB: 22-Feb-1951 DOA: 12/25/2016  PCP: Mackie Pai, PA-C  Admit date: 12/25/2016 Discharge date: 12/28/2016  Time spent: 30 minutes  Recommendations for Outpatient Follow-up:  1. Repeat BMET to follow electrolytes and renal function  2. Reassess BP and adjust antihypertensive regimen as needed 3. Continue assisting patient as needed with tobacco and alcohol cessation.   Discharge Diagnoses:  Active Problems:   Essential hypertension   Generalized anxiety disorder   Tobacco use disorder   Substance abuse   Aortic systolic murmur on examination   Syncope   Abnormal EKG   Troponin level elevated   Hypertrophic obstructive cardiomyopathy (HCC)   Mild aortic stenosis   Alcohol use disorder ([place])   Discharge Condition: stable and improved. Discharge home with instructions to follow up with PCP and cardiology service at discharge.  Diet recommendation: heart healthy/low sodium   Filed Weights   12/26/16 0422 12/27/16 0457 12/28/16 0238  Weight: 88.5 kg (195 lb 1.7 oz) 89.2 kg (196 lb 10.4 oz) 91.4 kg (201 lb 8 oz)    History of present illness:  As per Dr. Reesa Chew H&P written on 12/25/16 66 y.o. female with medical history significant of alcohol use, COPD, anxiety, depression, tobacco use, hypertension came to the ER after having an episode of syncope. Patient states she has been having symptoms of intermittent palpitations for past several months and along with feeling of generalized weakness. Due to these complaints she saw Dr. Ellyn Hack yesterday and she was scheduled to get an echocardiogram and Holter monitor in next 2 weeks. During the visit she was also diagnosed with new onset loud systolic murmur suspicious for aortic stenosis. This afternoon when she was sitting [place] she had an episode of syncope/LOC. During this time she was having her first alcoholic beverage today and did not even finish before she  syncopized. She states prior to her syncope she told her sister that she feels very weak and wants to go to bed. She remembers is waking up in an ambulance without any prolonged postictal moment. She denied any bowel or bladder incontinence, any other presyncopal warning signs, tongue biting or any other seizure type of symptoms. She denies having similar symptoms in the past. Denies any recent medication changes. She admits of drinking half a bottle of vodka daily for over 20 years. She smokes 1.5 pack of cigarettes daily and occasionally smokes marijuana.  Hospital Course:  1-syncope and collapse -- 2-D echo with preserved EF at 24%, grade 2 diastolic dysfunction, but there was an evidence of HOCM -cardiology on board and after performing a left heart cath (due to elevated troponin); no major abnormalities found. -patient will have Holter and cardiac MRI as an outpatient -no CP or SOB currently -TSH WNL -no significant arrhythmias seen on telemetry -patient advise to minimize alcohol consumption and to follow instructions for better control of her BP.  2-elevated troponin -as mentioned above; normal cath during this admission  -advise to quit smoking and decrease alcohol consumption -BP agent adjusted for better control -started on baby aspirin daily   3-uncontrolled HTN -antihypertensive agents has been adjusted  -patient instructed to follow low sodium/heart healthy diet  -outpatient follow up with PCP and cardiologist to further adjust her antihypertensive drugs as needed. -meds at discharge (lasix 40 mg daily; Avapro 150mg  daily ands Coreg 25mg  BID)  4-alcohol abuse/tobbaco abuse -no withdrawal appreciated -cessation counseling provided  -discharge on nicotine patch -advise to continue folic  acid and thiamine supplementation (using MV)  5-anxiety/depression and insomnia -will continue Buspar and trazodone -continue minipress   6-hypokalemia -repleted -patient discharge now  on avapro (which would help with potassium retention) -advise to quit alcohol use   Procedures: Echo 12/26/16: - Left ventricle: The cavity size was normal. There was severe concentric hypertrophy. Systolic function was normal. The estimated ejection fraction was in the range of 60% to 65%. There was dynamic obstruction at rest in the outflow tract, with a peak gradient of 52 mm Hg consistent with HOCM. Wall motion was normal; there were no regional wall motion abnormalities. Features are consistent with a pseudonormal left ventricular filling pattern, with concomitant abnormal relaxation and increased filling pressure (grade 2 diastolic dysfunction). Doppler parameters are consistent with high ventricular filling pressure. - Aortic valve: Moderately calcified annulus. Trileaflet; normal thickness, mildly calcified leaflets. Mean gradient (S): 12 mm Hg. Valve area (VTI): 1.54 cm^2. Valve area (Vmax): 1.4 cm^2. Valve area (Vmean): 1.23 cm^2. - Mitral valve: There is systolic anterior motion of the chordal structures of the mitral valve. Heavily calcified posterior mitral valve annulus. The findings are consistent with mild stenosis. There was moderate regurgitation. Mean gradient (D): 6 mm Hg. Valve area by pressure half-time: 3.67 cm^2. Valve area by continuity equation (using LVOT flow): 1.39 cm^2. - Left atrium: The atrium was moderately dilated. Anterior-posterior dimension: 45 mm. - Right atrium: The atrium was mildly dilated. - Pulmonary arteries: PA peak pressure: 35 mm Hg (S).  Impressions: - Normal LVF marked turbulence int the LVOT with dynamic outflow tract gradient at rest of 44YJEH with systolic anterior motion of the choral structures of the mitral valve and moderate VLF all c/w HOCM. The right ventricular systolic pressure was increased consistent with mild pulmonary hypertension.  LHC 12/27/16:  No obstructive coronary  disease.  Consultations:  Cardiology   Discharge Exam: Vitals:   12/28/16 0614 12/28/16 0740  BP: 135/85 (!) 154/90  Pulse: 78 87  Resp: (!) 21 18  Temp:  98.7 F (37.1 C)  SpO2: 95% 97%    General: afebrile, no CP and no SOB currently. Denies HA. Cardiovascular: S1 and S2, no rubs, no gallops, positive SEM Respiratory: good air movement, no wheezing, no crackles  Abd: soft, NT, ND, positive BS Extremities: no edema, no cyanosis, no clubbing  Neuro: intact and no focal deficits appreciated.   Discharge Instructions   Discharge Instructions    Diet - low sodium heart healthy    Complete by:  As directed    Discharge instructions    Complete by:  As directed    Follow heart healthy diet  Take medications as prescribed Please stop alcohol consumption completely  Follow heart healthy and low sodium diet  Follow up with PCP in 10 days Follow up with cardiology as instructed     Current Discharge Medication List    START taking these medications   Details  aspirin EC 81 MG EC tablet Take 1 tablet (81 mg total) by mouth daily. Qty: 30 tablet, Refills: 1    carvedilol (COREG) 25 MG tablet Take 1 tablet (25 mg total) by mouth 2 (two) times daily with a meal. Qty: 60 tablet, Refills: 1    irbesartan (AVAPRO) 150 MG tablet Take 1 tablet (150 mg total) by mouth daily. Qty: 30 tablet, Refills: 1    Multiple Vitamin (MULTIVITAMIN WITH MINERALS) TABS tablet Take 1 tablet by mouth daily.    nicotine (NICODERM CQ - DOSED IN MG/24 HOURS) 21 mg/24hr  patch Place 1 patch (21 mg total) onto the skin daily. Qty: 28 patch, Refills: 0      CONTINUE these medications which have NOT CHANGED   Details  busPIRone (BUSPAR) 15 MG tablet Take 1 tablet (15 mg total) by mouth 2 (two) times daily. Qty: 60 tablet, Refills: 0    furosemide (LASIX) 40 MG tablet TAKE 1 TABLET EVERY DAY Qty: 90 tablet, Refills: 0    prazosin (MINIPRESS) 2 MG capsule TAKE 1 CAPSULE EVERY MORNING Qty: 90  capsule, Refills: 0    traZODone (DESYREL) 100 MG tablet TAKE 1 TABLET AT BEDTIME Qty: 90 tablet, Refills: 3       No Known Allergies Follow-up Information    Leonie Man, MD Follow up on 01/23/2017.   Specialty:  Cardiology Why:  The office will contact you to arrange a time for your Cardiac MRI. Follow-up with Dr. Ellyn Hack on 01/23/2017 at 8:20AM.   Contact information: [address] [place] [address] [phone]        [place], Percell Miller, [place]. Schedule an appointment as soon as possible for a visit in 10 day(s).   Specialties:  Internal Medicine, Family Medicine Contact information: [address] [place] [address] [phone]           The results of significant diagnostics from this hospitalization (including imaging, microbiology, ancillary and laboratory) are listed below for reference.    Significant Diagnostic Studies: Dg Chest Portable 1 View  Result Date: 12/25/2016 CLINICAL DATA:  Syncope EXAM: PORTABLE CHEST 1 VIEW COMPARISON:  December 17, 2016 FINDINGS: There is no edema or consolidation. Heart is borderline enlarged with pulmonary vascularity within normal limits. No adenopathy. There are old healed rib fractures on the right. IMPRESSION: No edema or consolidation.  Borderline cardiac enlargement. Electronically Signed   By: Lowella Grip III M.D.   On: 12/25/2016 15:58    Microbiology: No results found for this or any previous visit (from the past 240 hour(s)).   Labs: Basic Metabolic Panel:  Recent Labs Lab 12/25/16 1604 12/25/16 2211 12/26/16 0145 12/27/16 0449  NA 142 141 142 143  K 2.4* 3.6 3.6 3.3*  CL 104 105 107 110  CO2 23 29 26 25   GLUCOSE 104* 108* 94 98  BUN 10 9 10 8   CREATININE 0.66 0.60 0.53 0.48  CALCIUM 8.8* 8.4* 8.3* 8.9  MG 1.7 1.7  --   --    Liver Function Tests:  Recent Labs Lab 12/26/16 0145  AST 27  ALT 19  ALKPHOS 56  BILITOT 0.6  PROT 6.4*  ALBUMIN 3.5    CBC:  Recent Labs Lab 12/25/16 1604 12/26/16 0145  WBC 6.3 9.5  HGB 15.0 13.6  HCT 41.1 38.1  MCV 90.7 89.9  PLT 124* 183   Cardiac Enzymes:  Recent Labs Lab 12/25/16 1930 12/26/16 0145 12/26/16 0736  TROPONINI 0.08* 0.19* 0.13*   BNP: BNP (last 3 results)  Recent Labs  12/25/16 1604  BNP 86.3    CBG:  Recent Labs Lab 12/25/16 1620 12/26/16 0831 12/27/16 0746  GLUCAP 106* 86 103*    Signed:  Barton Dubois MD.  [facility] 12/28/2016, 11:50 AM

## 2016-12-28 NOTE — Progress Notes (Addendum)
Progress Note  Patient Name: Eulis Foster Date of Encounter: 12/28/2016  Primary Cardiologist: Dr. Ellyn Hack  Subjective   Feeling well.  Anxious.  Wants to go home.   Inpatient Medications    Scheduled Meds: . aspirin EC  81 mg Oral Daily  . carvedilol  12.5 mg Oral BID WC  . folic acid  1 mg Oral Daily  . heparin  5,000 Units Subcutaneous Q8H  . irbesartan  150 mg Oral Daily  . LORazepam  1 mg Intravenous Once  . multivitamin with minerals  1 tablet Oral Daily  . nicotine  21 mg Transdermal Daily  . sodium chloride flush  3 mL Intravenous Q12H  . thiamine  100 mg Oral Daily   Or  . thiamine  100 mg Intravenous Daily  . traZODone  100 mg Oral QHS   Continuous Infusions: . sodium chloride 75 mL/hr at 12/27/16 2000  . sodium chloride     PRN Meds: sodium chloride, acetaminophen, busPIRone, hydrALAZINE, ipratropium-albuterol, LORazepam **OR** LORazepam, sodium chloride flush   Vital Signs    Vitals:   12/28/16 0400 12/28/16 0600 12/28/16 0614 12/28/16 0740  BP: 129/76  135/85 (!) 154/90  Pulse: 84  78 87  Resp: (!) 24 17 (!) 21 18  Temp:    98.7 F (37.1 C)  TempSrc:    Oral  SpO2: 96%  95% 97%  Weight:      Height:        Intake/Output Summary (Last 24 hours) at 12/28/16 1045 Last data filed at 12/28/16 0741  Gross per 24 hour  Intake             2180 ml  Output                0 ml  Net             2180 ml   Filed Weights   12/26/16 0422 12/27/16 0457 12/28/16 0238  Weight: 88.5 kg (195 lb 1.7 oz) 89.2 kg (196 lb 10.4 oz) 91.4 kg (201 lb 8 oz)    Telemetry    Sinus rhythm.  No events - Personally Reviewed  ECG    n/a - Personally Reviewed  Physical Exam   GEN: No acute distress.   Neck: No JVD Cardiac: RRR, III/VI systolic murmur at LUSB, rubs, or gallops.  Respiratory: Clear to auscultation bilaterally. GI: Soft, nontender, non-distended  MS: No edema; No deformity.  R wrist site C/D/I Neuro:  Nonfocal  Psych: Normal affect   Labs     Chemistry Recent Labs Lab 12/25/16 2211 12/26/16 0145 12/27/16 0449  NA 141 142 143  K 3.6 3.6 3.3*  CL 105 107 110  CO2 29 26 25   GLUCOSE 108* 94 98  BUN 9 10 8   CREATININE 0.60 0.53 0.48  CALCIUM 8.4* 8.3* 8.9  PROT  --  6.4*  --   ALBUMIN  --  3.5  --   AST  --  27  --   ALT  --  19  --   ALKPHOS  --  56  --   BILITOT  --  0.6  --   GFRNONAA >60 >60 >60  GFRAA >60 >60 >60  ANIONGAP 7 9 8      Hematology Recent Labs Lab 12/25/16 1604 12/26/16 0145  WBC 6.3 9.5  RBC 4.53 4.24  HGB 15.0 13.6  HCT 41.1 38.1  MCV 90.7 89.9  MCH 33.1 32.1  MCHC 36.5* 35.7  RDW  12.5 12.6  PLT 124* 183    Cardiac Enzymes Recent Labs Lab 12/25/16 1930 12/26/16 0145 12/26/16 0736  TROPONINI 0.08* 0.19* 0.13*   No results for input(s): TROPIPOC in the last 168 hours.   BNP Recent Labs Lab 12/25/16 1604  BNP 86.3     DDimer No results for input(s): DDIMER in the last 168 hours.   Radiology    No results found.  Cardiac Studies   Echo 12/26/16: Study Conclusions  - Left ventricle: The cavity size was normal. There was severe   concentric hypertrophy. Systolic function was normal. The   estimated ejection fraction was in the range of 60% to 65%. There   was dynamic obstruction at rest in the outflow tract, with a peak   gradient of 52 mm Hg consistent with HOCM. Wall motion was   normal; there were no regional wall motion abnormalities.   Features are consistent with a pseudonormal left ventricular   filling pattern, with concomitant abnormal relaxation and   increased filling pressure (grade 2 diastolic dysfunction).   Doppler parameters are consistent with high ventricular filling   pressure. - Aortic valve: Moderately calcified annulus. Trileaflet; normal   thickness, mildly calcified leaflets. Mean gradient (S): 12 mm   Hg. Valve area (VTI): 1.54 cm^2. Valve area (Vmax): 1.4 cm^2.   Valve area (Vmean): 1.23 cm^2. - Mitral valve: There is systolic anterior  motion of the chordal   structures of the mitral valve. Heavily calcified posterior   mitral valve annulus. The findings are consistent with mild   stenosis. There was moderate regurgitation. Mean gradient (D): 6   mm Hg. Valve area by pressure half-time: 3.67 cm^2. Valve area by   continuity equation (using LVOT flow): 1.39 cm^2. - Left atrium: The atrium was moderately dilated.   Anterior-posterior dimension: 45 mm. - Right atrium: The atrium was mildly dilated. - Pulmonary arteries: PA peak pressure: 35 mm Hg (S).  Impressions:  - Normal LVF marked turbulence int the LVOT with dynamic outflow   tract gradient at rest of 73SKAJ with systolic anterior motion of   the choral structures of the mitral valve and moderate VLF all   c/w HOCM. The right ventricular systolic pressure was increased   consistent with mild pulmonary hypertension.  LHC 12/27/16:  No obstructive coronary disease.  Patient Profile     Ms. Paden is a 63F with anxiety, alcohol abuse, hypertension and depression here with a syncopal event.  Assessment & Plan    # HOCM:  LVH is likely 2/2 poorly controlled hypertension.  Carvedilol and irbesartan were started this admission. Her syncope was more related to intravascular volume depletion than arrhythmia.  No arrhythmia noted on telemetry.  Will arrange for cardiac MRI as an outpatient.   # Hypertension: BP control as above.  # Demand ischemia:  Normal coronaries on cath.    Signed, Skeet Latch, MD  12/28/2016, 10:45 AM

## 2016-12-31 ENCOUNTER — Telehealth: Payer: Self-pay | Admitting: Student

## 2016-12-31 ENCOUNTER — Telehealth: Payer: Self-pay | Admitting: Behavioral Health

## 2016-12-31 NOTE — Telephone Encounter (Signed)
Called patient to ask her what time/day she would like her cardiac MRI.  She stated she wants an early morning appointment.  Message sent to technician and precert.

## 2016-12-31 NOTE — Addendum Note (Signed)
Addended by: Milderd Meager on: 12/31/2016 10:19 AM   Modules accepted: Orders

## 2016-12-31 NOTE — Telephone Encounter (Signed)
Attempted to reach patient for TCM/Hospital Follow-up call. Left message for patient to return call when available.    

## 2017-01-01 ENCOUNTER — Other Ambulatory Visit: Payer: Self-pay | Admitting: Medical

## 2017-01-01 NOTE — Telephone Encounter (Signed)
Transition Care Management Follow-up Telephone Call  PCP: Elise Benne  Admit date: 12/25/2016 Discharge date: 12/28/2016   Recommendations for Outpatient Follow-up:  1. Repeat BMET to follow electrolytes and renal function  2. Reassess BP and adjust antihypertensive regimen as needed 3. Continue assisting patient as needed with tobacco and alcohol cessation.   Discharge Diagnoses:  Active Problems:   Essential hypertension   Generalized anxiety disorder   Tobacco use disorder   Substance abuse   Aortic systolic murmur on examination   Syncope   Abnormal EKG   Troponin level elevated   Hypertrophic obstructive cardiomyopathy (HCC)   Mild aortic stenosis   Alcohol use disorder ([place])   Discharge Condition: stable and improved. Discharge home with instructions to follow up with PCP and cardiology service at discharge.   How have you been since you were released from the hospital? Patient stated, "I'm doing alright, but have periods where I am lightheaded".   Do you understand why you were in the hospital? yes   Do you understand the discharge instructions? yes   Where were you discharged to? Home   Items Reviewed:  Medications reviewed: yes  Allergies reviewed: yes, NKA  Dietary changes reviewed: yes, low sodium diet  Referrals reviewed: yes, follow up with PCP and cardiology   Functional Questionnaire:   Activities of Daily Living (ADLs):   She states they are independent in the following: ambulation, bathing and hygiene, feeding, continence, grooming, toileting and dressing States they require assistance with the following: None   Any transportation issues/concerns?: no   Any patient concerns? yes, patient would like a different medication for anxiety, she currently takes Buspar, but doesn't feel that it's very effective.   Confirmed importance and date/time of follow-up visits scheduled yes, 01/03/17 at 9:30 AM.  Provider Appointment  booked with Mackie Pai, PA-C.  Confirmed with patient if condition begins to worsen call PCP or go to the ER.  Patient was given the office number and encouraged to call back with question or concerns.  : yes

## 2017-01-02 ENCOUNTER — Encounter: Payer: Self-pay | Admitting: Student

## 2017-01-02 ENCOUNTER — Telehealth: Payer: Self-pay | Admitting: Student

## 2017-01-02 NOTE — Telephone Encounter (Signed)
Called the patient and gave her the date, time and location of her cardiac MRI.  Message sent to B. Strader. Letter mailed to the patient.

## 2017-01-03 ENCOUNTER — Telehealth: Payer: Self-pay | Admitting: Medical

## 2017-01-03 ENCOUNTER — Encounter: Payer: Self-pay | Admitting: Medical

## 2017-01-03 ENCOUNTER — Ambulatory Visit (INDEPENDENT_AMBULATORY_CARE_PROVIDER_SITE_OTHER): Payer: Medicare HMO | Admitting: Medical

## 2017-01-03 VITALS — BP 169/78 | HR 64 | Temp 97.4°F | Resp 16 | Ht 63.0 in | Wt 193.6 lb

## 2017-01-03 DIAGNOSIS — F411 Generalized anxiety disorder: Secondary | ICD-10-CM | POA: Diagnosis not present

## 2017-01-03 NOTE — Patient Instructions (Signed)
Pt left today with no treatment.  Pt and sister state leaving our practice. So I will send dismissal letter but provide 30 days or emergency care.  Could not write benzo based on pt history and decision we made in august. Pt refused psychiatrist referral.  Refused follow for recent cardiac work up today by leaving office.   Will notify pcp or today visit.

## 2017-01-03 NOTE — Progress Notes (Addendum)
   Subjective:    Patient ID: Sarah Barnes, female    DOB: 08-Apr-1951, 66 y.o.   MRN: [id number]  HPI  Pt in for a follow up.  Pt and sister are quite upset since pcp and I decided in the past not give her benzodiazepine or xanax.   Pt refused my recommendation for psychiatrist referral in the past. Pt had  ED visits where she presented with polysubstance overdose and she was given suicide precautions.  One prior drug screen did not show xanax which should have been present.   On 02-23-2016 I had discussed pt history with Dr Etter Sjogren. Decision was to not  continue xanax going forward but place  psychiatrist referral. I offered/recommended. Pt refused referral.   Today when I entered room pt and sister expressed extreme frustration that I am not treating her anxiety adequatley. Basically threatening if I don't write they will leave. In fact they basically stormed out about 2-3 minutes into interview.  In addition pt sister briefly stated that her sister has a murmur found by cardiologist and she wonders why sister was never told. Note I did put in referral to cardiologist in April 2018. Pt finally saw cardiologist in August 2018. Unclear why delay. Numerous attempts to contact pt per epic were made regarding the referral. I do distinctly remembering when I put referral in that pt stated she would not go stating she does not have funds to see specialist.  Pt has no cardiac symptoms today. No chest pain or palpitations.  Note also in past pt walked out of visit when I wanted to repeat ekg in past.       Review of Systems     Objective:   Physical Exam   Exam not done left before had a chance.     Assessment & Plan:  Pt left today with no treatment.  Pt and sister state leaving our practice. So I will send dismissal letter but provide 30 days or emergency care.  Could not write benzo based on pt history and decision we made in august. Pt refused psychiatrist referral.  Refused  follow for recent cardiac work up today by leaving office.   Will notify pcp or today visit.

## 2017-01-05 HISTORY — PX: TRANSTHORACIC ECHOCARDIOGRAM: SHX275

## 2017-01-07 ENCOUNTER — Other Ambulatory Visit (HOSPITAL_COMMUNITY): Payer: Self-pay

## 2017-01-09 ENCOUNTER — Other Ambulatory Visit: Payer: Self-pay | Admitting: Medical

## 2017-01-11 NOTE — Telephone Encounter (Signed)
Patient dismissed from [facility] by Mackie Pai PA-C , effective January 03, 2017. Dismissal letter sent out by certified / registered mail.  daj

## 2017-01-14 ENCOUNTER — Ambulatory Visit (INDEPENDENT_AMBULATORY_CARE_PROVIDER_SITE_OTHER): Payer: Medicare HMO

## 2017-01-14 DIAGNOSIS — R002 Palpitations: Secondary | ICD-10-CM | POA: Diagnosis not present

## 2017-01-14 DIAGNOSIS — R0602 Shortness of breath: Secondary | ICD-10-CM | POA: Diagnosis not present

## 2017-01-16 ENCOUNTER — Ambulatory Visit (HOSPITAL_COMMUNITY)
Admission: RE | Admit: 2017-01-16 | Discharge: 2017-01-16 | Disposition: A | Discharge status: Home / Self Care | Payer: Medicare HMO | Source: Ambulatory Visit | Attending: Student | Admitting: Student

## 2017-01-16 DIAGNOSIS — I421 Obstructive hypertrophic cardiomyopathy: Secondary | ICD-10-CM | POA: Diagnosis not present

## 2017-01-16 DIAGNOSIS — I340 Nonrheumatic mitral (valve) insufficiency: Secondary | ICD-10-CM | POA: Diagnosis not present

## 2017-01-16 MED ORDER — GADOBENATE DIMEGLUMINE 529 MG/ML IV SOLN
30.0000 mL | Freq: Once | INTRAVENOUS | Status: AC
  Administered 2017-01-16: 30 mL via INTRAVENOUS

## 2017-01-23 ENCOUNTER — Ambulatory Visit (INDEPENDENT_AMBULATORY_CARE_PROVIDER_SITE_OTHER): Payer: Medicare HMO | Admitting: Cardiology

## 2017-01-23 VITALS — BP 150/84 | HR 64 | Ht 63.0 in | Wt 186.4 lb

## 2017-01-23 DIAGNOSIS — F17200 Nicotine dependence, unspecified, uncomplicated: Secondary | ICD-10-CM

## 2017-01-23 DIAGNOSIS — I350 Nonrheumatic aortic (valve) stenosis: Secondary | ICD-10-CM | POA: Diagnosis not present

## 2017-01-23 DIAGNOSIS — I10 Essential (primary) hypertension: Secondary | ICD-10-CM

## 2017-01-23 DIAGNOSIS — R002 Palpitations: Secondary | ICD-10-CM | POA: Diagnosis not present

## 2017-01-23 DIAGNOSIS — T671XXD Heat syncope, subsequent encounter: Secondary | ICD-10-CM

## 2017-01-23 DIAGNOSIS — I421 Obstructive hypertrophic cardiomyopathy: Secondary | ICD-10-CM

## 2017-01-23 MED ORDER — CARVEDILOL 25 MG PO TABS: 25.0000 mg | ORAL_TABLET | Freq: Two times a day (BID) | ORAL | 3 refills | Status: DC

## 2017-01-23 MED FILL — CARVEDILOL 25 MG TABLET: 25 | 45 days supply | Qty: 90 | Fill #0

## 2017-01-23 NOTE — Progress Notes (Signed)
PCP: Patient, No Pcp Per  Clinic Note: Chief Complaint  Patient presents with  . Hospitalization Follow-up    result of monitor, no chest pain , no sob, no swelling  . Cardiomyopathy    Hypertrophic obstructive    HPI: Sarah Barnes is a 66 y.o. female with a PMH below who presents today for evaluation of palpitations  is a  at the request of [place], Percell Miller, [place]. Fabianna has generalized anxiety disorder and clearcut Agoraphobia. 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    Sarah Barnes  was actually seen in initial consultation back on August 6 for significant palpitations, dyspnea and dizziness. Also noted chest discomfort. She had extreme amount of anxiety associated with her symptoms.  Recent Hospitalizations: Admitted 12/26/2016 with syncope related to dehydration. She underwent cardiac catheterization revealing normal coronary arteries. Plan was cardiac MRI as an outpatient.  Studies Personally Reviewed - (if available, images/films reviewed: From Epic Chart or Care Everywhere)  48 hour monitor: 6 short runs of PACs less than 4 beats. Otherwise normal  2-D echo: EF 60-65% with dynamic outflow tract obstruction at rest. Peak gradient 52 mmHg consistent with HOCM.  GRII DD area. Aortic sclerosis but no stenosis. Systolic anterior motion of mitral valve chordae. Mild mitral stenosis. Moderate LA dilation and mild RA dilation. Peak PA pressures 35 mmHg.  R&LHC 12/27/2016:   Hemodynamic findings consistent with mild to moderate pulmonary hypertension -mostly secondary to elevated LVEDP.  Significant catheter-related ectopy in the left ventricle indicates hyperdynamic contraction of the base. This also made measuring intracavitary gradient very difficult.  The left ventricular ejection fraction is 55-65% by visual estimate.  LV end diastolic pressure is mildly  elevated.  Very tortuous coronary arteries on the left system suggesting hypertensive heart disease Diagnostic Diagram     Cardiac MRI: Hypertrophic cardiomyopathy with septal thickness of 19 mm. EF 72%, moderate MR. . Moderate LAD. Partial fusion of right and left cusps of the aortic valve- no stenosis. LVOT turbulence , transient SAM   Interval History: Sarah Barnes presents in follow-up from her hospitalization where she actually was admitted shortly after I saw her in clinic for chest pain and palpitations as well as acute onset dyspnea. The actual presenting symptom was related to dehydration related syncope. Apparently she had not been eating and drinking well, was in the hot sun, and had already had an alcoholic beverage in the morning. She was very relieved that the results of her echocardiogram. I don't know how much of the cardiac MRI findings she understands.  Today, she comes in much calmer and feels overall better. She has not noted much improved palpitations, stating that she really only has light palpitations. Not to the same extent either length or intensity that she had had prior to her hospital stay. She also has not had any of the chest discomfort or significant dyspnea. She still has some dizziness, and notes that she doesn't a hard time staying adequately hydrated. She is concerned about drinking because of her edema.  No further near syncope or syncopal episodes. She has not yet started to do activity to know if she has much exertional dyspnea or chest discomfort. She denies any PND or orthopnea, and her edema has been relatively well-controlled.  No TIA/amaurosis fugax symptoms. No melena, hematochezia, hematuria, or epstaxis. No claudication.  ROS: A comprehensive was performed. Review  of Systems  Constitutional: Positive for malaise/fatigue (Feeling better).       Recent URI symptoms were sore throat and coughing.  HENT: Negative for congestion, nosebleeds and sore throat.    Respiratory: Positive for cough (Related to smoking now), shortness of breath and wheezing (Intermittent). Negative for sputum production.        URI symptoms notably improving  Cardiovascular:       Per history of present illness  Gastrointestinal: Positive for heartburn. Negative for blood in stool, constipation and melena.  Genitourinary: Positive for frequency (She does note frequent nocturia as well.). Negative for dysuria.  Musculoskeletal: Positive for back pain and joint pain. Negative for falls.  Neurological: Positive for dizziness. Negative for focal weakness, seizures, loss of consciousness and weakness.  Endo/Heme/Allergies: Positive for environmental allergies.  Psychiatric/Behavioral: Positive for depression. The patient is nervous/anxious and has insomnia.        Her level of anxiety is notably improved  All other systems reviewed and are negative.  I have reviewed and (if needed) personally updated the patient's problem list, medications, allergies, past medical and surgical history, social and family history.   Past Medical History:  Diagnosis Date  . Agoraphobia with panic disorder   . Anemia   . Arthritis    "all over" (08/13/2016)  . Chronic bronchitis ([place])   . COPD (chronic obstructive pulmonary disease) ([place])   . Depression   . Dyspnea    occ  . Dysrhythmia    palpitations occ due to anxiety  . ETOH abuse   . GAD (generalized anxiety disorder)   . GERD (gastroesophageal reflux disease)   . Headache    "daily til I got glasses; now have headache once in awhile" (08/13/2016)  . Hepatitis 1960   "isolated &  hospitalized for 2 weeks; don't know which kind of hepatitis"   . Hernia, hiatal   . High cholesterol   . Hypertension   . Pneumonia    "once or twice" (08/13/2016)  . PONV (postoperative nausea and vomiting)   . Sciatica   She has anxiety and depression. Pt struggles with her mood for years. She recently got emotional support Dog. She states dog is  "registered". She has had dog for a month and mood is improved.   Past Surgical History:  Procedure Laterality Date  . ANAL FISTULECTOMY  1970s X 3  . BACK SURGERY    . HERNIA REPAIR    . KNEE ARTHROSCOPY Right   . LAPAROSCOPIC CHOLECYSTECTOMY    . LAPAROSCOPIC INCISIONAL / UMBILICAL / VENTRAL HERNIA REPAIR  08/13/2016   VHR w/mesh  . LUMBAR DISC SURGERY     "herniated disc"  . NASAL FRACTURE SURGERY  1970s X 2  . RIGHT/LEFT HEART CATH AND CORONARY ANGIOGRAPHY N/A 12/27/2016   Procedure: RIGHT/LEFT HEART CATH AND CORONARY ANGIOGRAPHY;  Surgeon: Leonie Man, MD;  Location: [facility];  Service: Cardiovascular;  Laterality: N/A;  . SHOULDER ARTHROSCOPY WITH ROTATOR CUFF REPAIR Right 1990s?  . TONSILLECTOMY  1951  . VENTRAL HERNIA REPAIR N/A 08/13/2016   Procedure: LAPAROSCOPIC VENTRAL HERNIA REPAIR WITH MESH;  Surgeon: Judeth Horn, MD;  Location: [facility];  Service: General;  Laterality: N/A;    Current Meds  Medication Sig  . aspirin EC 81 MG EC tablet Take 1 tablet (81 mg total) by mouth daily.  . busPIRone (BUSPAR) 15 MG tablet Take 1 tablet (15 mg total) by mouth 2 (two) times daily.  . carvedilol (COREG) 25 MG  tablet Take 1 tablet (25 mg total) by mouth 2 (two) times daily with a meal.  . furosemide (LASIX) 40 MG tablet TAKE 1 TABLET EVERY DAY  . irbesartan (AVAPRO) 150 MG tablet Take 1 tablet (150 mg total) by mouth daily.  . Multiple Vitamin (MULTIVITAMIN WITH MINERALS) TABS tablet Take 1 tablet by mouth daily.  . nicotine (NICODERM CQ - DOSED IN MG/24 HOURS) 21 mg/24hr patch Place 1 patch (21 mg total) onto the skin daily.  . pantoprazole (PROTONIX) 40 MG tablet TAKE 1 TABLET EVERY DAY  . prazosin (MINIPRESS) 2 MG capsule TAKE 1 CAPSULE EVERY MORNING  . traZODone (DESYREL) 100 MG tablet TAKE 1 TABLET AT BEDTIME (Patient taking differently: TAKE 1 TABLET by mouth every morning for anxiety)  . [DISCONTINUED] carvedilol (COREG) 25 MG tablet Take 1 tablet (25 mg total) by  mouth 2 (two) times daily with a meal.    No Known Allergies  Social History   Social History  . Marital status: Single    Spouse name: N/A  . Number of children: N/A  . Years of education: N/A   Social History Main Topics  . Smoking status: Current Every Day Smoker    Packs/day: 1.50    Years: 49.00    Types: Cigarettes  . Smokeless tobacco: Never Used  . Alcohol use 50.4 oz/week    36 Cans of beer, 48 Shots of liquor per week     Comment: 08/13/2016 "couple beers-1 quart vodka daily; depends on my mood; 3, 12 packs of beer/week 1 1/2 quarts vodka/week""  . Drug use: Yes    Frequency: 1.0 time per week    Types: Marijuana, Cocaine     Comment: Cocaine + March 2017  . Sexual activity: Not Currently   Other Topics Concern  . None   Social History Narrative   Sarah Barnes has generalized anxiety disorder and clearcut Agoraphobia.   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    She currently lives with her sister.   Currently denies substance abuse beyond Tobacco (1-1/2 PPD) & EtOH (beer, vodka) - not ready to discuss quitting.    family history includes Angina in her mother; Cancer in her brother; Diabetes in her mother; Heart attack (age of onset: 30) in her mother; Hyperlipidemia in her mother; Hypertension in her father and mother; Stroke in her father.  Wt Readings from Last 3 Encounters:  01/23/17 186 lb 6.4 oz (84.6 kg)  01/03/17 193 lb 9.6 oz (87.8 kg)  12/28/16 201 lb 8 oz (91.4 kg)    PHYSICAL EXAM BP (!) 150/84   Pulse 64   Ht 5\' 3"  (1.6 m)   Wt 186 lb 6.4 oz (84.6 kg)   BMI 33.02 kg/m  Physical Exam  Constitutional: She is oriented to person, place, and time. She appears well-developed and well-nourished. No distress.  Well-groomed, But continues to smell of pet dander and cigarette smoke  HENT:  Head: Normocephalic and atraumatic.  Mouth/Throat: No oropharyngeal  exudate.  Eyes: Pupils are equal, round, and reactive to light. EOM are normal. No scleral icterus.  Neck: Normal range of motion. Neck supple. No hepatojugular reflux and no JVD present. Carotid bruit is not present.  Cardiovascular: Normal rate, regular rhythm, S1 normal, S2 normal and intact distal pulses.   Occasional extrasystoles (Borderline tachycardic with extrasystole) are present. PMI is not displaced.  Exam  reveals no gallop, no friction rub and no opening snap.   Murmur heard.  Harsh crescendo-decrescendo midsystolic murmur is present with a grade of 3/6  at the upper right sternal border radiating to the neck The intensity decreases with valsalva. Pulmonary/Chest: Accessory muscle usage (At baseline, bucket-handle breathing) present. No respiratory distress. She has decreased breath sounds (Diffusely diminished breath sounds with intermittent expiratory wheeze and interstitial sounds. No rales or rhonchi).  Abdominal: Soft. Bowel sounds are normal. She exhibits no distension. There is no tenderness. There is no rebound.  Musculoskeletal: Normal range of motion. She exhibits no edema or deformity.  Neurological: She is alert and oriented to person, place, and time. No cranial nerve deficit. Coordination normal.  Skin: Skin is warm and dry. No rash noted. No erythema.  Psychiatric: She has a normal mood and affect. Her behavior is normal. Judgment and thought content normal.  Much clearer in her thought process and answering questions. She seems to be much more relaxed and calm today. She is not accompanied by her sister and seems very sure of herself.  Nursing note and vitals reviewed.    Adult ECG Report No EKG  Other studies Reviewed: Additional studies/ records that were reviewed today include:  Recent Labs:   Lab Results  Component Value Date   CREATININE 0.48 12/27/2016   BUN 8 12/27/2016   NA 143 12/27/2016   K 3.3 (L) 12/27/2016   CL 110 12/27/2016   CO2 25 12/27/2016     Lab Results  Component Value Date   CKTOTAL 85 07/06/2015   TROPONINI 0.13 ([place]) 12/26/2016    ASSESSMENT / PLAN: Problem List Items Addressed This Visit    Essential hypertension (Chronic)    Up-and-down blood pressures. Currently a little bit elevated. I am somewhat leery of titrating her pressure medications too much because of orthostatic hypotension episodes, but we may want to increase irbesartan at follow-up if her pressures remain elevated. Provided she takes her medication at least after her morning meal if not even after lunch.      Relevant Medications   carvedilol (COREG) 25 MG tablet   Heart palpitations - PACs (Chronic)    Mostly PACs on monitor, but not a lot -- could have been b/c her Carvedilol dose was increased.  Continue high dose Carvedilol.      Hypertrophic obstructive cardiomyopathy (HCC) (Chronic)    New diagnosis which is clearly explain her palpitations. She did have lots of ectopy when I examined her during her last visit. Interestingly, her monitor only showed occasional PACs with short little runs of PAT no more than 4 beats. Rare PVCs noted.  Plan for now will be to continue carvedilol at current dose. Would move the morning dose of Avapro to ensure that his after meal to avoid the morning orthostatic hypotension episodes. We will back off on her furosemide dosage to one half tablet daily with the other one half tablet as needed.  She is on prazosin, will need to determine if this is necessary because it makes her susceptible to morning hypotension.  Also, ask when her the importance of adequate hydration, and avoiding standing up quickly.      Relevant Medications   carvedilol (COREG) 25 MG tablet   Mild aortic stenosis (Chronic)    More concern for HOCM than AS - will continue to monitor.       Relevant Medications   carvedilol (COREG) 25 MG tablet   Syncope - Primary    Dips  of syncope that she had sounds like it was consistent with  dehydration and related to alcohol. Stressed to her the importance of staying hydrated & taking her BP meds later in the AM.      Relevant Medications   carvedilol (COREG) 25 MG tablet   Tobacco use disorder    Still not quite ready to discuss smoking cessation at this point, but is trying to cut back.        Still not quite ready to discuss smoking cessation at this point, but is trying to cut back.  Current medicines are reviewed at length with the patient today. (+/- concerns) n/a The following changes have been made: n/a  Patient Instructions  MEDICATION CHANGES  TAKE 1/2 TABLET OF FUROSEMIDE IN THE MORNING , IF YOU BECOME SHORT OF BREATH OR SWELLING OCCUR MAY TAKE THE OTHER 1/2 TABLET LATER THAT DAY IF NEEDED   DO NOT TAKE MEDICATION UNTIL YOU EAT BREAKFAST IN THE MORNINGS.    CHECK BLOOD PRESSURE TAKE BLOOD PRESSURE  FOR THE M=NEXT WEEK AND BRING WITH YOU TO APPOINTMENT. CHECK DIFFERENT TIMES OF THE DAY  AT [facility] DAY.    Your physician recommends that you schedule a follow-up appointment in [place] physician recommends that you schedule a follow-up appointment in [place].  If you need a refill on your cardiac medications before your next appointment, please call your pharmacy.      Studies Ordered:   No orders of the defined types were placed in this encounter.     Glenetta Hew, M.D., M.S. Interventional Cardiologist   Pager # [phone] Phone # [phone] [address] [place], [place] [address]   Epworth Sleepiness Scale: Situation   Chance of Dozing/Sleeping (0 = never , 1 = slight chance , 2 = moderate chance , 3 = high chance )   sitting and reading 2   watching TV 3   sitting inactive in a public place 0   being a passenger in a motor vehicle for an hour or more 3   lying down in the afternoon 3   sitting and talking to someone 0   sitting quietly after lunch (no  alcohol) 2   while stopped for a few minutes in traffic as the driver 0   Total Score  13; HTN, insomnia, No sex drive & feels stressed with lack of motivation. + stops breathing @ night & snores - wakes up gasping for breath.. Wakes up with sore throat/dry mouth, excessively tired during the day - not rested when waking up.. + COPD. Can dose off at inappropriate times; frequent HA   Simply due to the shear # of complaints & her immediate concern re: palpitations & the murmur, I decided to "table" this discussion until f/u.  Glenetta Hew, MD

## 2017-01-23 NOTE — Patient Instructions (Signed)
MEDICATION CHANGES  TAKE 1/2 TABLET OF FUROSEMIDE IN THE MORNING , IF YOU BECOME SHORT OF BREATH OR SWELLING OCCUR MAY TAKE THE OTHER 1/2 TABLET LATER THAT DAY IF NEEDED   DO NOT TAKE MEDICATION UNTIL YOU EAT BREAKFAST IN THE MORNINGS.    CHECK BLOOD PRESSURE TAKE BLOOD PRESSURE  FOR THE M=NEXT WEEK AND BRING WITH YOU TO APPOINTMENT. CHECK DIFFERENT TIMES OF THE DAY  AT [facility] DAY.    Your physician recommends that you schedule a follow-up appointment in [place] physician recommends that you schedule a follow-up appointment in [place].  If you need a refill on your cardiac medications before your next appointment, please call your pharmacy.

## 2017-01-25 ENCOUNTER — Encounter: Payer: Self-pay | Admitting: Cardiology

## 2017-01-25 NOTE — Assessment & Plan Note (Signed)
Up-and-down blood pressures. Currently a little bit elevated. I am somewhat leery of titrating her pressure medications too much because of orthostatic hypotension episodes, but we may want to increase irbesartan at follow-up if her pressures remain elevated. Provided she takes her medication at least after her morning meal if not even after lunch.

## 2017-01-25 NOTE — Assessment & Plan Note (Signed)
New diagnosis which is clearly explain her palpitations. She did have lots of ectopy when I examined her during her last visit. Interestingly, her monitor only showed occasional PACs with short little runs of PAT no more than 4 beats. Rare PVCs noted.  Plan for now will be to continue carvedilol at current dose. Would move the morning dose of Avapro to ensure that his after meal to avoid the morning orthostatic hypotension episodes. We will back off on her furosemide dosage to one half tablet daily with the other one half tablet as needed.  She is on prazosin, will need to determine if this is necessary because it makes her susceptible to morning hypotension.  Also, ask when her the importance of adequate hydration, and avoiding standing up quickly.

## 2017-01-25 NOTE — Assessment & Plan Note (Signed)
Still not quite ready to discuss smoking cessation at this point, but is trying to cut back.

## 2017-01-25 NOTE — Assessment & Plan Note (Signed)
Dips of syncope that she had sounds like it was consistent with dehydration and related to alcohol. Stressed to her the importance of staying hydrated & taking her BP meds later in the AM.

## 2017-01-25 NOTE — Assessment & Plan Note (Signed)
More concern for HOCM than AS - will continue to monitor.

## 2017-01-25 NOTE — Assessment & Plan Note (Signed)
Mostly PACs on monitor, but not a lot -- could have been b/c her Carvedilol dose was increased.  Continue high dose Carvedilol.

## 2017-01-28 MED FILL — IRBESARTAN 150 MG TABLET: 150 | 30 days supply | Qty: 30 | Fill #1

## 2017-01-30 ENCOUNTER — Ambulatory Visit (INDEPENDENT_AMBULATORY_CARE_PROVIDER_SITE_OTHER): Payer: Medicare HMO | Admitting: Pharmacist Clinician (PhC)/ Clinical Pharmacy Specialist

## 2017-01-30 ENCOUNTER — Encounter: Payer: Self-pay | Admitting: Pharmacist Clinician (PhC)/ Clinical Pharmacy Specialist

## 2017-01-30 VITALS — BP 176/98 | HR 52

## 2017-01-30 DIAGNOSIS — I10 Essential (primary) hypertension: Secondary | ICD-10-CM | POA: Diagnosis not present

## 2017-01-30 MED ORDER — CHLORTHALIDONE 25 MG PO TABS: 12.5000 mg | ORAL_TABLET | Freq: Every day | ORAL | 3 refills | Status: DC

## 2017-01-30 NOTE — Assessment & Plan Note (Signed)
Patient with long history of hypertension, not currently controlled.  She has been on the irbesartan and carvedilol for about a month.  Will have her move the prazosin to bedtimes, as this could have contributed to her previous syncopal episode.   She is going to add chlorthalidone 12.5 mg once daily in the mornings.  Will repeat BMET in 10 days and see her back in the office in 3 weeks.  She will continue to monitor home BP readings up to twice daily and bring her information back at that time.

## 2017-01-30 NOTE — Progress Notes (Signed)
01/30/2017 Sarah Barnes [birth year] [id number]   HPI:  Sarah Barnes is a 66 y.o. female patient of Dr Ellyn Hack, with a PMH below who presents today for hypertension clinic evaluation.  Her cardiac history is significant for hypertrophic cardiomyopathy, mixed hyperlipidemia, hypertension, palpitations, and mild aortic stenosis.  In addition she also suffers from major depressive disorder, panic disorder with agrophobia, alcohol and tobacco abuse.  Since her visit with Dr. Ellyn Hack one week ago, she has quit drinking alcohol altogether.  She states she is feeling great and not having any craving issues.    She was admitted to [facility] hospital on Aug 7 for a syncopal event.  She had a L/R heart cath which showed no significant blockages and released the next day.  The syncopal event was decided to be related to an intravascular volume depletion and she was discharged.  Because of elevated BP, she was started on carvedilol 25 mg bid and irbesartan 150 mg.  Today she reports no problems with either medication and her palpitations have completely disappeared.    Blood Pressure Goal:  130/80  Current Medications:  carvedilol 25 bid   Prazosin 2 mg qam  Irbesartan 150 mg qam  Furosemide 40 mg qam  Family Hx:  Both parents hypertension - father died at 52 from aneurysm/stroke; mother at 3 from MI  Brother died from pancreatic cancer in his early 61's.   Social Hx:  Continues to smoke 1.5 ppd; just recently quit alcohol, states doing well with this, feeling better (had been drinking up to quart of vodka per day and 3-5 beers per day) ; 1 coffee per day, no sodas  Diet:  Drinks lots of water; some added salt with cooking, not at table; mostly home cooking ([place] foods); has started eating more salads as she and her sister (with whom she lives) are trying to lose weight.  Exercise:  No exercise; walks dog (chihuahua) 2-3 times per day  Home BP readings:  Reli-On machine (from [employer]),  bought 2 days ago.  Read within 10 points of office cuff.  Has only 2 home readings on this cuff 156/94 and 148/84.  Previous 4 readings were from older cuff that she believed to be inaccurate ([id number])  Intolerances:   NKDA  CrCl cannot be calculated (Patient's most recent lab result is older than the maximum 21 days allowed.).  Wt Readings from Last 3 Encounters:  01/23/17 186 lb 6.4 oz (84.6 kg)  01/03/17 193 lb 9.6 oz (87.8 kg)  12/28/16 201 lb 8 oz (91.4 kg)   BP Readings from Last 3 Encounters:  01/30/17 (!) 176/98  01/23/17 (!) 150/84  01/03/17 (!) 169/78   Pulse Readings from Last 3 Encounters:  01/30/17 (!) 52  01/23/17 64  01/03/17 64    Current Outpatient Prescriptions  Medication Sig Dispense Refill  . aspirin EC 81 MG EC tablet Take 1 tablet (81 mg total) by mouth daily. 30 tablet 1  . busPIRone (BUSPAR) 15 MG tablet Take 1 tablet (15 mg total) by mouth 2 (two) times daily. 60 tablet 0  . carvedilol (COREG) 25 MG tablet Take 1 tablet (25 mg total) by mouth 2 (two) times daily with a meal. 90 tablet 3  . chlorthalidone (HYGROTON) 25 MG tablet Take 0.5 tablets (12.5 mg total) by mouth daily. 15 tablet 3  . furosemide (LASIX) 40 MG tablet TAKE 1 TABLET EVERY DAY 90 tablet 0  . irbesartan (AVAPRO) 150 MG tablet Take 1 tablet (  150 mg total) by mouth daily. 30 tablet 1  . Multiple Vitamin (MULTIVITAMIN WITH MINERALS) TABS tablet Take 1 tablet by mouth daily.    . nicotine (NICODERM CQ - DOSED IN MG/24 HOURS) 21 mg/24hr patch Place 1 patch (21 mg total) onto the skin daily. 28 patch 0  . pantoprazole (PROTONIX) 40 MG tablet TAKE 1 TABLET EVERY DAY 90 tablet 0  . prazosin (MINIPRESS) 2 MG capsule TAKE 1 CAPSULE EVERY MORNING 90 capsule 0  . traZODone (DESYREL) 100 MG tablet TAKE 1 TABLET AT BEDTIME (Patient taking differently: TAKE 1 TABLET by mouth every morning for anxiety) 90 tablet 3   No current facility-administered medications for this visit.     No Known  Allergies  Past Medical History:  Diagnosis Date  . Agoraphobia with panic disorder   . Anemia   . Arthritis    "all over" (08/13/2016)  . Chronic bronchitis ([place])   . COPD (chronic obstructive pulmonary disease) ([place])   . Depression   . Dyspnea    occ  . Dysrhythmia    palpitations occ due to anxiety  . ETOH abuse   . GAD (generalized anxiety disorder)   . GERD (gastroesophageal reflux disease)   . Headache    "daily til I got glasses; now have headache once in awhile" (08/13/2016)  . Hepatitis 1960   "isolated &  hospitalized for 2 weeks; don't know which kind of hepatitis"   . Hernia, hiatal   . High cholesterol   . Hypertension   . Pneumonia    "once or twice" (08/13/2016)  . PONV (postoperative nausea and vomiting)   . Sciatica     Blood pressure (!) 176/98, pulse (!) 52.  Standing 160/100, Right 166/102  Essential hypertension Patient with long history of hypertension, not currently controlled.  She has been on the irbesartan and carvedilol for about a month.  Will have her move the prazosin to bedtimes, as this could have contributed to her previous syncopal episode.   She is going to add chlorthalidone 12.5 mg once daily in the mornings.  Will repeat BMET in 10 days and see her back in the office in 3 weeks.  She will continue to monitor home BP readings up to twice daily and bring her information back at that time.     Tommy Medal PharmD CPP [facility]

## 2017-01-30 NOTE — Patient Instructions (Signed)
Return for a a follow up appointment in 3 weeks  Your blood pressure today is 176/98  (goal is 130/80)  Check your blood pressure at home daily and keep record of the readings.  Take your BP meds as follows:  AM:  Irbesartan 150 mg, carvedilol 25 mg, chlorthalidone 12.5 mg  PM:  Prazosin 2mg , carvedilol 25 mg  Bring all of your meds, your BP cuff and your record of home blood pressures to your next appointment.  Exercise as you're able, try to walk approximately 30 minutes per day.  Keep salt intake to a minimum, especially watch canned and prepared boxed foods.  Eat more fresh fruits and vegetables and fewer canned items.  Avoid eating in fast food restaurants.    HOW TO TAKE YOUR BLOOD PRESSURE: . Rest 5 minutes before taking your blood pressure. .  Don't smoke or drink caffeinated beverages for at least 30 minutes before. . Take your blood pressure before (not after) you eat. . Sit comfortably with your back supported and both feet on the floor (don't cross your legs). . Elevate your arm to heart level on a table or a desk. . Use the proper sized cuff. It should fit smoothly and snugly around your bare upper arm. There should be enough room to slip a fingertip under the cuff. The bottom edge of the cuff should be 1 inch above the crease of the elbow. . Ideally, take 3 measurements at one sitting and record the average.

## 2017-02-05 NOTE — Telephone Encounter (Signed)
Received signed domestic return receipt verifying delivery of certified letter on January 26, 2017 Article number [id number] [place]

## 2017-02-06 ENCOUNTER — Other Ambulatory Visit (INDEPENDENT_AMBULATORY_CARE_PROVIDER_SITE_OTHER): Payer: Medicare HMO

## 2017-02-06 DIAGNOSIS — I10 Essential (primary) hypertension: Secondary | ICD-10-CM

## 2017-02-07 LAB — BASIC METABOLIC PANEL
BUN / CREAT RATIO: 20 (ref 12–28)
BUN: 11 mg/dL (ref 8–27)
CO2: 23 mmol/L (ref 20–29)
CREATININE: 0.54 mg/dL — AB (ref 0.57–1.00)
Calcium: 9.8 mg/dL (ref 8.7–10.3)
Chloride: 97 mmol/L (ref 96–106)
GFR calc Af Amer: 112 mL/min/{1.73_m2} (ref >59)
GFR calc non Af Amer: 97 mL/min/{1.73_m2} (ref >59)
Glucose: 146 mg/dL — ABNORMAL HIGH (ref 65–99)
Potassium: 3.4 mmol/L — ABNORMAL LOW (ref 3.5–5.2)
SODIUM: 140 mmol/L (ref 134–144)

## 2017-02-07 LAB — PLEASE NOTE

## 2017-02-12 ENCOUNTER — Ambulatory Visit: Payer: Medicare HMO | Admitting: Cardiology

## 2017-02-13 ENCOUNTER — Telehealth: Payer: Self-pay | Admitting: *Deleted

## 2017-02-13 NOTE — Telephone Encounter (Signed)
-----   Message from Leonie Man, MD sent at 02/08/2017  2:05 PM EDT ----- Labs look pretty stable. Potassium level is stable. Kidney function is stable. We seem to be doing well with current medications  Glenetta Hew, MD

## 2017-02-13 NOTE — Telephone Encounter (Signed)
LEFT MESSAGE TO CALL BACK- IN REGARDS TO LAB

## 2017-02-13 NOTE — Telephone Encounter (Signed)
Spoke to patient. Result given . Verbalized understanding  

## 2017-02-20 NOTE — Progress Notes (Signed)
HPI:  Sarah Barnes is a 66 y.o. female patient of Dr Sarah Barnes, with a PMH below who presents today for hypertension clinic follow up.  Her cardiac history is significant for hypertrophic cardiomyopathy, mixed hyperlipidemia, hypertension, palpitations, and mild aortic stenosis.  In addition she also suffers from major depressive disorder, panic disorder with agrophobia, alcohol and tobacco abuse.   She was admitted to [name] hospital on Aug 7 for a syncopal event.  She had a L/R heart cath which showed no significant blockages and released the next day.  The syncopal event was decided to be related to an intravascular volume depletion and she was discharged.    During most recent OV chlorthalidone 12.5mg  daily was initiated. BMET repeat was completed 2 weeks after chlorthalidone initiation and showed stable renal function and electrolytes levels. Patient is drinking again but report only occasional beer and "lot less that before". Reports increase dizziness every time her BP drops from systolic 485I. She is reporting decrease in physical activity due to increase fear of falling. Denies any other problems with current therapy.  Blood Pressure Goal:  130/80  Current Medications:  carvedilol 25 twice daily  Prazosin 2 mg at bedtime  Irbesartan 150 mg every morning  Furosemide 40 mg every morning  Chlorthalidone 12.5mg  every morning  Family Hx:  Both parents hypertension - father died at 3 from aneurysm/stroke; mother at 53 from MI  Brother died from pancreatic cancer in his early 67's.   Social Hx:  Continues to smoke 1.5 ppd; just recently quit alcohol, states doing well with this, feeling better (had been drinking up to quart of vodka per day and 3-5 beers per day) ; 1 coffee per day, no sodas  Diet:  Drinks lots of water; some added salt with cooking, not at table; mostly home cooking ([place] foods); has started eating more salads as she and her sister (with whom she lives) are  trying to lose weight.  Exercise:  No exercise; walks dog (chihuahua) 2-3 times per day  Home BP readings: 20 readings; average 129/88 (pulse 57-75 bpm) **Reli-On machine (from [employer]), bought 2 days ago.  Read within 10 points of office cuff**   Estimated Creatinine Clearance: 69.1 mL/min (A) (by C-G formula based on SCr of 0.54 mg/dL (L)).  Wt Readings from Last 3 Encounters:  02/21/17 185 lb 3.2 oz (84 kg)  01/23/17 186 lb 6.4 oz (84.6 kg)  01/03/17 193 lb 9.6 oz (87.8 kg)   BP Readings from Last 3 Encounters:  02/21/17 108/72  01/30/17 (!) 176/98  01/23/17 (!) 150/84   Pulse Readings from Last 3 Encounters:  02/21/17 60  01/30/17 (!) 52  01/23/17 64    Current Outpatient Prescriptions  Medication Sig Dispense Refill  . aspirin EC 81 MG EC tablet Take 1 tablet (81 mg total) by mouth daily. 30 tablet 1  . busPIRone (BUSPAR) 15 MG tablet Take 1 tablet (15 mg total) by mouth 2 (two) times daily. 60 tablet 0  . carvedilol (COREG) 25 MG tablet Take 1 tablet (25 mg total) by mouth 2 (two) times daily with a meal. 90 tablet 3  . furosemide (LASIX) 40 MG tablet TAKE 1 TABLET EVERY DAY 90 tablet 0  . hydrochlorothiazide (MICROZIDE) 12.5 MG capsule Take 1 capsule (12.5 mg total) by mouth daily. 30 capsule 1  . irbesartan (AVAPRO) 150 MG tablet Take 1 tablet (150 mg total) by mouth daily. 30 tablet 1  . Multiple Vitamin (MULTIVITAMIN WITH MINERALS)  TABS tablet Take 1 tablet by mouth daily.    . nicotine (NICODERM CQ - DOSED IN MG/24 HOURS) 21 mg/24hr patch Place 1 patch (21 mg total) onto the skin daily. 28 patch 0  . pantoprazole (PROTONIX) 40 MG tablet TAKE 1 TABLET EVERY DAY 90 tablet 0  . prazosin (MINIPRESS) 2 MG capsule TAKE 1 CAPSULE EVERY MORNING 90 capsule 0  . traZODone (DESYREL) 100 MG tablet TAKE 1 TABLET AT BEDTIME (Patient taking differently: TAKE 1 TABLET by mouth every morning for anxiety) 90 tablet 3   No current facility-administered medications for this visit.      No Known Allergies  Past Medical History:  Diagnosis Date  . Agoraphobia with panic disorder   . Anemia   . Arthritis    "all over" (08/13/2016)  . Chronic bronchitis ([place])   . COPD (chronic obstructive pulmonary disease) ([place])   . Depression   . Dyspnea    occ  . Dysrhythmia    palpitations occ due to anxiety  . ETOH abuse   . GAD (generalized anxiety disorder)   . GERD (gastroesophageal reflux disease)   . Headache    "daily til I got glasses; now have headache once in awhile" (08/13/2016)  . Hepatitis 1960   "isolated &  hospitalized for 2 weeks; don't know which kind of hepatitis"   . Hernia, hiatal   . High cholesterol   . Hypertension   . Pneumonia    "once or twice" (08/13/2016)  . PONV (postoperative nausea and vomiting)   . Sciatica     Blood pressure 108/72, pulse 60, weight 185 lb 3.2 oz (84 kg).    Essential hypertension Blood pressure is well controlled today but patient is reporting increased fatigue and dizziness with lower BP readings. She is unable to walk much due to fear of falls due to dizziness.  Patient also report problems cutting her chlorthalidone tablets. Will change chlorthalidone to HCTZ 12.5mg  to decrease diuretic intensity. Patient to continue twice daily blood pressure monitoring and keep records to bring to next f/u in 4 weeks. Will repeat BMET during next office visit.  Sarah Barnes PharmD, BCPS, [place] [id number] 02/21/2017 9:41 PM

## 2017-02-21 ENCOUNTER — Ambulatory Visit (INDEPENDENT_AMBULATORY_CARE_PROVIDER_SITE_OTHER): Payer: Medicare HMO | Admitting: Pharmacist

## 2017-02-21 VITALS — BP 108/72 | HR 60 | Wt 185.2 lb

## 2017-02-21 DIAGNOSIS — I10 Essential (primary) hypertension: Secondary | ICD-10-CM

## 2017-02-21 MED ORDER — HYDROCHLOROTHIAZIDE 12.5 MG PO CAPS: 12.5000 mg | ORAL_CAPSULE | Freq: Every day | ORAL | 1 refills | Status: DC

## 2017-02-21 NOTE — Patient Instructions (Addendum)
Return for a  follow up appointment in 4 weeks  Your blood pressure today is 108/72 pulse 60  Check your blood pressure at home daily (if able) and keep record of the readings.  Take your BP meds as follows: *Discontinue chlorthalidone* *START taking hydrochlorothiazide 12.5mg  every morning* *Continue all other medications previously prescribed*  Bring your BP cuff and your record of home blood pressures to your next appointment.  Exercise as you're able, try to walk approximately 30 minutes per day.  Keep salt intake to a minimum, especially watch canned and prepared boxed foods.  Eat more fresh fruits and vegetables and fewer canned items.  Avoid eating in fast food restaurants.    HOW TO TAKE YOUR BLOOD PRESSURE: . Rest 5 minutes before taking your blood pressure. .  Don't smoke or drink caffeinated beverages for at least 30 minutes before. . Take your blood pressure before (not after) you eat. . Sit comfortably with your back supported and both feet on the floor (don't cross your legs). . Elevate your arm to heart level on a table or a desk. . Use the proper sized cuff. It should fit smoothly and snugly around your bare upper arm. There should be enough room to slip a fingertip under the cuff. The bottom edge of the cuff should be 1 inch above the crease of the elbow. . Ideally, take 3 measurements at one sitting and record the average.

## 2017-02-21 NOTE — Assessment & Plan Note (Signed)
Blood pressure is well controlled today but patient is reporting increased fatigue and dizziness with lower BP readings. She is unable to walk much due to fear of falls due to dizziness.  Patient also report problems cutting her chlorthalidone tablets. Will change chlorthalidone to HCTZ 12.5mg  to decrease diuretic intensity. Patient to continue twice daily blood pressure monitoring and keep records to bring to next f/u in 4 weeks. Will repeat BMET during next office visit.

## 2017-02-26 ENCOUNTER — Other Ambulatory Visit: Payer: Self-pay | Admitting: *Deleted

## 2017-02-26 MED ORDER — IRBESARTAN 150 MG PO TABS: 150.0000 mg | ORAL_TABLET | Freq: Every day | ORAL | 1 refills | Status: DC

## 2017-03-13 MED FILL — CARVEDILOL 25 MG TABLET: 25 | 45 days supply | Qty: 90 | Fill #1 | Status: TO

## 2017-03-19 ENCOUNTER — Ambulatory Visit: Payer: Medicare HMO

## 2017-03-20 ENCOUNTER — Other Ambulatory Visit: Payer: Self-pay

## 2017-03-20 MED ORDER — HYDROCHLOROTHIAZIDE 12.5 MG PO CAPS: 12.5000 mg | ORAL_CAPSULE | Freq: Every day | ORAL | 1 refills | Status: DC

## 2017-03-21 ENCOUNTER — Other Ambulatory Visit: Payer: Self-pay

## 2017-03-21 ENCOUNTER — Ambulatory Visit: Payer: Medicare HMO

## 2017-03-21 MED ORDER — IRBESARTAN 150 MG PO TABS: 150.0000 mg | ORAL_TABLET | Freq: Every day | ORAL | 1 refills | Status: DC

## 2017-04-17 ENCOUNTER — Other Ambulatory Visit: Payer: Self-pay | Admitting: Cardiology

## 2017-04-24 ENCOUNTER — Encounter: Payer: Self-pay | Admitting: Cardiology

## 2017-04-24 ENCOUNTER — Ambulatory Visit: Payer: Medicare HMO | Admitting: Cardiology

## 2017-04-24 VITALS — BP 120/68 | HR 65 | Ht 63.0 in | Wt 185.0 lb

## 2017-04-24 DIAGNOSIS — R55 Syncope and collapse: Secondary | ICD-10-CM | POA: Diagnosis not present

## 2017-04-24 DIAGNOSIS — R002 Palpitations: Secondary | ICD-10-CM | POA: Diagnosis not present

## 2017-04-24 DIAGNOSIS — I350 Nonrheumatic aortic (valve) stenosis: Secondary | ICD-10-CM | POA: Diagnosis not present

## 2017-04-24 DIAGNOSIS — E781 Pure hyperglyceridemia: Secondary | ICD-10-CM | POA: Diagnosis not present

## 2017-04-24 DIAGNOSIS — I10 Essential (primary) hypertension: Secondary | ICD-10-CM | POA: Diagnosis not present

## 2017-04-24 DIAGNOSIS — F17200 Nicotine dependence, unspecified, uncomplicated: Secondary | ICD-10-CM

## 2017-04-24 DIAGNOSIS — I421 Obstructive hypertrophic cardiomyopathy: Secondary | ICD-10-CM

## 2017-04-24 MED ORDER — FENOFIBRATE 134 MG PO CAPS: 134.0000 mg | ORAL_CAPSULE | Freq: Every day | ORAL | 11 refills | Status: DC

## 2017-04-24 MED ORDER — TRAZODONE HCL 100 MG PO TABS: 100.0000 mg | ORAL_TABLET | Freq: Every day | ORAL | 3 refills | Status: DC

## 2017-04-24 NOTE — Progress Notes (Signed)
PCP: Patient, No Pcp Per  Clinic Note: Chief Complaint  Patient presents with  . Follow-up    pt c/o tiredness, dizziness and pain in legs, feet and Groins    HPI: Sarah Barnes is a 66 y.o. female with a PMH below who presents today for follow-up of hypertrophic cardiomyopathy with frequent palpitations. Initially seen in consultation for palpitations, dyspnea and dizziness.  She was profoundly hypertensive she subsequently was admitted to the hospital for a syncopal episode related to dehydration.  She actually underwent cardiac catheterization.  Revealing normal coronary arteries.  A 2D echocardiogram suggested possible hypertrophic cardiomyopathy with a peak outflow gradient of 52 mmHg.  Cardiac MRI: Hypertrophic cardiomyopathy with septal thickness of 19 mm. EF 72%, moderate MR. . Moderate LAD. Partial fusion of right and left cusps of the aortic valve- no stenosis. LVOT turbulence , transient SAM  Sarah Barnes has generalized anxiety disorder and clearcut Agoraphobia. 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    Sarah Barnes last seen on September 5 following of her hospitalization stay.  She actually was doing fairly well.  Palpitations were improved, but she still had some dizziness.  She was working hard to stay hydrated.  Recent Hospitalizations:   None  Studies Personally Reviewed - (if available, images/films reviewed: From Epic Chart or Care Everywhere)  None   Interval History: Sarah Barnes presents actually feeling pretty good.  She notes that she still feels "tired all the time ", but says the palpitations have improved.  She really only notes the palpitations after she eats or if she has not been drinking enough.  She notes that her legs and feet feel somewhat fatigued and tired.  She is also noted episodes of dizziness which also seem to have been improved.  She has not had  any recent falls.  No syncope or near syncope, TIA or amaurosis fugax.  No further chest tightness or pressure with exertion.  She does get exertional dyspnea if she over exerts, but not with routine activity.  No significant edema, PND orthopnea.  ROS: A comprehensive was performed. Review of Systems  Constitutional: Positive for malaise/fatigue (Feeling better).       Recent URI symptoms were sore throat and coughing.  HENT: Negative for congestion, nosebleeds and sore throat.   Respiratory: Positive for cough (Related to smoking now), shortness of breath and wheezing (Intermittent). Negative for sputum production.        URI symptoms notably improving  Cardiovascular:       Per history of present illness  Gastrointestinal: Positive for heartburn. Negative for blood in stool, constipation and melena.  Genitourinary: Positive for frequency (She does note frequent nocturia as well.). Negative for dysuria.  Musculoskeletal: Positive for back pain and joint pain. Negative for falls.  Neurological: Positive for dizziness. Negative for focal weakness, seizures, loss of consciousness and weakness.  Endo/Heme/Allergies: Positive for environmental allergies.  Psychiatric/Behavioral: Positive for depression. The patient is nervous/anxious and has insomnia.        Her level of anxiety is notably improved  All other systems reviewed and are negative.  I have reviewed and (if needed) personally updated the patient's problem list, medications, allergies, past medical and surgical history, social and family history.   Past Medical History:  Diagnosis Date  . Agoraphobia with panic disorder   . Anemia   . Arthritis    "  all over" (08/13/2016)  . Chronic bronchitis ([place])   . COPD (chronic obstructive pulmonary disease) ([place])   . Depression   . Dyspnea    occ  . Dysrhythmia    palpitations occ due to anxiety  . ETOH abuse   . GAD (generalized anxiety disorder)   . GERD (gastroesophageal reflux  disease)   . Headache    "daily til I got glasses; now have headache once in awhile" (08/13/2016)  . Hepatitis 1960   "isolated &  hospitalized for 2 weeks; don't know which kind of hepatitis"   . Hernia, hiatal   . High cholesterol   . Hypertension   . Hypertrophic obstructive cardiomyopathy ([place])   . Mild aortic stenosis   . Pneumonia    "once or twice" (08/13/2016)  . PONV (postoperative nausea and vomiting)   . Sciatica   She has anxiety and depression. Pt struggles with her mood for years. She recently got emotional support Dog. She states dog is "registered". She has had dog for a month and mood is improved.   Past Surgical History:  Procedure Laterality Date  . ANAL FISTULECTOMY  1970s X 3  . BACK SURGERY    . HERNIA REPAIR    . KNEE ARTHROSCOPY Right   . LAPAROSCOPIC CHOLECYSTECTOMY    . LAPAROSCOPIC INCISIONAL / UMBILICAL / VENTRAL HERNIA REPAIR  08/13/2016   VHR w/mesh  . LUMBAR DISC SURGERY     "herniated disc"  . NASAL FRACTURE SURGERY  1970s X 2  . RIGHT/LEFT HEART CATH AND CORONARY ANGIOGRAPHY N/A 12/27/2016   Procedure: RIGHT/LEFT HEART CATH AND CORONARY ANGIOGRAPHY;  Surgeon: Leonie Man, MD;  Location: [facility]: Mild-moderate pulmonary hypertension.  Hyperdynamic ventricle noted.  EF 55-65% -hyperdynamic (unable to measure LVOT gradient).  Mildly elevated very tortuous but angiographically normal coronary arteries, suggesting hypertensive heart disease  . SHOULDER ARTHROSCOPY WITH ROTATOR CUFF REPAIR Right 1990s?  . TONSILLECTOMY  1951  . TRANSTHORACIC ECHOCARDIOGRAM  12/2016    EF 60-65% with dynamic outflow tract obstruction at rest. Peak gradient 52 mmHg consistent with HOCM.  GRII DD area. Aortic sclerosis but no stenosis. Systolic anterior motion of mitral valve chordae. Mild mitral stenosis. Moderate LA dilation and mild RA dilation. Peak PA pressures 35 mmHg.  Marland Kitchen VENTRAL HERNIA REPAIR N/A 08/13/2016   Procedure: LAPAROSCOPIC VENTRAL HERNIA REPAIR  WITH MESH;  Surgeon: Judeth Horn, MD;  Location: [facility];  Service: General;  Laterality: N/A;    48 hour monitor: 6 short runs of PACs less than 4 beats. Otherwise normal Diagnostic Diagram Cath 12/2016     Current Meds  Medication Sig  . aspirin EC 81 MG EC tablet Take 1 tablet (81 mg total) by mouth daily.  . busPIRone (BUSPAR) 15 MG tablet Take 1 tablet (15 mg total) by mouth 2 (two) times daily.  . carvedilol (COREG) 25 MG tablet Take 1 tablet (25 mg total) by mouth 2 (two) times daily with a meal.  . furosemide (LASIX) 40 MG tablet TAKE 1 TABLET EVERY DAY  . hydrochlorothiazide (MICROZIDE) 12.5 MG capsule Take 1 capsule (12.5 mg total) by mouth daily.  . irbesartan (AVAPRO) 150 MG tablet Take 1 tablet (150 mg total) by mouth daily.  . Multiple Vitamin (MULTIVITAMIN WITH MINERALS) TABS tablet Take 1 tablet by mouth daily.  . nicotine (NICODERM CQ - DOSED IN MG/24 HOURS) 21 mg/24hr patch Place 1 patch (21 mg total) onto the skin daily.  . pantoprazole (PROTONIX) 40 MG tablet  TAKE 1 TABLET EVERY DAY  . prazosin (MINIPRESS) 2 MG capsule TAKE 1 CAPSULE EVERY MORNING  . traZODone (DESYREL) 100 MG tablet Take 1 tablet (100 mg total) by mouth at bedtime.  . [DISCONTINUED] hydrochlorothiazide (MICROZIDE) 12.5 MG capsule TAKE 1 CAPSULE BY MOUTH EVERY DAY  . [DISCONTINUED] traZODone (DESYREL) 100 MG tablet TAKE 1 TABLET AT BEDTIME (Patient taking differently: TAKE 1 TABLET by mouth every morning for anxiety)    No Known Allergies  Social History   Socioeconomic History  . Marital status: Single    Spouse name: None  . Number of children: None  . Years of education: None  . Highest education level: None  Social Needs  . Financial resource strain: None  . Food insecurity - worry: None  . Food insecurity - inability: None  . Transportation needs - medical: None  . Transportation needs - non-medical: None  Occupational History  . None  Tobacco Use  . Smoking status: Current Every Day  Smoker    Packs/day: 1.50    Years: 49.00    Pack years: 73.50    Types: Cigarettes  . Smokeless tobacco: Never Used  Substance and Sexual Activity  . Alcohol use: Yes    Alcohol/week: 50.4 oz    Types: 36 Cans of beer, 48 Shots of liquor per week    Comment: 08/13/2016 "couple beers-1 quart vodka daily; depends on my mood; 3, 12 packs of beer/week 1 1/2 quarts vodka/week""  . Drug use: Yes    Frequency: 1.0 times per week    Types: Marijuana, Cocaine    Comment: Cocaine + March 2017  . Sexual activity: Not Currently  Other Topics Concern  . None  Social History Narrative   Sarah Barnes has generalized anxiety disorder and clearcut Agoraphobia.   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    She currently lives with her sister.   Currently denies substance abuse beyond Tobacco (1-1/2 PPD) & EtOH (beer, vodka) - not ready to discuss quitting.    family history includes Angina in her mother; Cancer in her brother; Diabetes in her mother; Heart attack (age of onset: 5) in her mother; Hyperlipidemia in her mother; Hypertension in her father and mother; Stroke in her father.  Wt Readings from Last 3 Encounters:  04/24/17 185 lb (83.9 kg)  02/21/17 185 lb 3.2 oz (84 kg)  01/23/17 186 lb 6.4 oz (84.6 kg)    PHYSICAL EXAM BP 120/68   Pulse 65   Ht 5\' 3"  (1.6 m)   Wt 185 lb (83.9 kg)   SpO2 96%   BMI 32.77 kg/m  Physical Exam  Constitutional: She is oriented to person, place, and time. She appears well-developed and well-nourished. No distress.  Well-groomed,   HENT:  Head: Normocephalic and atraumatic.  Mouth/Throat: No oropharyngeal exudate.  Eyes: EOM are normal.  Neck: Normal range of motion. Neck supple. No hepatojugular reflux and no JVD present. Carotid bruit is not present.  Cardiovascular: Normal rate, regular rhythm, S1 normal, S2 normal and intact distal pulses.  No  extrasystoles are present. PMI is not displaced. Exam reveals no gallop, no friction rub and no opening snap.  Murmur heard.  Harsh crescendo-decrescendo midsystolic murmur is present with a grade of 3/6 at the upper right sternal border radiating to the neck. The intensity decreases with valsalva. Pulmonary/Chest: No accessory muscle usage (At baseline,  bucket-handle breathing). No respiratory distress. She has decreased breath sounds (Diffusely diminished breath sounds with intermittent expiratory wheeze and interstitial sounds. No rales or rhonchi). She has no wheezes.  Abdominal: Soft. Bowel sounds are normal. She exhibits no distension. There is no tenderness. There is no rebound.  Musculoskeletal: Normal range of motion. She exhibits no edema or deformity.  Neurological: She is alert and oriented to person, place, and time. No cranial nerve deficit. Coordination normal.  Skin: Skin is warm and dry. No rash noted. No erythema.  Psychiatric: She has a normal mood and affect. Her behavior is normal. Judgment and thought content normal.  Much clearer in her thought process and answering questions. She seems to be much more relaxed and calm today. She is not accompanied by her sister and seems very sure of herself.  Nursing note and vitals reviewed.    Adult ECG Report No EKG  Other studies Reviewed: Additional studies/ records that were reviewed today include:  Recent Labs:   Lab Results  Component Value Date   CHOL 259 (H) 12/27/2016   HDL 35 (L) 12/27/2016   LDLCALC UNABLE TO CALCULATE IF TRIGLYCERIDE OVER 400 mg/dL 12/27/2016   LDLDIRECT 66.0 10/20/2015   TRIG 854 (H) 12/27/2016   CHOLHDL 7.4 12/27/2016    Lab Results  Component Value Date   CREATININE 0.54 (L) 02/06/2017   BUN 11 02/06/2017   NA 140 02/06/2017   K 3.4 (L) 02/06/2017   CL 97 02/06/2017   CO2 23 02/06/2017   Lab Results  Component Value Date   CKTOTAL 85 07/06/2015   TROPONINI 0.13 ([place]) 12/26/2016     ASSESSMENT / PLAN: Problem List Items Addressed This Visit    Essential hypertension (Chronic)    Overall stable.  We do not want to be overly aggressive with blood pressure control.  The  Continue current meds, would low threshold to discontinue HCTZ.      Relevant Medications   fenofibrate micronized (LOFIBRA) 134 MG capsule   Heart palpitations - PACs (Chronic)    Her monitor was not overly exciting, but in the setting of hypertrophic cardiomyopathy, she is set up for ventricular arrhythmias as well as atrial.  Continue high-dose beta-blocker for now.      Hypertriglyceridemia (Chronic)    Her triglycerides are very high.  We will need to start with Fenofibrate and then reassess.  Recheck labs in 3 months.      Relevant Medications   fenofibrate micronized (LOFIBRA) 134 MG capsule   Other Relevant Orders   Lipid panel   Comprehensive metabolic panel   Hypertrophic obstructive cardiomyopathy ([place]) - Primary (Chronic)    Seemingly doing better from a palpitation standpoint on labetalol.  We need to carefully treat her hypertension with afterload reduction while at the same time needing to ensure that she stays adequately hydrated.  She will be very susceptible to dehydration.  --Plan is to continue taking Avapro after breakfast reading after lunch.  Minimize usage of diuretic such Lasix . -->  She is not eating to do well, she should not take Lasix      Relevant Medications   fenofibrate micronized (LOFIBRA) 134 MG capsule   Other Relevant Orders   Comprehensive metabolic panel   Mild aortic stenosis (Chronic)    Her aortic valve is not the most concerning feature of her echocardiogram.  The murmur is probably more related to outflow tract flow disruption then actually the valve itself.      Relevant Medications  fenofibrate micronized (LOFIBRA) 134 MG capsule   Other Relevant Orders   Comprehensive metabolic panel   Syncope    Most likely her syncope was related to  being dehydrated and somewhat intoxicated with alcohol combined with hypertrophic myopathy.  She is volume dependent. Next again stressed the importance of adequate hydration and actually having to eat      Relevant Medications   fenofibrate micronized (LOFIBRA) 134 MG capsule   Tobacco use disorder (Chronic)    Not ready to quit.  Needs to initially try to cut back        Still not quite ready to discuss smoking cessation at this point, but is trying to cut back.  Current medicines are reviewed at length with the patient today. (+/- concerns) n/a The following changes have been made: n/a  Patient Instructions  Medication instruction  ---start Fenofibrate 134 mg  One capsules daily,but  for the first week take every other day    Labs in 3 months - do not eat or drink the morning of the test Will mail labslip to you. You may use any [facility] [employer]  CMP LIPID    Your handicap placard was signed.  Your physician recommends that you schedule a follow-up appointment in 3 months with DR Sarah Barnes. - AFTER LABS ARE COMPLETED.    Studies Ordered:   Orders Placed This Encounter  Procedures  . Lipid panel  . Comprehensive metabolic panel      Glenetta Hew, M.D., M.S. Interventional Cardiologist   Pager # [phone] Phone # [phone] [address] [place], [place] [address]   Epworth Sleepiness Scale: Situation   Chance of Dozing/Sleeping (0 = never , 1 = slight chance , 2 = moderate chance , 3 = high chance )   sitting and reading 2   watching TV 3   sitting inactive in a public place 0   being a passenger in a motor vehicle for an hour or more 3   lying down in the afternoon 3   sitting and talking to someone 0   sitting quietly after lunch (no alcohol) 2   while stopped for a few minutes in traffic as the driver 0   Total Score  13; HTN, insomnia, No sex drive & feels stressed with lack of motivation. + stops breathing @ night & snores - wakes up  gasping for breath.. Wakes up with sore throat/dry mouth, excessively tired during the day - not rested when waking up.. + COPD. Can dose off at inappropriate times; frequent HA   Simply due to the shear # of complaints & her immediate concern re: palpitations & the murmur, I decided to "table" this discussion until f/u.  Glenetta Hew, MD

## 2017-04-24 NOTE — Patient Instructions (Addendum)
Medication instruction  ---start Fenofibrate 134 mg  One capsules daily,but  for the first week take every other day    Labs in 3 months - do not eat or drink the morning of the test Will mail labslip to you. You may use any [facility]  CMP LIPID    Your handicap placard was signed.  Your physician recommends that you schedule a follow-up appointment in 3 months with DR HARDING. - AFTER LABS ARE COMPLETED.

## 2017-04-26 ENCOUNTER — Encounter: Payer: Self-pay | Admitting: Cardiology

## 2017-04-26 NOTE — Assessment & Plan Note (Signed)
Overall stable.  We do not want to be overly aggressive with blood pressure control.  The  Continue current meds, would low threshold to discontinue HCTZ.

## 2017-04-26 NOTE — Assessment & Plan Note (Signed)
Most likely her syncope was related to being dehydrated and somewhat intoxicated with alcohol combined with hypertrophic myopathy.  She is volume dependent. Next again stressed the importance of adequate hydration and actually having to eat

## 2017-04-26 NOTE — Assessment & Plan Note (Signed)
Her monitor was not overly exciting, but in the setting of hypertrophic cardiomyopathy, she is set up for ventricular arrhythmias as well as atrial.  Continue high-dose beta-blocker for now.

## 2017-04-26 NOTE — Assessment & Plan Note (Signed)
Her aortic valve is not the most concerning feature of her echocardiogram.  The murmur is probably more related to outflow tract flow disruption then actually the valve itself.

## 2017-04-26 NOTE — Assessment & Plan Note (Signed)
Not ready to quit.  Needs to initially try to cut back

## 2017-04-26 NOTE — Assessment & Plan Note (Signed)
Seemingly doing better from a palpitation standpoint on labetalol.  We need to carefully treat Sarah Barnes hypertension with afterload reduction while at the same time needing to ensure that she stays adequately hydrated.  She will be very susceptible to dehydration.  --Plan is to continue taking Avapro after breakfast reading after lunch.  Minimize usage of diuretic such Lasix . -->  She is not eating to do well, she should not take Lasix

## 2017-04-26 NOTE — Assessment & Plan Note (Signed)
Her triglycerides are very high.  We will need to start with Fenofibrate and then reassess.  Recheck labs in 3 months.

## 2017-05-19 ENCOUNTER — Other Ambulatory Visit: Payer: Self-pay | Admitting: Cardiology

## 2017-06-26 ENCOUNTER — Telehealth: Payer: Self-pay | Admitting: *Deleted

## 2017-06-26 DIAGNOSIS — I421 Obstructive hypertrophic cardiomyopathy: Secondary | ICD-10-CM

## 2017-06-26 DIAGNOSIS — E781 Pure hyperglyceridemia: Secondary | ICD-10-CM

## 2017-06-26 DIAGNOSIS — I350 Nonrheumatic aortic (valve) stenosis: Secondary | ICD-10-CM

## 2017-06-26 NOTE — Telephone Encounter (Signed)
-----   Message from Raiford Simmonds, RN sent at 04/24/2017  8:51 AM EST ----- Mail lab slip cmp, lipid 06/25/17 Due 07/23/17

## 2017-06-26 NOTE — Telephone Encounter (Signed)
MAIL LETTER AND LABSLIP 

## 2017-07-04 ENCOUNTER — Telehealth: Payer: Self-pay | Admitting: Cardiology

## 2017-07-04 MED ORDER — FUROSEMIDE 40 MG PO TABS: 40.0000 mg | ORAL_TABLET | Freq: Every day | ORAL | 1 refills | Status: DC

## 2017-07-04 MED ORDER — PANTOPRAZOLE SODIUM 40 MG PO TBEC: 40.0000 mg | DELAYED_RELEASE_TABLET | Freq: Every day | ORAL | 1 refills | Status: DC

## 2017-07-04 NOTE — Telephone Encounter (Signed)
New Message    *STAT* If patient is at the pharmacy, call can be transferred to refill team.   1. Which medications need to be refilled? (please list name of each medication and dose if known) pantoprazole (PROTONIX) 40 MG tablet and furosemide (LASIX) 40 MG tablet  2. Which pharmacy/location (including street and city if local pharmacy) is medication to be sent to? [facility]   3. Do they need a 30 day or 90 day supply? [place]

## 2017-07-23 ENCOUNTER — Other Ambulatory Visit: Payer: Self-pay | Admitting: Cardiology

## 2017-07-25 ENCOUNTER — Ambulatory Visit: Payer: Self-pay | Admitting: Cardiology

## 2017-09-12 ENCOUNTER — Other Ambulatory Visit: Payer: Self-pay | Admitting: Cardiology

## 2017-09-12 NOTE — Telephone Encounter (Signed)
REFILL 

## 2017-10-29 ENCOUNTER — Telehealth: Payer: Self-pay | Admitting: Cardiology

## 2017-10-29 MED ORDER — PRAZOSIN HCL 2 MG PO CAPS: 2.0000 mg | ORAL_CAPSULE | Freq: Every morning | ORAL | 0 refills | Status: DC

## 2017-10-29 NOTE — Telephone Encounter (Signed)
New Message    *STAT* If patient is at the pharmacy, call can be transferred to refill team.   1. Which medications need to be refilled? (please list name of each medication and dose if known) prazosin (MINIPRESS) 2 MG capsule  2. Which pharmacy/location (including street and city if local pharmacy) is medication to be sent to? [place], [place]  3. Do they need a 30 day or 90 day supply? [place]

## 2017-11-08 ENCOUNTER — Other Ambulatory Visit: Payer: Self-pay | Admitting: Cardiology

## 2017-11-08 NOTE — Telephone Encounter (Signed)
°*  STAT* If patient is at the pharmacy, call can be transferred to refill team.   1. Which medications need to be refilled? (please list name of each medication and dose if known) Prazosin -last week it was called to the wrong pharmacy  2. Which pharmacy/location (including street and city if local pharmacy) is medication to be sent to? [facility] and call to [facility] [phone] need 10 until her Mail Order comes  3. Do they need a 30 day or 90 day supply?90 and refill

## 2017-11-11 MED ORDER — FUROSEMIDE 40 MG PO TABS: 40.0000 mg | ORAL_TABLET | Freq: Every day | ORAL | 0 refills | Status: DC

## 2017-11-11 MED ORDER — PRAZOSIN HCL 2 MG PO CAPS: 2.0000 mg | ORAL_CAPSULE | Freq: Every morning | ORAL | 0 refills | Status: DC

## 2017-11-11 NOTE — Telephone Encounter (Signed)
RETURNED CALL TO PT SHE STATES THAT HAS NO TRANSPORTATION TO BRING HER TO A OV SO SHE IS UNABLE TO SCHEDULE AT THIS TIME, SHE WILL CB AND SCHEDULE APPT IN THE FUTURE

## 2017-11-13 ENCOUNTER — Telehealth: Payer: Self-pay | Admitting: Cardiology

## 2017-11-13 NOTE — Telephone Encounter (Signed)
Script cancelled at [facility] at pt's request ./cy

## 2017-11-13 NOTE — Telephone Encounter (Signed)
New Message:    Pt says she needs somebody to call and cancel her prescription for Prazosin at [facility]. This is so she will still be able to get her Mail Order refill from [facility]. If any questions please call.a

## 2017-11-18 ENCOUNTER — Other Ambulatory Visit: Payer: Self-pay | Admitting: Cardiology

## 2017-11-19 NOTE — Telephone Encounter (Signed)
Needs an appointment refilled x1

## 2017-12-08 ENCOUNTER — Other Ambulatory Visit: Payer: Self-pay | Admitting: Cardiology

## 2017-12-09 ENCOUNTER — Telehealth: Payer: Self-pay | Admitting: Cardiology

## 2017-12-09 NOTE — Telephone Encounter (Signed)
New Message   Pt c/o medication issue:  1. Name of Medication: irbesartan (AVAPRO) 150 MG tablet 2. How are you currently taking this medication (dosage and times per day)?   3. Are you having a reaction (difficulty breathing--STAT)?   4. What is your medication issue? Patient is calling because the pharmacy does not have this medication. She is wondering if a different medication can be sent. Please call to discuss.

## 2017-12-09 NOTE — Telephone Encounter (Signed)
May replace with valsartan 160mg  daily

## 2017-12-10 MED ORDER — VALSARTAN 160 MG PO TABS: 160.0000 mg | ORAL_TABLET | Freq: Every day | ORAL | 3 refills | Status: DC

## 2017-12-10 NOTE — Telephone Encounter (Signed)
Spoke with pt and advised that per Pharm D, we can changed Irbesartan 150 mg to Valsartan 160 mg. Pt verbalized understanding. New order sent to pharmacy.

## 2018-01-13 ENCOUNTER — Other Ambulatory Visit: Payer: Self-pay | Admitting: Cardiology

## 2018-01-14 NOTE — Telephone Encounter (Signed)
Rx sent to pharmacy   

## 2018-01-19 ENCOUNTER — Other Ambulatory Visit: Payer: Self-pay | Admitting: Cardiology

## 2018-01-22 ENCOUNTER — Other Ambulatory Visit: Payer: Self-pay

## 2018-01-22 MED ORDER — CARVEDILOL 25 MG PO TABS: 25.0000 mg | ORAL_TABLET | Freq: Two times a day (BID) | ORAL | 3 refills | Status: DC

## 2018-02-26 ENCOUNTER — Other Ambulatory Visit: Payer: Self-pay | Admitting: Cardiology

## 2018-03-07 ENCOUNTER — Other Ambulatory Visit: Payer: Self-pay | Admitting: Cardiology

## 2018-03-10 ENCOUNTER — Other Ambulatory Visit: Payer: Self-pay | Admitting: Cardiology

## 2018-03-10 ENCOUNTER — Other Ambulatory Visit: Payer: Self-pay | Admitting: *Deleted

## 2018-03-11 NOTE — Telephone Encounter (Signed)
Rx request sent to pharmacy.  

## 2018-03-12 MED ORDER — CARVEDILOL 25 MG PO TABS: 25.0000 mg | ORAL_TABLET | Freq: Two times a day (BID) | ORAL | 1 refills | Status: DC

## 2018-03-12 MED ORDER — HYDROCHLOROTHIAZIDE 12.5 MG PO CAPS: ORAL_CAPSULE | ORAL | 1 refills | Status: DC

## 2018-03-12 MED ORDER — TRAZODONE HCL 100 MG PO TABS: 100.0000 mg | ORAL_TABLET | Freq: Every day | ORAL | 1 refills | Status: DC

## 2018-03-12 MED ORDER — VALSARTAN 160 MG PO TABS: 160.0000 mg | ORAL_TABLET | Freq: Every day | ORAL | 1 refills | Status: DC

## 2018-03-18 ENCOUNTER — Other Ambulatory Visit: Payer: Self-pay

## 2018-04-06 DIAGNOSIS — K625 Hemorrhage of anus and rectum: Secondary | ICD-10-CM | POA: Diagnosis not present

## 2018-04-06 DIAGNOSIS — K644 Residual hemorrhoidal skin tags: Secondary | ICD-10-CM | POA: Diagnosis not present

## 2018-04-06 DIAGNOSIS — R110 Nausea: Secondary | ICD-10-CM | POA: Diagnosis not present

## 2018-04-07 ENCOUNTER — Other Ambulatory Visit: Payer: Self-pay | Admitting: Cardiology

## 2018-04-18 ENCOUNTER — Other Ambulatory Visit: Payer: Self-pay | Admitting: Cardiology

## 2018-04-23 NOTE — Telephone Encounter (Signed)
REFILLED  X 2 PER DR HARDING ,  PATIENT WILL NEED TO HAVE PRIMARY TO FILL THE NEXT REFILL OF MEDICATION  PATIENT AWARE

## 2018-04-30 ENCOUNTER — Other Ambulatory Visit: Payer: Self-pay | Admitting: Cardiology

## 2018-05-05 ENCOUNTER — Other Ambulatory Visit: Payer: Self-pay | Admitting: Cardiology

## 2018-05-15 ENCOUNTER — Other Ambulatory Visit: Payer: Self-pay | Admitting: Cardiology

## 2018-05-25 IMAGING — MR MR CARD MORPHOLOGY WO/W CM
8 of 9 series · 15 of 16 positions shown · IV contrast (multihance)
Comparison: none

CLINICAL DATA: Hypertrophic Cardiomyopathy

EXAM:
CARDIAC MRI
TECHNIQUE: The patient was scanned on a 1.5 Tesla GE magnet. A dedicated
cardiac coil was used. Functional imaging was done using Fiesta
sequences. [DATE], and 4 chamber views were done to assess for RWMA's.
Modified Akans rule using a short axis stack was used to
calculate an ejection fraction on a dedicated work station using
Circle software. The patient received 30 cc of Multihance. After 10
minutes inversion recovery sequences were used to assess for
infiltration and scar tissue.
CONTRAST:  30 cc Multihance

[Series 3: bSSFP · sagittal · 8.0mm · 1.25mm/px · 1 of 14 slices shown (1 of 4)]
[im 1/14]
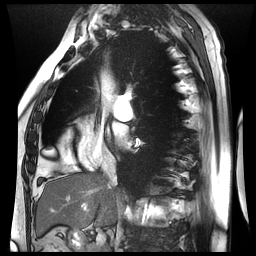

[Series 4: bSSFP · axial · 8.0mm · 1.29mm/px · 1 of 20 slices shown (2 of 4)]
[im 1/20]
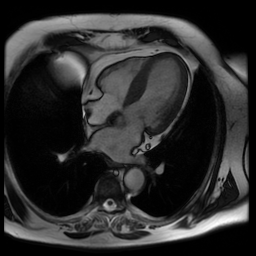

[Series 5: bSSFP · oblique · 8.0mm · 1.37mm/px · 8 of 360 slices shown (3 of 4)]
[im 1/360]
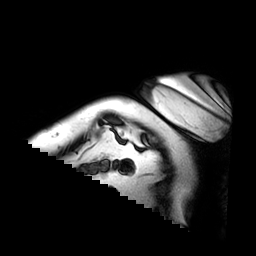
[im 52/360]
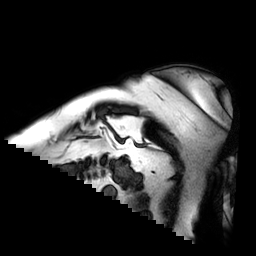
[im 103/360]
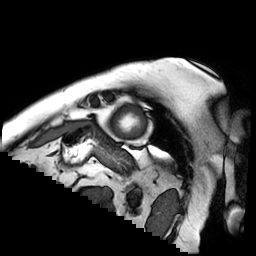
[im 154/360]
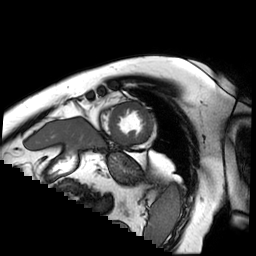
[im 206/360]
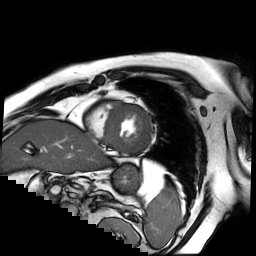
[im 257/360]
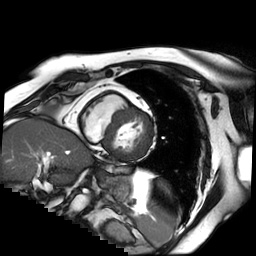
[im 308/360]
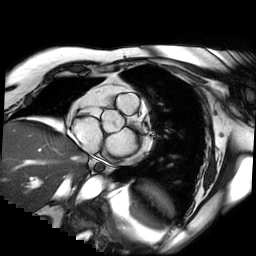
[im 360/360]
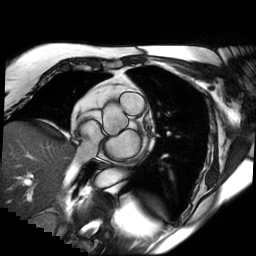

[Series 6: bSSFP · oblique · 8.0mm · 1.21mm/px · 1 of 60 slices shown (4 of 4)]
[im 1/60]
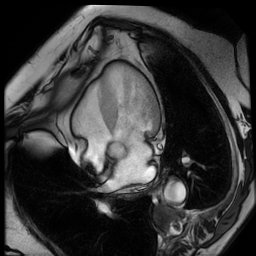

[Series 7: cine ir · oblique · 8.0mm · 1.37mm/px · 1 of 30 slices shown]
[im 1/30]
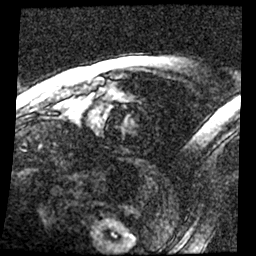

[Series 10: delayed ir prep · oblique · 8.0mm · 1.37mm/px · 1 of 10 slices shown (1 of 2)]
[im 1/10]
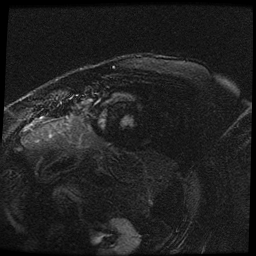

[Series 11: delayed ir prep · oblique · 8.0mm · 1.37mm/px · 1 of 11 slices shown (2 of 2)]
[im 1/11]
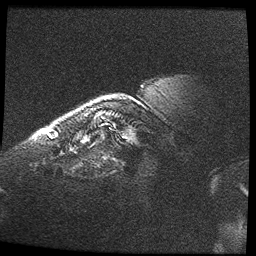

[Series 12: rad delayed ir · oblique · 8.0mm · 1.29mm/px · 1 of 3 slices shown]
[im 1/3]
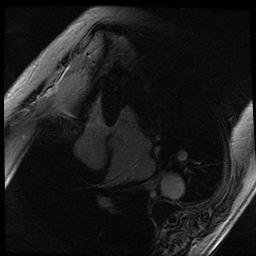

[15 of 16 positions shown; findings below may reference images not displayed]

FINDINGS: There was moderate LAE. The RA/RV were normal in size and function
There was no ASD/PFO or pericardial effusion. The aortic root was
normal at 3.3 cm. The aortic valve was tri leaflet with thickening
of the leaflet tips and partial fusion of the right and left cusps.
The LV had severe asymmetrical septal hypertrophy at 19 mm compared
to the posterior wall at 10 mm and lateral wall at 8 mm. There was
transient Annamma Vandiver at the chordal level with turbulence in the LVOT.
There was moderate appearing MR. Delayed gadolinium sequences showed
uptake in the mid septum and also mid anterior wall consistent with
some myofibrillar disarray The quantitative EF was 72% (EDV 121 cc
ESV 34 cc SV 87 cc)
IMPRESSION: 1) Findings consistent with hypertrophic cardiomyopathy. Septal
thickness 19 mm

2) Positive gadolinium uptake in mid septum and mid anterior wall
but mild

3) Moderate appearing MR

4) Moderate LAE

5) Normal EF 72%

6) Thickened tri leaflet AV with partial fusion of right and left
cusps

7) Turbulence in the LVOT with transient more chordal YERKEZHAN

Hendrikus Ande

## 2018-06-05 ENCOUNTER — Other Ambulatory Visit: Payer: Self-pay | Admitting: Cardiology

## 2018-06-05 NOTE — Telephone Encounter (Signed)
Rx request sent to pharmacy.  

## 2018-06-12 ENCOUNTER — Other Ambulatory Visit: Payer: Self-pay | Admitting: Cardiology

## 2018-06-18 ENCOUNTER — Other Ambulatory Visit: Payer: Self-pay | Admitting: *Deleted

## 2018-06-18 MED ORDER — CARVEDILOL 25 MG PO TABS: 25.0000 mg | ORAL_TABLET | Freq: Two times a day (BID) | ORAL | 0 refills | Status: DC

## 2018-06-20 ENCOUNTER — Other Ambulatory Visit: Payer: Self-pay

## 2018-06-20 MED ORDER — VALSARTAN 160 MG PO TABS: 160.0000 mg | ORAL_TABLET | Freq: Every day | ORAL | 0 refills | Status: DC

## 2018-06-20 MED ORDER — PANTOPRAZOLE SODIUM 40 MG PO TBEC: 40.0000 mg | DELAYED_RELEASE_TABLET | Freq: Every day | ORAL | 0 refills | Status: DC

## 2018-06-20 MED ORDER — FENOFIBRATE MICRONIZED 134 MG PO CAPS: 134.0000 mg | ORAL_CAPSULE | Freq: Every day | ORAL | 0 refills | Status: DC

## 2018-06-20 MED ORDER — HYDROCHLOROTHIAZIDE 12.5 MG PO CAPS: 12.5000 mg | ORAL_CAPSULE | Freq: Every day | ORAL | 0 refills | Status: DC

## 2018-06-20 MED ORDER — PRAZOSIN HCL 2 MG PO CAPS: 2.0000 mg | ORAL_CAPSULE | Freq: Every day | ORAL | 0 refills | Status: DC

## 2018-06-20 NOTE — Telephone Encounter (Signed)
Rx(s) sent to pharmacy electronically.  

## 2018-06-23 ENCOUNTER — Telehealth: Payer: Self-pay | Admitting: Cardiology

## 2018-06-23 NOTE — Telephone Encounter (Signed)
Call and spoke with pt lett her know coreg was send into her [place] pharmacy 01/29, also advised pt she will need appt before she can received any more refills, pt have not been seen since 2018

## 2018-06-23 NOTE — Telephone Encounter (Signed)
New messge    *STAT* If patient is at the pharmacy, call can be transferred to refill team.   1. Which medications need to be refilled? (please list name of each medication and dose if known) carvedilol (COREG) 25 MG tablet   2. Which pharmacy/location (including street and city if local pharmacy) is medication to be sent to?[facility]  3. Do they need a 30 day or 90 day supply? 48    Pt stated that she now has [facility] and is using a new mail order pharmacy. She stated that she is out of medication. Pt also wants to know if medications have been approved for mail order

## 2018-07-07 ENCOUNTER — Other Ambulatory Visit: Payer: Self-pay

## 2018-07-07 MED ORDER — FUROSEMIDE 40 MG PO TABS: 40.0000 mg | ORAL_TABLET | Freq: Every day | ORAL | 0 refills | Status: DC

## 2018-07-07 NOTE — Telephone Encounter (Signed)
Rx(s) sent to pharmacy electronically.  

## 2018-07-19 ENCOUNTER — Other Ambulatory Visit: Payer: Self-pay | Admitting: Cardiology

## 2018-10-02 ENCOUNTER — Other Ambulatory Visit: Payer: Self-pay | Admitting: Cardiology

## 2018-10-03 ENCOUNTER — Other Ambulatory Visit: Payer: Self-pay

## 2018-10-03 MED ORDER — HYDROCHLOROTHIAZIDE 12.5 MG PO CAPS: 12.5000 mg | ORAL_CAPSULE | Freq: Every day | ORAL | 0 refills | Status: DC

## 2018-10-06 ENCOUNTER — Telehealth: Payer: Self-pay | Admitting: Cardiology

## 2018-10-06 NOTE — Telephone Encounter (Signed)
Patient also has hydrochlorothiazide (MICROZIDE) 12.5 MG capsule that Dr. Ellyn Hack prescribed, but she also has another script from another doctor for the same medication for 25 mg.  They want to know which one is correct.

## 2018-10-07 NOTE — Telephone Encounter (Signed)
This has to be answered by the MD

## 2018-10-07 NOTE — Telephone Encounter (Signed)
Spoke with pharmacy and notified them that patient last seen 04/2017. Dr. Cherylann Ratel (sp?) Rx'ed hctz 25mg  in March, which pharmacy shipped out to patient already

## 2019-01-26 ENCOUNTER — Inpatient Hospital Stay: Payer: Medicare Other

## 2019-01-26 ENCOUNTER — Inpatient Hospital Stay
Admission: EM | Admit: 2019-01-26 | Discharge: 2019-01-28 | DRG: 683 | Disposition: A | Discharge status: Home Health | Payer: Medicare Other | Attending: Internal Medicine | Admitting: Internal Medicine

## 2019-01-26 ENCOUNTER — Other Ambulatory Visit: Payer: Self-pay

## 2019-01-26 ENCOUNTER — Encounter: Payer: Self-pay | Admitting: *Deleted

## 2019-01-26 ENCOUNTER — Emergency Department: Payer: Medicare Other

## 2019-01-26 DIAGNOSIS — F17210 Nicotine dependence, cigarettes, uncomplicated: Secondary | ICD-10-CM | POA: Diagnosis present

## 2019-01-26 DIAGNOSIS — E7800 Pure hypercholesterolemia, unspecified: Secondary | ICD-10-CM | POA: Diagnosis present

## 2019-01-26 DIAGNOSIS — Z79899 Other long term (current) drug therapy: Secondary | ICD-10-CM

## 2019-01-26 DIAGNOSIS — R0902 Hypoxemia: Secondary | ICD-10-CM | POA: Diagnosis present

## 2019-01-26 DIAGNOSIS — Z7982 Long term (current) use of aspirin: Secondary | ICD-10-CM

## 2019-01-26 DIAGNOSIS — I959 Hypotension, unspecified: Secondary | ICD-10-CM | POA: Diagnosis present

## 2019-01-26 DIAGNOSIS — J449 Chronic obstructive pulmonary disease, unspecified: Secondary | ICD-10-CM | POA: Diagnosis present

## 2019-01-26 DIAGNOSIS — K219 Gastro-esophageal reflux disease without esophagitis: Secondary | ICD-10-CM | POA: Diagnosis present

## 2019-01-26 DIAGNOSIS — E785 Hyperlipidemia, unspecified: Secondary | ICD-10-CM | POA: Diagnosis present

## 2019-01-26 DIAGNOSIS — D72829 Elevated white blood cell count, unspecified: Secondary | ICD-10-CM | POA: Diagnosis present

## 2019-01-26 DIAGNOSIS — N179 Acute kidney failure, unspecified: Secondary | ICD-10-CM | POA: Diagnosis present

## 2019-01-26 DIAGNOSIS — R531 Weakness: Secondary | ICD-10-CM

## 2019-01-26 DIAGNOSIS — Z20828 Contact with and (suspected) exposure to other viral communicable diseases: Secondary | ICD-10-CM | POA: Diagnosis present

## 2019-01-26 DIAGNOSIS — I421 Obstructive hypertrophic cardiomyopathy: Secondary | ICD-10-CM | POA: Diagnosis present

## 2019-01-26 DIAGNOSIS — F411 Generalized anxiety disorder: Secondary | ICD-10-CM | POA: Diagnosis present

## 2019-01-26 DIAGNOSIS — I10 Essential (primary) hypertension: Secondary | ICD-10-CM | POA: Diagnosis present

## 2019-01-26 DIAGNOSIS — E860 Dehydration: Secondary | ICD-10-CM | POA: Diagnosis present

## 2019-01-26 DIAGNOSIS — F4001 Agoraphobia with panic disorder: Secondary | ICD-10-CM | POA: Diagnosis present

## 2019-01-26 DIAGNOSIS — N289 Disorder of kidney and ureter, unspecified: Secondary | ICD-10-CM

## 2019-01-26 DIAGNOSIS — K5900 Constipation, unspecified: Secondary | ICD-10-CM | POA: Diagnosis present

## 2019-01-26 LAB — LACTIC ACID, PLASMA: Lactic Acid, Venous: 0.9 mmol/L (ref 0.5–1.9)

## 2019-01-26 LAB — COMPREHENSIVE METABOLIC PANEL
ALT: 15 U/L (ref 0–44)
AST: 21 U/L (ref 15–41)
Albumin: 4.4 g/dL (ref 3.5–5.0)
Alkaline Phosphatase: 38 U/L (ref 38–126)
Anion gap: 13 (ref 5–15)
BUN: 53 mg/dL — ABNORMAL HIGH (ref 8–23)
CO2: 30 mmol/L (ref 22–32)
Calcium: 9.2 mg/dL (ref 8.9–10.3)
Chloride: 92 mmol/L — ABNORMAL LOW (ref 98–111)
Creatinine, Ser: 1.53 mg/dL — ABNORMAL HIGH (ref 0.44–1.00)
GFR calc Af Amer: 40 mL/min — ABNORMAL LOW (ref >60)
GFR calc non Af Amer: 34 mL/min — ABNORMAL LOW (ref >60)
Glucose, Bld: 116 mg/dL — ABNORMAL HIGH (ref 70–99)
Potassium: 3.2 mmol/L — ABNORMAL LOW (ref 3.5–5.1)
Sodium: 135 mmol/L (ref 135–145)
Total Bilirubin: 1.1 mg/dL (ref 0.3–1.2)
Total Protein: 7.1 g/dL (ref 6.5–8.1)

## 2019-01-26 LAB — URINALYSIS, COMPLETE (UACMP) WITH MICROSCOPIC
Bilirubin Urine: NEGATIVE
Glucose, UA: NEGATIVE mg/dL
Ketones, ur: NEGATIVE mg/dL
Nitrite: NEGATIVE
Protein, ur: NEGATIVE mg/dL
Specific Gravity, Urine: 1.008 (ref 1.005–1.030)
pH: 6 (ref 5.0–8.0)

## 2019-01-26 LAB — BLOOD GAS, VENOUS
Acid-Base Excess: 12.1 mmol/L — ABNORMAL HIGH (ref 0.0–2.0)
Bicarbonate: 37.8 mmol/L — ABNORMAL HIGH (ref 20.0–28.0)
O2 Saturation: 91.8 %
Patient temperature: 37
pCO2, Ven: 52 mmHg (ref 44.0–60.0)
pH, Ven: 7.47 — ABNORMAL HIGH (ref 7.250–7.430)
pO2, Ven: 59 mmHg — ABNORMAL HIGH (ref 32.0–45.0)

## 2019-01-26 LAB — ETHANOL: Alcohol, Ethyl (B): <10 mg/dL (ref <10)

## 2019-01-26 LAB — FIBRIN DERIVATIVES D-DIMER (ARMC ONLY): Fibrin derivatives D-dimer (ARMC): 380.86 ng/mL (FEU) (ref 0.00–499.00)

## 2019-01-26 LAB — CBC
HCT: 38.3 % (ref 36.0–46.0)
Hemoglobin: 13.4 g/dL (ref 12.0–15.0)
MCH: 31.6 pg (ref 26.0–34.0)
MCHC: 35 g/dL (ref 30.0–36.0)
MCV: 90.3 fL (ref 80.0–100.0)
Platelets: 216 10*3/uL (ref 150–400)
RBC: 4.24 MIL/uL (ref 3.87–5.11)
RDW: 12.4 % (ref 11.5–15.5)
WBC: 13.5 10*3/uL — ABNORMAL HIGH (ref 4.0–10.5)
nRBC: 0 % (ref 0.0–0.2)

## 2019-01-26 LAB — SARS CORONAVIRUS 2 BY RT PCR (HOSPITAL ORDER, PERFORMED IN ~~LOC~~ HOSPITAL LAB): SARS Coronavirus 2: NEGATIVE

## 2019-01-26 LAB — TROPONIN I (HIGH SENSITIVITY)
Troponin I (High Sensitivity): 21 ng/L — ABNORMAL HIGH (ref <18)
Troponin I (High Sensitivity): 18 ng/L — ABNORMAL HIGH (ref <18)

## 2019-01-26 LAB — BRAIN NATRIURETIC PEPTIDE: B Natriuretic Peptide: 278 pg/mL — ABNORMAL HIGH (ref 0.0–100.0)

## 2019-01-26 LAB — MAGNESIUM: Magnesium: 2.4 mg/dL (ref 1.7–2.4)

## 2019-01-26 MED ORDER — ENOXAPARIN SODIUM 40 MG/0.4ML ~~LOC~~ SOLN
40.0000 mg | SUBCUTANEOUS | Status: DC
  Administered 2019-01-26 – 2019-01-27 (×2): 40 mg via SUBCUTANEOUS
  Filled 2019-01-26 (×2): qty 0.4

## 2019-01-26 MED ORDER — FENOFIBRATE 160 MG PO TABS
160.0000 mg | ORAL_TABLET | Freq: Every day | ORAL | Status: DC
  Administered 2019-01-26 – 2019-01-28 (×3): 160 mg via ORAL
  Filled 2019-01-26 (×3): qty 1

## 2019-01-26 MED ORDER — SODIUM CHLORIDE 0.9 % IV BOLUS
1000.0000 mL | Freq: Once | INTRAVENOUS | Status: AC
  Administered 2019-01-26: 1000 mL via INTRAVENOUS

## 2019-01-26 MED ORDER — POTASSIUM CHLORIDE CRYS ER 20 MEQ PO TBCR
20.0000 meq | EXTENDED_RELEASE_TABLET | Freq: Once | ORAL | Status: AC
  Administered 2019-01-26: 12:00:00 20 meq via ORAL
  Filled 2019-01-26: qty 1

## 2019-01-26 MED ORDER — BUSPIRONE HCL 5 MG PO TABS
15.0000 mg | ORAL_TABLET | Freq: Two times a day (BID) | ORAL | Status: DC
  Administered 2019-01-26 – 2019-01-28 (×4): 15 mg via ORAL
  Filled 2019-01-26 (×6): qty 3

## 2019-01-26 MED ORDER — CELECOXIB 200 MG PO CAPS
200.0000 mg | ORAL_CAPSULE | Freq: Every day | ORAL | Status: DC
  Administered 2019-01-26 – 2019-01-28 (×3): 200 mg via ORAL
  Filled 2019-01-26 (×3): qty 1

## 2019-01-26 MED ORDER — ADULT MULTIVITAMIN W/MINERALS CH: 1.0000 | ORAL_TABLET | Freq: Every day | ORAL | Status: DC

## 2019-01-26 MED ORDER — SODIUM CHLORIDE 0.9 % IV SOLN
INTRAVENOUS | Status: DC
  Administered 2019-01-26: 12:00:00 via INTRAVENOUS

## 2019-01-26 MED ORDER — VITAMIN B-1 100 MG PO TABS
100.0000 mg | ORAL_TABLET | Freq: Every day | ORAL | Status: DC
  Administered 2019-01-26 – 2019-01-28 (×3): 100 mg via ORAL
  Filled 2019-01-26 (×3): qty 1

## 2019-01-26 MED ORDER — ALBUTEROL SULFATE HFA 108 (90 BASE) MCG/ACT IN AERS: 2.0000 | INHALATION_SPRAY | Freq: Four times a day (QID) | RESPIRATORY_TRACT | Status: DC | PRN

## 2019-01-26 MED ORDER — ESCITALOPRAM OXALATE 10 MG PO TABS
10.0000 mg | ORAL_TABLET | Freq: Every day | ORAL | Status: DC
  Administered 2019-01-26 – 2019-01-28 (×2): 10 mg via ORAL
  Filled 2019-01-26 (×4): qty 1

## 2019-01-26 MED ORDER — TRAZODONE HCL 50 MG PO TABS
100.0000 mg | ORAL_TABLET | Freq: Every day | ORAL | Status: DC
  Administered 2019-01-27: 21:00:00 100 mg via ORAL
  Filled 2019-01-26: qty 2

## 2019-01-26 MED ORDER — ASPIRIN EC 81 MG PO TBEC
81.0000 mg | DELAYED_RELEASE_TABLET | Freq: Every day | ORAL | Status: DC
  Administered 2019-01-26 – 2019-01-28 (×3): 81 mg via ORAL
  Filled 2019-01-26 (×3): qty 1

## 2019-01-26 MED ORDER — ALBUTEROL SULFATE (2.5 MG/3ML) 0.083% IN NEBU: 2.5000 mg | INHALATION_SOLUTION | Freq: Four times a day (QID) | RESPIRATORY_TRACT | Status: DC | PRN

## 2019-01-26 MED ORDER — FOLIC ACID 1 MG PO TABS
1.0000 mg | ORAL_TABLET | Freq: Every day | ORAL | Status: DC
  Administered 2019-01-26 – 2019-01-28 (×3): 1 mg via ORAL
  Filled 2019-01-26 (×3): qty 1

## 2019-01-26 MED ORDER — ADULT MULTIVITAMIN W/MINERALS CH
1.0000 | ORAL_TABLET | Freq: Every day | ORAL | Status: DC
  Administered 2019-01-26 – 2019-01-28 (×3): 1 via ORAL
  Filled 2019-01-26 (×3): qty 1

## 2019-01-26 MED ORDER — TIZANIDINE HCL 2 MG PO TABS
4.0000 mg | ORAL_TABLET | Freq: Three times a day (TID) | ORAL | Status: DC
  Administered 2019-01-26 – 2019-01-28 (×5): 4 mg via ORAL
  Filled 2019-01-26 (×9): qty 2

## 2019-01-26 MED ORDER — PANTOPRAZOLE SODIUM 40 MG PO TBEC
40.0000 mg | DELAYED_RELEASE_TABLET | Freq: Every day | ORAL | Status: DC
  Administered 2019-01-26 – 2019-01-28 (×3): 40 mg via ORAL
  Filled 2019-01-26 (×3): qty 1

## 2019-01-26 NOTE — ED Triage Notes (Signed)
Weakness, nausea and not feeling well for 3 days. Pt denies vomiting, diarrhea or fevers. No O2 at baseline. Denies SHOB. Hypoxia noted, will come up when encouraged to breathe and wake but drowsy.

## 2019-01-26 NOTE — Progress Notes (Signed)
Estill Bamberg (patients niece) would like to be called by MD with update. Contact number: [phone]  Madlyn Frankel, RN

## 2019-01-26 NOTE — H&P (Signed)
[facility] [place] [facility] [place] NAME: Sarah Barnes    MR#:  [id number]  DATE OF BIRTH:  1950/06/29  DATE OF ADMISSION:  01/26/2019  PRIMARY CARE PHYSICIAN: Patient, No Pcp Per   REQUESTING/REFERRING PHYSICIAN: Harvest Dark, MD  CHIEF COMPLAINT:   Chief Complaint  Patient presents with   Weakness    HISTORY OF PRESENT ILLNESS:   68 year old female with past medical history of COPD, tobacco abuse, ETOH abuse, GAD, HTN, hypertrophic obstructive cardiomyopathy and recurrent pneumonia presenting to the ED with c/o generalized weakness, lightheadedness and nausea.  Patient reports onset of symptoms since Saturday with progression worsening this morning.  She stated her sister noted that she was not looking right and not responding during the conversation.  She was advised to go to the ED however given that she has lived for a while did not want to come in for evaluation. She came to visit her nephew this morning and noticed worsening lightheadedness, showing to the side, and nausea without vomiting.  Denies associated symptoms of chest pain, shortness of breath, fevers or chills, recent sick contacts, diarrhea, abdominal pain or any other associated symptoms. Patient report he quit smoking about two weeks ago and thinks this may be causing his symptoms.  On arrival to the ED, she was afebrile with blood pressure 88/47 mm Hg and pulse rate 65 beats/min. There were no focal neurological deficits; she was alert and oriented x4, and he did not demonstrate any memory deficits. Patient was noted to be hypoxic in the 80's on room air so was placed on oxygen via nasal cannula.  Initial labs revealed WBC 13.5, potassium 3.2, BUN 53, creatinine 1.53, troponin 21, repeat 18.  COVID negative, urinalysis shows small leuks and rare bacteria.  Chest x-ray did not show any active cardiopulmonary process.  Hospitalist contacted to admit for further evaluation and  management of hypoxia and hypotension.  PAST MEDICAL HISTORY:   Past Medical History:  Diagnosis Date   Agoraphobia with panic disorder    Anemia    Arthritis    "all over" (08/13/2016)   Chronic bronchitis (HCC)    COPD (chronic obstructive pulmonary disease) (HCC)    Depression    Dyspnea    occ   Dysrhythmia    palpitations occ due to anxiety   ETOH abuse    GAD (generalized anxiety disorder)    GERD (gastroesophageal reflux disease)    Headache    "daily til I got glasses; now have headache once in awhile" (08/13/2016)   Hepatitis 1960   "isolated &  hospitalized for 2 weeks; don't know which kind of hepatitis"    Hernia, hiatal    High cholesterol    Hypertension    Hypertrophic obstructive cardiomyopathy ([place])    Mild aortic stenosis    Pneumonia    "once or twice" (08/13/2016)   PONV (postoperative nausea and vomiting)    Sciatica     PAST SURGICAL HISTORY:   Past Surgical History:  Procedure Laterality Date   ANAL FISTULECTOMY  1970s X 3   BACK SURGERY     HERNIA REPAIR     KNEE ARTHROSCOPY Right    LAPAROSCOPIC CHOLECYSTECTOMY     LAPAROSCOPIC INCISIONAL / UMBILICAL / VENTRAL HERNIA REPAIR  08/13/2016   VHR w/mesh   LUMBAR DISC SURGERY     "herniated disc"   NASAL FRACTURE SURGERY  1970s X 2   RIGHT/LEFT HEART CATH AND CORONARY ANGIOGRAPHY N/A  12/27/2016   Procedure: RIGHT/LEFT HEART CATH AND CORONARY ANGIOGRAPHY;  Surgeon: Leonie Man, MD;  Location: [facility] INVASIVE CV LAB: Mild-moderate pulmonary hypertension.  Hyperdynamic ventricle noted.  EF 55-65% -hyperdynamic (unable to measure LVOT gradient).  Mildly elevated very tortuous but angiographically normal coronary arteries, suggesting hypertensive heart disease   SHOULDER ARTHROSCOPY WITH ROTATOR CUFF REPAIR Right 1990s?   TONSILLECTOMY  1951   TRANSTHORACIC ECHOCARDIOGRAM  12/2016    EF 60-65% with dynamic outflow tract obstruction at rest. Peak gradient 52 mmHg  consistent with HOCM.  GRII DD area. Aortic sclerosis but no stenosis. Systolic anterior motion of mitral valve chordae. Mild mitral stenosis. Moderate LA dilation and mild RA dilation. Peak PA pressures 35 mmHg.   VENTRAL HERNIA REPAIR N/A 08/13/2016   Procedure: LAPAROSCOPIC VENTRAL HERNIA REPAIR WITH MESH;  Surgeon: Judeth Horn, MD;  Location: [place];  Service: General;  Laterality: N/A;    SOCIAL HISTORY:   Social History   Tobacco Use   Smoking status: Current Every Day Smoker    Packs/day: 1.50    Years: 49.00    Pack years: 73.50    Types: Cigarettes   Smokeless tobacco: Never Used  Substance Use Topics   Alcohol use: Yes    Alcohol/week: 84.0 standard drinks    Types: 36 Cans of beer, 48 Shots of liquor per week    Comment: 08/13/2016 "couple beers-1 quart vodka daily; depends on my mood; 3, 12 packs of beer/week 1 1/2 quarts vodka/week""    FAMILY HISTORY:   Family History  Problem Relation Age of Onset   Diabetes Mother    Hyperlipidemia Mother    Hypertension Mother    Heart attack Mother 37   Angina Mother        chronic problem   Hypertension Father    Stroke Father    Cancer Brother     DRUG ALLERGIES:  No Known Allergies  REVIEW OF SYSTEMS:   Review of Systems  Constitutional: Positive for malaise/fatigue. Negative for chills, fever and weight loss.  HENT: Negative for congestion, hearing loss and sore throat.   Eyes: Negative for blurred vision and double vision.  Respiratory: Negative for cough, shortness of breath and wheezing.   Cardiovascular: Negative for chest pain, palpitations, orthopnea and leg swelling.  Gastrointestinal: Positive for nausea. Negative for abdominal pain, diarrhea and vomiting.  Genitourinary: Negative for dysuria and urgency.  Musculoskeletal: Negative for myalgias.  Skin: Negative for rash.  Neurological: Positive for dizziness and weakness. Negative for sensory change, speech change, focal weakness and  headaches.  Psychiatric/Behavioral: Negative for depression. The patient is nervous/anxious.     MEDICATIONS AT HOME:   Prior to Admission medications   Medication Sig Start Date End Date Taking? Authorizing Provider  aspirin EC 81 MG EC tablet Take 1 tablet (81 mg total) by mouth daily. 12/29/16  Yes Barton Dubois, MD  busPIRone (BUSPAR) 15 MG tablet Take 1 tablet (15 mg total) by mouth 2 (two) times daily. 10/11/16  Yes Saguier, Percell Miller, PA-C  carvedilol (COREG) 25 MG tablet Take 1 tablet (25 mg total) by mouth 2 (two) times daily. NEED OV. 06/18/18  Yes Leonie Man, MD  escitalopram (LEXAPRO) 10 MG tablet Take 10 mg by mouth daily. 11/03/18  Yes [provider]  fenofibrate micronized (LOFIBRA) 134 MG capsule Take 1 capsule (134 mg total) by mouth daily before breakfast. NEEDS APPOINTMENT FOR FUTURE REFILLS 06/20/18  Yes Leonie Man, MD  furosemide (LASIX) 40 MG tablet Take  1 tablet (40 mg total) by mouth daily. NEEDS APPOINTMENT FOR FUTURE REFILLS 07/07/18  Yes Leonie Man, MD  hydrochlorothiazide (HYDRODIURIL) 25 MG tablet Take 25 mg by mouth daily. 12/26/18  Yes [provider]  Multiple Vitamin (MULTIVITAMIN WITH MINERALS) TABS tablet Take 1 tablet by mouth daily. 12/29/16  Yes Barton Dubois, MD  pantoprazole (PROTONIX) 40 MG tablet Take 1 tablet (40 mg total) by mouth daily. NEEDS APPOINTMENT FOR FUTURE REFILLS 06/20/18  Yes Leonie Man, MD  prazosin (MINIPRESS) 2 MG capsule Take 1 capsule (2 mg total) by mouth daily. NEEDS APPOINTMENT FOR FUTURE REFILLS 06/20/18  Yes Leonie Man, MD  traZODone (DESYREL) 100 MG tablet TAKE 1 TABLET BY MOUTH EVERYDAY AT BEDTIME 04/23/18  Yes Leonie Man, MD  valsartan (DIOVAN) 160 MG tablet Take 1 tablet (160 mg total) by mouth daily. NEEDS APPOINTMENT FOR FUTURE REFILLS 06/20/18  Yes Leonie Man, MD  albuterol (VENTOLIN HFA) 108 (90 Base) MCG/ACT inhaler Inhale 2 puffs into the lungs every 6 (six) hours as needed  for wheezing.    [provider]  celecoxib (CELEBREX) 200 MG capsule Take 200 mg by mouth daily. 12/30/18   [provider]  nicotine (NICODERM CQ - DOSED IN MG/24 HOURS) 21 mg/24hr patch Place 1 patch (21 mg total) onto the skin daily. 12/29/16   Barton Dubois, MD  tizanidine (ZANAFLEX) 2 MG capsule Take 4 mg by mouth 3 (three) times daily.    [provider]      VITAL SIGNS:  Blood pressure (!) 89/55, pulse 62, temperature 98.7 F (37.1 C), resp. rate 16, height 5\' 3"  (1.6 m), weight 83 kg, SpO2 100 %.  PHYSICAL EXAMINATION:   Physical Exam  GENERAL:  68 y.o.-year-old patient lying in the bed with no acute distress.  EYES: Pupils equal, round, reactive to light and accommodation. No scleral icterus. Extraocular muscles intact.  HEENT: Head atraumatic, normocephalic. Oropharynx and nasopharynx clear.  NECK:  Supple, no jugular venous distention. No thyroid enlargement, no tenderness.  LUNGS: Normal breath sounds bilaterally, no wheezing, rales,rhonchi or crepitation. No use of accessory muscles of respiration.  CARDIOVASCULAR: S1, S2 normal. No murmurs, rubs, or gallops.  ABDOMEN: Soft, nontender, nondistended. Bowel sounds present. No organomegaly or mass.  EXTREMITIES: No pedal edema, cyanosis, or clubbing. No rash or lesions. + pedal pulses MUSCULOSKELETAL: Normal bulk, and power was 5+ grip and elbow, knee, and ankle flexion and extension bilaterally.  NEUROLOGIC:Alert and oriented x 3. CN 2-12 intact. Sensation to light touch and cold stimuli intact bilaterally.  DTR's (biceps, patellar, and achilles) 2+ and symmetric throughout. Gait not tested due to safety concern. PSYCHIATRIC: The patient is alert and oriented x 3.  SKIN: No obvious rash, lesion, or ulcer.   DATA REVIEWED:  LABORATORY PANEL:   CBC Recent Labs  Lab 01/26/19 0727  WBC 13.5*  HGB 13.4  HCT 38.3  PLT 216    ------------------------------------------------------------------------------------------------------------------  Chemistries  Recent Labs  Lab 01/26/19 0727  NA 135  K 3.2*  CL 92*  CO2 30  GLUCOSE 116*  BUN 53*  CREATININE 1.53*  CALCIUM 9.2  AST 21  ALT 15  ALKPHOS 38  BILITOT 1.1   ------------------------------------------------------------------------------------------------------------------  Cardiac Enzymes No results for input(s): TROPONINI in the last 168 hours. ------------------------------------------------------------------------------------------------------------------  RADIOLOGY:  Ct Head Wo Contrast  Result Date: 01/26/2019 CLINICAL DATA:  Vertigo EXAM: CT HEAD WITHOUT CONTRAST TECHNIQUE: Contiguous axial images were obtained from the base of the skull through  the vertex without intravenous contrast. COMPARISON:  02/01/2016 FINDINGS: Brain: No evidence of acute infarction, hemorrhage, hydrocephalus, extra-axial collection or mass lesion/mass effect. Scattered low-density changes within the periventricular and subcortical white matter compatible with chronic microvascular ischemic change. Normal ventricular size and configuration. Vascular: Mild atherosclerotic calcifications involving the large vessels of the skull base. No unexpected hyperdense vessel. Skull: Normal. Negative for fracture or focal lesion. Sinuses/Orbits: No acute finding. Other: None. IMPRESSION: No acute intracranial findings. Electronically Signed   By: Davina Poke M.D.   On: 01/26/2019 10:30   Dg Chest Portable 1 View  Result Date: 01/26/2019 CLINICAL DATA:  Weakness and nausea for 3 days. EXAM: PORTABLE CHEST 1 VIEW COMPARISON:  Single-view of the chest 12/25/2016. FINDINGS: The lungs are clear. Heart size is normal. No pneumothorax or pleural fluid. Remote left seventh and eighth rib fractures are unchanged. IMPRESSION: No acute disease. Electronically Signed   By: Inge Rise  M.D.   On: 01/26/2019 08:38    EKG:  EKG: normal EKG, normal sinus rhythm, unchanged from previous tracings. Vent. rate 67 BPM PR interval * ms QRS duration 101 ms QT/QTc 455/481 ms P-R-T axes 3 71 68 IMPRESSION AND PLAN:   68 y.o. female with past medical history of COPD, tobacco abuse, ETOH abuse, GAD, HTN, hypertrophic obstructive cardiomyopathy and recurrent pneumonia presenting to the ED with c/o generalized weakness, lightheadedness and nausea.  1. Generalized weakness -unclear etiology - Admit to medsurg - Ct head negative - Work up as below  2. Hypoxia -patient has history of COPD and tobacco abuse.  No evidence of exacerbation (cough, shortness of breath or sputum production) - Supplemental O2, goal sat 88-92% - Bronchodilators (albuterol/ipratropium) standing and PRN  3. Leukocytosis : WBC 13.5 - Monitor fever curve - UA shows mild leuks and rare bacteria, patient is asymptomatic - CXR-no active cardiopulmonary process - We will obtain CT chest without contrast - Holding empiric abx - Cultures pending - Trend WBC's   4. Acute kidney injury - Hold nephrotoxins - IVFs hydration - Monitor renal function  5. HTN -now hypotensive + Goal BP <130/80 - Hold BP meds  6. Tobacco abuse - Patient reports quitting 2 weeks ago - Offered Chantix but declined  7. DVT prophylaxis - Enoxaparin  SubQ   All the records are reviewed and case discussed with ED provider. Management plans discussed with the patient, family and they are in agreement.  CODE STATUS: FULL  TOTAL TIME TAKING CARE OF THIS PATIENT: 50 minutes.    on 01/26/2019 at 12:35 PM  Rufina Falco, DNP, FNP-BC [facility] Nurse Practitioner Between 7am to 6pm - Pager [phone]  After 6pm go to [email or web address] - password [email or web address] [facility]  Office  [phone]  CC: Primary care physician; Patient, No Pcp Per

## 2019-01-26 NOTE — ED Provider Notes (Signed)
[facility] Emergency Department Provider Note  Time seen: 7:29 AM  I have reviewed the triage vital signs and the nursing notes.   HISTORY  Chief Complaint Weakness   HPI Sarah Barnes is a 68 y.o. female with a past medical history of anxiety, COPD, gastric reflux, hypertension, hyperlipidemia, presents to the emergency department for generalized weakness.  According to the patient for the past 3 days she has had a sensation of generalized weakness, difficulty getting around, etc.  Patient states very little appetite over the past 3 days.  Denies any fever cough or congestion.  Denies vomiting or diarrhea although states nausea today.  Denies dysuria.  Denies chest or abdominal pain.  Patient is noted to be hypoxic around 87% upon arrival, no baseline O2 requirement.  Patient is also noted to be hypotensive 88/47 upon arrival.   Past Medical History:  Diagnosis Date  . Agoraphobia with panic disorder   . Anemia   . Arthritis    "all over" (08/13/2016)  . Chronic bronchitis ([place])   . COPD (chronic obstructive pulmonary disease) ([place])   . Depression   . Dyspnea    occ  . Dysrhythmia    palpitations occ due to anxiety  . ETOH abuse   . GAD (generalized anxiety disorder)   . GERD (gastroesophageal reflux disease)   . Headache    "daily til I got glasses; now have headache once in awhile" (08/13/2016)  . Hepatitis 1960   "isolated &  hospitalized for 2 weeks; don't know which kind of hepatitis"   . Hernia, hiatal   . High cholesterol   . Hypertension   . Hypertrophic obstructive cardiomyopathy ([place])   . Mild aortic stenosis   . Pneumonia    "once or twice" (08/13/2016)  . PONV (postoperative nausea and vomiting)   . Sciatica     Patient Active Problem List   Diagnosis Date Noted  . Alcohol use disorder   . Hypertrophic obstructive cardiomyopathy ([place])   . Mild aortic stenosis   . Abnormal EKG 12/26/2016  . Troponin level elevated 12/26/2016  .  Syncope 12/25/2016  . Heart palpitations - PACs 12/24/2016  . Shortness of breath at rest 12/24/2016  . Ventral hernia 08/13/2016  . Panic disorder with agoraphobia 07/13/2015  . Major depressive disorder, recurrent severe without psychotic features ([place]) 07/12/2015  . Tobacco use disorder 07/12/2015  . Substance abuse ([place]) 07/12/2015  . Knee pain, bilateral 09/17/2014  . Generalized anxiety disorder 05/17/2014  . Hypertriglyceridemia 05/05/2014  . GERD (gastroesophageal reflux disease) 05/05/2014  . Essential hypertension 05/05/2014    Past Surgical History:  Procedure Laterality Date  . ANAL FISTULECTOMY  1970s X 3  . BACK SURGERY    . HERNIA REPAIR    . KNEE ARTHROSCOPY Right   . LAPAROSCOPIC CHOLECYSTECTOMY    . LAPAROSCOPIC INCISIONAL / UMBILICAL / VENTRAL HERNIA REPAIR  08/13/2016   VHR w/mesh  . LUMBAR DISC SURGERY     "herniated disc"  . NASAL FRACTURE SURGERY  1970s X 2  . RIGHT/LEFT HEART CATH AND CORONARY ANGIOGRAPHY N/A 12/27/2016   Procedure: RIGHT/LEFT HEART CATH AND CORONARY ANGIOGRAPHY;  Surgeon: Leonie Man, MD;  Location: [place] INVASIVE CV LAB: Mild-moderate pulmonary hypertension.  Hyperdynamic ventricle noted.  EF 55-65% -hyperdynamic (unable to measure LVOT gradient).  Mildly elevated very tortuous but angiographically normal coronary arteries, suggesting hypertensive heart disease  . SHOULDER ARTHROSCOPY WITH ROTATOR CUFF REPAIR Right 1990s?  . TONSILLECTOMY  1951  .  TRANSTHORACIC ECHOCARDIOGRAM  12/2016    EF 60-65% with dynamic outflow tract obstruction at rest. Peak gradient 52 mmHg consistent with HOCM.  GRII DD area. Aortic sclerosis but no stenosis. Systolic anterior motion of mitral valve chordae. Mild mitral stenosis. Moderate LA dilation and mild RA dilation. Peak PA pressures 35 mmHg.  Marland Kitchen VENTRAL HERNIA REPAIR N/A 08/13/2016   Procedure: LAPAROSCOPIC VENTRAL HERNIA REPAIR WITH MESH;  Surgeon: Judeth Horn, MD;  Location: [place];  Service: General;   Laterality: N/A;    Prior to Admission medications   Medication Sig Start Date End Date Taking? Authorizing Provider  aspirin EC 81 MG EC tablet Take 1 tablet (81 mg total) by mouth daily. 12/29/16   Barton Dubois, MD  busPIRone (BUSPAR) 15 MG tablet Take 1 tablet (15 mg total) by mouth 2 (two) times daily. 10/11/16   Saguier, Percell Miller, PA-C  carvedilol (COREG) 25 MG tablet Take 1 tablet (25 mg total) by mouth 2 (two) times daily. NEED OV. 06/18/18   Leonie Man, MD  fenofibrate micronized (LOFIBRA) 134 MG capsule Take 1 capsule (134 mg total) by mouth daily before breakfast. NEEDS APPOINTMENT FOR FUTURE REFILLS 06/20/18   Leonie Man, MD  furosemide (LASIX) 40 MG tablet Take 1 tablet (40 mg total) by mouth daily. NEEDS APPOINTMENT FOR FUTURE REFILLS 07/07/18   Leonie Man, MD  hydrochlorothiazide (MICROZIDE) 12.5 MG capsule Take 1 capsule (12.5 mg total) by mouth daily. OV NEEDED 10/03/18   Leonie Man, MD  Multiple Vitamin (MULTIVITAMIN WITH MINERALS) TABS tablet Take 1 tablet by mouth daily. 12/29/16   Barton Dubois, MD  nicotine (NICODERM CQ - DOSED IN MG/24 HOURS) 21 mg/24hr patch Place 1 patch (21 mg total) onto the skin daily. 12/29/16   Barton Dubois, MD  pantoprazole (PROTONIX) 40 MG tablet Take 1 tablet (40 mg total) by mouth daily. NEEDS APPOINTMENT FOR FUTURE REFILLS 06/20/18   Leonie Man, MD  prazosin (MINIPRESS) 2 MG capsule Take 1 capsule (2 mg total) by mouth daily. NEEDS APPOINTMENT FOR FUTURE REFILLS 06/20/18   Leonie Man, MD  traZODone (DESYREL) 100 MG tablet TAKE 1 TABLET BY MOUTH EVERYDAY AT BEDTIME 04/23/18   Leonie Man, MD  valsartan (DIOVAN) 160 MG tablet Take 1 tablet (160 mg total) by mouth daily. NEEDS APPOINTMENT FOR FUTURE REFILLS 06/20/18   Leonie Man, MD    No Known Allergies  Family History  Problem Relation Age of Onset  . Diabetes Mother   . Hyperlipidemia Mother   . Hypertension Mother   . Heart attack Mother 85  . Angina  Mother        chronic problem  . Hypertension Father   . Stroke Father   . Cancer Brother     Social History Social History   Tobacco Use  . Smoking status: Current Every Day Smoker    Packs/day: 1.50    Years: 49.00    Pack years: 73.50    Types: Cigarettes  . Smokeless tobacco: Never Used  Substance Use Topics  . Alcohol use: Yes    Alcohol/week: 84.0 standard drinks    Types: 36 Cans of beer, 48 Shots of liquor per week    Comment: 08/13/2016 "couple beers-1 quart vodka daily; depends on my mood; 3, 12 packs of beer/week 1 1/2 quarts vodka/week""  . Drug use: Yes    Frequency: 1.0 times per week    Types: Marijuana, Cocaine    Comment: Cocaine + March 2017  Review of Systems Constitutional: Negative for fever. ENT: Negative for recent illness/congestion Cardiovascular: Negative for chest pain. Respiratory: Negative for shortness of breath.  Negative for cough. Gastrointestinal: Negative for abdominal pain, vomiting and diarrhea.  Positive for nausea. Genitourinary: Negative for urinary compaints Musculoskeletal: Negative for musculoskeletal complaints Skin: Negative for skin complaints  Neurological: Negative for headache All other ROS negative  ____________________________________________   PHYSICAL EXAM:  VITAL SIGNS: ED Triage Vitals  Enc Vitals Group     BP 01/26/19 0715 (!) 88/47     Pulse Rate 01/26/19 0715 68     Resp 01/26/19 0715 16     Temp 01/26/19 0715 98.8 F (37.1 C)     Temp Source 01/26/19 0715 Oral     SpO2 01/26/19 0715 (!) 87 %     Weight 01/26/19 0713 183 lb (83 kg)     Height 01/26/19 0713 5\' 3"  (1.6 m)     Head Circumference --      Peak Flow --      Pain Score 01/26/19 0713 0     Pain Loc --      Pain Edu? --      Excl. in [place]? --    Constitutional: Alert and oriented. Well appearing and in no distress. Eyes: Normal exam ENT      Head: Normocephalic and atraumatic.      Mouth/Throat: Mucous membranes are  moist. Cardiovascular: Normal rate, regular rhythm. No murmur Respiratory: Normal respiratory effort without tachypnea nor retractions. Breath sounds are clear Gastrointestinal: Soft and nontender. No distention.  Musculoskeletal: Nontender with normal range of motion in all extremities.  Neurologic:  Normal speech and language. No gross focal neurologic deficits  Skin:  Skin is warm, dry and intact.  Psychiatric: Mood and affect are normal.   ____________________________________________    EKG  EKG viewed and interpreted by myself shows a sinus rhythm at 67 bpm with a narrow QRS, normal axis, normal intervals, no concerning ST changes.  ____________________________________________    RADIOLOGY  Chest x-ray negative ____________________________________________   INITIAL IMPRESSION / ASSESSMENT AND PLAN / ED COURSE  Pertinent labs & imaging results that were available during my care of the patient were reviewed by me and considered in my medical decision making (see chart for details).   Patient presents to the emergency department for 3 days of generalized fatigue and weakness.  Patient noted to be hypotensive and hypoxic upon arrival.  Differential is quite broad but would include infectious etiology such as pneumonia, urinary tract infection, COVID, would also include metabolic abnormality such as renal failure, electrolyte abnormality, substance use.  We will check labs, chest x-ray, COVID swab.  We will begin IV hydration while awaiting results.  Patient agreeable to plan of care.  Patient satting well on 2 L of oxygen.  Chest x-ray is negative.  Labs have resulted showing renal insufficiency baseline creatinine 0.5 currently 1.5.  This could explain the patient's initial hypotension as well as slight troponin elevation.  Patient will be admitted to the hospitalist service for continued work-up and treatment.  Patient's blood pressure is responding nicely to fluids currently  100/53.  Sarah Barnes was evaluated in Emergency Department on 01/26/2019 for the symptoms described in the history of present illness. She was evaluated in the context of the global COVID-19 pandemic, which necessitated consideration that the patient might be at risk for infection with the SARS-CoV-2 virus that causes COVID-19. Institutional protocols and algorithms that pertain to the evaluation of  patients at risk for COVID-19 are in a state of rapid change based on information released by regulatory bodies including the CDC and federal and state organizations. These policies and algorithms were followed during the patient's care in the ED.  ____________________________________________   FINAL CLINICAL IMPRESSION(S) / ED DIAGNOSES  Weakness Hypoxia Hypotension   Harvest Dark, MD 01/26/19 [phone]

## 2019-01-26 NOTE — ED Notes (Signed)
Ambulated pt to restroom in room. sats decreased to 86 % room air with ambulation. Returned to 2 L [place].

## 2019-01-26 NOTE — ED Notes (Addendum)
Pt ambulated to toilet to urinate with minimal assistance. Pt then back to bed and placed on cardiac monitor. Pt given a warm blanket. Pt aware to use call light for any needs. Pt has no further needs at this time.

## 2019-01-26 NOTE — ED Notes (Signed)
Patient transported to CT 

## 2019-01-27 ENCOUNTER — Inpatient Hospital Stay
Admit: 2019-01-27 | Discharge: 2019-01-27 | Disposition: A | Discharge status: Home / Self Care | Payer: Medicare Other | Attending: Nurse Practitioner | Admitting: Nurse Practitioner

## 2019-01-27 LAB — CBC
HCT: 35.1 % — ABNORMAL LOW (ref 36.0–46.0)
Hemoglobin: 11.6 g/dL — ABNORMAL LOW (ref 12.0–15.0)
MCH: 31.3 pg (ref 26.0–34.0)
MCHC: 33 g/dL (ref 30.0–36.0)
MCV: 94.6 fL (ref 80.0–100.0)
Platelets: 179 10*3/uL (ref 150–400)
RBC: 3.71 MIL/uL — ABNORMAL LOW (ref 3.87–5.11)
RDW: 12.7 % (ref 11.5–15.5)
WBC: 9.1 10*3/uL (ref 4.0–10.5)
nRBC: 0 % (ref 0.0–0.2)

## 2019-01-27 LAB — URINE CULTURE

## 2019-01-27 LAB — COMPREHENSIVE METABOLIC PANEL
ALT: 12 U/L (ref 0–44)
AST: 16 U/L (ref 15–41)
Albumin: 3.9 g/dL (ref 3.5–5.0)
Alkaline Phosphatase: 31 U/L — ABNORMAL LOW (ref 38–126)
Anion gap: 8 (ref 5–15)
BUN: 45 mg/dL — ABNORMAL HIGH (ref 8–23)
CO2: 30 mmol/L (ref 22–32)
Calcium: 8.8 mg/dL — ABNORMAL LOW (ref 8.9–10.3)
Chloride: 101 mmol/L (ref 98–111)
Creatinine, Ser: 1.33 mg/dL — ABNORMAL HIGH (ref 0.44–1.00)
GFR calc Af Amer: 47 mL/min — ABNORMAL LOW (ref >60)
GFR calc non Af Amer: 40 mL/min — ABNORMAL LOW (ref >60)
Glucose, Bld: 112 mg/dL — ABNORMAL HIGH (ref 70–99)
Potassium: 3.8 mmol/L (ref 3.5–5.1)
Sodium: 139 mmol/L (ref 135–145)
Total Bilirubin: 0.7 mg/dL (ref 0.3–1.2)
Total Protein: 6.2 g/dL — ABNORMAL LOW (ref 6.5–8.1)

## 2019-01-27 LAB — ECHOCARDIOGRAM COMPLETE
Height: 63 in
Weight: 2892.44 oz

## 2019-01-27 LAB — MAGNESIUM: Magnesium: 2.4 mg/dL (ref 1.7–2.4)

## 2019-01-27 MED ORDER — NICOTINE 14 MG/24HR TD PT24
14.0000 mg | MEDICATED_PATCH | Freq: Every day | TRANSDERMAL | Status: DC
  Administered 2019-01-27: 14 mg via TRANSDERMAL
  Filled 2019-01-27: qty 1

## 2019-01-27 MED ORDER — POLYETHYLENE GLYCOL 3350 17 G PO PACK: 17.0000 g | PACK | Freq: Every day | ORAL | Status: DC | PRN

## 2019-01-27 NOTE — Evaluation (Signed)
Physical Therapy Evaluation Patient Details Name: Sarah Barnes MRN: [id number] DOB: 1950-07-02 Today's Date: 01/27/2019   History of Present Illness  68 year old female with past medical history of COPD, tobacco abuse, ETOH abuse, GAD, HTN, hypertrophic obstructive cardiomyopathy and recurrent pneumonia presenting to the ED with c/o generalized weakness, lightheadedness and nausea.  Clinical Impression  Pt did relatively well with mobility, balance, ambulation but per report she is not at her baseline.  She showed good confidence moving and balancing in the bed.  She showed ability to ambulate ~250 ft and negotiate full flight of steps w/o AD and w/o LOBs or overt safety issues.  Her vitals remained stable and generally she feels well enough to go home w/o issue.  However she has consistently been feeling lightheaded/"fuzzy" and has had a few falls this year.  Pt was (-) on head imaging, but did show some mild coordination issues with R hand as well as endorsing feeling voice feels weak.  Pt is safe from PT perspective to go home (Sister will continue to assist with driving, errands,etc) but would benefit from HHPT to work back to [employer] and confidence with mobility/ambulation/ADLs/etc.  Follow Up Recommendations Home health PT    Equipment Recommendations  None recommended by PT(did recommend that she use cane at least on "bad days")    Recommendations for Other Services       Precautions / Restrictions Precautions Precautions: Fall Restrictions Weight Bearing Restrictions: No      Mobility  Bed Mobility Overal bed mobility: Independent             General bed mobility comments: Pt easily gets to sitting EOB w/o assist  Transfers Overall transfer level: Independent Equipment used: None             General transfer comment: Pt able to rise and maintain balance w/o assist, reports mild unsteadiness   Ambulation/Gait Ambulation/Gait assistance: Modified independent  (Device/Increase time) Gait Distance (Feet): 250 Feet Assistive device: None   Gait velocity: Pt with consistent cadence and no LOBs.  She had some mild fatigue with the effort but sats stayed in the mid 90s on room air and HR also remained relatively stable.       Stairs Stairs: Yes Stairs assistance: Supervision Stair Management: Two rails Number of Stairs: 20 General stair comments: Pt with was able to negotiate up/down steps reciprocally with b/l UE use, no LOBs and only minimal fatigue  Wheelchair Mobility    Modified Rankin (Stroke Patients Only)       Balance Overall balance assessment: Modified Independent                                           Pertinent Vitals/Pain Pain Assessment: (mild head ache)    Home Living Family/patient expects to be discharged to:: Private residence Living Arrangements: Alone Available Help at Discharge: Family(sister takes her shopping, checks in regularly) Type of Home: Apartment Home Access: Stairs to enter Entrance Stairs-Rails: Can reach both Entrance Stairs-Number of Steps: 16 Home Layout: One level Home Equipment: Walker - 2 wheels;Cane - single point(does not ever use)      Prior Function Level of Independence: Independent         Comments: Pt reports that she does not carry groceries up steps (Sister does that) but that she can generally be active and independent  Hand Dominance        Extremity/Trunk Assessment   Upper Extremity Assessment Upper Extremity Assessment: Overall WFL for tasks assessed(mild decreased coordination on R (finger opposition))    Lower Extremity Assessment Lower Extremity Assessment: Overall WFL for tasks assessed       Communication   Communication: No difficulties  Cognition Arousal/Alertness: Awake/alert Behavior During Therapy: WFL for tasks assessed/performed Overall Cognitive Status: Within Functional Limits for tasks assessed                                         General Comments General comments (skin integrity, edema, etc.): Pt reports that head vaguely feeling fuzzy at times, able to hold conversation, maintain balance, etc t/o the effort    Exercises     Assessment/Plan    PT Assessment Patient needs continued PT services  PT Problem List Decreased balance;Decreased coordination;Decreased activity tolerance;Decreased safety awareness;Decreased knowledge of use of DME       PT Treatment Interventions DME instruction;Gait training;Stair training;Functional mobility training;Therapeutic activities;Therapeutic exercise;Balance training;Neuromuscular re-education;Patient/family education    PT Goals (Current goals can be found in the Care Plan section)  Acute Rehab PT Goals Patient Stated Goal: go home PT Goal Formulation: With patient Time For Goal Achievement: 02/10/19 Potential to Achieve Goals: Good    Frequency Min 2X/week   Barriers to discharge        Co-evaluation               AM-PAC PT "6 Clicks" Mobility  Outcome Measure Help needed turning from your back to your side while in a flat bed without using bedrails?: None Help needed moving from lying on your back to sitting on the side of a flat bed without using bedrails?: None Help needed moving to and from a bed to a chair (including a wheelchair)?: None Help needed standing up from a chair using your arms (e.g., wheelchair or bedside chair)?: None Help needed to walk in hospital room?: None Help needed climbing 3-5 steps with a railing? : A Little 6 Click Score: 23    End of Session Equipment Utilized During Treatment: Gait belt Activity Tolerance: Patient tolerated treatment well Patient left: with call bell/phone within reach;with bed alarm set Nurse Communication: Mobility status PT Visit Diagnosis: Muscle weakness (generalized) (M62.81);Unsteadiness on feet (R26.81)    Time: [id number] PT Time Calculation (min) (ACUTE  ONLY): 24 min   Charges:   PT Evaluation $PT Eval Low Complexity: 1 Low          Kreg Shropshire, DPT 01/27/2019, 1:00 PM

## 2019-01-27 NOTE — Progress Notes (Signed)
[facility] [place] NAME: Sarah Barnes    MR#:  [id number]  DATE OF BIRTH:  November 18, 1950  SUBJECTIVE:  CHIEF COMPLAINT:   Chief Complaint  Patient presents with  . Weakness   The patient still has generalized weakness and dizziness. REVIEW OF SYSTEMS:  Review of Systems  Constitutional: Positive for malaise/fatigue. Negative for chills and fever.  HENT: Negative for sore throat.   Eyes: Negative for blurred vision and double vision.  Respiratory: Negative for cough, hemoptysis, shortness of breath, wheezing and stridor.   Cardiovascular: Negative for chest pain, palpitations, orthopnea and leg swelling.  Gastrointestinal: Positive for constipation. Negative for abdominal pain, blood in stool, diarrhea, melena, nausea and vomiting.  Genitourinary: Negative for dysuria, flank pain and hematuria.  Musculoskeletal: Negative for back pain and joint pain.  Skin: Negative for rash.  Neurological: Positive for dizziness. Negative for sensory change, focal weakness, seizures, loss of consciousness, weakness and headaches.  Endo/Heme/Allergies: Negative for polydipsia.  Psychiatric/Behavioral: Negative for depression. The patient is not nervous/anxious.     DRUG ALLERGIES:  No Known Allergies VITALS:  Blood pressure 118/64, pulse (!) 58, temperature 98.1 F (36.7 C), resp. rate 19, height 5\' 3"  (1.6 m), weight 82 kg, SpO2 96 %. PHYSICAL EXAMINATION:  Physical Exam HENT:     Head: Normocephalic.     Mouth/Throat:     Mouth: Mucous membranes are moist.  Eyes:     General: No scleral icterus.    Conjunctiva/sclera: Conjunctivae normal.     Pupils: Pupils are equal, round, and reactive to light.  Neck:     Musculoskeletal: Normal range of motion and neck supple.     Vascular: No JVD.     Trachea: No tracheal deviation.  Cardiovascular:     Rate and Rhythm: Normal rate and regular rhythm.     Heart sounds: Normal heart sounds. No  murmur. No gallop.   Pulmonary:     Effort: Pulmonary effort is normal. No respiratory distress.     Breath sounds: Normal breath sounds. No wheezing or rales.  Abdominal:     General: Bowel sounds are normal. There is no distension.     Palpations: Abdomen is soft.     Tenderness: There is no abdominal tenderness. There is no rebound.  Musculoskeletal: Normal range of motion.        General: No tenderness.     Right lower leg: No edema.     Left lower leg: No edema.  Skin:    Findings: No erythema or rash.  Neurological:     General: No focal deficit present.     Mental Status: She is alert and oriented to person, place, and time.     Cranial Nerves: No cranial nerve deficit.  Psychiatric:        Mood and Affect: Mood normal.    LABORATORY PANEL:  Female CBC Recent Labs  Lab 01/27/19 0616  WBC 9.1  HGB 11.6*  HCT 35.1*  PLT 179   ------------------------------------------------------------------------------------------------------------------ Chemistries  Recent Labs  Lab 01/27/19 0616  NA 139  K 3.8  CL 101  CO2 30  GLUCOSE 112*  BUN 45*  CREATININE 1.33*  CALCIUM 8.8*  MG 2.4  AST 16  ALT 12  ALKPHOS 31*  BILITOT 0.7   RADIOLOGY:  No results found. ASSESSMENT AND PLAN:   68 y.o. female with past medical history of COPD, tobacco abuse, ETOH abuse, GAD, HTN, hypertrophic obstructive cardiomyopathy and recurrent  pneumonia presenting to the ED with c/o generalized weakness, lightheadedness and nausea.  1. Generalized weakness -possible due to dehydration and renal failure. - Ct head negative.  She has been treated with IV fluid support.  2. Hypoxia -patient has history of COPD and tobacco abuse.  No evidence of exacerbation (cough, shortness of breath or sputum production) - Supplemental O2, goal sat 88-92% - Bronchodilators (albuterol/ipratropium) standing and PRN Improved.  Off oxygen.  3. Leukocytosis : WBC 13.5 - Monitor fever curve - UA shows  mild leuks and rare bacteria, patient is asymptomatic - CXR-no active cardiopulmonary process - We will obtain CT chest without contrast - Holding empiric abx - Cultures pending Improved.  4. Acute kidney injury - Hold nephrotoxins Continue IVFs hydration, follow-up BMP.  5. HTN -now hypotensive + Goal BP <130/80 - Hold BP meds  6. Tobacco abuse - Patient reports quitting 2 weeks ago - Offered Chantix but declined.  All the records are reviewed and case discussed with Care Management/Social Worker. Management plans discussed with the patient, family and they are in agreement.  CODE STATUS: Full Code  TOTAL TIME TAKING CARE OF THIS PATIENT: 32 minutes.  More than 50% of the time was spent in counseling/coordination of care: YES  POSSIBLE D/C IN 1-2 DAYS, DEPENDING ON CLINICAL CONDITION.   Demetrios Loll M.D on 01/27/2019 at 2:41 PM  Between 7am to 6pm - Pager - [phone]  After 6pm go to [email or web address] - [facility]

## 2019-01-27 NOTE — TOC Initial Note (Signed)
Transition of Care [facility]) - Initial/Assessment Note    Patient Details  Name: Sarah Barnes MRN: [id number] Date of Birth: December 22, 1950  Transition of Care [facility]) CM/SW Contact:    Shelbie Hutching, RN Phone Number: 01/27/2019, 2:42 PM  Clinical Narrative:                 Patient admitted with weakness.  Patient reports that she is from [facility] and lives alone, she was in [place] visiting her nephew when she came into the hospital.  Patient's sister helps her run errands and takes her to appointments.  PT recommends home health PT and patient agrees.  RNCM attempting to find accepting agency.  Patient has a rolling walker and cane at home.  Patient reports her PCP is Dr. Holley Raring and she has an appointment scheduled for 02/05/19.      Expected Discharge Plan: [place] Barriers to Discharge: Continued Medical Work up   Patient Goals and CMS Choice Patient states their goals for this hospitalization and ongoing recovery are:: just to get home and see her dog CMS Medicare.gov Compare Post Acute Care list provided to:: Patient Choice offered to / list presented to : Patient  Expected Discharge Plan and Services Expected Discharge Plan: [place]   Discharge Planning Services: CM Consult Post Acute Care Choice: [place] arrangements for the past 2 months: Single Family Home                           HH Arranged: PT          Prior Living Arrangements/Services Living arrangements for the past 2 months: Single Family Home Lives with:: Self Patient language and need for interpreter reviewed:: No Do you feel safe going back to the place where you live?: Yes      Need for Family Participation in Patient Care: Yes (Comment) Care giver support system in place?: Yes (comment)(sister)   Criminal Activity/Legal Involvement Pertinent to Current Situation/Hospitalization: No - Comment as needed  Activities of Daily Living Home Assistive  Devices/Equipment: None ADL Screening (condition at time of admission) Patient's cognitive ability adequate to safely complete daily activities?: Yes Is the patient deaf or have difficulty hearing?: No Does the patient have difficulty seeing, even when wearing glasses/contacts?: No Does the patient have difficulty concentrating, remembering, or making decisions?: No Patient able to express need for assistance with ADLs?: Yes Does the patient have difficulty dressing or bathing?: No Independently performs ADLs?: Yes (appropriate for developmental age) Does the patient have difficulty walking or climbing stairs?: No Weakness of Legs: None Weakness of Arms/Hands: None  Permission Sought/Granted Permission sought to share information with : Case Manager, Chartered certified accountant granted to share information with : Yes, Verbal Permission Granted     Permission granted to share info w AGENCY: Home health agency of choice        Emotional Assessment Appearance:: Appears stated age Attitude/Demeanor/Rapport: Engaged Affect (typically observed): Accepting Orientation: : Oriented to Self, Oriented to Place, Oriented to  Time, Oriented to Situation Alcohol / Substance Use: Not Applicable Psych Involvement: No (comment)  Admission diagnosis:  Dehydration [E86.0] Weakness [R53.1] Renal insufficiency [N28.9] Hypoxia [R09.02] Hypotension, unspecified hypotension type [I95.9] Patient Active Problem List   Diagnosis Date Noted  . Generalized weakness 01/26/2019  . Alcohol use disorder   . Hypertrophic obstructive cardiomyopathy ([place])   . Mild aortic stenosis   . Abnormal  EKG 12/26/2016  . Troponin level elevated 12/26/2016  . Syncope 12/25/2016  . Heart palpitations - PACs 12/24/2016  . Shortness of breath at rest 12/24/2016  . Ventral hernia 08/13/2016  . Panic disorder with agoraphobia 07/13/2015  . Major depressive disorder, recurrent severe without psychotic  features ([place]) 07/12/2015  . Tobacco use disorder 07/12/2015  . Substance abuse ([place]) 07/12/2015  . Knee pain, bilateral 09/17/2014  . Generalized anxiety disorder 05/17/2014  . Hypertriglyceridemia 05/05/2014  . GERD (gastroesophageal reflux disease) 05/05/2014  . Essential hypertension 05/05/2014   PCP:  Patient, No Pcp Per Pharmacy:   [place], [place] [id number] [place] [id number] Phone: [phone] Fax: [place], [place] [address] Phone: [phone] Fax: [phone]  [id number] [place], [place] [id number] [place] [address] Phone: [phone] Fax: [place], [place] [place] [address] Phone: [phone] Fax: [phone]     Social Determinants of Health (SDOH) Interventions    Readmission Risk Interventions No flowsheet data found.

## 2019-01-27 NOTE — Progress Notes (Signed)
*  PRELIMINARY RESULTS* Echocardiogram 2D Echocardiogram has been performed.  Sarah Barnes 01/27/2019, 9:42 AM

## 2019-01-28 LAB — BASIC METABOLIC PANEL
Anion gap: 9 (ref 5–15)
BUN: 35 mg/dL — ABNORMAL HIGH (ref 8–23)
CO2: 29 mmol/L (ref 22–32)
Calcium: 8.9 mg/dL (ref 8.9–10.3)
Chloride: 103 mmol/L (ref 98–111)
Creatinine, Ser: 1.15 mg/dL — ABNORMAL HIGH (ref 0.44–1.00)
GFR calc Af Amer: 56 mL/min — ABNORMAL LOW (ref >60)
GFR calc non Af Amer: 48 mL/min — ABNORMAL LOW (ref >60)
Glucose, Bld: 101 mg/dL — ABNORMAL HIGH (ref 70–99)
Potassium: 3.6 mmol/L (ref 3.5–5.1)
Sodium: 141 mmol/L (ref 135–145)

## 2019-01-28 LAB — HIV ANTIBODY (ROUTINE TESTING W REFLEX): HIV Screen 4th Generation wRfx: NONREACTIVE

## 2019-01-28 MED ORDER — FOLIC ACID 1 MG PO TABS: 1.0000 mg | ORAL_TABLET | Freq: Every day | ORAL | 0 refills | Status: AC

## 2019-01-28 MED ORDER — ALPRAZOLAM 0.25 MG PO TABS
0.2500 mg | ORAL_TABLET | Freq: Once | ORAL | Status: AC
  Administered 2019-01-28: 0.25 mg via ORAL
  Filled 2019-01-28: qty 1

## 2019-01-28 MED ORDER — THIAMINE HCL 100 MG PO TABS: 100.0000 mg | ORAL_TABLET | Freq: Every day | ORAL | 0 refills | Status: DC

## 2019-01-28 NOTE — Progress Notes (Signed)
Discharge instructions given and went over with patient at bedside. All questions answered. Patient discharged home. Ebonye Reade S, RN  

## 2019-01-28 NOTE — Progress Notes (Signed)
   01/28/19 1000  Clinical Encounter Type  Visited With Patient  Visit Type Initial  Referral From Nurse  Stress Factors  Patient Stress Factors Family relationships;Financial concerns;Loss of control  Ch was rounding. Pt shared about her concerns and distress in her life. She is in financial strain and is worried that her rent is coming up soon. She lost her girlfriend and brother with whom she had a very close relationship and she is grieving the loss. She especially misses going fishing with her brother. Pt started crying talking about her sister who she is close to. She said her sister doesn't understand her distress and wouldn't embrace her with her worries and concerns and brushes her off. Pt cares for her deeply and just wants her to be there for her the same way. Ch encouraged her to continue staying in an honest conversation with her and provided a listening ear. Pt will be discharged this noon and ch will check in one last time to see how she has processed her feelings.

## 2019-01-28 NOTE — Progress Notes (Signed)
Pt refuses bed alarm. Educated provided. Refusal Stands

## 2019-01-28 NOTE — Discharge Instructions (Signed)
Smoking cessation  

## 2019-01-28 NOTE — Discharge Summary (Signed)
[place] at [place] NAME: Sarah Barnes    MR#:  [id number]  DATE OF BIRTH:  [birth year]  DATE OF ADMISSION:  01/26/2019   ADMITTING PHYSICIAN: Lang Snow, NP  DATE OF DISCHARGE: 01/28/2019 PRIMARY CARE PHYSICIAN: Guadlupe Spanish, MD   ADMISSION DIAGNOSIS:  Dehydration [E86.0] Weakness [R53.1] Renal insufficiency [N28.9] Hypoxia [R09.02] Hypotension, unspecified hypotension type [I95.9] DISCHARGE DIAGNOSIS:  Active Problems:   Generalized weakness  SECONDARY DIAGNOSIS:   Past Medical History:  Diagnosis Date  . Agoraphobia with panic disorder   . Anemia   . Arthritis    "all over" (08/13/2016)  . Chronic bronchitis ([place])   . COPD (chronic obstructive pulmonary disease) ([place])   . Depression   . Dyspnea    occ  . Dysrhythmia    palpitations occ due to anxiety  . ETOH abuse   . GAD (generalized anxiety disorder)   . GERD (gastroesophageal reflux disease)   . Headache    "daily til I got glasses; now have headache once in awhile" (08/13/2016)  . Hepatitis 1960   "isolated &  hospitalized for 2 weeks; don't know which kind of hepatitis"   . Hernia, hiatal   . High cholesterol   . Hypertension   . Hypertrophic obstructive cardiomyopathy ([place])   . Mild aortic stenosis   . Pneumonia    "once or twice" (08/13/2016)  . PONV (postoperative nausea and vomiting)   . Sciatica    HOSPITAL COURSE:  68 y.o.femalewith past medical history of COPD, tobacco abuse, ETOH abuse, GAD, HTN, hypertrophic obstructive cardiomyopathy and recurrent pneumonia presenting to the ED with c/o generalized weakness, lightheadedness and nausea.  1.Generalized weakness-possible due to dehydration and renal failure. - Ct head negative.  She has been treated with IV fluid support.  2.Hypoxia-patient has history of COPD and tobacco abuse. No evidence of exacerbation (cough, shortness of breath or sputum production) - Supplemental O2,  goal sat 88-92% - Bronchodilators (albuterol/ipratropium) standing and PRN Improved.  Off oxygen.  3.Leukocytosis : [id number] - Monitor fever curve - UAshows mild leuks and rare bacteria, patient isasymptomatic - CXR-no active cardiopulmonary process -We will obtain CT chest without contrast -Holding empiric abx - Cultures pending Improved.  4.Acute kidney injury -Hold nephrotoxins Improved with IVFshydration.  Hold diuretics.  5.HTN  Hypotension improved.  BP is soft. Hold BP meds.  Follow-up PCP to resume.  6. Tobacco abuse -Patient reports quitting 2 weeks ago -Offered Chantix but declined.  DISCHARGE CONDITIONS:  Stable, discharge to home with home health physical therapy. CONSULTS OBTAINED:   DRUG ALLERGIES:  No Known Allergies DISCHARGE MEDICATIONS:   Allergies as of 01/28/2019   No Known Allergies     Medication List    STOP taking these medications   carvedilol 25 MG tablet Commonly known as: COREG   furosemide 40 MG tablet Commonly known as: LASIX   hydrochlorothiazide 25 MG tablet Commonly known as: HYDRODIURIL   valsartan 160 MG tablet Commonly known as: Diovan     TAKE these medications   albuterol 108 (90 Base) MCG/ACT inhaler Commonly known as: VENTOLIN HFA Inhale 2 puffs into the lungs every 6 (six) hours as needed for wheezing.   aspirin 81 MG EC tablet Take 1 tablet (81 mg total) by mouth daily.   busPIRone 15 MG tablet Commonly known as: BUSPAR Take 1 tablet (15 mg total) by mouth 2 (two) times daily.   celecoxib 200 MG capsule Commonly known as: CELEBREX Take  200 mg by mouth daily.   escitalopram 10 MG tablet Commonly known as: LEXAPRO Take 10 mg by mouth daily.   fenofibrate micronized 134 MG capsule Commonly known as: LOFIBRA Take 1 capsule (134 mg total) by mouth daily before breakfast. NEEDS APPOINTMENT FOR FUTURE REFILLS   folic acid 1 MG tablet Commonly known as: FOLVITE Take 1 tablet (1 mg total) by  mouth daily.   multivitamin with minerals Tabs tablet Take 1 tablet by mouth daily.   nicotine 21 mg/24hr patch Commonly known as: NICODERM CQ - dosed in mg/24 hours Place 1 patch (21 mg total) onto the skin daily.   pantoprazole 40 MG tablet Commonly known as: PROTONIX Take 1 tablet (40 mg total) by mouth daily. NEEDS APPOINTMENT FOR FUTURE REFILLS   prazosin 2 MG capsule Commonly known as: MINIPRESS Take 1 capsule (2 mg total) by mouth daily. NEEDS APPOINTMENT FOR FUTURE REFILLS   thiamine 100 MG tablet Take 1 tablet (100 mg total) by mouth daily.   tizanidine 2 MG capsule Commonly known as: ZANAFLEX Take 4 mg by mouth 3 (three) times daily.   traZODone 100 MG tablet Commonly known as: DESYREL TAKE 1 TABLET BY MOUTH EVERYDAY AT BEDTIME        DISCHARGE INSTRUCTIONS:  See AVS.  If you experience worsening of your admission symptoms, develop shortness of breath, life threatening emergency, suicidal or homicidal thoughts you must seek medical attention immediately by calling 911 or calling your MD immediately  if symptoms less severe.  You Must read complete instructions/literature along with all the possible adverse reactions/side effects for all the Medicines you take and that have been prescribed to you. Take any new Medicines after you have completely understood and accpet all the possible adverse reactions/side effects.   Please note  You were cared for by a hospitalist during your hospital stay. If you have any questions about your discharge medications or the care you received while you were in the hospital after you are discharged, you can call the unit and asked to speak with the hospitalist on call if the hospitalist that took care of you is not available. Once you are discharged, your primary care physician will handle any further medical issues. Please note that NO REFILLS for any discharge medications will be authorized once you are discharged, as it is imperative  that you return to your primary care physician (or establish a relationship with a primary care physician if you do not have one) for your aftercare needs so that they can reassess your need for medications and monitor your lab values.    On the day of Discharge:  VITAL SIGNS:  Blood pressure 111/67, pulse (!) 53, temperature 98.2 F (36.8 C), temperature source Oral, resp. rate 16, height 5\' 3"  (1.6 m), weight 82 kg, SpO2 94 %. PHYSICAL EXAMINATION:  GENERAL:  68 y.o.-year-old patient lying in the bed with no acute distress.  EYES: Pupils equal, round, reactive to light and accommodation. No scleral icterus. Extraocular muscles intact.  HEENT: Head atraumatic, normocephalic. Oropharynx and nasopharynx clear.  NECK:  Supple, no jugular venous distention. No thyroid enlargement, no tenderness.  LUNGS: Normal breath sounds bilaterally, no wheezing, rales,rhonchi or crepitation. No use of accessory muscles of respiration.  CARDIOVASCULAR: S1, S2 normal. No murmurs, rubs, or gallops.  ABDOMEN: Soft, non-tender, non-distended. Bowel sounds present. No organomegaly or mass.  EXTREMITIES: No pedal edema, cyanosis, or clubbing.  NEUROLOGIC: Cranial nerves II through XII are intact. Muscle strength 5/5 in all  extremities. Sensation intact. Gait not checked.  PSYCHIATRIC: The patient is alert and oriented x 3.  SKIN: No obvious rash, lesion, or ulcer.  DATA REVIEW:   CBC Recent Labs  Lab 01/27/19 0616  WBC 9.1  HGB 11.6*  HCT 35.1*  PLT 179    Chemistries  Recent Labs  Lab 01/27/19 0616 01/28/19 0739  NA 139 141  K 3.8 3.6  CL 101 103  CO2 30 29  GLUCOSE 112* 101*  BUN 45* 35*  CREATININE 1.33* 1.15*  CALCIUM 8.8* 8.9  MG 2.4  --   AST 16  --   ALT 12  --   ALKPHOS 31*  --   BILITOT 0.7  --      Microbiology Results  Results for orders placed or performed during the hospital encounter of 01/26/19  SARS Coronavirus 2 [facility] order, Performed in [facility] hospital lab)  Nasopharyngeal Nasopharyngeal Swab     Status: None   Collection Time: 01/26/19  7:27 AM   Specimen: Nasopharyngeal Swab  Result Value Ref Range Status   SARS Coronavirus 2 NEGATIVE NEGATIVE Final    Comment: (NOTE) If result is NEGATIVE SARS-CoV-2 target nucleic acids are NOT DETECTED. The SARS-CoV-2 RNA is generally detectable in upper and lower  respiratory specimens during the acute phase of infection. The lowest  concentration of SARS-CoV-2 viral copies this assay can detect is 250  copies / mL. A negative result does not preclude SARS-CoV-2 infection  and should not be used as the sole basis for treatment or other  patient management decisions.  A negative result may occur with  improper specimen collection / handling, submission of specimen other  than nasopharyngeal swab, presence of viral mutation(s) within the  areas targeted by this assay, and inadequate number of viral copies  (<250 copies / mL). A negative result must be combined with clinical  observations, patient history, and epidemiological information. If result is POSITIVE SARS-CoV-2 target nucleic acids are DETECTED. The SARS-CoV-2 RNA is generally detectable in upper and lower  respiratory specimens dur ing the acute phase of infection.  Positive  results are indicative of active infection with SARS-CoV-2.  Clinical  correlation with patient history and other diagnostic information is  necessary to determine patient infection status.  Positive results do  not rule out bacterial infection or co-infection with other viruses. If result is PRESUMPTIVE POSTIVE SARS-CoV-2 nucleic acids MAY BE PRESENT.   A presumptive positive result was obtained on the submitted specimen  and confirmed on repeat testing.  While 2019 novel coronavirus  (SARS-CoV-2) nucleic acids may be present in the submitted sample  additional confirmatory testing may be necessary for epidemiological  and / or clinical management purposes  to  differentiate between  SARS-CoV-2 and other Sarbecovirus currently known to infect humans.  If clinically indicated additional testing with an alternate test  methodology [phone]) is advised. The SARS-CoV-2 RNA is generally  detectable in upper and lower respiratory sp ecimens during the acute  phase of infection. The expected result is Negative. Fact Sheet for Patients:  StrictlyIdeas.no Fact Sheet for Healthcare Providers: BankingDealers.co.za This test is not yet approved or cleared by the [place] FDA and has been authorized for detection and/or diagnosis of SARS-CoV-2 by FDA under an Emergency Use Authorization (EUA).  This EUA will remain in effect (meaning this test can be used) for the duration of the COVID-19 declaration under Section 564(b)(1) of the Act, 21 U.S.C. section 360bbb-3(b)(1), unless the authorization is terminated or  revoked sooner. Performed at [facility], [address] [place], [place] [address]   CULTURE, BLOOD (ROUTINE X 2) w Reflex to ID Panel     Status: None (Preliminary result)   Collection Time: 01/26/19  7:45 AM   Specimen: BLOOD  Result Value Ref Range Status   Specimen Description BLOOD BLOOD LEFT FOREARM  Final   Special Requests   Final    BOTTLES DRAWN AEROBIC AND ANAEROBIC Blood Culture results may not be optimal due to an excessive volume of blood received in culture bottles   Culture   Final    NO GROWTH 2 DAYS Performed at [facility] [address], [place], [place] [address]    Report Status PENDING  Incomplete  Urine Culture     Status: Abnormal   Collection Time: 01/26/19  8:12 AM   Specimen: Urine, Clean Catch  Result Value Ref Range Status   Specimen Description   Final    URINE, CLEAN CATCH Performed at [facility], [address] [place], [place] [address]    Special Requests   Final    NONE Performed at [facility], [address], [place], [place] [address]    Culture MULTIPLE SPECIES PRESENT, SUGGEST RECOLLECTION (A)  Final   Report Status 01/27/2019 FINAL  Final  CULTURE, BLOOD (ROUTINE X 2) w Reflex to ID Panel     Status: None (Preliminary result)   Collection Time: 01/26/19 12:28 PM   Specimen: BLOOD  Result Value Ref Range Status   Specimen Description BLOOD BLOOD RIGHT ARM  Final   Special Requests   Final    BOTTLES DRAWN AEROBIC AND ANAEROBIC Blood Culture adequate volume   Culture   Final    NO GROWTH 2 DAYS Performed at [facility], [address], [place],  [address]    Report Status PENDING  Incomplete    RADIOLOGY:  No results found.   Management plans discussed with the patient, family and they are in agreement.  CODE STATUS: Full Code   TOTAL TIME TAKING CARE OF THIS PATIENT: 32 minutes.    Demetrios Loll M.D on 01/28/2019 at 10:15 AM  Between 7am to 6pm - Pager - [phone]  After 6pm go to [email or web address] - Proofreader  [facility]  Office  [phone]  CC: Primary care physician; Guadlupe Spanish, MD   Note: This dictation was prepared with Dragon dictation along with smaller phrase technology. Any transcriptional errors that result from this process are unintentional.

## 2019-01-28 NOTE — TOC Transition Note (Signed)
Transition of Care [facility]) - CM/SW Discharge Note   Patient Details  Name: Sarah Barnes MRN: [id number] Date of Birth: 1950/11/24  Transition [facility]) CM/SW Contact:  Shelbie Hutching, RN Phone Number: 01/28/2019, 12:32 PM   Clinical Narrative:    Patient is ready for discharge today.  Patient will discharge home, her sister is picking her up.  [facility] has agreed to accept patient for home health PT- but reports they cannot start until Monday 9/14.     Final next level of care: [place] Barriers to Discharge: Barriers Resolved   Patient Goals and CMS Choice Patient states their goals for this hospitalization and ongoing recovery are:: just to get home and see her dog CMS Medicare.gov Compare Post Acute Care list provided to:: Patient Choice offered to / list presented to : Patient  Discharge Placement                       Discharge Plan and Services   Discharge Planning Services: CM Consult Post Acute Care Choice: Home Health                    HH Arranged: PT [place]: [place] Date Real: 01/28/19 Time HH Agency Contacted: 0930 Representative spoke with at [place]: Sharmon Revere  Social Determinants of Health ([place]) Interventions     Readmission Risk Interventions No flowsheet data found.

## 2019-01-31 LAB — CULTURE, BLOOD (ROUTINE X 2)
Culture: NO GROWTH
Culture: NO GROWTH
Special Requests: ADEQUATE

## 2019-08-09 ENCOUNTER — Ambulatory Visit: Payer: Medicare Other | Attending: Internal Medicine

## 2019-08-09 DIAGNOSIS — Z23 Encounter for immunization: Secondary | ICD-10-CM

## 2019-08-09 NOTE — Progress Notes (Signed)
   Covid-19 Vaccination Clinic  Name:  Sarah Barnes    MRN: [id number] DOB: 03-09-1951  08/09/2019  [name] was observed post Covid-19 immunization for 15 minutes without incident. She was provided with Vaccine Information Sheet and instruction to access the V-Safe system.   [name] was instructed to call 911 with any severe reactions post vaccine: Marland Kitchen Difficulty breathing  . Swelling of face and throat  . A fast heartbeat  . A bad rash all over body  . Dizziness and weakness   Immunizations Administered    Name Date Dose VIS Date Route   Pfizer COVID-19 Vaccine 08/09/2019 10:52 AM 0.3 mL 05/01/2019 Intramuscular   Manufacturer: [place]   Lot: [id number]   [place]: [id number]    .co

## 2019-08-30 ENCOUNTER — Ambulatory Visit: Payer: Medicare Other | Attending: Internal Medicine

## 2019-08-30 DIAGNOSIS — Z23 Encounter for immunization: Secondary | ICD-10-CM

## 2019-08-30 NOTE — Progress Notes (Signed)
   Covid-19 Vaccination Clinic  Name:  Sarah Barnes    MRN: [id number] DOB: [birth year]  08/30/2019  Sarah Barnes was observed post Covid-19 immunization for 15 minutes without incident. She was provided with Vaccine Information Sheet and instruction to access the V-Safe system.   Sarah Barnes was instructed to call 911 with any severe reactions post vaccine: Marland Kitchen Difficulty breathing  . Swelling of face and throat  . A fast heartbeat  . A bad rash all over body  . Dizziness and weakness   Immunizations Administered    Name Date Dose VIS Date Route   Pfizer COVID-19 Vaccine 08/30/2019  9:14 AM 0.3 mL 05/01/2019 Intramuscular   Manufacturer: [place]   Lot: [id number]   [place]: [id number]

## 2019-11-20 ENCOUNTER — Other Ambulatory Visit: Payer: Self-pay

## 2019-11-20 ENCOUNTER — Encounter: Payer: Self-pay | Admitting: Nurse Practitioner

## 2019-11-20 ENCOUNTER — Other Ambulatory Visit: Payer: Self-pay | Admitting: Nurse Practitioner

## 2019-11-20 ENCOUNTER — Ambulatory Visit (INDEPENDENT_AMBULATORY_CARE_PROVIDER_SITE_OTHER): Payer: Medicare Other | Admitting: Nurse Practitioner

## 2019-11-20 VITALS — BP 127/65 | HR 57 | Temp 97.6°F | Resp 16 | Ht 63.0 in | Wt 164.2 lb

## 2019-11-20 DIAGNOSIS — M064 Inflammatory polyarthropathy: Secondary | ICD-10-CM

## 2019-11-20 DIAGNOSIS — I350 Nonrheumatic aortic (valve) stenosis: Secondary | ICD-10-CM | POA: Diagnosis not present

## 2019-11-20 DIAGNOSIS — M79604 Pain in right leg: Secondary | ICD-10-CM | POA: Diagnosis not present

## 2019-11-20 DIAGNOSIS — R1084 Generalized abdominal pain: Secondary | ICD-10-CM

## 2019-11-20 DIAGNOSIS — I10 Essential (primary) hypertension: Secondary | ICD-10-CM | POA: Diagnosis not present

## 2019-11-20 DIAGNOSIS — F411 Generalized anxiety disorder: Secondary | ICD-10-CM

## 2019-11-20 DIAGNOSIS — R531 Weakness: Secondary | ICD-10-CM

## 2019-11-20 DIAGNOSIS — M79605 Pain in left leg: Secondary | ICD-10-CM

## 2019-11-20 MED ORDER — CELECOXIB 200 MG PO CAPS: 200.0000 mg | ORAL_CAPSULE | Freq: Every day | ORAL | 1 refills | Status: DC

## 2019-11-20 MED ORDER — TIZANIDINE HCL 2 MG PO TABS: 2.0000 mg | ORAL_TABLET | Freq: Three times a day (TID) | ORAL | 0 refills | Status: DC | PRN

## 2019-11-20 MED ORDER — BUSPIRONE HCL 15 MG PO TABS: 15.0000 mg | ORAL_TABLET | Freq: Two times a day (BID) | ORAL | 1 refills | Status: DC

## 2019-11-20 MED ORDER — ESCITALOPRAM OXALATE 10 MG PO TABS: 10.0000 mg | ORAL_TABLET | Freq: Every day | ORAL | 1 refills | Status: DC

## 2019-11-20 NOTE — Progress Notes (Signed)
[facility] [place], [place] [address]  Internal MEDICINE  Office Visit Note  Patient Name: Sarah Barnes  [id number]  [id number]  Date of Service: 11/28/2019   Complaints/HPI Pt is here for establishment of PCP. Chief Complaint  Patient presents with   New Patient (Initial Visit)    panic attacks   Depression    constantly   Gastroesophageal Reflux   Hyperlipidemia   Hypertension   Abdominal Pain   The patient is here for establish primary care provider. She has multiple complaints and has not had a regular primary care provider in some time. She was hospitalized due to weakness in 01/2019 and she has not had a provider to follow up with since that hospitalization. She c/o pain in both legs. She also has shortness of breath, worse with exertion. She is a regular smoker. During her hospitalization, she did have echocardiogram. She had normal left and right ventricles with normal systolic and diastolic function. She had mild thickening of the mitral valve. There was moderate thickening of the aortic valve with mild aortic calcification and stenosis of the aortic valve. The patient has not seen cardiology since her release from hospital in 01/2019.  The patient states that she is having problems with chronic abdominal pain. Pain is nearly constant. She cannot think of anything which make th pain worse or better.  She also has anxiety and depression. She has been on medication for this in the past, but has been off of this medication for some time. She states that she cries frequently and she is often irritable for no good reason.     Current Medication: Outpatient Encounter Medications as of 11/20/2019  Medication Sig   albuterol (VENTOLIN HFA) 108 (90 Base) MCG/ACT inhaler Inhale 2 puffs into the lungs every 6 (six) hours as needed for wheezing.   aspirin EC 81 MG EC tablet Take 1 tablet (81 mg total) by mouth daily.   busPIRone (BUSPAR) 15 MG tablet  Take 1 tablet (15 mg total) by mouth 2 (two) times daily.   carvedilol (COREG) 25 MG tablet Take 25 mg by mouth 2 (two) times daily.   celecoxib (CELEBREX) 200 MG capsule Take 1 capsule (200 mg total) by mouth daily.   escitalopram (LEXAPRO) 10 MG tablet Take 1 tablet (10 mg total) by mouth daily.   fenofibrate micronized (LOFIBRA) 134 MG capsule Take 1 capsule (134 mg total) by mouth daily before breakfast. NEEDS APPOINTMENT FOR FUTURE REFILLS   folic acid (FOLVITE) 1 MG tablet Take 1 tablet (1 mg total) by mouth daily.   furosemide (LASIX) 40 MG tablet Take 40 mg by mouth daily.   hydrochlorothiazide (HYDRODIURIL) 25 MG tablet Take 25 mg by mouth daily.   Multiple Vitamin (MULTIVITAMIN WITH MINERALS) TABS tablet Take 1 tablet by mouth daily.   nicotine (NICODERM CQ - DOSED IN MG/24 HOURS) 21 mg/24hr patch Place 1 patch (21 mg total) onto the skin daily.   pantoprazole (PROTONIX) 40 MG tablet Take 1 tablet (40 mg total) by mouth daily. NEEDS APPOINTMENT FOR FUTURE REFILLS   prazosin (MINIPRESS) 2 MG capsule Take 1 capsule (2 mg total) by mouth daily. NEEDS APPOINTMENT FOR FUTURE REFILLS   thiamine 100 MG tablet Take 1 tablet (100 mg total) by mouth daily.   traZODone (DESYREL) 100 MG tablet TAKE 1 TABLET BY MOUTH EVERYDAY AT BEDTIME   [DISCONTINUED] busPIRone (BUSPAR) 15 MG tablet Take 1 tablet (15 mg total) by mouth 2 (two) times daily.   [  DISCONTINUED] celecoxib (CELEBREX) 200 MG capsule Take 200 mg by mouth daily.   [DISCONTINUED] escitalopram (LEXAPRO) 10 MG tablet Take 10 mg by mouth daily.   [DISCONTINUED] tizanidine (ZANAFLEX) 2 MG capsule Take 4 mg by mouth 3 (three) times daily.   tiZANidine (ZANAFLEX) 2 MG tablet Take 1 tablet (2 mg total) by mouth every 8 (eight) hours as needed for muscle spasms.   No facility-administered encounter medications on file as of 11/20/2019.    Surgical History: Past Surgical History:  Procedure Laterality Date   ANAL FISTULECTOMY   1970s X 3   BACK SURGERY     HERNIA REPAIR     KNEE ARTHROSCOPY Right    LAPAROSCOPIC CHOLECYSTECTOMY     LAPAROSCOPIC INCISIONAL / UMBILICAL / VENTRAL HERNIA REPAIR  08/13/2016   VHR w/mesh   LUMBAR DISC SURGERY     "herniated disc"   NASAL FRACTURE SURGERY  1970s X 2   RIGHT/LEFT HEART CATH AND CORONARY ANGIOGRAPHY N/A 12/27/2016   Procedure: RIGHT/LEFT HEART CATH AND CORONARY ANGIOGRAPHY;  Surgeon: Leonie Man, MD;  Location: [facility] INVASIVE CV LAB: Mild-moderate pulmonary hypertension.  Hyperdynamic ventricle noted.  EF 55-65% -hyperdynamic (unable to measure LVOT gradient).  Mildly elevated very tortuous but angiographically normal coronary arteries, suggesting hypertensive heart disease   SHOULDER ARTHROSCOPY WITH ROTATOR CUFF REPAIR Right 1990s?   TONSILLECTOMY  1951   TRANSTHORACIC ECHOCARDIOGRAM  12/2016    EF 60-65% with dynamic outflow tract obstruction at rest. Peak gradient 52 mmHg consistent with HOCM.  GRII DD area. Aortic sclerosis but no stenosis. Systolic anterior motion of mitral valve chordae. Mild mitral stenosis. Moderate LA dilation and mild RA dilation. Peak PA pressures 35 mmHg.   VENTRAL HERNIA REPAIR N/A 08/13/2016   Procedure: LAPAROSCOPIC VENTRAL HERNIA REPAIR WITH MESH;  Surgeon: Judeth Horn, MD;  Location: [place];  Service: General;  Laterality: N/A;    Medical History: Past Medical History:  Diagnosis Date   Agoraphobia with panic disorder    Anemia    Arthritis    "all over" (08/13/2016)   Chronic bronchitis (HCC)    COPD (chronic obstructive pulmonary disease) (HCC)    Depression    Dyspnea    occ   Dysrhythmia    palpitations occ due to anxiety   ETOH abuse    GAD (generalized anxiety disorder)    GERD (gastroesophageal reflux disease)    Headache    "daily til I got glasses; now have headache once in awhile" (08/13/2016)   Hepatitis 1960   "isolated &  hospitalized for 2 weeks; don't know which kind of hepatitis"     Hernia, hiatal    High cholesterol    Hypertension    Hypertrophic obstructive cardiomyopathy ([place])    Mild aortic stenosis    Pneumonia    "once or twice" (08/13/2016)   PONV (postoperative nausea and vomiting)    Sciatica     Family History: Family History  Problem Relation Age of Onset   Diabetes Mother    Hyperlipidemia Mother    Hypertension Mother    Heart attack Mother 34   Angina Mother        chronic problem   Hypertension Father    Stroke Father    Cancer Brother     Social History   Socioeconomic History   Marital status: Single    Spouse name: Not on file   Number of children: Not on file   Years of education: Not on file   Highest  education level: Not on file  Occupational History   Not on file  Tobacco Use   Smoking status: Current Every Day Smoker    Packs/day: 1.50    Years: 49.00    Pack years: 73.50    Types: Cigarettes   Smokeless tobacco: Never Used  Substance and Sexual Activity   Alcohol use: Yes    Alcohol/week: 84.0 standard drinks    Types: 36 Cans of beer, 48 Shots of liquor per week    Comment: occ   Drug use: Not Currently    Frequency: 1.0 times per week    Types: Marijuana, Cocaine    Comment: Cocaine + March 2017   Sexual activity: Not Currently  Other Topics Concern   Not on file  Social History Narrative   Ghada has generalized anxiety disorder and clearcut Agoraphobia.   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    She currently lives with her sister.   Currently denies substance abuse beyond Tobacco (1-1/2 PPD) & EtOH (beer, vodka) - not ready to discuss quitting.   Social Determinants of Health   Financial Resource Strain:    Difficulty of Paying Living Expenses:   Food Insecurity:    Worried About Charity fundraiser in the Last Year:    Arboriculturist in the Last Year:   Transportation  Needs:    Film/video editor (Medical):    Lack of Transportation (Non-Medical):   Physical Activity:    Days of Exercise per Week:    Minutes of Exercise per Session:   Stress:    Feeling of Stress :   Social Connections:    Frequency of Communication with Friends and Family:    Frequency of Social Gatherings with Friends and Family:    Attends Religious Services:    Active Member of Clubs or Organizations:    Attends Music therapist:    Marital Status:   Intimate Partner Violence:    Fear of Current or Ex-Partner:    Emotionally Abused:    Physically Abused:    Sexually Abused:      Review of Systems  Constitutional: Positive for fatigue. Negative for chills and unexpected weight change.  HENT: Negative for congestion, postnasal drip, rhinorrhea, sneezing and sore throat.   Respiratory: Positive for shortness of breath and wheezing. Negative for cough and chest tightness.   Cardiovascular: Negative for chest pain and palpitations.       Bilateral leg pain   Gastrointestinal: Positive for abdominal pain. Negative for constipation, diarrhea, nausea and vomiting.  Endocrine: Negative for cold intolerance, heat intolerance, polydipsia and polyuria.  Musculoskeletal: Positive for arthralgias and myalgias. Negative for back pain, joint swelling and neck pain.       Generalized joint pain.  Skin: Negative for rash.  Neurological: Negative for dizziness, tremors, numbness and headaches.  Hematological: Negative for adenopathy. Does not bruise/bleed easily.  Psychiatric/Behavioral: Positive for dysphoric mood. Negative for behavioral problems (Depression), sleep disturbance and suicidal ideas. The patient is nervous/anxious.     Today's Vitals   11/20/19 1109  BP: 127/65  Pulse: (!) 57  Resp: 16  Temp: 97.6 F (36.4 C)  SpO2: 97%  Weight: 164 lb 3.2 oz (74.5 kg)  Height: 5\' 3"  (1.6 m)   Body mass index is 29.09 kg/m.  Physical  Exam Vitals and nursing note reviewed.  Constitutional:      General: She is not in acute distress.    Appearance: Normal appearance. She is well-developed. She is not diaphoretic.  HENT:     Head: Normocephalic and atraumatic.     Nose: Nose normal.     Mouth/Throat:     Pharynx: No oropharyngeal exudate.  Eyes:     Pupils: Pupils are equal, round, and reactive to light.  Neck:     Thyroid: No thyromegaly.     Vascular: No carotid bruit or JVD.     Trachea: No tracheal deviation.  Cardiovascular:     Rate and Rhythm: Normal rate and regular rhythm.     Heart sounds: Murmur heard.  No friction rub. No gallop.   Pulmonary:     Effort: Pulmonary effort is normal. No respiratory distress.     Breath sounds: No wheezing or rales.     Comments: Diminished breath sounds throughout to breath sounds.  Chest:     Chest wall: No tenderness.  Abdominal:     General: Bowel sounds are normal.     Palpations: Abdomen is soft.     Tenderness: There is abdominal tenderness.     Comments: There is generalized abdominal tenderness with palpation.   Musculoskeletal:        General: Normal range of motion.     Cervical back: Normal range of motion and neck supple.     Comments: The patient has generalized joint pain without point tenderness present.   Lymphadenopathy:     Cervical: No cervical adenopathy.  Skin:    General: Skin is warm and dry.  Neurological:     General: No focal deficit present.     Mental Status: She is alert and oriented to person, place, and time.     Cranial Nerves: No cranial nerve deficit.  Psychiatric:        Attention and Perception: Attention and perception normal.        Mood and Affect: Mood is anxious and depressed.        Speech: Speech normal.        Behavior: Behavior normal. Behavior is cooperative.        Thought Content: Thought content normal.        Cognition and Memory: Cognition and memory normal.        Judgment: Judgment normal.    Assessment/Plan: 1. Essential hypertension Blood pressure stable. Continue all blood pressure medication as prescribed   2. Aortic valve stenosis, etiology of cardiac valve disease unspecified Reviewed results of echocardiogram while patient hospitalized 01/2019. This echo showed moderate thickening of the aortic valve mith mild calcification and stenosis of the aortic valve. Will get new echocardiogram for further evaluation and refer to cardiology as indicated.  - ECHOCARDIOGRAM COMPLETE; Future  3. Bilateral leg pain Will get venous doppler study of bilateral lower extremities.  - DOPPLER VENOUS LEGS BILATERAL; Future  4. Generalized abdominal pain Will get ultrasound of the abdomen with further evaluation. - US Abdomen Complete; Future  5. Generalized weakness Check routine labs for further evaluation.   6. Inflammatory polyarthropathy (HCC) Start celebrex 200mg  daily to reduce pain and inflammation. May take tizanidine 2mg  up to three times daily as needed for muscle pain/spasms.  - celecoxib (CELEBREX) 200 MG capsule; Take 1 capsule (200 mg total) by mouth daily.  Dispense: 90 capsule; Refill: 1 - tiZANidine (ZANAFLEX) 2 MG tablet; Take 1 tablet (2 mg total) by mouth every 8 (eight) hours as needed for  muscle spasms.  Dispense: 270 tablet; Refill: 0  7. Generalized anxiety disorder Start lexapro 10mg  daily. May take buspirone 15mg  up to twice daily as needed for acute anxiety.  - busPIRone (BUSPAR) 15 MG tablet; Take 1 tablet (15 mg total) by mouth 2 (two) times daily.  Dispense: 180 tablet; Refill: 1 - escitalopram (LEXAPRO) 10 MG tablet; Take 1 tablet (10 mg total) by mouth daily.  Dispense: 90 tablet; Refill: 1  General Counseling: Tristy verbalizes understanding of the findings of todays visit and agrees with plan of treatment. I have discussed any further diagnostic evaluation that may be needed or ordered today. We also reviewed her medications today. she has been  encouraged to call the office with any questions or concerns that should arise related to todays visit.    Counseling:  This patient was seen by Leretha Pol FNP Collaboration with Dr [name] as a part of collaborative care agreement  Orders Placed This Encounter  Procedures   US Abdomen Complete   ECHOCARDIOGRAM COMPLETE   DOPPLER VENOUS LEGS BILATERAL    Meds ordered this encounter  Medications   busPIRone (BUSPAR) 15 MG tablet    Sig: Take 1 tablet (15 mg total) by mouth 2 (two) times daily.    Dispense:  180 tablet    Refill:  1    Order Specific Question:   Supervising Provider    Answer:   [name] [[id number]]   celecoxib (CELEBREX) 200 MG capsule    Sig: Take 1 capsule (200 mg total) by mouth daily.    Dispense:  90 capsule    Refill:  1    Order Specific Question:   Supervising Provider    Answer:   [name] [[id number]]   escitalopram (LEXAPRO) 10 MG tablet    Sig: Take 1 tablet (10 mg total) by mouth daily.    Dispense:  90 tablet    Refill:  1    Order Specific Question:   Supervising Provider    Answer:   [name] [[id number]]   tiZANidine (ZANAFLEX) 2 MG tablet    Sig: Take 1 tablet (2 mg total) by mouth every 8 (eight) hours as needed for muscle spasms.    Dispense:  270 tablet    Refill:  0    Order Specific Question:   Supervising Provider    Answer:   [name] [[id number]]    Time spent:45 Minutes

## 2019-11-21 LAB — CBC
Hematocrit: 40.1 % (ref 34.0–46.6)
Hemoglobin: 14 g/dL (ref 11.1–15.9)
MCH: 31.8 pg (ref 26.6–33.0)
MCHC: 34.9 g/dL (ref 31.5–35.7)
MCV: 91 fL (ref 79–97)
Platelets: 310 10*3/uL (ref 150–450)
RBC: 4.4 x10E6/uL (ref 3.77–5.28)
RDW: 12.1 % (ref 11.7–15.4)
WBC: 9.6 10*3/uL (ref 3.4–10.8)

## 2019-11-21 LAB — COMPREHENSIVE METABOLIC PANEL
ALT: 13 IU/L (ref 0–32)
AST: 15 IU/L (ref 0–40)
Albumin/Globulin Ratio: 1.8 (ref 1.2–2.2)
Albumin: 4.7 g/dL (ref 3.7–4.7)
Alkaline Phosphatase: 52 IU/L (ref 48–121)
BUN/Creatinine Ratio: 20 (ref 12–28)
BUN: 33 mg/dL — ABNORMAL HIGH (ref 8–27)
Bilirubin Total: 0.6 mg/dL (ref 0.0–1.2)
CO2: 30 mmol/L — ABNORMAL HIGH (ref 20–29)
Calcium: 10 mg/dL (ref 8.7–10.3)
Chloride: 96 mmol/L (ref 96–106)
Creatinine, Ser: 1.68 mg/dL — ABNORMAL HIGH (ref 0.57–1.00)
GFR calc Af Amer: 35 mL/min/{1.73_m2} — ABNORMAL LOW (ref >59)
GFR calc non Af Amer: 30 mL/min/{1.73_m2} — ABNORMAL LOW (ref >59)
Globulin, Total: 2.6 g/dL (ref 1.5–4.5)
Glucose: 99 mg/dL (ref 65–99)
Potassium: 4 mmol/L (ref 3.5–5.2)
Sodium: 141 mmol/L (ref 134–144)
Total Protein: 7.3 g/dL (ref 6.0–8.5)

## 2019-11-21 LAB — ANA W/REFLEX IF POSITIVE: Anti Nuclear Antibody (ANA): NEGATIVE

## 2019-11-21 LAB — FERRITIN: Ferritin: 193 ng/mL — ABNORMAL HIGH (ref 15–150)

## 2019-11-21 LAB — HEPATITIS C ANTIBODY: Hep C Virus Ab: <0.1 s/co ratio (ref 0.0–0.9)

## 2019-11-21 LAB — T4, FREE: Free T4: 1.47 ng/dL (ref 0.82–1.77)

## 2019-11-21 LAB — IRON AND TIBC
Iron Saturation: 25 % (ref 15–55)
Iron: 101 ug/dL (ref 27–139)
Total Iron Binding Capacity: 399 ug/dL (ref 250–450)
UIBC: 298 ug/dL (ref 118–369)

## 2019-11-21 LAB — VITAMIN D 25 HYDROXY (VIT D DEFICIENCY, FRACTURES): Vit D, 25-Hydroxy: 18.9 ng/mL — ABNORMAL LOW (ref 30.0–100.0)

## 2019-11-21 LAB — LIPID PANEL WITH LDL/HDL RATIO
Cholesterol, Total: 204 mg/dL — ABNORMAL HIGH (ref 100–199)
HDL: 47 mg/dL (ref >39)
LDL Chol Calc (NIH): 123 mg/dL — ABNORMAL HIGH (ref 0–99)
LDL/HDL Ratio: 2.6 ratio (ref 0.0–3.2)
Triglycerides: 194 mg/dL — ABNORMAL HIGH (ref 0–149)
VLDL Cholesterol Cal: 34 mg/dL (ref 5–40)

## 2019-11-21 LAB — TSH: TSH: 1.47 u[IU]/mL (ref 0.450–4.500)

## 2019-11-21 LAB — B12 AND FOLATE PANEL
Folate: >20 ng/mL (ref >3.0)
Vitamin B-12: 452 pg/mL (ref 232–1245)

## 2019-11-21 LAB — RHEUMATOID FACTOR: Rheumatoid fact SerPl-aCnc: <10 IU/mL (ref 0.0–13.9)

## 2019-11-21 LAB — SEDIMENTATION RATE: Sed Rate: 14 mm/hr (ref 0–40)

## 2019-11-28 DIAGNOSIS — R1084 Generalized abdominal pain: Secondary | ICD-10-CM | POA: Insufficient documentation

## 2019-11-28 DIAGNOSIS — M064 Inflammatory polyarthropathy: Secondary | ICD-10-CM | POA: Insufficient documentation

## 2019-11-28 DIAGNOSIS — M79604 Pain in right leg: Secondary | ICD-10-CM | POA: Insufficient documentation

## 2019-12-29 NOTE — Progress Notes (Signed)
Labs reviewed and will discuss with her next follow up

## 2020-01-01 ENCOUNTER — Other Ambulatory Visit: Payer: Self-pay

## 2020-01-01 ENCOUNTER — Ambulatory Visit: Payer: Medicaid Other

## 2020-01-01 DIAGNOSIS — I350 Nonrheumatic aortic (valve) stenosis: Secondary | ICD-10-CM

## 2020-01-06 ENCOUNTER — Telehealth: Payer: Self-pay

## 2020-01-06 NOTE — Telephone Encounter (Signed)
Confirmed [place] on 8/20

## 2020-01-07 ENCOUNTER — Ambulatory Visit: Payer: Medicaid Other | Admitting: Nurse Practitioner

## 2020-01-08 ENCOUNTER — Other Ambulatory Visit: Payer: Self-pay

## 2020-01-08 ENCOUNTER — Ambulatory Visit (INDEPENDENT_AMBULATORY_CARE_PROVIDER_SITE_OTHER): Payer: Medicare Other

## 2020-01-08 DIAGNOSIS — R1084 Generalized abdominal pain: Secondary | ICD-10-CM | POA: Diagnosis not present

## 2020-01-08 HISTORY — PX: TRANSTHORACIC ECHOCARDIOGRAM: SHX275

## 2020-01-12 NOTE — Progress Notes (Signed)
Reviewed. Discuss with patient at visit 01/22/2020

## 2020-01-12 NOTE — Progress Notes (Signed)
Abnormal. Discuss with patient at visit 01/22/2020

## 2020-01-20 ENCOUNTER — Telehealth: Payer: Self-pay

## 2020-01-20 NOTE — Telephone Encounter (Signed)
Confirmed and screened for 01-22-20 ov.

## 2020-01-22 ENCOUNTER — Encounter: Payer: Self-pay | Admitting: Nurse Practitioner

## 2020-01-22 ENCOUNTER — Other Ambulatory Visit: Payer: Self-pay

## 2020-01-22 ENCOUNTER — Ambulatory Visit (INDEPENDENT_AMBULATORY_CARE_PROVIDER_SITE_OTHER): Payer: Medicare Other | Admitting: Nurse Practitioner

## 2020-01-22 VITALS — BP 129/78 | HR 56 | Temp 98.2°F | Resp 16 | Ht 63.0 in | Wt 162.2 lb

## 2020-01-22 DIAGNOSIS — Z23 Encounter for immunization: Secondary | ICD-10-CM | POA: Diagnosis not present

## 2020-01-22 DIAGNOSIS — I350 Nonrheumatic aortic (valve) stenosis: Secondary | ICD-10-CM | POA: Diagnosis not present

## 2020-01-22 DIAGNOSIS — M79604 Pain in right leg: Secondary | ICD-10-CM

## 2020-01-22 DIAGNOSIS — M064 Inflammatory polyarthropathy: Secondary | ICD-10-CM

## 2020-01-22 DIAGNOSIS — N289 Disorder of kidney and ureter, unspecified: Secondary | ICD-10-CM

## 2020-01-22 DIAGNOSIS — I10 Essential (primary) hypertension: Secondary | ICD-10-CM | POA: Diagnosis not present

## 2020-01-22 DIAGNOSIS — R300 Dysuria: Secondary | ICD-10-CM

## 2020-01-22 DIAGNOSIS — E559 Vitamin D deficiency, unspecified: Secondary | ICD-10-CM

## 2020-01-22 DIAGNOSIS — M79605 Pain in left leg: Secondary | ICD-10-CM

## 2020-01-22 DIAGNOSIS — Z0001 Encounter for general adult medical examination with abnormal findings: Secondary | ICD-10-CM

## 2020-01-22 MED ORDER — GABAPENTIN 100 MG PO CAPS: ORAL_CAPSULE | ORAL | 1 refills | Status: DC

## 2020-01-22 MED ORDER — TIZANIDINE HCL 2 MG PO TABS: 2.0000 mg | ORAL_TABLET | Freq: Three times a day (TID) | ORAL | 0 refills | Status: DC | PRN

## 2020-01-22 MED ORDER — ERGOCALCIFEROL 1.25 MG (50000 UT) PO CAPS: 50000.0000 [IU] | ORAL_CAPSULE | ORAL | 1 refills | Status: DC

## 2020-01-22 NOTE — Progress Notes (Signed)
[facility] [place], [place] [address]  Internal MEDICINE  Office Visit Note  Patient Name: Sarah Barnes  [id number]  [id number]  Date of Service: 02/09/2020   Pt is here for routine health maintenance examination  Chief Complaint  Patient presents with  . Annual Exam    pt wakes up with pain in the left hip nearly everyday, meds arent helping  . Hypertension  . Hyperlipidemia  . Depression     The patient is here for health maintenance exam. Since her initial visit, she had routine, fasting labs drawn. She has low vitamin d. Her renal functions are abnormal, and lipid panel is elevated, especially the triglycerides. She has intolerance to statins, but is taking fenofibrate at $RemoveBefore'134mg'preEaDLvMolgH$  daily. Ferritin was slightly elevated. She had been having chronic abdominal tenderness. An ultrasound of the abdomen was done. There is mild dilation of the distal bile duct, but no other abnormalities were found. She has had her gallbladder removed. There were no other abnormalities noted. She had echocardiogram as well. She has normal LVEF with mild diastolic dysfunction. There is nodular aortic valve thickening with trace aortic sclerosis and aortic regurgitation. She has mild mitral regurgitation and trace tricuspid regurgitation. She is due to et flu shot today. Her blood pressure is well controlled. She does have chronic left hip pain. Current dose of celebrex and gabapentin are not really helping to control this pain. It is worse when she first gets up and gets moving and then gets more manageable afer she has been moving around.      Current Medication: Outpatient Encounter Medications as of 01/22/2020  Medication Sig  . aspirin EC 81 MG EC tablet Take 1 tablet (81 mg total) by mouth daily.  . busPIRone (BUSPAR) 15 MG tablet Take 1 tablet (15 mg total) by mouth 2 (two) times daily.  . carvedilol (COREG) 25 MG tablet Take 25 mg by mouth 2 (two) times daily.  Marland Kitchen  escitalopram (LEXAPRO) 10 MG tablet Take 1 tablet (10 mg total) by mouth daily.  . fenofibrate micronized (LOFIBRA) 134 MG capsule Take 1 capsule (134 mg total) by mouth daily before breakfast. NEEDS APPOINTMENT FOR FUTURE REFILLS  . folic acid (FOLVITE) 1 MG tablet Take 1 tablet (1 mg total) by mouth daily.  . furosemide (LASIX) 40 MG tablet Take 40 mg by mouth daily.  . hydrochlorothiazide (HYDRODIURIL) 25 MG tablet Take 25 mg by mouth daily.  . Multiple Vitamin (MULTIVITAMIN WITH MINERALS) TABS tablet Take 1 tablet by mouth daily.  Marland Kitchen thiamine 100 MG tablet Take 1 tablet (100 mg total) by mouth daily.  . traZODone (DESYREL) 100 MG tablet TAKE 1 TABLET BY MOUTH EVERYDAY AT BEDTIME  . [DISCONTINUED] pantoprazole (PROTONIX) 40 MG tablet Take 1 tablet (40 mg total) by mouth daily. NEEDS APPOINTMENT FOR FUTURE REFILLS  . albuterol (VENTOLIN HFA) 108 (90 Base) MCG/ACT inhaler Inhale 2 puffs into the lungs every 6 (six) hours as needed for wheezing. (Patient not taking: Reported on 01/22/2020)  . celecoxib (CELEBREX) 200 MG capsule Take 1 capsule (200 mg total) by mouth daily. (Patient not taking: Reported on 01/22/2020)  . ergocalciferol (DRISDOL) 1.25 MG (50000 UT) capsule Take 1 capsule (50,000 Units total) by mouth once a week.  . gabapentin (NEURONTIN) 100 MG capsule Start taking 1 capsule po QPM. May titrate dosing up to TID as needed and as tolerated .  Marland Kitchen nicotine (NICODERM CQ - DOSED IN MG/24 HOURS) 21 mg/24hr patch Place 1 patch (  21 mg total) onto the skin daily. (Patient not taking: Reported on 01/22/2020)  . prazosin (MINIPRESS) 2 MG capsule Take 1 capsule (2 mg total) by mouth daily. NEEDS APPOINTMENT FOR FUTURE REFILLS (Patient not taking: Reported on 01/22/2020)  . tiZANidine (ZANAFLEX) 2 MG tablet Take 1 tablet (2 mg total) by mouth every 8 (eight) hours as needed for muscle spasms.  . [DISCONTINUED] tiZANidine (ZANAFLEX) 2 MG tablet Take 1 tablet (2 mg total) by mouth every 8 (eight) hours as  needed for muscle spasms. (Patient not taking: Reported on 01/22/2020)   No facility-administered encounter medications on file as of 01/22/2020.    Surgical History: Past Surgical History:  Procedure Laterality Date  . ANAL FISTULECTOMY  1970s X 3  . BACK SURGERY    . HERNIA REPAIR    . KNEE ARTHROSCOPY Right   . LAPAROSCOPIC CHOLECYSTECTOMY    . LAPAROSCOPIC INCISIONAL / UMBILICAL / VENTRAL HERNIA REPAIR  08/13/2016   VHR w/mesh  . LUMBAR DISC SURGERY     "herniated disc"  . NASAL FRACTURE SURGERY  1970s X 2  . RIGHT/LEFT HEART CATH AND CORONARY ANGIOGRAPHY N/A 12/27/2016   Procedure: RIGHT/LEFT HEART CATH AND CORONARY ANGIOGRAPHY;  Surgeon: Leonie Man, MD;  Location: [facility] INVASIVE CV LAB: Mild-moderate pulmonary hypertension.  Hyperdynamic ventricle noted.  EF 55-65% -hyperdynamic (unable to measure LVOT gradient).  Mildly elevated very tortuous but angiographically normal coronary arteries, suggesting hypertensive heart disease  . SHOULDER ARTHROSCOPY WITH ROTATOR CUFF REPAIR Right 1990s?  . TONSILLECTOMY  1951  . TRANSTHORACIC ECHOCARDIOGRAM  12/2016    EF 60-65% with dynamic outflow tract obstruction at rest. Peak gradient 52 mmHg consistent with HOCM.  GRII DD area. Aortic sclerosis but no stenosis. Systolic anterior motion of mitral valve chordae. Mild mitral stenosis. Moderate LA dilation and mild RA dilation. Peak PA pressures 35 mmHg.  Marland Kitchen VENTRAL HERNIA REPAIR N/A 08/13/2016   Procedure: LAPAROSCOPIC VENTRAL HERNIA REPAIR WITH MESH;  Surgeon: Judeth Horn, MD;  Location: [facility];  Service: General;  Laterality: N/A;    Medical History: Past Medical History:  Diagnosis Date  . Agoraphobia with panic disorder   . Anemia   . Arthritis    "all over" (08/13/2016)  . Chronic bronchitis ([place])   . COPD (chronic obstructive pulmonary disease) ([place])   . Depression   . Dyspnea    occ  . Dysrhythmia    palpitations occ due to anxiety  . ETOH abuse   . GAD (generalized anxiety  disorder)   . GERD (gastroesophageal reflux disease)   . Headache    "daily til I got glasses; now have headache once in awhile" (08/13/2016)  . Hepatitis 1960   "isolated &  hospitalized for 2 weeks; don't know which kind of hepatitis"   . Hernia, hiatal   . High cholesterol   . Hypertension   . Hypertrophic obstructive cardiomyopathy ([place])   . Mild aortic stenosis   . Pneumonia    "once or twice" (08/13/2016)  . PONV (postoperative nausea and vomiting)   . Sciatica     Family History: Family History  Problem Relation Age of Onset  . Diabetes Mother   . Hyperlipidemia Mother   . Hypertension Mother   . Heart attack Mother 18  . Angina Mother        chronic problem  . Hypertension Father   . Stroke Father   . Cancer Brother       Review of Systems  Constitutional: Positive for fatigue.  Negative for activity change, chills and unexpected weight change.  HENT: Negative for congestion, postnasal drip, rhinorrhea, sneezing and sore throat.   Respiratory: Positive for shortness of breath and wheezing. Negative for cough and chest tightness.        With exertion.   Cardiovascular: Negative for chest pain and palpitations.  Gastrointestinal: Positive for abdominal pain. Negative for constipation, diarrhea, nausea and vomiting.       Patient reports improved abdominal tenderness.   Endocrine: Negative for cold intolerance, heat intolerance, polydipsia and polyuria.  Genitourinary: Negative for dysuria, frequency and urgency.  Musculoskeletal: Positive for arthralgias and myalgias. Negative for back pain, joint swelling and neck pain.       Left hip pain. This is worse when she first gets up and gets moving and gets better after she has been up and around for some time.   Skin: Negative for rash.  Neurological: Negative for dizziness, tremors, numbness and headaches.  Hematological: Negative for adenopathy. Does not bruise/bleed easily.  Psychiatric/Behavioral: Positive for  dysphoric mood. Negative for behavioral problems (Depression), sleep disturbance and suicidal ideas. The patient is nervous/anxious.        Well managed with current medication.      Today's Vitals   01/22/20 1039  BP: 129/78  Pulse: (!) 56  Resp: 16  Temp: 98.2 F (36.8 C)  SpO2: 95%  Weight: 162 lb 3.2 oz (73.6 kg)  Height: $Remove'5\' 3"'QnnKnMd$  (1.6 m)   Body mass index is 28.73 kg/m.  Physical Exam Vitals and nursing note reviewed.  Constitutional:      General: She is not in acute distress.    Appearance: Normal appearance. She is well-developed. She is not diaphoretic.  HENT:     Head: Normocephalic and atraumatic.     Nose: Nose normal.     Mouth/Throat:     Pharynx: No oropharyngeal exudate.  Eyes:     Pupils: Pupils are equal, round, and reactive to light.  Neck:     Thyroid: No thyromegaly.     Vascular: No carotid bruit or JVD.     Trachea: No tracheal deviation.  Cardiovascular:     Rate and Rhythm: Normal rate and regular rhythm.     Pulses: Normal pulses.     Heart sounds: Murmur heard.  No friction rub. No gallop.   Pulmonary:     Effort: Pulmonary effort is normal. No respiratory distress.     Breath sounds: No wheezing or rales.     Comments: Diminished breath sounds throughout to breath sounds.  Chest:     Chest wall: No tenderness.  Abdominal:     General: Bowel sounds are normal.     Palpations: Abdomen is soft.     Tenderness: There is abdominal tenderness.     Comments: There is generalized abdominal tenderness with palpation.  This has improved since her most recent visit.   Musculoskeletal:        General: Normal range of motion.     Cervical back: Normal range of motion and neck supple.     Comments: There is left hip tenderness which is worse with exertion. Finding a comfortable seated position due to the pain/tenderness. There are no visible or palpable abnormalities at this time.   Lymphadenopathy:     Cervical: No cervical adenopathy.  Skin:     General: Skin is warm and dry.  Neurological:     General: No focal deficit present.     Mental Status: She is alert  and oriented to person, place, and time.     Cranial Nerves: No cranial nerve deficit.  Psychiatric:        Attention and Perception: Attention and perception normal.        Mood and Affect: Mood is anxious and depressed.        Speech: Speech normal.        Behavior: Behavior normal. Behavior is cooperative.        Thought Content: Thought content normal.        Cognition and Memory: Cognition and memory normal.        Judgment: Judgment normal.    Depression screen [facility] 2/9 01/22/2020 02/23/2016 05/26/2015 05/05/2014  Decreased Interest 0 1 0 0  Down, Depressed, Hopeless 3 1 0 1  PHQ - 2 Score 3 2 0 1  Altered sleeping 0 0 - -  Tired, decreased energy 3 1 - -  Change in appetite 0 1 - -  Feeling bad or failure about yourself  0 - - -  Trouble concentrating 0 1 - -  Moving slowly or fidgety/restless 1 1 - -  Suicidal thoughts 0 0 - -  PHQ-9 Score 7 6 - -  Difficult doing work/chores - Somewhat difficult - -    Functional Status Survey: Is the patient deaf or have difficulty hearing?: No Does the patient have difficulty seeing, even when wearing glasses/contacts?: No Does the patient have difficulty concentrating, remembering, or making decisions?: No Does the patient have difficulty walking or climbing stairs?: Yes (due to hip pain) Does the patient have difficulty dressing or bathing?: No Does the patient have difficulty doing errands alone such as visiting a doctor's office or shopping?: No  MMSE - [place] Exam 02/09/2020  Orientation to time 5  Orientation to Place 5  Registration 3  Attention/ Calculation 5  Recall 3  Language- name 2 objects 2  Language- repeat 1  Language- follow 3 step command 3  Language- read & follow direction 1  Write a sentence 1  Copy design 1  Total score 30    Fall Risk  01/22/2020 01/22/2020 02/23/2016 05/26/2015  05/05/2014  Falls in the past year? 0 0 Yes No Yes  Number falls in past yr: - - 1 - 2 or more  Injury with Fall? - - Yes - -  Risk for fall due to : - - Impaired balance/gait - Other (Comment)  Follow up - - Follow up appointment - -      LABS: Recent Results (from the past 2160 hour(s))  Comprehensive metabolic panel     Status: Abnormal   Collection Time: 11/20/19  1:17 PM  Result Value Ref Range   Glucose 99 65 - 99 mg/dL   BUN 33 (H) 8 - 27 mg/dL   Creatinine, Ser 1.68 (H) 0.57 - 1.00 mg/dL   GFR calc non Af Amer 30 (L) >59 mL/min/1.73   GFR calc Af Amer 35 (L) >59 mL/min/1.73    Comment: **[facility] currently reports eGFR in compliance with the current**   recommendations of the [employer]. [facility] will   update reporting as new guidelines are published from the NKF-ASN   Task force.    BUN/Creatinine Ratio 20 12 - 28   Sodium 141 134 - 144 mmol/L   Potassium 4.0 3.5 - 5.2 mmol/L   Chloride 96 96 - 106 mmol/L   CO2 30 (H) 20 - 29 mmol/L   Calcium 10.0 8.7 - 10.3 mg/dL  Total Protein 7.3 6.0 - 8.5 g/dL   Albumin 4.7 3.7 - 4.7 g/dL   Globulin, Total 2.6 1.5 - 4.5 g/dL   Albumin/Globulin Ratio 1.8 1.2 - 2.2   Bilirubin Total 0.6 0.0 - 1.2 mg/dL   Alkaline Phosphatase 52 48 - 121 IU/L   AST 15 0 - 40 IU/L   ALT 13 0 - 32 IU/L  CBC     Status: None   Collection Time: 11/20/19  1:17 PM  Result Value Ref Range   WBC 9.6 3.4 - 10.8 x10E3/uL   RBC 4.40 3.77 - 5.28 x10E6/uL   Hemoglobin 14.0 11.1 - 15.9 g/dL   Hematocrit 40.1 34.0 - 46.6 %   MCV 91 79 - 97 fL   MCH 31.8 26.6 - 33.0 pg   MCHC 34.9 31 - 35 g/dL   RDW 12.1 11.7 - 15.4 %   Platelets 310 150 - 450 x10E3/uL  Lipid Panel With LDL/HDL Ratio     Status: Abnormal   Collection Time: 11/20/19  1:17 PM  Result Value Ref Range   Cholesterol, Total 204 (H) 100 - 199 mg/dL   Triglycerides 194 (H) 0 - 149 mg/dL   HDL 47 >39 mg/dL   VLDL Cholesterol Cal 34 5 - 40 mg/dL   LDL Chol Calc (NIH) 123 (H)  0 - 99 mg/dL   LDL/HDL Ratio 2.6 0.0 - 3.2 ratio    Comment:                                     LDL/HDL Ratio                                             Men  Women                               1/2 Avg.Risk  1.0    1.5                                   Avg.Risk  3.6    3.2                                2X Avg.Risk  6.2    5.0                                3X Avg.Risk  8.0    6.1   Iron and TIBC     Status: None   Collection Time: 11/20/19  1:17 PM  Result Value Ref Range   Total Iron Binding Capacity 399 250 - 450 ug/dL   UIBC 298 118 - 369 ug/dL   Iron 101 27 - 139 ug/dL   Iron Saturation 25 15 - 55 %  B12 and Folate Panel     Status: None   Collection Time: 11/20/19  1:17 PM  Result Value Ref Range   Vitamin B-12 452 232 - 1,245 pg/mL   Folate >20.0 >3.0 ng/mL    Comment: A serum folate concentration of less than 3.1 ng/mL is  considered to represent clinical deficiency.   T4, free     Status: None   Collection Time: 11/20/19  1:17 PM  Result Value Ref Range   Free T4 1.47 0.82 - 1.77 ng/dL  TSH     Status: None   Collection Time: 11/20/19  1:17 PM  Result Value Ref Range   TSH 1.470 0.450 - 4.500 uIU/mL  Rheumatoid factor     Status: None   Collection Time: 11/20/19  1:17 PM  Result Value Ref Range   Rhuematoid fact SerPl-aCnc <10.0 0.0 - 13.9 IU/mL  VITAMIN D 25 Hydroxy (Vit-D Deficiency, Fractures)     Status: Abnormal   Collection Time: 11/20/19  1:17 PM  Result Value Ref Range   Vit D, 25-Hydroxy 18.9 (L) 30.0 - 100.0 ng/mL    Comment: Vitamin D deficiency has been defined by the [place] and an Endocrine Society practice guideline as a level of serum 25-OH vitamin D less than 20 ng/mL (1,2). The Endocrine Society went on to further define vitamin D insufficiency as a level between 21 and 29 ng/mL (2). 1. IOM (Institute of Medicine). 2010. Dietary reference    intakes for calcium and D. [place]: The    [employer]. 2. Holick  MF, Binkley [place], Bischoff-Ferrari HA, et al.    Evaluation, treatment, and prevention of vitamin D    deficiency: an Endocrine Society clinical practice    guideline. JCEM. 2011 Jul; 96(7):1911-30.   Hepatitis C antibody     Status: None   Collection Time: 11/20/19  1:17 PM  Result Value Ref Range   Hep C Virus Ab <0.1 0.0 - 0.9 s/co ratio    Comment:                                   Negative:     < 0.8                              Indeterminate: 0.8 - 0.9                                   Positive:     > 0.9  The CDC recommends that a positive HCV antibody result  be followed up with a HCV Nucleic Acid Amplification  test (258527).   ANA w/Reflex if Positive     Status: None   Collection Time: 11/20/19  1:17 PM  Result Value Ref Range   Anti Nuclear Antibody (ANA) Negative Negative  Sedimentation rate     Status: None   Collection Time: 11/20/19  1:17 PM  Result Value Ref Range   Sed Rate 14 0 - 40 mm/hr  Ferritin     Status: Abnormal   Collection Time: 11/20/19  1:17 PM  Result Value Ref Range   Ferritin 193 (H) 15.0 - 150.0 ng/mL  UA/M w/rflx Culture, Routine     Status: Abnormal   Collection Time: 01/22/20 11:42 AM   Specimen: Urine   Urine  Result Value Ref Range   Specific Gravity, UA 1.015 1.005 - 1.030   pH, UA 7.0 5.0 - 7.5   Color, UA Yellow Yellow   Appearance Ur Clear Clear   Leukocytes,UA 1+ (A) Negative   Protein,UA Negative Negative/Trace   Glucose, UA Negative Negative  Ketones, UA Negative Negative   RBC, UA Negative Negative   Bilirubin, UA Negative Negative   Urobilinogen, Ur 0.2 0.2 - 1.0 mg/dL   Nitrite, UA Negative Negative   Microscopic Examination See below:     Comment: Microscopic was indicated and was performed.   Urinalysis Reflex Comment     Comment: This specimen has reflexed to a Urine Culture.  Microscopic Examination     Status: Abnormal   Collection Time: 01/22/20 11:42 AM   Urine  Result Value Ref Range   WBC, UA None seen 0 - 5  /hpf   RBC 3-10 (A) 0 - 2 /hpf   Epithelial Cells (non renal) 0-10 0 - 10 /hpf   Casts None seen None seen /lpf   Bacteria, UA Moderate (A) None seen/Few  Urine Culture, Reflex     Status: None   Collection Time: 01/22/20 11:42 AM   Urine  Result Value Ref Range   Urine Culture, Routine Final report    Organism ID, Bacteria Comment     Comment: Mixed urogenital flora Greater than 100,000 colony forming units per mL     Assessment/Plan: 1. Encounter for general adult medical examination with abnormal findings Annual health maintenance exam today.   2. Essential hypertension Stable. Continue bp medication as prescribed.   3. Aortic valve stenosis, etiology of cardiac valve disease unspecified Reviewed the patient's most recent echocardiogram. She has normal LVEF with mild diastolic dysfunction. There is nodular aortic valve thickening with trace aortic sclerosis and aortic regurgitation. She has mild mitral regurgitation and trace tricuspid regurgitation. Will continue to monitor.   4. Abnormal renal function finding Abdominal ultrasound showed normal kidney structure. Will get renal ultrasound to evaluate for renal artery stenosis.  - US Renal Artery Stenosis; Future  5. Vitamin D deficiency Start drisdol 50000 iu weekly.  - ergocalciferol (DRISDOL) 1.25 MG (50000 UT) capsule; Take 1 capsule (50,000 Units total) by mouth once a week.  Dispense: 12 capsule; Refill: 1  6. Inflammatory polyarthropathy ([place]) May take tizanidine 2mg  up to three times daily if needed for muscle pain/spasms.  - tiZANidine (ZANAFLEX) 2 MG tablet; Take 1 tablet (2 mg total) by mouth every 8 (eight) hours as needed for muscle spasms.  Dispense: 270 tablet; Refill: 0  7. Bilateral leg pain Restart gabapentin 100mg  capsules. Start with one capsule at night. May gradually increase to three times daily as needed and as tolerated.  - gabapentin (NEURONTIN) 100 MG capsule; Start taking 1 capsule po QPM. May  titrate dosing up to TID as needed and as tolerated .  Dispense: 270 capsule; Refill: 1  8. Flu vaccine need Flu vaccine administered in the office today.  - Flu Vaccine MDCK QUAD PF  9. Dysuria - UA/M w/rflx Culture, Routine  General Counseling: Nahal verbalizes understanding of the findings of todays visit and agrees with plan of treatment. I have discussed any further diagnostic evaluation that may be needed or ordered today. We also reviewed her medications today. she has been encouraged to call the office with any questions or concerns that should arise related to todays visit.    Counseling:  This patient was seen by Leretha Pol FNP Collaboration with Dr [name] as a part of collaborative care agreement  Orders Placed This Encounter  Procedures  . Microscopic Examination  . Urine Culture, Reflex  . US Renal Artery Stenosis  . Flu Vaccine MDCK QUAD PF  . UA/M w/rflx Culture, Routine    Meds ordered  this encounter  Medications  . ergocalciferol (DRISDOL) 1.25 MG (50000 UT) capsule    Sig: Take 1 capsule (50,000 Units total) by mouth once a week.    Dispense:  12 capsule    Refill:  1    Order Specific Question:   Supervising Provider    Answer:   [name] [[id number]]  . gabapentin (NEURONTIN) 100 MG capsule    Sig: Start taking 1 capsule po QPM. May titrate dosing up to TID as needed and as tolerated .    Dispense:  270 capsule    Refill:  1    Order Specific Question:   Supervising Provider    Answer:   [name] [[id number]]  . tiZANidine (ZANAFLEX) 2 MG tablet    Sig: Take 1 tablet (2 mg total) by mouth every 8 (eight) hours as needed for muscle spasms.    Dispense:  270 tablet    Refill:  0    Order Specific Question:   Supervising Provider    Answer:   [name] [[id number]]    Total time spent: 58 Minutes  Time spent includes review of chart, medications, test results, and follow up plan with the patient.     [name], MD  Internal  Medicine

## 2020-01-25 LAB — UA/M W/RFLX CULTURE, ROUTINE
Bilirubin, UA: NEGATIVE
Glucose, UA: NEGATIVE
Ketones, UA: NEGATIVE
Nitrite, UA: NEGATIVE
Protein,UA: NEGATIVE
RBC, UA: NEGATIVE
Specific Gravity, UA: 1.015 (ref 1.005–1.030)
Urobilinogen, Ur: 0.2 mg/dL (ref 0.2–1.0)
pH, UA: 7 (ref 5.0–7.5)

## 2020-01-25 LAB — MICROSCOPIC EXAMINATION
Casts: NONE SEEN /lpf
WBC, UA: NONE SEEN /hpf (ref 0–5)

## 2020-01-25 LAB — URINE CULTURE, REFLEX

## 2020-01-29 ENCOUNTER — Other Ambulatory Visit: Payer: Self-pay

## 2020-01-29 MED ORDER — PANTOPRAZOLE SODIUM 40 MG PO TBEC: 40.0000 mg | DELAYED_RELEASE_TABLET | Freq: Every day | ORAL | 0 refills | Status: DC

## 2020-02-02 ENCOUNTER — Other Ambulatory Visit: Payer: Self-pay

## 2020-02-02 MED ORDER — PANTOPRAZOLE SODIUM 40 MG PO TBEC: 40.0000 mg | DELAYED_RELEASE_TABLET | Freq: Every day | ORAL | 0 refills | Status: DC

## 2020-02-09 DIAGNOSIS — Z0001 Encounter for general adult medical examination with abnormal findings: Secondary | ICD-10-CM | POA: Insufficient documentation

## 2020-02-09 DIAGNOSIS — Z23 Encounter for immunization: Secondary | ICD-10-CM | POA: Insufficient documentation

## 2020-02-09 DIAGNOSIS — N289 Disorder of kidney and ureter, unspecified: Secondary | ICD-10-CM | POA: Insufficient documentation

## 2020-02-09 DIAGNOSIS — R300 Dysuria: Secondary | ICD-10-CM | POA: Insufficient documentation

## 2020-02-09 DIAGNOSIS — E559 Vitamin D deficiency, unspecified: Secondary | ICD-10-CM | POA: Insufficient documentation

## 2020-02-15 ENCOUNTER — Telehealth: Payer: Self-pay

## 2020-02-15 NOTE — Telephone Encounter (Signed)
Tried reaching pt several times to reschd ultrasound due to NPO and needs to be first thing in the am. Sarah Barnes

## 2020-02-19 ENCOUNTER — Other Ambulatory Visit: Payer: Medicare Other

## 2020-02-23 ENCOUNTER — Ambulatory Visit: Payer: Medicare Other | Admitting: Nurse Practitioner

## 2020-03-09 ENCOUNTER — Other Ambulatory Visit: Payer: Medicaid Other

## 2020-03-09 ENCOUNTER — Telehealth: Payer: Self-pay

## 2020-03-09 NOTE — Telephone Encounter (Signed)
Pt NS ultrasound appt after confirming it, billed $25 no show and I called to RS had LMOM to call back to [place] and F/U

## 2020-03-09 NOTE — Telephone Encounter (Signed)
Billed missed appt fee 03/09/20.

## 2020-03-15 ENCOUNTER — Ambulatory Visit: Payer: Medicaid Other | Admitting: Nurse Practitioner

## 2020-03-24 ENCOUNTER — Other Ambulatory Visit: Payer: Self-pay

## 2020-03-24 DIAGNOSIS — F411 Generalized anxiety disorder: Secondary | ICD-10-CM

## 2020-03-24 MED ORDER — ESCITALOPRAM OXALATE 10 MG PO TABS: 10.0000 mg | ORAL_TABLET | Freq: Every day | ORAL | 1 refills | Status: DC

## 2020-03-29 ENCOUNTER — Other Ambulatory Visit: Payer: Self-pay

## 2020-04-01 ENCOUNTER — Other Ambulatory Visit: Payer: Medicare Other

## 2020-04-04 ENCOUNTER — Other Ambulatory Visit: Payer: Self-pay

## 2020-04-04 MED ORDER — PANTOPRAZOLE SODIUM 40 MG PO TBEC: 40.0000 mg | DELAYED_RELEASE_TABLET | Freq: Every day | ORAL | 1 refills | Status: DC

## 2020-04-07 ENCOUNTER — Ambulatory Visit: Payer: Medicare Other | Admitting: Nurse Practitioner

## 2020-04-20 ENCOUNTER — Other Ambulatory Visit: Payer: Self-pay

## 2020-04-20 DIAGNOSIS — M064 Inflammatory polyarthropathy: Secondary | ICD-10-CM

## 2020-04-20 MED ORDER — TIZANIDINE HCL 2 MG PO TABS: 2.0000 mg | ORAL_TABLET | Freq: Three times a day (TID) | ORAL | 1 refills | Status: DC | PRN

## 2020-04-27 ENCOUNTER — Other Ambulatory Visit: Payer: Self-pay

## 2020-04-27 ENCOUNTER — Telehealth: Payer: Self-pay

## 2020-04-27 NOTE — Telephone Encounter (Signed)
Spoke with she had enough med until her appt we going refills on her appt

## 2020-04-28 ENCOUNTER — Telehealth: Payer: Self-pay

## 2020-04-28 NOTE — Telephone Encounter (Signed)
Patient has no showed to all ultrasounds we have scheduled for her, if she needs them redone this will need to be done at [facility] due to [place] on missing procedures at our office. Sarah Barnes

## 2020-05-03 ENCOUNTER — Encounter: Payer: Self-pay | Admitting: Nurse Practitioner

## 2020-05-03 ENCOUNTER — Ambulatory Visit (INDEPENDENT_AMBULATORY_CARE_PROVIDER_SITE_OTHER): Payer: Medicare Other | Admitting: Nurse Practitioner

## 2020-05-03 ENCOUNTER — Other Ambulatory Visit: Payer: Self-pay

## 2020-05-03 VITALS — BP 109/59 | HR 59 | Temp 97.0°F | Resp 16 | Ht 63.0 in | Wt 170.2 lb

## 2020-05-03 DIAGNOSIS — N289 Disorder of kidney and ureter, unspecified: Secondary | ICD-10-CM | POA: Diagnosis not present

## 2020-05-03 DIAGNOSIS — M064 Inflammatory polyarthropathy: Secondary | ICD-10-CM

## 2020-05-03 DIAGNOSIS — F411 Generalized anxiety disorder: Secondary | ICD-10-CM

## 2020-05-03 DIAGNOSIS — I10 Essential (primary) hypertension: Secondary | ICD-10-CM | POA: Diagnosis not present

## 2020-05-03 MED ORDER — CELECOXIB 200 MG PO CAPS: 200.0000 mg | ORAL_CAPSULE | Freq: Every day | ORAL | 1 refills | Status: DC

## 2020-05-03 NOTE — Progress Notes (Signed)
[facility] [place], [place] [address]  Internal MEDICINE  Office Visit Note  Patient Name: Sarah Barnes  [id number]  [id number]  Date of Service: 06/06/2020  Chief Complaint  Patient presents with  . Follow-up    Refill request, 04/22/20 pt was laying in bed watching tv when she got a pain above left eye, people on tv became distorted, and she felt like she was being pulled off her bed to the left   . Depression  . Gastroesophageal Reflux  . Hypertension  . Hyperlipidemia  . Anemia  . Anxiety  . Asthma  . COPD    Patient is here for follow up visit. Needs to have refills of her celebrex. She takes this for generalized joint pain. Does help her pain.  Was to have renal artery ultrasound. Missed appointment twice and needs to have this rescheduled.  Had incident while watching TV of left sided facial weakness and felt like she was leaning to the left. She states this lasted for several minutes then resolved. She has not has any further symptoms. She has had TIA in the past. Offered her a new MRI for further evaluation. She declines this for now.       Current Medication: Outpatient Encounter Medications as of 05/03/2020  Medication Sig  . aspirin EC 81 MG EC tablet Take 1 tablet (81 mg total) by mouth daily.  . busPIRone (BUSPAR) 15 MG tablet Take 1 tablet (15 mg total) by mouth 2 (two) times daily.  . carvedilol (COREG) 25 MG tablet Take 25 mg by mouth 2 (two) times daily.  Marland Kitchen escitalopram (LEXAPRO) 10 MG tablet Take 1 tablet (10 mg total) by mouth daily.  . fenofibrate micronized (LOFIBRA) 134 MG capsule Take 1 capsule (134 mg total) by mouth daily before breakfast. NEEDS APPOINTMENT FOR FUTURE REFILLS  . folic acid (FOLVITE) 1 MG tablet Take 1 tablet (1 mg total) by mouth daily.  . furosemide (LASIX) 40 MG tablet Take 40 mg by mouth daily.  . hydrochlorothiazide (HYDRODIURIL) 25 MG tablet Take 25 mg by mouth daily.  . Multiple Vitamin  (MULTIVITAMIN WITH MINERALS) TABS tablet Take 1 tablet by mouth daily.  . nicotine (NICODERM CQ - DOSED IN MG/24 HOURS) 21 mg/24hr patch Place 1 patch (21 mg total) onto the skin daily.  . pantoprazole (PROTONIX) 40 MG tablet Take 1 tablet (40 mg total) by mouth daily. NEEDS APPOINTMENT FOR FUTURE REFILLS  . prazosin (MINIPRESS) 2 MG capsule Take 1 capsule (2 mg total) by mouth daily. NEEDS APPOINTMENT FOR FUTURE REFILLS  . thiamine 100 MG tablet Take 1 tablet (100 mg total) by mouth daily.  Marland Kitchen tiZANidine (ZANAFLEX) 2 MG tablet Take 1 tablet (2 mg total) by mouth every 8 (eight) hours as needed for muscle spasms.  . traZODone (DESYREL) 100 MG tablet TAKE 1 TABLET BY MOUTH EVERYDAY AT BEDTIME  . [DISCONTINUED] celecoxib (CELEBREX) 200 MG capsule Take 1 capsule (200 mg total) by mouth daily.  . [DISCONTINUED] ergocalciferol (DRISDOL) 1.25 MG (50000 UT) capsule Take 1 capsule (50,000 Units total) by mouth once a week.  . [DISCONTINUED] gabapentin (NEURONTIN) 100 MG capsule Start taking 1 capsule po QPM. May titrate dosing up to TID as needed and as tolerated .  . albuterol (VENTOLIN HFA) 108 (90 Base) MCG/ACT inhaler Inhale 2 puffs into the lungs every 6 (six) hours as needed for wheezing. (Patient not taking: Reported on 05/03/2020)  . celecoxib (CELEBREX) 200 MG capsule Take 1 capsule (200  mg total) by mouth daily.   No facility-administered encounter medications on file as of 05/03/2020.    Surgical History: Past Surgical History:  Procedure Laterality Date  . ANAL FISTULECTOMY  1970s X 3  . BACK SURGERY    . HERNIA REPAIR    . KNEE ARTHROSCOPY Right   . LAPAROSCOPIC CHOLECYSTECTOMY    . LAPAROSCOPIC INCISIONAL / UMBILICAL / VENTRAL HERNIA REPAIR  08/13/2016   VHR w/mesh  . LUMBAR DISC SURGERY     "herniated disc"  . NASAL FRACTURE SURGERY  1970s X 2  . RIGHT/LEFT HEART CATH AND CORONARY ANGIOGRAPHY N/A 12/27/2016   Procedure: RIGHT/LEFT HEART CATH AND CORONARY ANGIOGRAPHY;  Surgeon:  Leonie Man, MD;  Location: [facility] INVASIVE CV LAB: Mild-moderate pulmonary hypertension.  Hyperdynamic ventricle noted.  EF 55-65% -hyperdynamic (unable to measure LVOT gradient).  Mildly elevated very tortuous but angiographically normal coronary arteries, suggesting hypertensive heart disease  . SHOULDER ARTHROSCOPY WITH ROTATOR CUFF REPAIR Right 1990s?  . TONSILLECTOMY  1951  . TRANSTHORACIC ECHOCARDIOGRAM  12/2016    EF 60-65% with dynamic outflow tract obstruction at rest. Peak gradient 52 mmHg consistent with HOCM.  GRII DD area. Aortic sclerosis but no stenosis. Systolic anterior motion of mitral valve chordae. Mild mitral stenosis. Moderate LA dilation and mild RA dilation. Peak PA pressures 35 mmHg.  Marland Kitchen VENTRAL HERNIA REPAIR N/A 08/13/2016   Procedure: LAPAROSCOPIC VENTRAL HERNIA REPAIR WITH MESH;  Surgeon: Judeth Horn, MD;  Location: [facility];  Service: General;  Laterality: N/A;    Medical History: Past Medical History:  Diagnosis Date  . Agoraphobia with panic disorder   . Anemia   . Arthritis    "all over" (08/13/2016)  . Chronic bronchitis ([place])   . COPD (chronic obstructive pulmonary disease) ([place])   . Depression   . Dyspnea    occ  . Dysrhythmia    palpitations occ due to anxiety  . ETOH abuse   . GAD (generalized anxiety disorder)   . GERD (gastroesophageal reflux disease)   . Headache    "daily til I got glasses; now have headache once in awhile" (08/13/2016)  . Hepatitis 1960   "isolated &  hospitalized for 2 weeks; don't know which kind of hepatitis"   . Hernia, hiatal   . High cholesterol   . Hypertension   . Hypertrophic obstructive cardiomyopathy ([place])   . Mild aortic stenosis   . Pneumonia    "once or twice" (08/13/2016)  . PONV (postoperative nausea and vomiting)   . Sciatica     Family History: Family History  Problem Relation Age of Onset  . Diabetes Mother   . Hyperlipidemia Mother   . Hypertension Mother   . Heart attack Mother 29  . Angina  Mother        chronic problem  . Hypertension Father   . Stroke Father   . Cancer Brother     Social History   Socioeconomic History  . Marital status: Single    Spouse name: Not on file  . Number of children: Not on file  . Years of education: Not on file  . Highest education level: Not on file  Occupational History  . Not on file  Tobacco Use  . Smoking status: Current Every Day Smoker    Packs/day: 1.50    Years: 49.00    Pack years: 73.50    Types: Cigarettes  . Smokeless tobacco: Never Used  Substance and Sexual Activity  . Alcohol use: Yes  Alcohol/week: 84.0 standard drinks    Types: 36 Cans of beer, 48 Shots of liquor per week    Comment: occ  . Drug use: Not Currently    Frequency: 1.0 times per week    Types: Marijuana, Cocaine    Comment: Cocaine + March 2017  . Sexual activity: Not Currently  Other Topics Concern  . Not on file  Social History Narrative   Jhordan has generalized anxiety disorder and clearcut Agoraphobia.   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    She currently lives with her sister.   Currently denies substance abuse beyond Tobacco (1-1/2 PPD) & EtOH (beer, vodka) - not ready to discuss quitting.   Social Determinants of Health   Financial Resource Strain: Not on file  Food Insecurity: Not on file  Transportation Needs: Not on file  Physical Activity: Not on file  Stress: Not on file  Social Connections: Not on file  Intimate Partner Violence: Not on file      Review of Systems  Constitutional: Positive for fatigue. Negative for activity change, chills and unexpected weight change.  HENT: Negative for congestion, postnasal drip, rhinorrhea, sneezing and sore throat.   Respiratory: Positive for shortness of breath and wheezing. Negative for cough and chest tightness.        With exertion.   Cardiovascular: Negative for chest pain  and palpitations.  Gastrointestinal: Negative for abdominal pain, constipation, diarrhea, nausea and vomiting.  Endocrine: Negative for cold intolerance, heat intolerance, polydipsia and polyuria.  Genitourinary: Negative for dysuria, frequency and urgency.  Musculoskeletal: Positive for arthralgias and myalgias. Negative for back pain, joint swelling and neck pain.       Left hip pain. This is worse when she first gets up and gets moving and gets better after she has been up and around for some time.  She reports generalized joint pain/tenderness.   Skin: Negative for rash.  Neurological: Negative for dizziness, tremors, numbness and headaches.       Single episode of left sided facial weakness which was severe but resolved after a few minutes.   Hematological: Negative for adenopathy. Does not bruise/bleed easily.  Psychiatric/Behavioral: Positive for dysphoric mood. Negative for behavioral problems (Depression), sleep disturbance and suicidal ideas. The patient is nervous/anxious.        Well managed with current medication.     Today's Vitals   05/03/20 1426  BP: (!) 109/59  Pulse: (!) 59  Resp: 16  Temp: (!) 97 F (36.1 C)  SpO2: 97%  Weight: 170 lb 3.2 oz (77.2 kg)  Height: 5\' 3"  (1.6 m)   Body mass index is 30.15 kg/m.  Physical Exam Vitals and nursing note reviewed.  Constitutional:      General: She is not in acute distress.    Appearance: Normal appearance. She is well-developed. She is not diaphoretic.  HENT:     Head: Normocephalic and atraumatic.     Nose: Nose normal.     Mouth/Throat:     Pharynx: No oropharyngeal exudate.  Eyes:     Pupils: Pupils are equal, round, and reactive to light.  Neck:     Thyroid: No thyromegaly.     Vascular: No carotid bruit or JVD.     Trachea: No tracheal deviation.  Cardiovascular:     Rate and Rhythm: Normal rate and regular rhythm.  Pulses: Normal pulses.     Heart sounds: Murmur heard.  No friction rub. No gallop.    Pulmonary:     Effort: Pulmonary effort is normal. No respiratory distress.     Breath sounds: Normal breath sounds. No wheezing or rales.     Comments: Diminished breath sounds throughout to breath sounds.  Chest:     Chest wall: No tenderness.  Abdominal:     Palpations: Abdomen is soft.     Tenderness: There is no abdominal tenderness.     Comments: There is generalized abdominal tenderness with palpation.  This has improved since her most recent visit.   Musculoskeletal:        General: Normal range of motion.     Cervical back: Normal range of motion and neck supple.     Comments: There is left hip tenderness which is worse with exertion. Finding a comfortable seated position due to the pain/tenderness. There are no visible or palpable abnormalities at this time.   Lymphadenopathy:     Cervical: No cervical adenopathy.  Skin:    General: Skin is warm and dry.  Neurological:     General: No focal deficit present.     Mental Status: She is alert and oriented to person, place, and time.     Cranial Nerves: No cranial nerve deficit.  Psychiatric:        Attention and Perception: Attention and perception normal.        Mood and Affect: Mood is anxious and depressed.        Speech: Speech normal.        Behavior: Behavior normal. Behavior is cooperative.        Thought Content: Thought content normal.        Cognition and Memory: Cognition and memory normal.        Judgment: Judgment normal.    Assessment/Plan: 1. Essential hypertension Stable. Continue bp medication as prescribed.   2. Inflammatory polyarthropathy ([place]) May continue celebrex 200mg  daily to reduce pain and inflammation. New prescription provided today.  - celecoxib (CELEBREX) 200 MG capsule; Take 1 capsule (200 mg total) by mouth daily.  Dispense: 90 capsule; Refill: 1  3. Abnormal renal function finding Will have ultrasound to evaluate for renal artery stenosis rescheduled for further evaluation.   4.  Generalized anxiety disorder Continue meds and regular visits with psychiatry as scheduled.   General Counseling: Caroljean verbalizes understanding of the findings of todays visit and agrees with plan of treatment. I have discussed any further diagnostic evaluation that may be needed or ordered today. We also reviewed her medications today. she has been encouraged to call the office with any questions or concerns that should arise related to todays visit.   This patient was seen by [place] with Dr [name] as a part of collaborative care agreement  Meds ordered this encounter  Medications  . celecoxib (CELEBREX) 200 MG capsule    Sig: Take 1 capsule (200 mg total) by mouth daily.    Dispense:  90 capsule    Refill:  1    Order Specific Question:   Supervising Provider    Answer:   [name] [[id number]]    Total time spent: 25 Minutes   Time spent includes review of chart, medications, test results, and follow up plan with the patient.      Dr [name] Internal medicine

## 2020-05-26 ENCOUNTER — Other Ambulatory Visit: Payer: Self-pay

## 2020-05-26 DIAGNOSIS — M79604 Pain in right leg: Secondary | ICD-10-CM

## 2020-05-26 MED ORDER — GABAPENTIN 100 MG PO CAPS: ORAL_CAPSULE | ORAL | 0 refills | Status: DC

## 2020-05-31 ENCOUNTER — Other Ambulatory Visit: Payer: Self-pay

## 2020-05-31 DIAGNOSIS — E559 Vitamin D deficiency, unspecified: Secondary | ICD-10-CM

## 2020-05-31 MED ORDER — ERGOCALCIFEROL 1.25 MG (50000 UT) PO CAPS: 50000.0000 [IU] | ORAL_CAPSULE | ORAL | 1 refills | Status: DC

## 2020-06-01 ENCOUNTER — Telehealth: Payer: Self-pay

## 2020-06-01 NOTE — Telephone Encounter (Signed)
Pt tested positive for COVID, bad cough, low grade fever, diarrhea for 4 days, body aches. Pt was told to inform PCP and she said she will call back if she feels she needs meds or symptoms get worse.

## 2020-06-01 NOTE — Telephone Encounter (Signed)
I asked her during our call and she said she would call back if she needed anything or symptoms worsened. She was just calling because she was directed to inform PCP.

## 2020-06-01 NOTE — Telephone Encounter (Signed)
Does she want something sent in now?

## 2020-06-01 NOTE — Telephone Encounter (Signed)
Ok great. Thank you

## 2020-06-06 ENCOUNTER — Encounter: Payer: Self-pay | Admitting: Nurse Practitioner

## 2020-06-07 ENCOUNTER — Other Ambulatory Visit: Payer: Self-pay

## 2020-06-07 DIAGNOSIS — F411 Generalized anxiety disorder: Secondary | ICD-10-CM

## 2020-06-07 MED ORDER — BUSPIRONE HCL 15 MG PO TABS: 15.0000 mg | ORAL_TABLET | Freq: Two times a day (BID) | ORAL | 0 refills | Status: DC

## 2020-06-16 ENCOUNTER — Other Ambulatory Visit: Payer: Self-pay

## 2020-06-16 MED ORDER — FUROSEMIDE 40 MG PO TABS
40.0000 mg | ORAL_TABLET | Freq: Every day | ORAL | 0 refills | Status: DC
Start: 2020-06-16 — End: 2020-09-06

## 2020-06-30 ENCOUNTER — Other Ambulatory Visit: Payer: Self-pay

## 2020-06-30 MED ORDER — TRAZODONE HCL 100 MG PO TABS
ORAL_TABLET | ORAL | 0 refills | Status: DC
Start: 2020-06-30 — End: 2020-08-25

## 2020-07-04 ENCOUNTER — Other Ambulatory Visit: Payer: Self-pay

## 2020-07-04 ENCOUNTER — Observation Stay
Admission: EM | Admit: 2020-07-04 | Discharge: 2020-07-05 | Disposition: A | Discharge status: Home / Self Care | Payer: Medicare Other | Attending: Internal Medicine | Admitting: Internal Medicine

## 2020-07-04 ENCOUNTER — Emergency Department: Payer: Medicare Other

## 2020-07-04 ENCOUNTER — Observation Stay: Payer: Medicare Other

## 2020-07-04 ENCOUNTER — Encounter: Payer: Self-pay | Admitting: Emergency Medicine

## 2020-07-04 DIAGNOSIS — I130 Hypertensive heart and chronic kidney disease with heart failure and stage 1 through stage 4 chronic kidney disease, or unspecified chronic kidney disease: Secondary | ICD-10-CM | POA: Diagnosis not present

## 2020-07-04 DIAGNOSIS — Z7982 Long term (current) use of aspirin: Secondary | ICD-10-CM | POA: Diagnosis not present

## 2020-07-04 DIAGNOSIS — J449 Chronic obstructive pulmonary disease, unspecified: Secondary | ICD-10-CM

## 2020-07-04 DIAGNOSIS — I6389 Other cerebral infarction: Secondary | ICD-10-CM | POA: Diagnosis not present

## 2020-07-04 DIAGNOSIS — I421 Obstructive hypertrophic cardiomyopathy: Secondary | ICD-10-CM

## 2020-07-04 DIAGNOSIS — Z79899 Other long term (current) drug therapy: Secondary | ICD-10-CM | POA: Diagnosis not present

## 2020-07-04 DIAGNOSIS — I5032 Chronic diastolic (congestive) heart failure: Secondary | ICD-10-CM | POA: Diagnosis not present

## 2020-07-04 DIAGNOSIS — I2510 Atherosclerotic heart disease of native coronary artery without angina pectoris: Secondary | ICD-10-CM | POA: Diagnosis not present

## 2020-07-04 DIAGNOSIS — R519 Headache, unspecified: Secondary | ICD-10-CM | POA: Diagnosis not present

## 2020-07-04 DIAGNOSIS — K219 Gastro-esophageal reflux disease without esophagitis: Secondary | ICD-10-CM | POA: Diagnosis present

## 2020-07-04 DIAGNOSIS — I639 Cerebral infarction, unspecified: Secondary | ICD-10-CM | POA: Diagnosis not present

## 2020-07-04 DIAGNOSIS — I6309 Cerebral infarction due to thrombosis of other precerebral artery: Secondary | ICD-10-CM

## 2020-07-04 DIAGNOSIS — G9389 Other specified disorders of brain: Secondary | ICD-10-CM | POA: Diagnosis not present

## 2020-07-04 DIAGNOSIS — R000 Tachycardia, unspecified: Secondary | ICD-10-CM | POA: Diagnosis not present

## 2020-07-04 DIAGNOSIS — F17210 Nicotine dependence, cigarettes, uncomplicated: Secondary | ICD-10-CM | POA: Diagnosis not present

## 2020-07-04 DIAGNOSIS — H5460 Unqualified visual loss, one eye, unspecified: Secondary | ICD-10-CM

## 2020-07-04 DIAGNOSIS — H547 Unspecified visual loss: Secondary | ICD-10-CM | POA: Diagnosis not present

## 2020-07-04 DIAGNOSIS — F332 Major depressive disorder, recurrent severe without psychotic features: Secondary | ICD-10-CM | POA: Diagnosis present

## 2020-07-04 DIAGNOSIS — I6782 Cerebral ischemia: Secondary | ICD-10-CM | POA: Diagnosis not present

## 2020-07-04 DIAGNOSIS — Z720 Tobacco use: Secondary | ICD-10-CM

## 2020-07-04 DIAGNOSIS — N183 Chronic kidney disease, stage 3 unspecified: Secondary | ICD-10-CM | POA: Diagnosis present

## 2020-07-04 DIAGNOSIS — N1832 Chronic kidney disease, stage 3b: Secondary | ICD-10-CM | POA: Diagnosis not present

## 2020-07-04 DIAGNOSIS — I10 Essential (primary) hypertension: Secondary | ICD-10-CM

## 2020-07-04 DIAGNOSIS — H34231 Retinal artery branch occlusion, right eye: Secondary | ICD-10-CM | POA: Diagnosis not present

## 2020-07-04 DIAGNOSIS — R200 Anesthesia of skin: Secondary | ICD-10-CM | POA: Diagnosis not present

## 2020-07-04 DIAGNOSIS — E785 Hyperlipidemia, unspecified: Secondary | ICD-10-CM | POA: Diagnosis not present

## 2020-07-04 DIAGNOSIS — H53139 Sudden visual loss, unspecified eye: Secondary | ICD-10-CM | POA: Diagnosis not present

## 2020-07-04 DIAGNOSIS — R29810 Facial weakness: Secondary | ICD-10-CM | POA: Diagnosis present

## 2020-07-04 DIAGNOSIS — Z20822 Contact with and (suspected) exposure to covid-19: Secondary | ICD-10-CM | POA: Insufficient documentation

## 2020-07-04 DIAGNOSIS — F1010 Alcohol abuse, uncomplicated: Secondary | ICD-10-CM

## 2020-07-04 DIAGNOSIS — I6521 Occlusion and stenosis of right carotid artery: Secondary | ICD-10-CM | POA: Diagnosis not present

## 2020-07-04 LAB — BRAIN NATRIURETIC PEPTIDE: B Natriuretic Peptide: 137.4 pg/mL — ABNORMAL HIGH (ref 0.0–100.0)

## 2020-07-04 LAB — URINE DRUG SCREEN, QUALITATIVE (ARMC ONLY)
Amphetamines, Ur Screen: NOT DETECTED
Barbiturates, Ur Screen: NOT DETECTED
Benzodiazepine, Ur Scrn: NOT DETECTED
Cannabinoid 50 Ng, Ur ~~LOC~~: NOT DETECTED
Cocaine Metabolite,Ur ~~LOC~~: NOT DETECTED
MDMA (Ecstasy)Ur Screen: NOT DETECTED
Methadone Scn, Ur: NOT DETECTED
Opiate, Ur Screen: NOT DETECTED
Phencyclidine (PCP) Ur S: NOT DETECTED
Tricyclic, Ur Screen: NOT DETECTED

## 2020-07-04 LAB — CBC
HCT: 41.8 % (ref 36.0–46.0)
Hemoglobin: 14.3 g/dL (ref 12.0–15.0)
MCH: 31.4 pg (ref 26.0–34.0)
MCHC: 34.2 g/dL (ref 30.0–36.0)
MCV: 91.9 fL (ref 80.0–100.0)
Platelets: 282 10*3/uL (ref 150–400)
RBC: 4.55 MIL/uL (ref 3.87–5.11)
RDW: 12.7 % (ref 11.5–15.5)
WBC: 8.9 10*3/uL (ref 4.0–10.5)
nRBC: 0 % (ref 0.0–0.2)

## 2020-07-04 LAB — APTT: aPTT: 31 seconds (ref 24–36)

## 2020-07-04 LAB — COMPREHENSIVE METABOLIC PANEL
ALT: 14 U/L (ref 0–44)
AST: 19 U/L (ref 15–41)
Albumin: 4.5 g/dL (ref 3.5–5.0)
Alkaline Phosphatase: 40 U/L (ref 38–126)
Anion gap: 13 (ref 5–15)
BUN: 27 mg/dL — ABNORMAL HIGH (ref 8–23)
CO2: 27 mmol/L (ref 22–32)
Calcium: 9.4 mg/dL (ref 8.9–10.3)
Chloride: 99 mmol/L (ref 98–111)
Creatinine, Ser: 1.48 mg/dL — ABNORMAL HIGH (ref 0.44–1.00)
GFR, Estimated: 38 mL/min — ABNORMAL LOW (ref >60)
Glucose, Bld: 97 mg/dL (ref 70–99)
Potassium: 4.1 mmol/L (ref 3.5–5.1)
Sodium: 139 mmol/L (ref 135–145)
Total Bilirubin: 0.7 mg/dL (ref 0.3–1.2)
Total Protein: 7.3 g/dL (ref 6.5–8.1)

## 2020-07-04 LAB — DIFFERENTIAL
Abs Immature Granulocytes: 0.02 10*3/uL (ref 0.00–0.07)
Basophils Absolute: 0 10*3/uL (ref 0.0–0.1)
Basophils Relative: 0 %
Eosinophils Absolute: 0.3 10*3/uL (ref 0.0–0.5)
Eosinophils Relative: 3 %
Immature Granulocytes: 0 %
Lymphocytes Relative: 44 %
Lymphs Abs: 3.9 10*3/uL (ref 0.7–4.0)
Monocytes Absolute: 0.7 10*3/uL (ref 0.1–1.0)
Monocytes Relative: 8 %
Neutro Abs: 4 10*3/uL (ref 1.7–7.7)
Neutrophils Relative %: 45 %

## 2020-07-04 LAB — C-REACTIVE PROTEIN: CRP: 0.5 mg/dL (ref <1.0)

## 2020-07-04 LAB — GLUCOSE, CAPILLARY: Glucose-Capillary: 82 mg/dL (ref 70–99)

## 2020-07-04 LAB — SEDIMENTATION RATE: Sed Rate: 25 mm/hr (ref 0–30)

## 2020-07-04 LAB — PROTIME-INR
INR: 1 (ref 0.8–1.2)
Prothrombin Time: 12.3 seconds (ref 11.4–15.2)

## 2020-07-04 MED ORDER — LORAZEPAM 2 MG/ML IJ SOLN: 1.0000 mg | INTRAMUSCULAR | Status: DC | PRN

## 2020-07-04 MED ORDER — ASPIRIN EC 81 MG PO TBEC
81.0000 mg | DELAYED_RELEASE_TABLET | Freq: Every day | ORAL | Status: DC
  Administered 2020-07-05: 09:00:00 81 mg via ORAL
  Filled 2020-07-04: qty 1

## 2020-07-04 MED ORDER — THIAMINE HCL 100 MG PO TABS
100.0000 mg | ORAL_TABLET | Freq: Every day | ORAL | Status: DC
  Administered 2020-07-04 – 2020-07-05 (×2): 100 mg via ORAL
  Filled 2020-07-04 (×2): qty 1

## 2020-07-04 MED ORDER — PANTOPRAZOLE SODIUM 40 MG PO TBEC
40.0000 mg | DELAYED_RELEASE_TABLET | Freq: Every day | ORAL | Status: DC
  Administered 2020-07-05: 09:00:00 40 mg via ORAL
  Filled 2020-07-04: qty 1

## 2020-07-04 MED ORDER — IOHEXOL 350 MG/ML SOLN
60.0000 mL | Freq: Once | INTRAVENOUS | Status: AC | PRN
  Administered 2020-07-04: 60 mL via INTRAVENOUS

## 2020-07-04 MED ORDER — TRAZODONE HCL 50 MG PO TABS: 50.0000 mg | ORAL_TABLET | Freq: Every evening | ORAL | Status: DC | PRN

## 2020-07-04 MED ORDER — ACETAMINOPHEN 325 MG PO TABS: 650.0000 mg | ORAL_TABLET | ORAL | Status: DC | PRN

## 2020-07-04 MED ORDER — SODIUM CHLORIDE 0.9% FLUSH: 3.0000 mL | Freq: Once | INTRAVENOUS | Status: DC

## 2020-07-04 MED ORDER — ACETAMINOPHEN 160 MG/5ML PO SOLN
650.0000 mg | ORAL | Status: DC | PRN
Start: 2020-07-04 — End: 2020-07-05
  Filled 2020-07-04: qty 20.3

## 2020-07-04 MED ORDER — PANTOPRAZOLE SODIUM 40 MG PO TBEC: 40.0000 mg | DELAYED_RELEASE_TABLET | Freq: Every day | ORAL | Status: DC

## 2020-07-04 MED ORDER — NICOTINE 21 MG/24HR TD PT24
21.0000 mg | MEDICATED_PATCH | Freq: Every day | TRANSDERMAL | Status: DC
  Administered 2020-07-04 – 2020-07-05 (×2): 21 mg via TRANSDERMAL
  Filled 2020-07-04 (×2): qty 1

## 2020-07-04 MED ORDER — ESCITALOPRAM OXALATE 10 MG PO TABS
10.0000 mg | ORAL_TABLET | Freq: Every day | ORAL | Status: DC
  Administered 2020-07-05: 09:00:00 10 mg via ORAL
  Filled 2020-07-04: qty 1

## 2020-07-04 MED ORDER — HYDRALAZINE HCL 20 MG/ML IJ SOLN: 5.0000 mg | INTRAMUSCULAR | Status: DC | PRN

## 2020-07-04 MED ORDER — ONDANSETRON HCL 4 MG/2ML IJ SOLN: 4.0000 mg | Freq: Three times a day (TID) | INTRAMUSCULAR | Status: DC | PRN

## 2020-07-04 MED ORDER — LORAZEPAM 1 MG PO TABS
1.0000 mg | ORAL_TABLET | ORAL | Status: DC | PRN
Start: 2020-07-04 — End: 2020-07-05

## 2020-07-04 MED ORDER — STROKE: EARLY STAGES OF RECOVERY BOOK: Freq: Once | Status: AC

## 2020-07-04 MED ORDER — DM-GUAIFENESIN ER 30-600 MG PO TB12: 1.0000 | ORAL_TABLET | Freq: Two times a day (BID) | ORAL | Status: DC | PRN

## 2020-07-04 MED ORDER — SENNOSIDES-DOCUSATE SODIUM 8.6-50 MG PO TABS: 1.0000 | ORAL_TABLET | Freq: Every evening | ORAL | Status: DC | PRN

## 2020-07-04 MED ORDER — FENOFIBRATE 160 MG PO TABS
160.0000 mg | ORAL_TABLET | Freq: Every day | ORAL | Status: DC
  Administered 2020-07-05: 160 mg via ORAL
  Filled 2020-07-04: qty 1

## 2020-07-04 MED ORDER — GABAPENTIN 100 MG PO CAPS: 100.0000 mg | ORAL_CAPSULE | Freq: Three times a day (TID) | ORAL | Status: DC | PRN

## 2020-07-04 MED ORDER — BUSPIRONE HCL 15 MG PO TABS
15.0000 mg | ORAL_TABLET | Freq: Two times a day (BID) | ORAL | Status: DC
  Administered 2020-07-05: 09:00:00 15 mg via ORAL
  Filled 2020-07-04 (×2): qty 1

## 2020-07-04 MED ORDER — THIAMINE HCL 100 MG/ML IJ SOLN: 100.0000 mg | Freq: Every day | INTRAMUSCULAR | Status: DC

## 2020-07-04 MED ORDER — ACETAMINOPHEN 650 MG RE SUPP: 650.0000 mg | RECTAL | Status: DC | PRN

## 2020-07-04 MED ORDER — ENOXAPARIN SODIUM 40 MG/0.4ML ~~LOC~~ SOLN
40.0000 mg | SUBCUTANEOUS | Status: DC
  Administered 2020-07-04: 40 mg via SUBCUTANEOUS

## 2020-07-04 MED ORDER — TIZANIDINE HCL 2 MG PO TABS
2.0000 mg | ORAL_TABLET | Freq: Three times a day (TID) | ORAL | Status: DC | PRN
Start: 2020-07-04 — End: 2020-07-05
  Filled 2020-07-04: qty 1

## 2020-07-04 MED ORDER — ALBUTEROL SULFATE HFA 108 (90 BASE) MCG/ACT IN AERS
2.0000 | INHALATION_SPRAY | RESPIRATORY_TRACT | Status: DC | PRN
  Filled 2020-07-04: qty 6.7

## 2020-07-04 MED ORDER — LORAZEPAM 2 MG/ML IJ SOLN
0.0000 mg | Freq: Four times a day (QID) | INTRAMUSCULAR | Status: DC
  Administered 2020-07-04: 22:00:00 2 mg via INTRAVENOUS
  Administered 2020-07-05: 0.5 mg via INTRAVENOUS
  Administered 2020-07-05: 2 mg via INTRAVENOUS
  Filled 2020-07-04 (×3): qty 1

## 2020-07-04 MED ORDER — ASPIRIN 81 MG PO CHEW
324.0000 mg | CHEWABLE_TABLET | Freq: Once | ORAL | Status: AC
  Administered 2020-07-04: 324 mg via ORAL
  Filled 2020-07-04: qty 4

## 2020-07-04 MED ORDER — IRBESARTAN 150 MG PO TABS
150.0000 mg | ORAL_TABLET | Freq: Every day | ORAL | Status: DC
  Administered 2020-07-05: 150 mg via ORAL
  Filled 2020-07-04: qty 1

## 2020-07-04 MED ORDER — FOLIC ACID 1 MG PO TABS
1.0000 mg | ORAL_TABLET | Freq: Every day | ORAL | Status: DC
  Administered 2020-07-04 – 2020-07-05 (×2): 1 mg via ORAL
  Filled 2020-07-04 (×2): qty 1

## 2020-07-04 MED ORDER — CARVEDILOL 25 MG PO TABS
25.0000 mg | ORAL_TABLET | Freq: Two times a day (BID) | ORAL | Status: DC
  Administered 2020-07-04 – 2020-07-05 (×2): 25 mg via ORAL
  Filled 2020-07-04 (×2): qty 1

## 2020-07-04 MED ORDER — LORAZEPAM 2 MG/ML IJ SOLN: 0.0000 mg | Freq: Two times a day (BID) | INTRAMUSCULAR | Status: DC

## 2020-07-04 MED ORDER — ATORVASTATIN CALCIUM 20 MG PO TABS
40.0000 mg | ORAL_TABLET | Freq: Every day | ORAL | Status: DC
  Administered 2020-07-04 – 2020-07-05 (×2): 40 mg via ORAL
  Filled 2020-07-04 (×2): qty 2

## 2020-07-04 MED ORDER — ADULT MULTIVITAMIN W/MINERALS CH
1.0000 | ORAL_TABLET | Freq: Every day | ORAL | Status: DC
  Administered 2020-07-04 – 2020-07-05 (×2): 1 via ORAL
  Filled 2020-07-04 (×2): qty 1

## 2020-07-04 NOTE — ED Triage Notes (Signed)
Pt in w/R face numbness since 2230 last light. States LSN 2030 when she went to bed. Woke at 2230 with R facial numbness and visual deficits in R eye. These remain today and have not resolved. No extremity weakness noted, speech clear, does have a HA. Hx of TIA's

## 2020-07-04 NOTE — ED Provider Notes (Signed)
[facility] Emergency Department Provider Note ____________________________________________   Event Date/Time   First MD Initiated Contact with Patient 07/04/20 1155     (approximate)  I have reviewed the triage vital signs and the nursing notes.  HISTORY  Chief Complaint R facial numbness, R blurred vision   HPI Sarah Barnes is a 70 y.o. femalewho presents to the ED for evaluation of right sided facial numbness and vision loss.   Chart review indicates hx GERD, anxiety, tobacco and alcohol abuse, HTN and HOCM.  She reports a history of TIAs without history of deficits or full strokes.  Patient presents to the ED for evaluation of right-sided vision changes.  She reports last feeling normal before falling asleep yesterday at about 8:30 PM on 2/13.  She reports awakening in the middle of the night with a sensation of right eye being swollen shut due to decreased vision loss.  She reports looking at herself in the mirror and noting her eye was not swollen shut but still having persistent visual changes.  She has had vision loss in her right eye since 8:30 PM last night.  She reports mild global aching headache that is 3/10 intensity as well.  Denies falls, trauma or syncope.  Past Medical History:  Diagnosis Date  . Agoraphobia with panic disorder   . Anemia   . Arthritis    "all over" (08/13/2016)  . Chronic bronchitis ([place])   . COPD (chronic obstructive pulmonary disease) ([place])   . Depression   . Dyspnea    occ  . Dysrhythmia    palpitations occ due to anxiety  . ETOH abuse   . GAD (generalized anxiety disorder)   . GERD (gastroesophageal reflux disease)   . Headache    "daily til I got glasses; now have headache once in awhile" (08/13/2016)  . Hepatitis 1960   "isolated &  hospitalized for 2 weeks; don't know which kind of hepatitis"   . Hernia, hiatal   . High cholesterol   . Hypertension   . Hypertrophic obstructive cardiomyopathy ([place])    . Mild aortic stenosis   . Pneumonia    "once or twice" (08/13/2016)  . PONV (postoperative nausea and vomiting)   . Sciatica     Patient Active Problem List   Diagnosis Date Noted  . Encounter for general adult medical examination with abnormal findings 02/09/2020  . Abnormal renal function finding 02/09/2020  . Vitamin D deficiency 02/09/2020  . Flu vaccine need 02/09/2020  . Dysuria 02/09/2020  . Bilateral leg pain 11/28/2019  . Generalized abdominal pain 11/28/2019  . Inflammatory polyarthropathy ([place]) 11/28/2019  . Generalized weakness 01/26/2019  . Alcohol use disorder   . Hypertrophic obstructive cardiomyopathy ([place])   . Aortic valve stenosis   . Abnormal EKG 12/26/2016  . Troponin level elevated 12/26/2016  . Syncope 12/25/2016  . Heart palpitations - PACs 12/24/2016  . Shortness of breath at rest 12/24/2016  . Ventral hernia 08/13/2016  . Panic disorder with agoraphobia 07/13/2015  . Major depressive disorder, recurrent severe without psychotic features ([place]) 07/12/2015  . Tobacco use disorder 07/12/2015  . Substance abuse ([place]) 07/12/2015  . Knee pain, bilateral 09/17/2014  . Generalized anxiety disorder 05/17/2014  . Hypertriglyceridemia 05/05/2014  . GERD (gastroesophageal reflux disease) 05/05/2014  . Essential hypertension 05/05/2014    Past Surgical History:  Procedure Laterality Date  . ANAL FISTULECTOMY  1970s X 3  . BACK SURGERY    . HERNIA REPAIR    .  KNEE ARTHROSCOPY Right   . LAPAROSCOPIC CHOLECYSTECTOMY    . LAPAROSCOPIC INCISIONAL / UMBILICAL / VENTRAL HERNIA REPAIR  08/13/2016   VHR w/mesh  . LUMBAR DISC SURGERY     "herniated disc"  . NASAL FRACTURE SURGERY  1970s X 2  . RIGHT/LEFT HEART CATH AND CORONARY ANGIOGRAPHY N/A 12/27/2016   Procedure: RIGHT/LEFT HEART CATH AND CORONARY ANGIOGRAPHY;  Surgeon: Leonie [facility], MD;  Location: [place] INVASIVE CV LAB: Mild-moderate pulmonary hypertension.  Hyperdynamic ventricle noted.  EF 55-65%  -hyperdynamic (unable to measure LVOT gradient).  Mildly elevated very tortuous but angiographically normal coronary arteries, suggesting hypertensive heart disease  . SHOULDER ARTHROSCOPY WITH ROTATOR CUFF REPAIR Right 1990s?  . TONSILLECTOMY  1951  . TRANSTHORACIC ECHOCARDIOGRAM  12/2016    EF 60-65% with dynamic outflow tract obstruction at rest. Peak gradient 52 mmHg consistent with HOCM.  GRII DD area. Aortic sclerosis but no stenosis. Systolic anterior motion of mitral valve chordae. Mild mitral stenosis. Moderate LA dilation and mild RA dilation. Peak PA pressures 35 mmHg.  Mar[facility] VENTRAL HERNIA REPAIR N/A 08/13/2016   Procedure: LAPAROSCOPIC VENTRAL HERNIA REPAIR WITH MESH;  Surgeon: Judeth Horn, MD;  Location: [facility];  Service: General;  Laterality: N/A;    Prior to Admission medications   Medication Sig Start Date End Date Taking? Authorizing Provider  albuterol (VENTOLIN HFA) 108 (90 Base) MCG/ACT inhaler Inhale 2 puffs into the lungs every 6 (six) hours as needed for wheezing. Patient not taking: Reported on 05/03/2020    [provider]  aspirin EC 81 MG EC tablet Take 1 tablet (81 mg total) by mouth daily. 12/29/16   Barton Dubois, MD  busPIRone (BUSPAR) 15 MG tablet Take 1 tablet (15 mg total) by mouth 2 (two) times daily. 06/07/20   [name], MD  carvedilol (COREG) 25 MG tablet Take 25 mg by mouth 2 (two) times daily. 11/19/19   [provider]  celecoxib (CELEBREX) 200 MG capsule Take 1 capsule (200 mg total) by mouth daily. 05/03/20   Ronnell Freshwater, NP  ergocalciferol (DRISDOL) 1.25 MG (50000 UT) capsule Take 1 capsule (50,000 Units total) by mouth once a week. 05/31/20   [name], MD  escitalopram (LEXAPRO) 10 MG tablet Take 1 tablet (10 mg total) by mouth daily. 03/24/20   Ronnell Freshwater, NP  fenofibrate micronized (LOFIBRA) 134 MG capsule Take 1 capsule (134 mg total) by mouth daily before breakfast. NEEDS APPOINTMENT FOR FUTURE REFILLS 06/20/18    Leonie Man, MD  folic acid (FOLVITE) 1 MG tablet Take 1 tablet (1 mg total) by mouth daily. 01/28/19   Demetrios Loll, MD  furosemide (LASIX) 40 MG tablet Take 1 tablet (40 mg total) by mouth daily. 06/16/20   [name], MD  gabapentin (NEURONTIN) 100 MG capsule Start taking 1 capsule po QPM. May titrate dosing up to TID as needed and as tolerated . 05/26/20   Ronnell Freshwater, NP  hydrochlorothiazide (HYDRODIURIL) 25 MG tablet Take 25 mg by mouth daily. 11/01/19   [provider]  Multiple Vitamin (MULTIVITAMIN WITH MINERALS) TABS tablet Take 1 tablet by mouth daily. 12/29/16   Barton Dubois, MD  nicotine (NICODERM CQ - DOSED IN MG/24 HOURS) 21 mg/24hr patch Place 1 patch (21 mg total) onto the skin daily. 12/29/16   Barton Dubois, MD  pantoprazole (PROTONIX) 40 MG tablet Take 1 tablet (40 mg total) by mouth daily. NEEDS APPOINTMENT FOR FUTURE REFILLS 04/04/20   Ronnell Freshwater, NP  prazosin (MINIPRESS) 2 MG capsule Take 1 capsule (2 mg total) by mouth daily. NEEDS APPOINTMENT FOR FUTURE REFILLS 06/20/18   Leonie Man, MD  thiamine 100 MG tablet Take 1 tablet (100 mg total) by mouth daily. 01/28/19   Demetrios Loll, MD  tiZANidine (ZANAFLEX) 2 MG tablet Take 1 tablet (2 mg total) by mouth every 8 (eight) hours as needed for muscle spasms. 04/20/20   [name], MD  traZODone (DESYREL) 100 MG tablet TAKE 1 TABLET BY MOUTH EVERYDAY AT BEDTIME 06/30/20   [name], MD    Allergies Patient has no known allergies.  Family History  Problem Relation Age of Onset  . Diabetes Mother   . Hyperlipidemia Mother   . Hypertension Mother   . Heart attack Mother 57  . Angina Mother        chronic problem  . Hypertension Father   . Stroke Father   . Cancer Brother     Social History Social History   Tobacco Use  . Smoking status: Current Every Day Smoker    Packs/day: 1.50    Years: 49.00    Pack years: 73.50    Types: Cigarettes  . Smokeless tobacco: Never Used  Substance Use  Topics  . Alcohol use: Yes    Alcohol/week: 84.0 standard drinks    Types: 36 Cans of beer, 48 Shots of liquor per week    Comment: occ  . Drug use: Not Currently    Frequency: 1.0 times per week    Types: Marijuana, Cocaine    Comment: Cocaine + March 2017    Review of Systems  Constitutional: No fever/chills Eyes: No visual changes. ENT: No sore throat. Cardiovascular: Denies chest pain. Respiratory: Denies shortness of breath. Gastrointestinal: No abdominal pain.  No nausea, no vomiting.  No diarrhea.  No constipation. Genitourinary: Negative for dysuria. Musculoskeletal: Negative for back pain. Skin: Negative for rash. Neurological: Positive for headache and vision changes.  ____________________________________________   PHYSICAL EXAM:  VITAL SIGNS: Vitals:   07/04/20 1107  BP: 124/75  Pulse: (!) 55  Resp: 18  Temp: 98 F (36.7 C)  SpO2: 97%    Constitutional: Alert and oriented. Well appearing and in no acute distress. Eyes: Conjunctivae are normal. PERRL. EOMI. Head: Atraumatic. Nose: No congestion/rhinnorhea. Mouth/Throat: Mucous membranes are moist.  Oropharynx non-erythematous. Neck: No stridor. No cervical spine tenderness to palpation. Cardiovascular: Normal rate, regular rhythm. Grossly normal heart sounds.  Good peripheral circulation. Respiratory: Normal respiratory effort.  No retractions. Lungs CTAB. Gastrointestinal: Soft , nondistended, nontender to palpation. No CVA tenderness. Musculoskeletal: No lower extremity tenderness nor edema.  No joint effusions. No signs of acute trauma. Neurologic:  Normal speech and language.No gait instability noted. Superior visual fields of the right eye, when covering the left eye, with decreased visual acuity.  Intact but diminished inferior visual fields to the right eye When covering the right eye, visual fields on the left are intact.  Minimal right-sided facial droop noted with flattening of nasolabial folds,  otherwise cranial nerves II through XII intact. 5/5 strength and sensation to all 4 extremities without apparent deficit. Mild right-sided dysmetria is noted. Skin:  Skin is warm, dry and intact. No rash noted. Psychiatric: Mood and affect are normal. Speech and behavior are normal.  ____________________________________________   LABS (all labs ordered are listed, but only abnormal results are displayed)  Labs Reviewed  COMPREHENSIVE METABOLIC PANEL - Abnormal; Notable for the following components:      Result  Value   BUN 27 (*)    Creatinine, Ser 1.48 (*)    GFR, Estimated 38 (*)    All other components within normal limits  SARS CORONAVIRUS 2 (TAT 6-24 HRS)  PROTIME-INR  APTT  CBC  DIFFERENTIAL  SEDIMENTATION RATE  C-REACTIVE PROTEIN  I-STAT CREATININE, ED  CBG MONITORING, ED   ____________________________________________  12 Lead EKG  Sinus rhythm, rate of 51 bpm. Normal axis and intervals. No evidence of acute ischemia. Sinus bradycardia. ____________________________________________  RADIOLOGY  ED MD interpretation: CT head reviewed by me without evidence of acute ICH.  Official radiology report(s): CT Angio Head W or Wo Contrast  Result Date: 07/04/2020 CLINICAL DATA:  Neuro deficit, acute, stroke suspected. EXAM: CT ANGIOGRAPHY HEAD AND NECK TECHNIQUE: Multidetector CT imaging of the head and neck was performed using the standard protocol during bolus administration of intravenous contrast. Multiplanar CT image reconstructions and MIPs were obtained to evaluate the vascular anatomy. Carotid stenosis measurements (when applicable) are obtained utilizing NASCET criteria, using the distal internal carotid diameter as the denominator. CONTRAST:  37mL OMNIPAQUE IOHEXOL 350 MG/ML SOLN COMPARISON:  Head CT July 04, 2020. FINDINGS: CTA NECK FINDINGS Aortic arch: Standard branching. Imaged portion shows no evidence of aneurysm or dissection. Calcified plaques at the origin  of the innominate and left subclavian artery without hemodynamically significant stenosis. Right carotid system: Mild atherosclerotic changes at the right carotid bifurcation. No evidence of dissection, stenosis (50% or greater) or occlusion. Left carotid system: No evidence of dissection, stenosis (50% or greater) or occlusion. Vertebral arteries: Right dominant. No evidence of dissection, stenosis (50% or greater) or occlusion. Skeleton: Negative. Other neck: Negative. Upper chest: Negative. Review of the MIP images confirms the above findings CTA HEAD FINDINGS Anterior circulation: Calcified plaques in the bilateral carotid siphons. Mild luminal irregularity of the right M1/MCA, may represent mild intracranial atherosclerotic disease without hemodynamically significant stenosis. No significant stenosis, proximal occlusion, aneurysm, or vascular malformation. Posterior circulation: No significant stenosis, proximal occlusion, aneurysm, or vascular malformation. Venous sinuses: As permitted by contrast timing, patent. Anatomic variants: Severely hypoplastic/aplastic left A1/ACA segment with a dominant right A1/ACA supplying both A2 segment. Prominent right posterior communicating artery. Review of the MIP images confirms the above findings IMPRESSION: 1. No large vessel occlusion, hemodynamically significant stenosis, or evidence of dissection. 2. Mild luminal irregularity of the right M1/MCA, may represent mild intracranial atherosclerotic disease without hemodynamically significant stenosis. Electronically Signed   By: Pedro Earls M.D.   On: 07/04/2020 13:18   CT HEAD WO CONTRAST  Result Date: 07/04/2020 CLINICAL DATA:  Right facial numbness. EXAM: CT HEAD WITHOUT CONTRAST TECHNIQUE: Contiguous axial images were obtained from the base of the skull through the vertex without intravenous contrast. COMPARISON:  January 26, 2019. FINDINGS: Brain: Mild chronic ischemic white matter disease is  noted. New rounded low density is noted in left caudate nucleus concerning for infarction of indeterminate age. No mass effect or midline shift is noted. Ventricular size is within normal limits. There is no evidence of hemorrhage or mass lesion. Vascular: No hyperdense vessel or unexpected calcification. Skull: Normal. Negative for fracture or focal lesion. Sinuses/Orbits: No acute finding. Other: None. IMPRESSION: New rounded low density is noted in left caudate nucleus concerning for infarction of indeterminate age. MRI is recommended for further evaluation. Electronically Signed   By: Marijo Conception M.D.   On: 07/04/2020 11:42   CT Angio Neck W and/or Wo Contrast  Result Date: 07/04/2020 CLINICAL DATA:  Neuro  deficit, acute, stroke suspected. EXAM: CT ANGIOGRAPHY HEAD AND NECK TECHNIQUE: Multidetector CT imaging of the head and neck was performed using the standard protocol during bolus administration of intravenous contrast. Multiplanar CT image reconstructions and MIPs were obtained to evaluate the vascular anatomy. Carotid stenosis measurements (when applicable) are obtained utilizing NASCET criteria, using the distal internal carotid diameter as the denominator. CONTRAST:  44mL OMNIPAQUE IOHEXOL 350 MG/ML SOLN COMPARISON:  Head CT July 04, 2020. FINDINGS: CTA NECK FINDINGS Aortic arch: Standard branching. Imaged portion shows no evidence of aneurysm or dissection. Calcified plaques at the origin of the innominate and left subclavian artery without hemodynamically significant stenosis. Right carotid system: Mild atherosclerotic changes at the right carotid bifurcation. No evidence of dissection, stenosis (50% or greater) or occlusion. Left carotid system: No evidence of dissection, stenosis (50% or greater) or occlusion. Vertebral arteries: Right dominant. No evidence of dissection, stenosis (50% or greater) or occlusion. Skeleton: Negative. Other neck: Negative. Upper chest: Negative. Review of the  MIP images confirms the above findings CTA HEAD FINDINGS Anterior circulation: Calcified plaques in the bilateral carotid siphons. Mild luminal irregularity of the right M1/MCA, may represent mild intracranial atherosclerotic disease without hemodynamically significant stenosis. No significant stenosis, proximal occlusion, aneurysm, or vascular malformation. Posterior circulation: No significant stenosis, proximal occlusion, aneurysm, or vascular malformation. Venous sinuses: As permitted by contrast timing, patent. Anatomic variants: Severely hypoplastic/aplastic left A1/ACA segment with a dominant right A1/ACA supplying both A2 segment. Prominent right posterior communicating artery. Review of the MIP images confirms the above findings IMPRESSION: 1. No large vessel occlusion, hemodynamically significant stenosis, or evidence of dissection. 2. Mild luminal irregularity of the right M1/MCA, may represent mild intracranial atherosclerotic disease without hemodynamically significant stenosis. Electronically Signed   By: Pedro Earls M.D.   On: 07/04/2020 13:18    ____________________________________________   PROCEDURES and INTERVENTIONS  Procedure(s) performed (including Critical Care):  .1-3 Lead EKG Interpretation Performed by: Vladimir Crofts, MD Authorized by: Vladimir Crofts, MD     Interpretation: normal     ECG rate:  58   ECG rate assessment: normal     Rhythm: sinus bradycardia     Ectopy: none     Conduction: normal   .Critical Care Performed by: Vladimir Crofts, MD Authorized by: Vladimir Crofts, MD   Critical care provider statement:    Critical care time (minutes):  45   Critical care was necessary to treat or prevent imminent or life-threatening deterioration of the following conditions:  CNS failure or compromise   Critical care was time spent personally by me on the following activities:  Discussions with consultants, evaluation of patient's response to treatment,  examination of patient, ordering and performing treatments and interventions, ordering and review of laboratory studies, ordering and review of radiographic studies, pulse oximetry, re-evaluation of patient's condition, obtaining history from patient or surrogate and review of old charts    Medications  sodium chloride flush (NS) 0.9 % injection 3 mL (has no administration in time range)  aspirin chewable tablet 324 mg (has no administration in time range)  iohexol (OMNIPAQUE) 350 MG/ML injection 60 mL (60 mLs Intravenous Contrast Given 07/04/20 1241)    ____________________________________________   MDM / ED COURSE   70 year old woman presents to the ED with vision changes concerning for acute stroke and require medical admission. Normal vitals on room air. Exam with right-sided monocular vision loss to superior temporal and nasal visual fields, as well as right-sided dysmetria and mild facial droop concerning for  acute stroke. Outside of TPA window and stroke alert was not called due to this. CT head without contrast noted with age-indeterminate caudate hypodensity concerning for CVA, no evidence of acute ICH. Urgently discussed the case with neurology, and has evaluated the patient separately. We will add inflammatory markers to her blood work to assess for temporal arteritis. CTA without evidence of large vessel occlusion to necessitate mechanical thrombectomy. Discussed the case with ophthalmology as well due to her monocular vision loss, and they will come to the patient in the hospital. We will discussed the case with hospitalist medicine for further work-up and management.  Clinical Course as of 07/04/20 1331  Mon Jul 04, 2020  1220 I speak with Dr. Lorrin Goodell, neuro on call, we discussed patient's work-up and presentation.  He recommends stat CTA neck and head to assess for PCA occlusion that would be amenable to thrombectomy.  I discussed this with the nurse, Bill, and urged him to  acquire IV access ASAP. [DS]  9675 Neuro has seen the patient and recommending Optho eval as well. I paged them [DS]  1325 I speak with Dr. Wallace Going, who will come to the hospital to see the patient shortly [DS]    Clinical Course User Index [DS] Vladimir Crofts, MD    ____________________________________________   FINAL CLINICAL IMPRESSION(S) / ED DIAGNOSES  Final diagnoses:  Cerebrovascular accident (CVA), unspecified mechanism ([place])  Monocular vision loss  Acute nonintractable headache, unspecified headache type     ED Discharge Orders    None       Lavina Resor   Note:  This document was prepared using Dragon voice recognition software and may include unintentional dictation errors.   Vladimir Crofts, MD 07/04/20 [phone]

## 2020-07-04 NOTE — Consult Note (Signed)
NEUROLOGY CONSULTATION NOTE   Date of service: July 04, 2020 Patient Name: Sarah Barnes MRN:  [id number] DOB:  03-19-1951 Reason for consult: "Stroke code" _ _ _   _ __   _ __ _ _  __ __   _ __   __ _  History of Present Illness  Sarah Barnes is a 70 y.o. female with PMH significant for Smoker 1-1.5 ppds for several decades, EtOh use, COPD, GAD, HTN, HLD, mild aortic stenosis who presents with acute onset R eye vision deficit and R facial numbness.  She took a nap at 2030 on 07/03/20 and woke up at 2230 with R eye vision deficit, R facial numbness and dull ache around her R eye and feeling like her eye was swollen. She did not think too much about it and went to bed and woke up the next day and her symptoms were persistent. She endorses a mild 3/10 headache behind her R eye, no jaw claudication.  Workup here with CTH without contrast with no acute abnormality, CT Angio with no LVO.  LKW: 2030 on 07/03/20. TPA: outside the window Thrombectomy: No LVO.  NIHSS components Score: Comment  1a Level of Conscious 0$RemoveBefor'[x]'fYIsRlNeYNcD$  '[]'$  2$R'[]'gO$  '[]'$      1b LOC Questions 0$RemoveBefore'[x]'aDRivnVjnCHrF$  '[]'$  2$R'[]'Vx$       1c LOC Commands 0$RemoveBefor'[x]'GIuYduDSSgxN$  '[]'$  2$R'[]'uL$       2 Best Gaze 0$RemoveBe'[x]'lJRGdwolb$  '[]'$  2$R'[]'Dx$       3 Visual 0$RemoveB'[]'WYpFRLAo$  '[]'$  2$R'[x]'XX$  '[]'$      4 Facial Palsy 0$RemoveBefor'[x]'bSbKytjGSuHz$  '[]'$  2$R'[]'ZC$  '[]'$      5a Motor Arm - left 0$Remov'[x]'NtxrYX$  '[]'$  2$R'[]'Ie$  '[]'$  4$R'[]'Xw$  U'[]'$    5b Motor Arm - Right 0$Remove'[x]'PGomWdE$  '[]'$  2$R'[]'jS$  '[]'$  4$R'[]'zy$  U'[]'$    6a Motor Leg - Left 0$Remov'[x]'FzKbTb$  '[]'$  2$R'[]'xj$  '[]'$  4$R'[]'Ua$  U'[]'$    6b Motor Leg - Right 0$Remove'[x]'wyLxiUW$  '[]'$  2$R'[]'ln$  '[]'$  4$R'[]'Nl$  U'[]'$    7 Limb Ataxia 0$RemoveBefo'[x]'KEHuCQNqYCN$  '[]'$  2$R'[]'Yh$  '[]'$  UN$R'[]'ki$     8 Sensory 0$RemoveBe'[]'lfOHeByuw$  '[x]'$  2$Re'[]'PYh$  U'[]'$      9 Best Language 0$RemoveBefore'[x]'AwdOebhozGEGW$  '[]'$  2$R'[]'ak$  '[]'$      10 Dysarthria 0$RemoveBefore'[x]'HRKrjQdpkLKwO$  '[]'$  2$R'[]'rL$  U'[]'$      11 Extinct. and Inattention 0$RemoveBeforeDE'[x]'IqQocTDsJHGNiZc$  '[]'$  2$R'[]'qv$       TOTAL: 3      ROS   Constitutional Denies weight loss, fever and chills.   HEENT Endorses changes in vision as above and is hard of hearing.   Respiratory Denies SOB and cough.   CV Denies palpitations and CP   GI Denies abdominal pain, nausea, vomiting and diarrhea.   GU Denies dysuria and urinary  frequency.   MSK Denies myalgia and joint pain.   Skin Denies rash and pruritus.   Neurological Denies headache and syncope.   Psychiatric Denies recent changes in mood. Denies anxiety and depression.    Past History   Past Medical History:  Diagnosis Date  . Agoraphobia with panic disorder   . Anemia   . Arthritis    "all over" (08/13/2016)  . Chronic bronchitis ([place])   . COPD (chronic obstructive pulmonary disease) ([place])   . Depression   . Dyspnea    occ  . Dysrhythmia    palpitations occ due to anxiety  . ETOH abuse   . GAD (generalized anxiety disorder)   . GERD (gastroesophageal reflux disease)   . Headache    "daily til I got glasses; now have headache once in awhile" (08/13/2016)  . Hepatitis 1960   "isolated &  hospitalized for 2 weeks; don't know which kind of hepatitis"   .  Hernia, hiatal   . High cholesterol   . Hypertension   . Hypertrophic obstructive cardiomyopathy ([place])   . Mild aortic stenosis   . Pneumonia    "once or twice" (08/13/2016)  . PONV (postoperative nausea and vomiting)   . Sciatica    Past Surgical History:  Procedure Laterality Date  . ANAL FISTULECTOMY  1970s X 3  . BACK SURGERY    . HERNIA REPAIR    . KNEE ARTHROSCOPY Right   . LAPAROSCOPIC CHOLECYSTECTOMY    . LAPAROSCOPIC INCISIONAL / UMBILICAL / VENTRAL HERNIA REPAIR  08/13/2016   VHR w/mesh  . LUMBAR DISC SURGERY     "herniated disc"  . NASAL FRACTURE SURGERY  1970s X 2  . RIGHT/LEFT HEART CATH AND CORONARY ANGIOGRAPHY N/A 12/27/2016   Procedure: RIGHT/LEFT HEART CATH AND CORONARY ANGIOGRAPHY;  Surgeon: Leonie Man, MD;  Location: [facility]AS[facility]: Mild-moderate pulmonary hypertension.  Hyperdynamic ventricle noted.  EF 55-65% -hyperdynamic (unable to measure LVOT gradient).  Mildly elevated very tortuous but angiographically normal coronary arteries, suggesting hypertensive heart disease  . SHOULDER ARTHROSCOPY WITH ROTATOR CUFF REPAIR Right 1990s?  . TONSILLECTOMY  1951  .  TRANSTHORACIC ECHOCARDIOGRAM  12/2016    EF 60-65% with dynamic outflow tract obstruction at rest. Peak gradient 52 mmHg consistent with HOCM.  GRII DD area. Aortic sclerosis but no stenosis. Systolic anterior motion of mitral valve chordae. Mild mitral stenosis. Moderate LA dilation and mild RA dilation. Peak PA pressures 35 mmHg.  Marland Kitchen VENTRAL HERNIA REPAIR N/A 08/13/2016   Procedure: LAPAROSCOPIC VENTRAL HERNIA REPAIR WITH MESH;  Surgeon: Judeth Horn, MD;  Location: [facility] OR;  Service: General;  Laterality: N/A;   Family History  Problem Relation Age of Onset  . Diabetes Mother   . Hyperlipidemia Mother   . Hypertension Mother   . Heart attack Mother 79  . Angina Mother        chronic problem  . Hypertension Father   . Stroke Father   . Cancer Brother    Social History   Socioeconomic History  . Marital status: Single    Spouse name: Not on file  . Number of children: Not on file  . Years of education: Not on file  . Highest education level: Not on file  Occupational History  . Not on file  Tobacco Use  . Smoking status: Current Every Day Smoker    Packs/day: 1.50    Years: 49.00    Pack years: 73.50    Types: Cigarettes  . Smokeless tobacco: Never Used  Substance and Sexual Activity  . Alcohol use: Yes    Alcohol/week: 84.0 standard drinks    Types: 36 Cans of beer, 48 Shots of liquor per week    Comment: occ  . Drug use: Not Currently    Frequency: 1.0 times per week    Types: Marijuana, Cocaine    Comment: Cocaine + March 2017  . Sexual activity: Not Currently  Other Topics Concern  . Not on file  Social History Narrative   Shamiracle has generalized anxiety disorder and clearcut Agoraphobia.   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    She currently lives with her sister.   Currently denies substance abuse beyond Tobacco (1-1/2 PPD) & EtOH (beer, vodka) - not ready to  discuss quitting.   Social Determinants of Health   Financial  Resource Strain: Not on file  Food Insecurity: Not on file  Transportation Needs: Not on file  Physical Activity: Not on file  Stress: Not on file  Social Connections: Not on file   No Known Allergies  Medications  (Not in a hospital admission)    Vitals   Vitals:   07/04/20 1107  BP: 124/75  Pulse: (!) 55  Resp: 18  Temp: 98 F (36.7 C)  TempSrc: Oral  SpO2: 97%  Weight: 77.2 kg     Body mass index is 30.15 kg/m.  Physical Exam   General: Laying comfortably in bed; in no acute distress. HENT: Normal oropharynx and mucosa. Normal external appearance of ears and nose.  Neck: Supple, no pain or tenderness  CV: No JVD. No peripheral edema.  Pulmonary: Symmetric Chest rise. Normal respiratory effort.  Abdomen: Soft to touch, non-tender.  Ext: No cyanosis, edema, or deformity  Skin: No rash. Normal palpation of skin.   Musculoskeletal: Normal digits and nails by inspection. No clubbing.   Neurologic Examination  Mental status/Cognition: Alert, oriented to self, place, month and year, good attention.  Speech/language: Fluent, comprehension intact, object naming intact, repetition intact.  Cranial nerves:   CN II Pupils equal and reactive to light, R mono-occular superior temporal and nasal vision loss.   CN III,IV,VI EOM intact, no gaze preference or deviation, no nystagmus    CN V normal sensation in V1, V2, and V3 segments bilaterally    CN VII no asymmetry, no nasolabial fold flattening    CN VIII normal hearing to speech   CN IX & X normal palatal elevation, no uvular deviation   CN XI 5/5 head turn and 5/5 shoulder shrug bilaterally   CN XII midline tongue protrusion   Motor:  Muscle bulk: normal, tone normal, pronator drift none tremor none Mvmt Root Nerve  Muscle Right Left Comments  SA C5/6 Ax Deltoid 5 5   EF C5/6 Mc Biceps 5 5   EE C6/7/8 Rad Triceps 5 5   WF C6/7 Med FCR 5 5   WE C7/8  PIN ECU 5 5   F Ab C8/T1 U ADM/FDI 5 5   HF L1/2/3 Fem Illopsoas 5 4+ (reports mild weakness due to hx of sciatica)  KE L2/3/4 Fem Quad     DF L4/5 D Peron Tib Ant 5 5   PF S1/2 Tibial Grc/Sol 5 5    Reflexes:  Right Left Comments  Pectoralis      Biceps (C5/6) 2 2   Brachioradialis (C5/6) 2 2    Triceps (C6/7) 2 2    Patellar (L3/4) 2 2    Achilles (S1)      Hoffman      Plantar     Jaw jerk    Sensation:  Light touch Mildly decreased to touch in R face.   Pin prick    Temperature    Vibration   Proprioception    Coordination/Complex Motor:  - Finger to Nose intact BL - Heel to shin intact BL - Rapid alternating movement is normal - Gait: Deferred.  Labs   CBC:  Recent Labs  Lab 07/04/20 1119  WBC 8.9  NEUTROABS 4.0  HGB 14.3  HCT 41.8  MCV 91.9  PLT 295    Basic Metabolic Panel:  Lab Results  Component Value Date   NA 139 07/04/2020   K 4.1 07/04/2020   CO2 27 07/04/2020   GLUCOSE 97 07/04/2020   BUN 27 (H) 07/04/2020  CREATININE 1.48 (H) 07/04/2020   CALCIUM 9.4 07/04/2020   GFRNONAA 38 (L) 07/04/2020   GFRAA 35 (L) 11/20/2019   Lipid Panel:  Lab Results  Component Value Date   LDLCALC 123 (H) 11/20/2019   HgbA1c:  Lab Results  Component Value Date   HGBA1C 5.0 12/23/2014   Urine Drug Screen:     Component Value Date/Time   LABOPIA NONE DETECTED 07/06/2015 1848   COCAINSCRNUR NONE DETECTED 07/06/2015 1848   LABBENZ POSITIVE (A) 07/06/2015 1848   AMPHETMU NONE DETECTED 07/06/2015 1848   THCU NONE DETECTED 07/06/2015 1848   LABBARB NONE DETECTED 07/06/2015 1848    Alcohol Level     Component Value Date/Time   ETH <10 01/26/2019 0727    CT Head without contrast: Personally reviewed and CTH was negative for a large hypodensity concerning for a large territory infarct or hyperdensity concerning for an ICH  CT angio Head and Neck with contrast: Personally reviewed and is negative for an LVO.  MRI Brain pending.  Impression    Sarah Barnes is a 70 y.o. female with PMH significant for Smoker 1-1.5 ppds for several decades, EtOh use, COPD, GAD, HTN, HLD, mild aortic stenosis who presents with acute onset mono-occular R eye vision loss in superior temporal and superior nasal quadrant and R facial numbness.  Exam is most suggestive of a deficit anterior to the optic chiasm. However, this is unlikely to explain the facial numbness. It mayu be that these are 2 different events.  Differential includes CRAO/BRAO, Vitreous hemorrhage, NAEON, Retinal detachment, GCA, less likely optic neuritis given that she has no pain with eye movement.  Recommendations  - MRI Brain without contrast - Recommend ophthalmology consult for eye exam. - Recommend ESR and CRP. - Recommend Stroke workup if ophthalmology exam is concerning for CRAO or BRAO. ______________________________________________________________________   Thank you for the opportunity to take part in the care of this patient. If you have any further questions, please contact the neurology consultation attending.  Signed,  [place] Pager Number [phone] _ _ _   _ __   _ __ _ _  __ __   _ __   __ _

## 2020-07-04 NOTE — Consult Note (Signed)
Reason for Consult:loss of vision R eye Referring Physician: [name] is an 70 y.o. female.  Chief complaint: loss of upper vision OD Stroke [facility])  HPI: 70 yo WF ho HTN, COPD presents with <24 hour vision loss superiorly OD.  10:30 PM last night went to bathroom and noticed darkness throughout superior VF OD.  Woke this AM with no improvement.  Came to ED and was admitted with possible CVA.  Denies HA or scalp tenderness but feels "pressure" around OD. + smoker, +etOH.  Ho "mini-strokes".  REcent (2 month) episode of transient visual loss Left eye  Past Ocular hx: presbyopia, no surgery Fam ocular hx- none  Past Medical History:  Diagnosis Date  . Agoraphobia with panic disorder   . Anemia   . Arthritis    "all over" (08/13/2016)  . Chronic bronchitis ([place])   . COPD (chronic obstructive pulmonary disease) ([place])   . Depression   . Dyspnea    occ  . Dysrhythmia    palpitations occ due to anxiety  . ETOH abuse   . GAD (generalized anxiety disorder)   . GERD (gastroesophageal reflux disease)   . Headache    "daily til I got glasses; now have headache once in awhile" (08/13/2016)  . Hepatitis 1960   "isolated &  hospitalized for 2 weeks; don't know which kind of hepatitis"   . Hernia, hiatal   . High cholesterol   . Hypertension   . Hypertrophic obstructive cardiomyopathy ([place])   . Mild aortic stenosis   . Pneumonia    "once or twice" (08/13/2016)  . PONV (postoperative nausea and vomiting)   . Sciatica     ROS  Past Surgical History:  Procedure Laterality Date  . ANAL FISTULECTOMY  1970s X 3  . BACK SURGERY    . HERNIA REPAIR    . KNEE ARTHROSCOPY Right   . LAPAROSCOPIC CHOLECYSTECTOMY    . LAPAROSCOPIC INCISIONAL / UMBILICAL / VENTRAL HERNIA REPAIR  08/13/2016   VHR w/mesh  . LUMBAR DISC SURGERY     "herniated disc"  . NASAL FRACTURE SURGERY  1970s X 2  . RIGHT/LEFT HEART CATH AND CORONARY ANGIOGRAPHY N/A 12/27/2016   Procedure: RIGHT/LEFT HEART CATH  AND CORONARY ANGIOGRAPHY;  Surgeon: Leonie Man, MD;  Location: [place] INVASIVE CV LAB: Mild-moderate pulmonary hypertension.  Hyperdynamic ventricle noted.  EF 55-65% -hyperdynamic (unable to measure LVOT gradient).  Mildly elevated very tortuous but angiographically normal coronary arteries, suggesting hypertensive heart disease  . SHOULDER ARTHROSCOPY WITH ROTATOR CUFF REPAIR Right 1990s?  . TONSILLECTOMY  1951  . TRANSTHORACIC ECHOCARDIOGRAM  12/2016    EF 60-65% with dynamic outflow tract obstruction at rest. Peak gradient 52 mmHg consistent with HOCM.  GRII DD area. Aortic sclerosis but no stenosis. Systolic anterior motion of mitral valve chordae. Mild mitral stenosis. Moderate LA dilation and mild RA dilation. Peak PA pressures 35 mmHg.  Marland Kitchen VENTRAL HERNIA REPAIR N/A 08/13/2016   Procedure: LAPAROSCOPIC VENTRAL HERNIA REPAIR WITH MESH;  Surgeon: Judeth Horn, MD;  Location: [facility] OR;  Service: General;  Laterality: N/A;    Family History  Problem Relation Age of Onset  . Diabetes Mother   . Hyperlipidemia Mother   . Hypertension Mother   . Heart attack Mother 51  . Angina Mother        chronic problem  . Hypertension Father   . Stroke Father   . Cancer Brother     Social History:  reports that she has  been smoking cigarettes. She has a 73.50 pack-year smoking history. She has never used smokeless tobacco. She reports current alcohol use of about 84.0 standard drinks of alcohol per week. She reports previous drug use. Frequency: 1.00 time per week. Drugs: Marijuana and Cocaine.  Allergies: No Known Allergies  Medications:  Prior to Admission:  Medications Prior to Admission  Medication Sig Dispense Refill Last Dose  . aspirin EC 81 MG EC tablet Take 1 tablet (81 mg total) by mouth daily. 30 tablet 1 07/04/2020 at Unknown time  . busPIRone (BUSPAR) 15 MG tablet Take 1 tablet (15 mg total) by mouth 2 (two) times daily. 180 tablet 0 07/04/2020 at Unknown time  . carvedilol (COREG) 25 MG  tablet Take 25 mg by mouth 2 (two) times daily.   07/04/2020 at Unknown time  . celecoxib (CELEBREX) 200 MG capsule Take 1 capsule (200 mg total) by mouth daily. 90 capsule 1 07/04/2020 at Unknown time  . escitalopram (LEXAPRO) 10 MG tablet Take 1 tablet (10 mg total) by mouth daily. 90 tablet 1 07/04/2020 at Unknown time  . fenofibrate micronized (LOFIBRA) 134 MG capsule Take 1 capsule (134 mg total) by mouth daily before breakfast. NEEDS APPOINTMENT FOR FUTURE REFILLS 15 capsule 0 07/04/2020 at Unknown time  . folic acid (FOLVITE) 1 MG tablet Take 1 tablet (1 mg total) by mouth daily. 30 tablet 0 07/04/2020 at Unknown time  . furosemide (LASIX) 40 MG tablet Take 1 tablet (40 mg total) by mouth daily. 90 tablet 0 07/04/2020 at Unknown time  . gabapentin (NEURONTIN) 100 MG capsule Start taking 1 capsule po QPM. May titrate dosing up to TID as needed and as tolerated . 270 capsule 0 07/04/2020 at Unknown time  . hydrochlorothiazide (HYDRODIURIL) 25 MG tablet Take 25 mg by mouth daily.   07/04/2020 at Unknown time  . pantoprazole (PROTONIX) 40 MG tablet Take 1 tablet (40 mg total) by mouth daily. NEEDS APPOINTMENT FOR FUTURE REFILLS 90 tablet 1 07/04/2020 at Unknown time  . tiZANidine (ZANAFLEX) 2 MG tablet Take 1 tablet (2 mg total) by mouth every 8 (eight) hours as needed for muscle spasms. 270 tablet 1 07/04/2020 at Unknown time  . traZODone (DESYREL) 100 MG tablet TAKE 1 TABLET BY MOUTH EVERYDAY AT BEDTIME 90 tablet 0 07/04/2020 at Unknown time  . valsartan (DIOVAN) 160 MG tablet Take 160 mg by mouth daily.   07/04/2020 at Unknown time  . ergocalciferol (DRISDOL) 1.25 MG (50000 UT) capsule Take 1 capsule (50,000 Units total) by mouth once a week. (Patient not taking: No sig reported) 12 capsule 1 Not Taking at Unknown time  . Multiple Vitamin (MULTIVITAMIN WITH MINERALS) TABS tablet Take 1 tablet by mouth daily. (Patient not taking: Reported on 07/04/2020)   Not Taking at Unknown time  . nicotine (NICODERM CQ -  DOSED IN MG/24 HOURS) 21 mg/24hr patch Place 1 patch (21 mg total) onto the skin daily. 28 patch 0     Results for orders placed or performed during the hospital encounter of 07/04/20 (from the past 48 hour(s))  Protime-INR     Status: None   Collection Time: 07/04/20 11:19 AM  Result Value Ref Range   Prothrombin Time 12.3 11.4 - 15.2 seconds   INR 1.0 0.8 - 1.2    Comment: (NOTE) INR goal varies based on device and disease states. Performed at [facility], [address] [place], [place] [address]   APTT     Status: None   Collection Time:  07/04/20 11:19 AM  Result Value Ref Range   aPTT 31 24 - 36 seconds    Comment: Performed at [facility], [address] [place], [place] [address]  CBC     Status: None   Collection Time: 07/04/20 11:19 AM  Result Value Ref Range   WBC 8.9 4.0 - 10.5 K/uL   RBC 4.55 3.87 - 5.11 MIL/uL   Hemoglobin 14.3 12.0 - 15.0 g/dL   HCT 41.8 36.0 - 46.0 %   MCV 91.9 80.0 - 100.0 fL   MCH 31.4 26.0 - 34.0 pg   MCHC 34.2 30.0 - 36.0 g/dL   RDW 12.7 11.5 - 15.5 %   Platelets 282 150 - 400 K/uL   nRBC 0.0 0.0 - 0.2 %    Comment: Performed at [facility], [address] [place], [place] [address]  Differential     Status: None   Collection Time: 07/04/20 11:19 AM  Result Value Ref Range   Neutrophils Relative % 45 %   Neutro Abs 4.0 1.7 - 7.7 K/uL   Lymphocytes Relative 44 %   Lymphs Abs 3.9 0.7 - 4.0 K/uL   Monocytes Relative 8 %   Monocytes Absolute 0.7 0.1 - 1.0 K/uL   Eosinophils Relative 3 %   Eosinophils Absolute 0.3 0.0 - 0.5 K/uL   Basophils Relative 0 %   Basophils Absolute 0.0 0.0 - 0.1 K/uL   Immature Granulocytes 0 %   Abs Immature Granulocytes 0.02 0.00 - 0.07 K/uL    Comment: Performed at [facility], [address] [place], [place] [address]  Comprehensive metabolic panel     Status: Abnormal   Collection Time: 07/04/20 11:19 AM  Result Value Ref Range   Sodium 139 135 - 145 mmol/L    Potassium 4.1 3.5 - 5.1 mmol/L   Chloride 99 98 - 111 mmol/L   CO2 27 22 - 32 mmol/L   Glucose, Bld 97 70 - 99 mg/dL    Comment: Glucose reference range applies only to samples taken after fasting for at least 8 hours.   BUN 27 (H) 8 - 23 mg/dL   Creatinine, Ser 1.48 (H) 0.44 - 1.00 mg/dL   Calcium 9.4 8.9 - 10.3 mg/dL   Total Protein 7.3 6.5 - 8.1 g/dL   Albumin 4.5 3.5 - 5.0 g/dL   AST 19 15 - 41 U/L   ALT 14 0 - 44 U/L   Alkaline Phosphatase 40 38 - 126 U/L   Total Bilirubin 0.7 0.3 - 1.2 mg/dL   GFR, Estimated 38 (L) >60 mL/min    Comment: (NOTE) Calculated using the CKD-EPI Creatinine Equation (2021)    Anion gap 13 5 - 15    Comment: Performed at [facility], [address] [place], [place] [address]  Brain natriuretic peptide     Status: Abnormal   Collection Time: 07/04/20 11:19 AM  Result Value Ref Range   B Natriuretic Peptide 137.4 (H) 0.0 - 100.0 pg/mL    Comment: Performed at [facility], [address] [place], [place] [address]  Urine Drug Screen, Qualitative ([place] only)     Status: None   Collection Time: 07/04/20  1:35 PM  Result Value Ref Range   Tricyclic, Ur Screen NONE DETECTED NONE DETECTED   Amphetamines, Ur Screen NONE DETECTED NONE DETECTED   MDMA (Ecstasy)Ur Screen NONE DETECTED NONE DETECTED   Cocaine Metabolite,Ur [place] NONE DETECTED NONE DETECTED   Opiate, Ur Screen NONE DETECTED  NONE DETECTED   Phencyclidine (PCP) Ur S NONE DETECTED NONE DETECTED   Cannabinoid 50 Ng, Ur [place] NONE DETECTED NONE DETECTED   Barbiturates, Ur Screen NONE DETECTED NONE DETECTED   Benzodiazepine, Ur Scrn NONE DETECTED NONE DETECTED   Methadone Scn, Ur NONE DETECTED NONE DETECTED    Comment: (NOTE) Tricyclics + metabolites, urine    Cutoff 1000 ng/mL Amphetamines + metabolites, urine  Cutoff 1000 ng/mL MDMA (Ecstasy), urine              Cutoff 500 ng/mL Cocaine Metabolite, urine          Cutoff 300 ng/mL Opiate + metabolites, urine        Cutoff  300 ng/mL Phencyclidine (PCP), urine         Cutoff 25 ng/mL Cannabinoid, urine                 Cutoff 50 ng/mL Barbiturates + metabolites, urine  Cutoff 200 ng/mL Benzodiazepine, urine              Cutoff 200 ng/mL Methadone, urine                   Cutoff 300 ng/mL  The urine drug screen provides only a preliminary, unconfirmed analytical test result and should not be used for non-medical purposes. Clinical consideration and professional judgment should be applied to any positive drug screen result due to possible interfering substances. A more specific alternate chemical method must be used in order to obtain a confirmed analytical result. Gas chromatography / mass spectrometry (GC/MS) is the preferred confirm atory method. Performed at [facility], [address] [place], [place] [address]   Sedimentation rate     Status: None   Collection Time: 07/04/20  2:02 PM  Result Value Ref Range   Sed Rate 25 0 - 30 mm/hr    Comment: Performed at [facility], [address] [place], [place] [address]    CT Angio Head W or Wo Contrast  Result Date: 07/04/2020 CLINICAL DATA:  Neuro deficit, acute, stroke suspected. EXAM: CT ANGIOGRAPHY HEAD AND NECK TECHNIQUE: Multidetector CT imaging of the head and neck was performed using the standard protocol during bolus administration of intravenous contrast. Multiplanar CT image reconstructions and MIPs were obtained to evaluate the vascular anatomy. Carotid stenosis measurements (when applicable) are obtained utilizing NASCET criteria, using the distal internal carotid diameter as the denominator. CONTRAST:  8mL OMNIPAQUE IOHEXOL 350 MG/ML SOLN COMPARISON:  Head CT July 04, 2020. FINDINGS: CTA NECK FINDINGS Aortic arch: Standard branching. Imaged portion shows no evidence of aneurysm or dissection. Calcified plaques at the origin of the innominate and left subclavian artery without hemodynamically significant stenosis. Right  carotid system: Mild atherosclerotic changes at the right carotid bifurcation. No evidence of dissection, stenosis (50% or greater) or occlusion. Left carotid system: No evidence of dissection, stenosis (50% or greater) or occlusion. Vertebral arteries: Right dominant. No evidence of dissection, stenosis (50% or greater) or occlusion. Skeleton: Negative. Other neck: Negative. Upper chest: Negative. Review of the MIP images confirms the above findings CTA HEAD FINDINGS Anterior circulation: Calcified plaques in the bilateral carotid siphons. Mild luminal irregularity of the right M1/MCA, may represent mild intracranial atherosclerotic disease without hemodynamically significant stenosis. No significant stenosis, proximal occlusion, aneurysm, or vascular malformation. Posterior circulation: No significant stenosis, proximal occlusion, aneurysm, or vascular malformation. Venous sinuses: As permitted by contrast timing, patent. Anatomic variants: Severely hypoplastic/aplastic left A1/ACA segment with a dominant right A1/ACA supplying  both A2 segment. Prominent right posterior communicating artery. Review of the MIP images confirms the above findings IMPRESSION: 1. No large vessel occlusion, hemodynamically significant stenosis, or evidence of dissection. 2. Mild luminal irregularity of the right M1/MCA, may represent mild intracranial atherosclerotic disease without hemodynamically significant stenosis. Electronically Signed   By: Pedro Earls M.D.   On: 07/04/2020 13:18   CT HEAD WO CONTRAST  Result Date: 07/04/2020 CLINICAL DATA:  Right facial numbness. EXAM: CT HEAD WITHOUT CONTRAST TECHNIQUE: Contiguous axial images were obtained from the base of the skull through the vertex without intravenous contrast. COMPARISON:  January 26, 2019. FINDINGS: Brain: Mild chronic ischemic white matter disease is noted. New rounded low density is noted in left caudate nucleus concerning for infarction of  indeterminate age. No mass effect or midline shift is noted. Ventricular size is within normal limits. There is no evidence of hemorrhage or mass lesion. Vascular: No hyperdense vessel or unexpected calcification. Skull: Normal. Negative for fracture or focal lesion. Sinuses/Orbits: No acute finding. Other: None. IMPRESSION: New rounded low density is noted in left caudate nucleus concerning for infarction of indeterminate age. MRI is recommended for further evaluation. Electronically Signed   By: Marijo Conception M.D.   On: 07/04/2020 11:42   CT Angio Neck W and/or Wo Contrast  Result Date: 07/04/2020 CLINICAL DATA:  Neuro deficit, acute, stroke suspected. EXAM: CT ANGIOGRAPHY HEAD AND NECK TECHNIQUE: Multidetector CT imaging of the head and neck was performed using the standard protocol during bolus administration of intravenous contrast. Multiplanar CT image reconstructions and MIPs were obtained to evaluate the vascular anatomy. Carotid stenosis measurements (when applicable) are obtained utilizing NASCET criteria, using the distal internal carotid diameter as the denominator. CONTRAST:  22mL OMNIPAQUE IOHEXOL 350 MG/ML SOLN COMPARISON:  Head CT July 04, 2020. FINDINGS: CTA NECK FINDINGS Aortic arch: Standard branching. Imaged portion shows no evidence of aneurysm or dissection. Calcified plaques at the origin of the innominate and left subclavian artery without hemodynamically significant stenosis. Right carotid system: Mild atherosclerotic changes at the right carotid bifurcation. No evidence of dissection, stenosis (50% or greater) or occlusion. Left carotid system: No evidence of dissection, stenosis (50% or greater) or occlusion. Vertebral arteries: Right dominant. No evidence of dissection, stenosis (50% or greater) or occlusion. Skeleton: Negative. Other neck: Negative. Upper chest: Negative. Review of the MIP images confirms the above findings CTA HEAD FINDINGS Anterior circulation: Calcified  plaques in the bilateral carotid siphons. Mild luminal irregularity of the right M1/MCA, may represent mild intracranial atherosclerotic disease without hemodynamically significant stenosis. No significant stenosis, proximal occlusion, aneurysm, or vascular malformation. Posterior circulation: No significant stenosis, proximal occlusion, aneurysm, or vascular malformation. Venous sinuses: As permitted by contrast timing, patent. Anatomic variants: Severely hypoplastic/aplastic left A1/ACA segment with a dominant right A1/ACA supplying both A2 segment. Prominent right posterior communicating artery. Review of the MIP images confirms the above findings IMPRESSION: 1. No large vessel occlusion, hemodynamically significant stenosis, or evidence of dissection. 2. Mild luminal irregularity of the right M1/MCA, may represent mild intracranial atherosclerotic disease without hemodynamically significant stenosis. Electronically Signed   By: Pedro Earls M.D.   On: 07/04/2020 13:18   MR BRAIN WO CONTRAST  Result Date: 07/04/2020 CLINICAL DATA:  Right-sided vision changes EXAM: MRI HEAD WITHOUT CONTRAST TECHNIQUE: Multiplanar, multiecho pulse sequences of the brain and surrounding structures were obtained without intravenous contrast. COMPARISON:  Prior CT imaging FINDINGS: Brain: Small foci of diffusion hyperintensity involving the left caudate head in  the inferior left lentiform nucleus. Possible additional punctate focus within the left corona radiata (series 5, image 77). There is no intracranial hemorrhage. There is no intracranial mass or mass effect patchy and confluent areas of T2 hyperintensity in the supratentorial white matter are nonspecific but probably reflect mild to moderate chronic microvascular ischemic changes. Chronic small vessel infarct of the lateral left caudate head (finding identified on recent CT). There is no hydrocephalus or extra-axial fluid collection. Prominence of the  ventricles and sulci reflects generalized parenchymal volume loss. Vascular: Major vessel flow voids at the skull base are preserved. Skull and upper cervical spine: Normal marrow signal is preserved. Sinuses/Orbits: Paranasal sinuses are aerated. Orbits are unremarkable. Other: Sella is unremarkable.  Mastoid air cells are clear. IMPRESSION: Punctate acute to subacute infarcts of the left basal ganglia and central white matter. Chronic infarct of the left caudate. Mild to moderate chronic microvascular ischemic changes. Electronically Signed   By: Macy Mis M.D.   On: 07/04/2020 15:30    Blood pressure 93/64, pulse 61, temperature 98 F (36.7 C), temperature source Oral, resp. rate 18, weight 77.2 kg, SpO2 95 %.  Mental status: Alert and Oriented x 4  Visual Acuity:  20/40 OD  20/30 near   Pupils:  Equally round/ reactive to light.  + afferent pupillary defect OD.  Motility:  Full/ orthophoric  Visual Fields: Superior altitudinal defect OD   Full to confrontation OS  IOP:  nml to palp  External/ Lids/ Lashes:  Normal  Anterior Segment:  Conjunctiva:  Normal  OU  Cornea:  Normal  OU  Anterior Chamber: Normal  OU  Lens:   [place] cataracts OU  Posterior Segment: Dilated OU with 1% Tropicamide and 2.5% Phenylephrine  Vitreous -   PVD OD  Discs:   Normal c/d ratio, no pallor, no edema OU  Macula:  Inferior retinal edema and pallor splitting macula OD. NML OS  Vessels/ Periphery: Pallor Inf OD  Normal OS    Assessment/Plan: Branch retinal artery occlusion OD.  I agree with full stroke workup.  ESR was nml, platelets are nml, CRP pending.  Doubt GCA unless CRP markedly elevated.  I was examining with a 20 D lens at bedside (low magnification) so I did not see an embolus in the artery, although most of these BRAO would be embolic in nature. I advised the patient that the vision loss is usually permanent unless it starts to recover in the first few hours.  Recommend follow up at  [facility] in 1 month.  Sarah Barnes 07/04/2020, 6:16 PM

## 2020-07-04 NOTE — H&P (Signed)
History and Physical    Sarah Barnes:[id number] DOB: 1950/11/01 DOA: 07/04/2020  Referring MD/NP/PA:   PCP: [name], MD   Patient coming from:  The patient is coming from home.  At baseline, pt is independent for most of ADL.        Chief Complaint: right facial numbness and right eye blurry vision  HPI: Sarah Barnes is a 70 y.o. female with medical history significant of hypertension, hyperlipidemia, COPD, GERD, depression, sciatica, aortic stenosis, hypertrophic obstructive cardiomyopathy, hepatitis, CAD, alcohol abuse, tobacco abuse, dCHF, CKD 3, who presents with right facial numbness and blurry vision in the right eye.  Patient was last known normal at 20: 30 when she went to bed last night.  She states that she woke up at about 22:30 with right facial numbness and right eye blurry vision. She also has headache around her right eye, which is 3 out of 10 severity. Patient has mild dry cough, no shortness breath, chest pain, fever or chills.  Denies nausea vomiting, diarrhea, abdominal pain, symptoms of UTI.  Patient states that she had COVID-19 infection more than 1 month ago.  ED Course: pt was found to have WBC 8.9, pending COVID-19 PCR, INR 1.0, PTT 31, renal function close to baseline, temperature normal, blood pressure 124/75, heart rate 55, RR 18, oxygen saturation 97% on room air. CT Angio of head and neck has no LVO.  CT of head showed new rounded low density is noted in left caudate nucleus concerning for infarction of indeterminate age. Dr. Lorrin Goodell of neuro and Dr. Wallace Going of ophthalmology were consulted.  Review of Systems:   General: no fevers, chills, no body weight gain, has fatigue HEENT: no blurry vision, hearing changes or sore throat Respiratory: no dyspnea, has coughing, no wheezing CV: no chest pain, no palpitations GI: no nausea, vomiting, abdominal pain, diarrhea, constipation GU: no dysuria, burning on urination, increased urinary  frequency, hematuria  Ext: no leg edema Neuro: right facial numbness and right eye blurry vision Skin: no rash, no skin tear. MSK: No muscle spasm, no deformity, no limitation of range of movement in spin Heme: No easy bruising.  Travel history: No recent long distant travel.  Allergy: No Known Allergies  Past Medical History:  Diagnosis Date  . Agoraphobia with panic disorder   . Anemia   . Arthritis    "all over" (08/13/2016)  . Chronic bronchitis ([place])   . COPD (chronic obstructive pulmonary disease) ([place])   . Depression   . Dyspnea    occ  . Dysrhythmia    palpitations occ due to anxiety  . ETOH abuse   . GAD (generalized anxiety disorder)   . GERD (gastroesophageal reflux disease)   . Headache    "daily til I got glasses; now have headache once in awhile" (08/13/2016)  . Hepatitis 1960   "isolated &  hospitalized for 2 weeks; don't know which kind of hepatitis"   . Hernia, hiatal   . High cholesterol   . Hypertension   . Hypertrophic obstructive cardiomyopathy ([place])   . Mild aortic stenosis   . Pneumonia    "once or twice" (08/13/2016)  . PONV (postoperative nausea and vomiting)   . Sciatica     Past Surgical History:  Procedure Laterality Date  . ANAL FISTULECTOMY  1970s X 3  . BACK SURGERY    . HERNIA REPAIR    . KNEE ARTHROSCOPY Right   . LAPAROSCOPIC CHOLECYSTECTOMY    . LAPAROSCOPIC  INCISIONAL / UMBILICAL / VENTRAL HERNIA REPAIR  08/13/2016   VHR w/mesh  . LUMBAR DISC SURGERY     "herniated disc"  . NASAL FRACTURE SURGERY  1970s X 2  . RIGHT/LEFT HEART CATH AND CORONARY ANGIOGRAPHY N/A 12/27/2016   Procedure: RIGHT/LEFT HEART CATH AND CORONARY ANGIOGRAPHY;  Surgeon: Leonie Man, MD;  Location: [facility] INVASIVE CV LAB: Mild-moderate pulmonary hypertension.  Hyperdynamic ventricle noted.  EF 55-65% -hyperdynamic (unable to measure LVOT gradient).  Mildly elevated very tortuous but angiographically normal coronary arteries, suggesting hypertensive heart disease   . SHOULDER ARTHROSCOPY WITH ROTATOR CUFF REPAIR Right 1990s?  . TONSILLECTOMY  1951  . TRANSTHORACIC ECHOCARDIOGRAM  12/2016    EF 60-65% with dynamic outflow tract obstruction at rest. Peak gradient 52 mmHg consistent with HOCM.  GRII DD area. Aortic sclerosis but no stenosis. Systolic anterior motion of mitral valve chordae. Mild mitral stenosis. Moderate LA dilation and mild RA dilation. Peak PA pressures 35 mmHg.  Marland Kitchen VENTRAL HERNIA REPAIR N/A 08/13/2016   Procedure: LAPAROSCOPIC VENTRAL HERNIA REPAIR WITH MESH;  Surgeon: Judeth Horn, MD;  Location: [facility];  Service: General;  Laterality: N/A;    Social History:  reports that she has been smoking cigarettes. She has a 73.50 pack-year smoking history. She has never used smokeless tobacco. She reports current alcohol use of about 84.0 standard drinks of alcohol per week. She reports previous drug use. Frequency: 1.00 time per week. Drugs: Marijuana and Cocaine.  Family History:  Family History  Problem Relation Age of Onset  . Diabetes Mother   . Hyperlipidemia Mother   . Hypertension Mother   . Heart attack Mother 22  . Angina Mother        chronic problem  . Hypertension Father   . Stroke Father   . Cancer Brother      Prior to Admission medications   Medication Sig Start Date End Date Taking? Authorizing Provider  albuterol (VENTOLIN HFA) 108 (90 Base) MCG/ACT inhaler Inhale 2 puffs into the lungs every 6 (six) hours as needed for wheezing. Patient not taking: Reported on 05/03/2020    [provider]  aspirin EC 81 MG EC tablet Take 1 tablet (81 mg total) by mouth daily. 12/29/16   Barton Dubois, MD  busPIRone (BUSPAR) 15 MG tablet Take 1 tablet (15 mg total) by mouth 2 (two) times daily. 06/07/20   [name], MD  carvedilol (COREG) 25 MG tablet Take 25 mg by mouth 2 (two) times daily. 11/19/19   [provider]  celecoxib (CELEBREX) 200 MG capsule Take 1 capsule (200 mg total) by mouth daily. 05/03/20   Ronnell Freshwater, NP  ergocalciferol (DRISDOL) 1.25 MG (50000 UT) capsule Take 1 capsule (50,000 Units total) by mouth once a week. 05/31/20   [name], MD  escitalopram (LEXAPRO) 10 MG tablet Take 1 tablet (10 mg total) by mouth daily. 03/24/20   Ronnell Freshwater, NP  fenofibrate micronized (LOFIBRA) 134 MG capsule Take 1 capsule (134 mg total) by mouth daily before breakfast. NEEDS APPOINTMENT FOR FUTURE REFILLS 06/20/18   Leonie Man, MD  folic acid (FOLVITE) 1 MG tablet Take 1 tablet (1 mg total) by mouth daily. 01/28/19   Demetrios Loll, MD  furosemide (LASIX) 40 MG tablet Take 1 tablet (40 mg total) by mouth daily. 06/16/20   [name], MD  gabapentin (NEURONTIN) 100 MG capsule Start taking 1 capsule po QPM. May titrate dosing up to TID as needed and  as tolerated . 05/26/20   Ronnell Freshwater, NP  hydrochlorothiazide (HYDRODIURIL) 25 MG tablet Take 25 mg by mouth daily. 11/01/19   [provider]  Multiple Vitamin (MULTIVITAMIN WITH MINERALS) TABS tablet Take 1 tablet by mouth daily. 12/29/16   Barton Dubois, MD  nicotine (NICODERM CQ - DOSED IN MG/24 HOURS) 21 mg/24hr patch Place 1 patch (21 mg total) onto the skin daily. 12/29/16   Barton Dubois, MD  pantoprazole (PROTONIX) 40 MG tablet Take 1 tablet (40 mg total) by mouth daily. NEEDS APPOINTMENT FOR FUTURE REFILLS 04/04/20   Ronnell Freshwater, NP  prazosin (MINIPRESS) 2 MG capsule Take 1 capsule (2 mg total) by mouth daily. NEEDS APPOINTMENT FOR FUTURE REFILLS 06/20/18   Leonie Man, MD  thiamine 100 MG tablet Take 1 tablet (100 mg total) by mouth daily. 01/28/19   Demetrios Loll, MD  tiZANidine (ZANAFLEX) 2 MG tablet Take 1 tablet (2 mg total) by mouth every 8 (eight) hours as needed for muscle spasms. 04/20/20   [name], MD  traZODone (DESYREL) 100 MG tablet TAKE 1 TABLET BY MOUTH EVERYDAY AT BEDTIME 06/30/20   [name], MD    Physical Exam: Vitals:   07/04/20 1107 07/04/20 1705  BP: 124/75 93/64  Pulse: (!) 55 61  Resp:  18 18  Temp: 98 F (36.7 C)   TempSrc: Oral   SpO2: 97% 95%  Weight: 77.2 kg    General: Not in acute distress HEENT:       Eyes: PERRL, EOMI, no scleral icterus.       ENT: No discharge from the ears and nose, no pharynx injection, no tonsillar enlargement.        Neck: No JVD, no bruit, no mass felt. Heme: No neck lymph node enlargement. Cardiac: S1/S2, RRR, No murmurs, No gallops or rubs. Respiratory: No rales, wheezing, rhonchi or rubs. GI: Soft, nondistended, nontender, no rebound pain, no organomegaly, BS present. GU: No hematuria Ext: No pitting leg edema bilaterally. 1+DP/PT pulse bilaterally. Musculoskeletal: No joint deformities, No joint redness or warmth, no limitation of ROM in spin. Skin: No rashes.  Neuro: Alert, oriented X3, cranial nerves III-XII grossly intact, moves all extremities normally. Has eye superior temporal and nasal vision loss. Psych: Patient is not psychotic, no suicidal or hemocidal ideation.  Labs on Admission: I have personally reviewed following labs and imaging studies  CBC: Recent Labs  Lab 07/04/20 1119  WBC 8.9  NEUTROABS 4.0  HGB 14.3  HCT 41.8  MCV 91.9  PLT 195   Basic Metabolic Panel: Recent Labs  Lab 07/04/20 1119  NA 139  K 4.1  CL 99  CO2 27  GLUCOSE 97  BUN 27*  CREATININE 1.48*  CALCIUM 9.4   GFR: Estimated Creatinine Clearance: 34.3 mL/min (A) (by C-G formula based on SCr of 1.48 mg/dL (H)). Liver Function Tests: Recent Labs  Lab 07/04/20 1119  AST 19  ALT 14  ALKPHOS 40  BILITOT 0.7  PROT 7.3  ALBUMIN 4.5   No results for input(s): LIPASE, AMYLASE in the last 168 hours. No results for input(s): AMMONIA in the last 168 hours. Coagulation Profile: Recent Labs  Lab 07/04/20 1119  INR 1.0   Cardiac Enzymes: No results for input(s): CKTOTAL, CKMB, CKMBINDEX, TROPONINI in the last 168 hours. BNP (last 3 results) No results for input(s): PROBNP in the last 8760 hours. HbA1C: No results for input(s):  HGBA1C in the last 72 hours. CBG: No results for input(s): GLUCAP  in the last 168 hours. Lipid Profile: No results for input(s): CHOL, HDL, LDLCALC, TRIG, CHOLHDL, LDLDIRECT in the last 72 hours. Thyroid Function Tests: No results for input(s): TSH, T4TOTAL, FREET4, T3FREE, THYROIDAB in the last 72 hours. Anemia Panel: No results for input(s): VITAMINB12, FOLATE, FERRITIN, TIBC, IRON, RETICCTPCT in the last 72 hours. Urine analysis:    Component Value Date/Time   COLORURINE YELLOW (A) 01/26/2019 0812   APPEARANCEUR Clear 01/22/2020 1142   LABSPEC 1.008 01/26/2019 0812   PHURINE 6.0 01/26/2019 0812   GLUCOSEU Negative 01/22/2020 1142   HGBUR MODERATE (A) 01/26/2019 0812   BILIRUBINUR Negative 01/22/2020 1142   KETONESUR NEGATIVE 01/26/2019 0812   PROTEINUR Negative 01/22/2020 1142   PROTEINUR NEGATIVE 01/26/2019 0812   UROBILINOGEN 0.2 11/25/2014 0415   NITRITE Negative 01/22/2020 1142   NITRITE NEGATIVE 01/26/2019 0812   LEUKOCYTESUR 1+ (A) 01/22/2020 1142   LEUKOCYTESUR SMALL (A) 01/26/2019 0812   Sepsis Labs: $[email or web address]'[id number]$ (procalcitonin:4,lacticidven:4) )No results found for this or any previous visit (from the past 240 hour(s)).   Radiological Exams on Admission: CT Angio Head W or Wo Contrast  Result Date: 07/04/2020 CLINICAL DATA:  Neuro deficit, acute, stroke suspected. EXAM: CT ANGIOGRAPHY HEAD AND NECK TECHNIQUE: Multidetector CT imaging of the head and neck was performed using the standard protocol during bolus administration of intravenous contrast. Multiplanar CT image reconstructions and MIPs were obtained to evaluate the vascular anatomy. Carotid stenosis measurements (when applicable) are obtained utilizing NASCET criteria, using the distal internal carotid diameter as the denominator. CONTRAST:  64mL OMNIPAQUE IOHEXOL 350 MG/ML SOLN COMPARISON:  Head CT July 04, 2020. FINDINGS: CTA NECK FINDINGS Aortic arch: Standard branching. Imaged portion shows no evidence of  aneurysm or dissection. Calcified plaques at the origin of the innominate and left subclavian artery without hemodynamically significant stenosis. Right carotid system: Mild atherosclerotic changes at the right carotid bifurcation. No evidence of dissection, stenosis (50% or greater) or occlusion. Left carotid system: No evidence of dissection, stenosis (50% or greater) or occlusion. Vertebral arteries: Right dominant. No evidence of dissection, stenosis (50% or greater) or occlusion. Skeleton: Negative. Other neck: Negative. Upper chest: Negative. Review of the MIP images confirms the above findings CTA HEAD FINDINGS Anterior circulation: Calcified plaques in the bilateral carotid siphons. Mild luminal irregularity of the right M1/MCA, may represent mild intracranial atherosclerotic disease without hemodynamically significant stenosis. No significant stenosis, proximal occlusion, aneurysm, or vascular malformation. Posterior circulation: No significant stenosis, proximal occlusion, aneurysm, or vascular malformation. Venous sinuses: As permitted by contrast timing, patent. Anatomic variants: Severely hypoplastic/aplastic left A1/ACA segment with a dominant right A1/ACA supplying both A2 segment. Prominent right posterior communicating artery. Review of the MIP images confirms the above findings IMPRESSION: 1. No large vessel occlusion, hemodynamically significant stenosis, or evidence of dissection. 2. Mild luminal irregularity of the right M1/MCA, may represent mild intracranial atherosclerotic disease without hemodynamically significant stenosis. Electronically Signed   By: Pedro Earls M.D.   On: 07/04/2020 13:18   CT HEAD WO CONTRAST  Result Date: 07/04/2020 CLINICAL DATA:  Right facial numbness. EXAM: CT HEAD WITHOUT CONTRAST TECHNIQUE: Contiguous axial images were obtained from the base of the skull through the vertex without intravenous contrast. COMPARISON:  January 26, 2019. FINDINGS:  Brain: Mild chronic ischemic white matter disease is noted. New rounded low density is noted in left caudate nucleus concerning for infarction of indeterminate age. No mass effect or midline shift is noted. Ventricular size is within normal limits. There is no evidence of hemorrhage  or mass lesion. Vascular: No hyperdense vessel or unexpected calcification. Skull: Normal. Negative for fracture or focal lesion. Sinuses/Orbits: No acute finding. Other: None. IMPRESSION: New rounded low density is noted in left caudate nucleus concerning for infarction of indeterminate age. MRI is recommended for further evaluation. Electronically Signed   By: Marijo Conception M.D.   On: 07/04/2020 11:42   CT Angio Neck W and/or Wo Contrast  Result Date: 07/04/2020 CLINICAL DATA:  Neuro deficit, acute, stroke suspected. EXAM: CT ANGIOGRAPHY HEAD AND NECK TECHNIQUE: Multidetector CT imaging of the head and neck was performed using the standard protocol during bolus administration of intravenous contrast. Multiplanar CT image reconstructions and MIPs were obtained to evaluate the vascular anatomy. Carotid stenosis measurements (when applicable) are obtained utilizing NASCET criteria, using the distal internal carotid diameter as the denominator. CONTRAST:  57mL OMNIPAQUE IOHEXOL 350 MG/ML SOLN COMPARISON:  Head CT July 04, 2020. FINDINGS: CTA NECK FINDINGS Aortic arch: Standard branching. Imaged portion shows no evidence of aneurysm or dissection. Calcified plaques at the origin of the innominate and left subclavian artery without hemodynamically significant stenosis. Right carotid system: Mild atherosclerotic changes at the right carotid bifurcation. No evidence of dissection, stenosis (50% or greater) or occlusion. Left carotid system: No evidence of dissection, stenosis (50% or greater) or occlusion. Vertebral arteries: Right dominant. No evidence of dissection, stenosis (50% or greater) or occlusion. Skeleton: Negative. Other  neck: Negative. Upper chest: Negative. Review of the MIP images confirms the above findings CTA HEAD FINDINGS Anterior circulation: Calcified plaques in the bilateral carotid siphons. Mild luminal irregularity of the right M1/MCA, may represent mild intracranial atherosclerotic disease without hemodynamically significant stenosis. No significant stenosis, proximal occlusion, aneurysm, or vascular malformation. Posterior circulation: No significant stenosis, proximal occlusion, aneurysm, or vascular malformation. Venous sinuses: As permitted by contrast timing, patent. Anatomic variants: Severely hypoplastic/aplastic left A1/ACA segment with a dominant right A1/ACA supplying both A2 segment. Prominent right posterior communicating artery. Review of the MIP images confirms the above findings IMPRESSION: 1. No large vessel occlusion, hemodynamically significant stenosis, or evidence of dissection. 2. Mild luminal irregularity of the right M1/MCA, may represent mild intracranial atherosclerotic disease without hemodynamically significant stenosis. Electronically Signed   By: Pedro Earls M.D.   On: 07/04/2020 13:18   MR BRAIN WO CONTRAST  Result Date: 07/04/2020 CLINICAL DATA:  Right-sided vision changes EXAM: MRI HEAD WITHOUT CONTRAST TECHNIQUE: Multiplanar, multiecho pulse sequences of the brain and surrounding structures were obtained without intravenous contrast. COMPARISON:  Prior CT imaging FINDINGS: Brain: Small foci of diffusion hyperintensity involving the left caudate head in the inferior left lentiform nucleus. Possible additional punctate focus within the left corona radiata (series 5, image 77). There is no intracranial hemorrhage. There is no intracranial mass or mass effect patchy and confluent areas of T2 hyperintensity in the supratentorial white matter are nonspecific but probably reflect mild to moderate chronic microvascular ischemic changes. Chronic small vessel infarct of the  lateral left caudate head (finding identified on recent CT). There is no hydrocephalus or extra-axial fluid collection. Prominence of the ventricles and sulci reflects generalized parenchymal volume loss. Vascular: Major vessel flow voids at the skull base are preserved. Skull and upper cervical spine: Normal marrow signal is preserved. Sinuses/Orbits: Paranasal sinuses are aerated. Orbits are unremarkable. Other: Sella is unremarkable.  Mastoid air cells are clear. IMPRESSION: Punctate acute to subacute infarcts of the left basal ganglia and central white matter. Chronic infarct of the left caudate. Mild to moderate chronic microvascular  ischemic changes. Electronically Signed   By: Macy Mis M.D.   On: 07/04/2020 15:30     EKG: I have personally reviewed.  Sinus rhythm, QTC 425, J-point elevation diffusely  Assessment/Plan Principal Problem:   Stroke [facility]) Active Problems:   GERD (gastroesophageal reflux disease)   Essential hypertension   Major depressive disorder, recurrent severe without psychotic features (HCC)   Hypertrophic obstructive cardiomyopathy (HCC)   COPD (chronic obstructive pulmonary disease) (HCC)   ETOH abuse   HLD (hyperlipidemia)   CAD (coronary artery disease)   Tobacco abuse   Chronic diastolic CHF (congestive heart failure) (HCC)   CKD (chronic kidney disease), stage IIIb   Possible stroke ([place]): CT of head showed new rounded low density is noted in left caudate nucleus concerning for infarction of indeterminate age. CTA negative for LOV. Per Dr. Lorrin Goodell of neuro, " differential includes CRAO/BRAO, Vitreous hemorrhage, NAEON, Retinal detachment, GCA, less likely optic neuritis given that she has no pain with eye movement".  -Placed on MedSurg bed for observation - will follow up Neurology's Recs as follows:  - MRI Brain without contrast  - Recommend ophthalmology consult for eye exam -->Dr. Wallace Going is consulted by EDP  - Recommend ESR and CRP.  -  Recommend Stroke workup if ophthalmology exam is concerning for CRAO or BRAO.  - pt received 324 mg ASA in ED --> will continue 81 mg daily -Continue fenofibrate, Lipitor - fasting lipid panel and HbA1c  - swallowing screen. If fails, will get SLP - Check UDS  - PT/OT consult  GERD (gastroesophageal reflux disease) -Protonix  Essential hypertension -IV hydralazine as needed for SBP > 200 -Coreg, irbesartan -Hold Lasix and HCTZ in the setting of possible stroke to avoid dehydration  Major depressive disorder, recurrent severe without psychotic features ([place]) -Continue home medication  Tobacco abuse and Alcohol abuse: -Nicotine patch -CIWA protocol  Hypertrophic obstructive cardiomyopathy and chronic diastolic CHF: 2D echo on 0/93/8182 showed normal EF.  Patient does not have leg edema or JVD.  CHF seem to be compensated. -Hold Lasix -Check BNP  COPD (chronic obstructive pulmonary disease) (HCC): Stable -Bronchodilators  HLD (hyperlipidemia) -Fenofibrate and Lipitor  CAD (coronary artery disease) -Aspirin, fenofibrate, Lipitor  CKD (chronic kidney disease), stage IIIb: Renal function stable close to baseline -Follow-up by BMP     DVT ppx: SQ Lovenox Code Status: Full code Family Communication: not done, no family member is at bed side.   Disposition Plan:  Anticipate discharge back to previous environment Consults called:  Dr. Lorrin Goodell of neuro and Dr. Wallace Going of ophthalmology Admission status and Level of care: Med-Surg:    Med-surg bed for obs   Status is: Observation  The patient remains OBS appropriate and will d/c before 2 midnights.  Dispo: The patient is from: Home              Anticipated d/c is to: Home              Anticipated d/c date is: 1 day              Patient currently is not medically stable to d/c.   Difficult to place patient No             Date of Service 07/04/2020    [facility]   If 7PM-7AM, please  contact night-coverage [email or web address] 07/04/2020, 6:11 PM

## 2020-07-04 NOTE — Evaluation (Addendum)
Clinical/Bedside Swallow Evaluation Patient Details  Name: Sarah Barnes MRN: [id number] Date of Birth: 1950/12/26  Today's Date: 07/04/2020 Time: SLP Start Time (ACUTE ONLY): 83 SLP Stop Time (ACUTE ONLY): 1630 SLP Time Calculation (min) (ACUTE ONLY): 60 min  Past Medical History:  Past Medical History:  Diagnosis Date  . Agoraphobia with panic disorder   . Anemia   . Arthritis    "all over" (08/13/2016)  . Chronic bronchitis ([place])   . COPD (chronic obstructive pulmonary disease) ([place])   . Depression   . Dyspnea    occ  . Dysrhythmia    palpitations occ due to anxiety  . ETOH abuse   . GAD (generalized anxiety disorder)   . GERD (gastroesophageal reflux disease)   . Headache    "daily til I got glasses; now have headache once in awhile" (08/13/2016)  . Hepatitis 1960   "isolated &  hospitalized for 2 weeks; don't know which kind of hepatitis"   . Hernia, hiatal   . High cholesterol   . Hypertension   . Hypertrophic obstructive cardiomyopathy ([place])   . Mild aortic stenosis   . Pneumonia    "once or twice" (08/13/2016)  . PONV (postoperative nausea and vomiting)   . Sciatica    Past Surgical History:  Past Surgical History:  Procedure Laterality Date  . ANAL FISTULECTOMY  1970s X 3  . BACK SURGERY    . HERNIA REPAIR    . KNEE ARTHROSCOPY Right   . LAPAROSCOPIC CHOLECYSTECTOMY    . LAPAROSCOPIC INCISIONAL / UMBILICAL / VENTRAL HERNIA REPAIR  08/13/2016   VHR w/mesh  . LUMBAR DISC SURGERY     "herniated disc"  . NASAL FRACTURE SURGERY  1970s X 2  . RIGHT/LEFT HEART CATH AND CORONARY ANGIOGRAPHY N/A 12/27/2016   Procedure: RIGHT/LEFT HEART CATH AND CORONARY ANGIOGRAPHY;  Surgeon: Leonie Man, MD;  Location: [facility] INVASIVE CV LAB: Mild-moderate pulmonary hypertension.  Hyperdynamic ventricle noted.  EF 55-65% -hyperdynamic (unable to measure LVOT gradient).  Mildly elevated very tortuous but angiographically normal coronary arteries, suggesting hypertensive heart  disease  . SHOULDER ARTHROSCOPY WITH ROTATOR CUFF REPAIR Right 1990s?  . TONSILLECTOMY  1951  . TRANSTHORACIC ECHOCARDIOGRAM  12/2016    EF 60-65% with dynamic outflow tract obstruction at rest. Peak gradient 52 mmHg consistent with HOCM.  GRII DD area. Aortic sclerosis but no stenosis. Systolic anterior motion of mitral valve chordae. Mild mitral stenosis. Moderate LA dilation and mild RA dilation. Peak PA pressures 35 mmHg.  Marland Kitchen VENTRAL HERNIA REPAIR N/A 08/13/2016   Procedure: LAPAROSCOPIC VENTRAL HERNIA REPAIR WITH MESH;  Surgeon: Judeth Horn, MD;  Location: [facility];  Service: General;  Laterality: N/A;   HPI:  Pt is a 70 y.o. femalewho presents to the ED for evaluation of right sided facial numbness(moreso around the eye) and vision loss.  Chart review indicates significant for GERD, hiatal hernia, Smoker 1-1.5 ppds for several decades, EtOh use, COPD, GAD, HTN, HLD, mild aortic stenosis.  She reports a history of TIAs without history of deficits or full strokes.  She lives alone at home w/ her dog.  MRI: Punctate acute to subacute infarcts of the left basal ganglia and  central white matter. Chronic infarct of the left caudate. Mild to moderate chronic microvascular ischemic changes.  Pt endorsed Vision deficits - new field cut in R eye; Baseline vision deficits in Left eye. Currently cannot read.   Assessment / Plan / Recommendation Clinical Impression  Pt appears to present w/  adequate oropharyngeal phase swallow function w/ No overt oropharyngeal phase dysphagia noted, No neuromuscular deficits noted. Pt consumed po trials w/ No immediate, overt clinical s/s of aspiration during po trials. Pt appears at reduced risk for aspiration following general aspiration precautions. No Straws were used/recommended for use currently during acute stay. During po trials, pt consumed all consistencies w/ no overt coughing, decline in vocal quality, or change in respiratory presentation during/post trials. No  decline in O2 sats(98%). Oral phase appeared [facility] w/ timely bolus management, mastication, and control of bolus propulsion for A-P transfer for swallowing. Oral clearing achieved w/ all trial consistencies. OM Exam appeared grossly [facility] w/ no unilateral lingual weakness noted, trace R upper labial decreased tone(?). Speech Clear, vocal quality gravely(smoker, Reflux premorbid). Pt fed self w/ setup support -- has Vision field cut and cannot read but was able to fix coffee independently and feed self w/ utensil. Recommend a Regular consistency diet w/ well-Cut meats, moistened foods; Thin liquids VIA CUP for better oral control when drinking. Recommend general aspiration precautions, Pills WHOLE in Puree for safer, easier swallowing IF any difficulty swallowing w/ liquids(NSG aware too). Education given on Pills in Puree; food consistencies and easy to eat options; general aspiration precautions. NSG to reconsult if any new needs arise. Pt and NSG agreed. SLP Visit Diagnosis: Dysphagia, unspecified (R13.10)    Aspiration Risk   (reduced following precautions)    Diet Recommendation  Regular diet w/ meats/foods well-Cut and moistened; Thin liquids VIA CUP.  No straws.  General aspiration and GERD/REFLUX precautions.  Tray setup support d/t Vision deficits currently.   Medication Administration: Whole meds with puree (IF any difficulty when swallowing w/ liquids)    Other  Recommendations Recommended Consults: Consider GI evaluation (ongoing management) Oral Care Recommendations: Oral care BID;Oral care before and after PO;Patient independent with oral care Other Recommendations:  (n/a)   Follow up Recommendations None      Frequency and Duration  (n/a)   (n/a)       Prognosis Prognosis for Safe Diet Advancement: Good      Swallow Study   General Date of Onset: 07/04/20 HPI: Pt is a 70 y.o. femalewho presents to the ED for evaluation of right sided facial numbness(moreso around the eye) and  vision loss.  Chart review indicates significant for GERD, hiatal hernia, Smoker 1-1.5 ppds for several decades, EtOh use, COPD, GAD, HTN, HLD, mild aortic stenosis.  She reports a history of TIAs without history of deficits or full strokes.  She lives alone at home w/ her dog.  MRI: Punctate acute to subacute infarcts of the left basal ganglia and  central white matter. Chronic infarct of the left caudate. Mild to moderate chronic microvascular ischemic changes. Type of Study: Bedside Swallow Evaluation Previous Swallow Assessment: none Diet Prior to this Study: Regular;Thin liquids (at home but NPO currently) Temperature Spikes Noted: No (wbc 8.9) Respiratory Status: Room air History of Recent Intubation: No Behavior/Cognition: Alert;Cooperative;Pleasant mood Oral Cavity Assessment: Within Functional Limits;Dry (min) Oral Care Completed by SLP: Yes Oral Cavity - Dentition: Adequate natural dentition;Missing dentition (few - stated she was having teeth pulled "soon") Vision: Impaired for self-feeding (vision cut -- cannot read) Self-Feeding Abilities: Able to feed self;Needs assist;Needs set up Patient Positioning: Upright in bed (assisted) Baseline Vocal Quality: Normal (min gravely - smoker, Reflux) Volitional Cough: Strong Volitional Swallow: Able to elicit    Oral/Motor/Sensory Function Overall Oral Motor/Sensory Function: Within functional limits (trace decreased labial tone in R upper lip)  Ice Chips Ice chips: Within functional limits Presentation: Spoon (fed; 3 trials)   Thin Liquid Thin Liquid: Within functional limits Presentation: Cup;Self Fed (~4 ozs total) Other Comments: water, coffee    Nectar Thick Nectar Thick Liquid: Not tested   Honey Thick Honey Thick Liquid: Not tested   Puree Puree: Within functional limits Presentation: Spoon;Self Fed (4 ozs)   Solid     Solid: Within functional limits Presentation: Self Fed;Spoon (6 trials) Other Comments: moistened        Orinda Kenner, [place], CCC-SLP Speech Language Pathologist Rehab Services [phone] [facility] 07/04/2020,4:38 PM

## 2020-07-04 NOTE — ED Triage Notes (Signed)
First Nurse Note:  Arrives with c/o facial numbness and blurred vision since last night at 2230.  Patient arrives AAOx3.  Skin warm and dry.  MAE equally and strong. NAD

## 2020-07-05 ENCOUNTER — Telehealth: Payer: Self-pay

## 2020-07-05 DIAGNOSIS — I10 Essential (primary) hypertension: Secondary | ICD-10-CM | POA: Diagnosis not present

## 2020-07-05 DIAGNOSIS — I639 Cerebral infarction, unspecified: Secondary | ICD-10-CM | POA: Diagnosis not present

## 2020-07-05 LAB — LIPID PANEL
Cholesterol: 174 mg/dL (ref 0–200)
HDL: 37 mg/dL — ABNORMAL LOW (ref >40)
LDL Cholesterol: 90 mg/dL (ref 0–99)
Total CHOL/HDL Ratio: 4.7 RATIO
Triglycerides: 237 mg/dL — ABNORMAL HIGH (ref <150)
VLDL: 47 mg/dL — ABNORMAL HIGH (ref 0–40)

## 2020-07-05 LAB — HEMOGLOBIN A1C
Hgb A1c MFr Bld: 5.1 % (ref 4.8–5.6)
Mean Plasma Glucose: 100 mg/dL

## 2020-07-05 LAB — SARS CORONAVIRUS 2 (TAT 6-24 HRS): SARS Coronavirus 2: NEGATIVE

## 2020-07-05 MED ORDER — ATORVASTATIN CALCIUM 40 MG PO TABS: 40.0000 mg | ORAL_TABLET | Freq: Every day | ORAL | 0 refills | Status: DC

## 2020-07-05 NOTE — Care Management Obs Status (Signed)
[place] NOTIFICATION   Patient Details  Name: Sarah Barnes MRN: [id number] Date of Birth: 11/07/1950   Medicare Observation Status Notification Given:  Yes    Shelbie Hutching, RN 07/05/2020, 9:55 AM

## 2020-07-05 NOTE — Telephone Encounter (Signed)
-----   Message from Nelva Bush, MD sent at 07/05/2020 12:42 PM EST ----- Regarding: EP consult for loop recorder This patient was admitted for stroke and the hospitalist has requested loop recorder placement.  Could you arrange for the patient to be see by Dr. Caryl Comes or Dr. Quentin Ore as soon as possible to discuss ILR placement?  Thanks.  Gerald Stabs

## 2020-07-05 NOTE — Telephone Encounter (Signed)
Referral placed for EP. Will route to scheduling.

## 2020-07-05 NOTE — TOC Initial Note (Signed)
Transition of Care [facility]) - Initial/Assessment Note    Patient Details  Name: Sarah Barnes MRN: [id number] Date of Birth: November 14, 1950  [facility] CM/SW Contact:    Shelbie Hutching, RN Phone Number: 07/05/2020, 1:30 PM  Clinical Narrative:                 Patient placed under observation for stroke.  RNCM met with patient at the bedside.  Patient reports that she is from home where she lives with her brother in law.  Independent in ADL's and requires no equipment.  Patient is current with her PCP.  She would like to get home health services at discharge.  Corene Cornea with [facility] given home health referral for RN, PT, and OT.   Patient may be ready for DC this afternoon.   Expected Discharge Plan: [place] Barriers to Discharge: Continued Medical Work up   Patient Goals and CMS Choice   CMS Medicare.gov Compare Post Acute Care list provided to:: Patient Choice offered to / list presented to : Patient  Expected Discharge Plan and Services Expected Discharge Plan: [place]   Discharge Planning Services: CM Consult Post Acute Care Choice: [place] arrangements for the past 2 months: Single Family Home                 DME Arranged: N/A         HH Arranged: RN,PT,OT [place] Agency: [place] ([place]) Date [place]: 07/05/20 Time [place]: 1329 Representative spoke with at [place]: Corene Cornea  Prior Living Arrangements/Services Living arrangements for the past 2 months: [place] with:: Relatives (Brother in Sports coach) Patient language and need for interpreter reviewed:: Yes Do you feel safe going back to the place where you live?: Yes      Need for Family Participation in Patient Care: Yes (Comment) (stroke) Care giver support system in place?: Yes (comment) (brother in Sports coach)   Criminal Activity/Legal Involvement Pertinent to Current Situation/Hospitalization: No - Comment as  needed  Activities of Daily Living Home Assistive Devices/Equipment: None ADL Screening (condition at time of admission) Patient's cognitive ability adequate to safely complete daily activities?: Yes Is the patient deaf or have difficulty hearing?: No Does the patient have difficulty seeing, even when wearing glasses/contacts?: No Does the patient have difficulty concentrating, remembering, or making decisions?: No Patient able to express need for assistance with ADLs?: Yes Does the patient have difficulty dressing or bathing?: No Independently performs ADLs?: Yes (appropriate for developmental age) Does the patient have difficulty walking or climbing stairs?: No Weakness of Legs: None Weakness of Arms/Hands: None  Permission Sought/Granted Permission sought to share information with : Case Manager,Other (comment) Permission granted to share information with : Yes, Verbal Permission Granted     Permission granted to share info w AGENCY: Home Health agency        Emotional Assessment Appearance:: Appears stated age Attitude/Demeanor/Rapport: Engaged Affect (typically observed): Accepting Orientation: : Oriented to Self,Oriented to Place,Oriented to  Time,Oriented to Situation Alcohol / Substance Use: Not Applicable Psych Involvement: No (comment)  Admission diagnosis:  Stroke [facility]) [I63.9] Acute nonintractable headache, unspecified headache type [R51.9] Cerebrovascular accident (CVA), unspecified mechanism ([place]) [I63.9] Monocular vision loss [H54.60] Patient Active Problem List   Diagnosis Date Noted  . Stroke ([place]) 07/04/2020  . COPD (chronic obstructive pulmonary disease) ([place])   . ETOH abuse   . HLD (hyperlipidemia)   . CAD (coronary  artery disease)   . Tobacco abuse   . Chronic diastolic CHF (congestive heart failure) ([place])   . CKD (chronic kidney disease), stage IIIb   . Encounter for general adult medical examination with abnormal findings 02/09/2020  . Abnormal  renal function finding 02/09/2020  . Vitamin D deficiency 02/09/2020  . Flu vaccine need 02/09/2020  . Dysuria 02/09/2020  . Bilateral leg pain 11/28/2019  . Generalized abdominal pain 11/28/2019  . Inflammatory polyarthropathy ([place]) 11/28/2019  . Generalized weakness 01/26/2019  . Alcohol use disorder   . Hypertrophic obstructive cardiomyopathy ([place])   . Aortic valve stenosis   . Abnormal EKG 12/26/2016  . Troponin level elevated 12/26/2016  . Syncope 12/25/2016  . Heart palpitations - PACs 12/24/2016  . Shortness of breath at rest 12/24/2016  . Ventral hernia 08/13/2016  . Panic disorder with agoraphobia 07/13/2015  . Major depressive disorder, recurrent severe without psychotic features ([place]) 07/12/2015  . Tobacco use disorder 07/12/2015  . Substance abuse ([place]) 07/12/2015  . Knee pain, bilateral 09/17/2014  . Generalized anxiety disorder 05/17/2014  . Hypertriglyceridemia 05/05/2014  . GERD (gastroesophageal reflux disease) 05/05/2014  . Essential hypertension 05/05/2014   PCP:  [name], MD Pharmacy:   [facility] [address] [place] [address] Phone: [phone] Fax: [phone]  [id number] [place] [address]Phone: [phone]: [address] [place]Phone: [phone]: [phone]    Social Determinants of Health (SDOH) Interventions    Readmission Risk Interventions No flowsheet data found.

## 2020-07-05 NOTE — Discharge Summary (Signed)
Physician Discharge Summary  Sarah Barnes ZJI:[id number] DOB: 1950/12/06 DOA: 07/04/2020  PCP: [name], MD  Admit date: 07/04/2020 Discharge date: 07/05/2020  Admitted From: Home Disposition:  Home  Recommendations for Outpatient Follow-up:  1. Follow up with PCP in 1-2 weeks 2. Follow up with Neurology as scheduled  Home Health:PT, OT, RN   Discharge Condition:Stable CODE STATUS:Full Diet recommendation: Heart healthy   Brief/Interim Summary: 70 y.o. female with medical history significant of hypertension, hyperlipidemia, COPD, GERD, depression, sciatica, aortic stenosis, hypertrophic obstructive cardiomyopathy, hepatitis, CAD, alcohol abuse, tobacco abuse, dCHF, CKD 3, who presents with right facial numbness and blurry vision in the right eye.  Discharge Diagnoses:  Principal Problem:   Stroke [facility]) Active Problems:   GERD (gastroesophageal reflux disease)   Essential hypertension   Major depressive disorder, recurrent severe without psychotic features (HCC)   Hypertrophic obstructive cardiomyopathy (HCC)   COPD (chronic obstructive pulmonary disease) (HCC)   ETOH abuse   HLD (hyperlipidemia)   CAD (coronary artery disease)   Tobacco abuse   Chronic diastolic CHF (congestive heart failure) (HCC)   CKD (chronic kidney disease), stage IIIb   Possible stroke ([place]): CT of head showed new rounded low density is noted in left caudate nucleus concerning for infarction of indeterminate age. CTA negative for LOV. Neurology consulted -TEE was already performed recently with no PFO noted -Pt was started on atorvastatin 40mg  for elevated LDL of 90 -A1c noted to be 5.0 -Pt recommended to continue ASA 81mg  daiyl -Seen by PT/OT. Pt has since been set up for [place] -Pt to f/u closely with Neurology as outpatient -Pt also set up for Loop recorder as outpatient. Had discussed with Cardiology who will arrange this  GERD (gastroesophageal reflux disease) -Protonix  given  Essential hypertension -IV hydralazine as needed for SBP > 200 -Coreg, irbesartan -Initially held Lasix and HCTZ in the setting of possible stroke to avoid dehydration -Cont home meds with exception of HCTZ on d/c   Major depressive disorder, recurrent severe without psychotic features ([place]) -Continue home medication  Tobacco abuse and Alcohol abuse: -Nicotine patch -CIWA protocol while in hospital  Hypertrophic obstructive cardiomyopathy and chronic diastolic CHF: 2D echo on 1/75/1025 showed normal EF.  Patient does not have leg edema or JVD.  CHF seem to be compensated.  COPD (chronic obstructive pulmonary disease) (HCC): Stable -Bronchodilators  HLD (hyperlipidemia) -Fenofibrate and Lipitor  CAD (coronary artery disease) -Aspirin, fenofibrate, Lipitor  CKD (chronic kidney disease), stage IIIb: Renal function stable close to baseline    Discharge Instructions  Discharge Instructions    Ambulatory referral to Neurology   Complete by: As directed    An appointment is requested in approximately: 2-3 months for stroke f/u     Allergies as of 07/05/2020   No Known Allergies     Medication List    STOP taking these medications   ergocalciferol 1.25 MG (50000 UT) capsule Commonly known as: Drisdol   hydrochlorothiazide 25 MG tablet Commonly known as: HYDRODIURIL     TAKE these medications   aspirin 81 MG EC tablet Take 1 tablet (81 mg total) by mouth daily.   atorvastatin 40 MG tablet Commonly known as: LIPITOR Take 1 tablet (40 mg total) by mouth daily. Start taking on: July 06, 2020   busPIRone 15 MG tablet Commonly known as: BUSPAR Take 1 tablet (15 mg total) by mouth 2 (two) times daily.   carvedilol 25 MG tablet Commonly known as: COREG Take 25 mg by mouth 2 (  two) times daily.   celecoxib 200 MG capsule Commonly known as: CELEBREX Take 1 capsule (200 mg total) by mouth daily.   escitalopram 10 MG tablet Commonly known as:  LEXAPRO Take 1 tablet (10 mg total) by mouth daily.   fenofibrate micronized 134 MG capsule Commonly known as: LOFIBRA Take 1 capsule (134 mg total) by mouth daily before breakfast. NEEDS APPOINTMENT FOR FUTURE REFILLS   folic acid 1 MG tablet Commonly known as: FOLVITE Take 1 tablet (1 mg total) by mouth daily.   furosemide 40 MG tablet Commonly known as: LASIX Take 1 tablet (40 mg total) by mouth daily.   gabapentin 100 MG capsule Commonly known as: NEURONTIN Start taking 1 capsule po QPM. May titrate dosing up to TID as needed and as tolerated .   multivitamin with minerals Tabs tablet Take 1 tablet by mouth daily.   nicotine 21 mg/24hr patch Commonly known as: NICODERM CQ - dosed in mg/24 hours Place 1 patch (21 mg total) onto the skin daily.   pantoprazole 40 MG tablet Commonly known as: PROTONIX Take 1 tablet (40 mg total) by mouth daily. NEEDS APPOINTMENT FOR FUTURE REFILLS   tiZANidine 2 MG tablet Commonly known as: ZANAFLEX Take 1 tablet (2 mg total) by mouth every 8 (eight) hours as needed for muscle spasms.   traZODone 100 MG tablet Commonly known as: DESYREL TAKE 1 TABLET BY MOUTH EVERYDAY AT BEDTIME   valsartan 160 MG tablet Commonly known as: DIOVAN Take 160 mg by mouth daily.       Follow-up Information    Vickie Epley, MD. Go on 07/20/2020.   Specialties: Cardiology, Radiology Why: @ 2:40pm Contact information: [address] [place] [address] [phone]        [name], MD. Schedule an appointment as soon as possible for a visit in 2 week(s).   Specialty: Internal Medicine Contact information: [place] [address] [phone]        Follow up with Neurology as will be scheduled Follow up.              No Known Allergies  Consultations:  Neurology  Procedures/Studies: CT Angio Head W or Wo Contrast  Result Date: 07/04/2020 CLINICAL DATA:  Neuro deficit, acute, stroke  suspected. EXAM: CT ANGIOGRAPHY HEAD AND NECK TECHNIQUE: Multidetector CT imaging of the head and neck was performed using the standard protocol during bolus administration of intravenous contrast. Multiplanar CT image reconstructions and MIPs were obtained to evaluate the vascular anatomy. Carotid stenosis measurements (when applicable) are obtained utilizing NASCET criteria, using the distal internal carotid diameter as the denominator. CONTRAST:  37mL OMNIPAQUE IOHEXOL 350 MG/ML SOLN COMPARISON:  Head CT July 04, 2020. FINDINGS: CTA NECK FINDINGS Aortic arch: Standard branching. Imaged portion shows no evidence of aneurysm or dissection. Calcified plaques at the origin of the innominate and left subclavian artery without hemodynamically significant stenosis. Right carotid system: Mild atherosclerotic changes at the right carotid bifurcation. No evidence of dissection, stenosis (50% or greater) or occlusion. Left carotid system: No evidence of dissection, stenosis (50% or greater) or occlusion. Vertebral arteries: Right dominant. No evidence of dissection, stenosis (50% or greater) or occlusion. Skeleton: Negative. Other neck: Negative. Upper chest: Negative. Review of the MIP images confirms the above findings CTA HEAD FINDINGS Anterior circulation: Calcified plaques in the bilateral carotid siphons. Mild luminal irregularity of the right M1/MCA, may represent mild intracranial atherosclerotic disease without hemodynamically significant stenosis. No significant stenosis, proximal  occlusion, aneurysm, or vascular malformation. Posterior circulation: No significant stenosis, proximal occlusion, aneurysm, or vascular malformation. Venous sinuses: As permitted by contrast timing, patent. Anatomic variants: Severely hypoplastic/aplastic left A1/ACA segment with a dominant right A1/ACA supplying both A2 segment. Prominent right posterior communicating artery. Review of the MIP images confirms the above findings  IMPRESSION: 1. No large vessel occlusion, hemodynamically significant stenosis, or evidence of dissection. 2. Mild luminal irregularity of the right M1/MCA, may represent mild intracranial atherosclerotic disease without hemodynamically significant stenosis. Electronically Signed   By: Pedro Earls M.D.   On: 07/04/2020 13:18   CT HEAD WO CONTRAST  Result Date: 07/04/2020 CLINICAL DATA:  Right facial numbness. EXAM: CT HEAD WITHOUT CONTRAST TECHNIQUE: Contiguous axial images were obtained from the base of the skull through the vertex without intravenous contrast. COMPARISON:  January 26, 2019. FINDINGS: Brain: Mild chronic ischemic white matter disease is noted. New rounded low density is noted in left caudate nucleus concerning for infarction of indeterminate age. No mass effect or midline shift is noted. Ventricular size is within normal limits. There is no evidence of hemorrhage or mass lesion. Vascular: No hyperdense vessel or unexpected calcification. Skull: Normal. Negative for fracture or focal lesion. Sinuses/Orbits: No acute finding. Other: None. IMPRESSION: New rounded low density is noted in left caudate nucleus concerning for infarction of indeterminate age. MRI is recommended for further evaluation. Electronically Signed   By: Marijo Conception M.D.   On: 07/04/2020 11:42   CT Angio Neck W and/or Wo Contrast  Result Date: 07/04/2020 CLINICAL DATA:  Neuro deficit, acute, stroke suspected. EXAM: CT ANGIOGRAPHY HEAD AND NECK TECHNIQUE: Multidetector CT imaging of the head and neck was performed using the standard protocol during bolus administration of intravenous contrast. Multiplanar CT image reconstructions and MIPs were obtained to evaluate the vascular anatomy. Carotid stenosis measurements (when applicable) are obtained utilizing NASCET criteria, using the distal internal carotid diameter as the denominator. CONTRAST:  61mL OMNIPAQUE IOHEXOL 350 MG/ML SOLN COMPARISON:  Head CT  July 04, 2020. FINDINGS: CTA NECK FINDINGS Aortic arch: Standard branching. Imaged portion shows no evidence of aneurysm or dissection. Calcified plaques at the origin of the innominate and left subclavian artery without hemodynamically significant stenosis. Right carotid system: Mild atherosclerotic changes at the right carotid bifurcation. No evidence of dissection, stenosis (50% or greater) or occlusion. Left carotid system: No evidence of dissection, stenosis (50% or greater) or occlusion. Vertebral arteries: Right dominant. No evidence of dissection, stenosis (50% or greater) or occlusion. Skeleton: Negative. Other neck: Negative. Upper chest: Negative. Review of the MIP images confirms the above findings CTA HEAD FINDINGS Anterior circulation: Calcified plaques in the bilateral carotid siphons. Mild luminal irregularity of the right M1/MCA, may represent mild intracranial atherosclerotic disease without hemodynamically significant stenosis. No significant stenosis, proximal occlusion, aneurysm, or vascular malformation. Posterior circulation: No significant stenosis, proximal occlusion, aneurysm, or vascular malformation. Venous sinuses: As permitted by contrast timing, patent. Anatomic variants: Severely hypoplastic/aplastic left A1/ACA segment with a dominant right A1/ACA supplying both A2 segment. Prominent right posterior communicating artery. Review of the MIP images confirms the above findings IMPRESSION: 1. No large vessel occlusion, hemodynamically significant stenosis, or evidence of dissection. 2. Mild luminal irregularity of the right M1/MCA, may represent mild intracranial atherosclerotic disease without hemodynamically significant stenosis. Electronically Signed   By: Pedro Earls M.D.   On: 07/04/2020 13:18   MR BRAIN WO CONTRAST  Result Date: 07/04/2020 CLINICAL DATA:  Right-sided vision changes EXAM: MRI  HEAD WITHOUT CONTRAST TECHNIQUE: Multiplanar, multiecho pulse  sequences of the brain and surrounding structures were obtained without intravenous contrast. COMPARISON:  Prior CT imaging FINDINGS: Brain: Small foci of diffusion hyperintensity involving the left caudate head in the inferior left lentiform nucleus. Possible additional punctate focus within the left corona radiata (series 5, image 77). There is no intracranial hemorrhage. There is no intracranial mass or mass effect patchy and confluent areas of T2 hyperintensity in the supratentorial white matter are nonspecific but probably reflect mild to moderate chronic microvascular ischemic changes. Chronic small vessel infarct of the lateral left caudate head (finding identified on recent CT). There is no hydrocephalus or extra-axial fluid collection. Prominence of the ventricles and sulci reflects generalized parenchymal volume loss. Vascular: Major vessel flow voids at the skull base are preserved. Skull and upper cervical spine: Normal marrow signal is preserved. Sinuses/Orbits: Paranasal sinuses are aerated. Orbits are unremarkable. Other: Sella is unremarkable.  Mastoid air cells are clear. IMPRESSION: Punctate acute to subacute infarcts of the left basal ganglia and central white matter. Chronic infarct of the left caudate. Mild to moderate chronic microvascular ischemic changes. Electronically Signed   By: Macy Mis M.D.   On: 07/04/2020 15:30    Subjective: Eager to go home today  Discharge Exam: Vitals:   07/05/20 0827 07/05/20 1318  BP: 128/68 108/65  Pulse: (!) 58 60  Resp: 14 18  Temp: 98.7 F (37.1 C) 98.2 F (36.8 C)  SpO2: 97% 95%   Vitals:   07/05/20 0030 07/05/20 0540 07/05/20 0827 07/05/20 1318  BP:  (!) 95/46 128/68 108/65  Pulse: (!) 54 (!) 56 (!) 58 60  Resp:  15 14 18   Temp:  97.9 F (36.6 C) 98.7 F (37.1 C) 98.2 F (36.8 C)  TempSrc:   Oral Oral  SpO2: 93% 99% 97% 95%  Weight:        General: Pt is alert, awake, not in acute distress Cardiovascular: RRR, S1/S2 +,  no rubs, no gallops Respiratory: CTA bilaterally, no wheezing, no rhonchi Abdominal: Soft, NT, ND, bowel sounds + Extremities: no edema, no cyanosis   The results of significant diagnostics from this hospitalization (including imaging, microbiology, ancillary and laboratory) are listed below for reference.     Microbiology: Recent Results (from the past 240 hour(s))  SARS CORONAVIRUS 2 (TAT 6-24 HRS) Nasopharyngeal Nasopharyngeal Swab     Status: None   Collection Time: 07/04/20  1:34 PM   Specimen: Nasopharyngeal Swab  Result Value Ref Range Status   SARS Coronavirus 2 NEGATIVE NEGATIVE Final    Comment: (NOTE) SARS-CoV-2 target nucleic acids are NOT DETECTED.  The SARS-CoV-2 RNA is generally detectable in upper and lower respiratory specimens during the acute phase of infection. Negative results do not preclude SARS-CoV-2 infection, do not rule out co-infections with other pathogens, and should not be used as the sole basis for treatment or other patient management decisions. Negative results must be combined with clinical observations, patient history, and epidemiological information. The expected result is Negative.  Fact Sheet for Patients: SugarRoll.be  Fact Sheet for Healthcare Providers: https://www.woods-mathews.com/  This test is not yet approved or cleared by the [place] FDA and  has been authorized for detection and/or diagnosis of SARS-CoV-2 by FDA under an Emergency Use Authorization (EUA). This EUA will remain  in effect (meaning this test can be used) for the duration of the COVID-19 declaration under Se ction 564(b)(1) of the Act, 21 U.S.C. section 360bbb-3(b)(1), unless the authorization is terminated  or revoked sooner.  Performed at [facility], [place] [address] [place], [place] [address]      Labs: BNP (last 3 results) Recent Labs    07/04/20 1119  BNP 546.2*   Basic Metabolic Panel: Recent  Labs  Lab 07/04/20 1119  NA 139  K 4.1  CL 99  CO2 27  GLUCOSE 97  BUN 27*  CREATININE 1.48*  CALCIUM 9.4   Liver Function Tests: Recent Labs  Lab 07/04/20 1119  AST 19  ALT 14  ALKPHOS 40  BILITOT 0.7  PROT 7.3  ALBUMIN 4.5   No results for input(s): LIPASE, AMYLASE in the last 168 hours. No results for input(s): AMMONIA in the last 168 hours. CBC: Recent Labs  Lab 07/04/20 1119  WBC 8.9  NEUTROABS 4.0  HGB 14.3  HCT 41.8  MCV 91.9  PLT 282   Cardiac Enzymes: No results for input(s): CKTOTAL, CKMB, CKMBINDEX, TROPONINI in the last 168 hours. BNP: Invalid input(s): POCBNP CBG: Recent Labs  Lab 07/04/20 2104  GLUCAP 82   D-Dimer No results for input(s): DDIMER in the last 72 hours. Hgb A1c No results for input(s): HGBA1C in the last 72 hours. Lipid Profile Recent Labs    07/05/20 0545  CHOL 174  HDL 37*  LDLCALC 90  TRIG 237*  CHOLHDL 4.7   Thyroid function studies No results for input(s): TSH, T4TOTAL, T3FREE, THYROIDAB in the last 72 hours.  Invalid input(s): FREET3 Anemia work up No results for input(s): VITAMINB12, FOLATE, FERRITIN, TIBC, IRON, RETICCTPCT in the last 72 hours. Urinalysis    Component Value Date/Time   COLORURINE YELLOW (A) 01/26/2019 0812   APPEARANCEUR Clear 01/22/2020 1142   LABSPEC 1.008 01/26/2019 0812   PHURINE 6.0 01/26/2019 0812   GLUCOSEU Negative 01/22/2020 1142   HGBUR MODERATE (A) 01/26/2019 0812   BILIRUBINUR Negative 01/22/2020 1142   KETONESUR NEGATIVE 01/26/2019 0812   PROTEINUR Negative 01/22/2020 1142   PROTEINUR NEGATIVE 01/26/2019 0812   UROBILINOGEN 0.2 11/25/2014 0415   NITRITE Negative 01/22/2020 1142   NITRITE NEGATIVE 01/26/2019 0812   LEUKOCYTESUR 1+ (A) 01/22/2020 1142   LEUKOCYTESUR SMALL (A) 01/26/2019 0812   Sepsis Labs Invalid input(s): PROCALCITONIN,  WBC,  LACTICIDVEN Microbiology Recent Results (from the past 240 hour(s))  SARS CORONAVIRUS 2 (TAT 6-24 HRS) Nasopharyngeal  Nasopharyngeal Swab     Status: None   Collection Time: 07/04/20  1:34 PM   Specimen: Nasopharyngeal Swab  Result Value Ref Range Status   SARS Coronavirus 2 NEGATIVE NEGATIVE Final    Comment: (NOTE) SARS-CoV-2 target nucleic acids are NOT DETECTED.  The SARS-CoV-2 RNA is generally detectable in upper and lower respiratory specimens during the acute phase of infection. Negative results do not preclude SARS-CoV-2 infection, do not rule out co-infections with other pathogens, and should not be used as the sole basis for treatment or other patient management decisions. Negative results must be combined with clinical observations, patient history, and epidemiological information. The expected result is Negative.  Fact Sheet for Patients: SugarRoll.be  Fact Sheet for Healthcare Providers: https://www.woods-mathews.com/  This test is not yet approved or cleared by the [place] FDA and  has been authorized for detection and/or diagnosis of SARS-CoV-2 by FDA under an Emergency Use Authorization (EUA). This EUA will remain  in effect (meaning this test can be used) for the duration of the COVID-19 declaration under Se ction 564(b)(1) of the Act, 21 U.S.C. section 360bbb-3(b)(1), unless the authorization is terminated or revoked sooner.  Performed at [facility], [place] [address], [place], [place] [address]    Time spent: 30 min  SIGNED:   Marylu Lund, MD  [facility] 07/05/2020, 4:06 PM  If 7PM-7AM, please contact night-coverage

## 2020-07-05 NOTE — TOC Transition Note (Signed)
Transition of Care [facility]) - CM/SW Discharge Note   Patient Details  Name: Sarah Barnes MRN: [id number] Date of Birth: Mar 30, 1951  [facility] CM/SW Contact:  Shelbie Hutching, RN Phone Number: 07/05/2020, 4:09 PM   Clinical Narrative:    Patient is medically cleared for discharge home with home health services through [facility].  Corene Cornea with [facility] notified of discharge today.   RNCM completed consult for substance abuse counseling- patient reports that she rarely drinks alcohol maybe once every 2 months.  Drink of choice is Vodka and she says she has a few.  Patient reports she used to drink heavily but does not feel she has a problem now and does not want resources for outpatient treatment.     Final next level of care: Home w Home Health Services Barriers to Discharge: No Barriers Identified   Patient Goals and CMS Choice   CMS Medicare.gov Compare Post Acute Care list provided to:: Patient Choice offered to / list presented to : Patient  Discharge Placement                       Discharge Plan and Services   Discharge Planning Services: CM Consult Post Acute Care Choice: Home Health          DME Arranged: N/A         HH Arranged: RN,PT,OT [place] Agency: [place] ([facility]) Date HH Agency Contacted: 07/05/20 Time [place]: 1275 Representative spoke with at [place]: [place] ([place]) Interventions     Readmission Risk Interventions No flowsheet data found.

## 2020-07-05 NOTE — Telephone Encounter (Signed)
Scheduled 3/2 at 240 [address]bert noted Loop Recorder Eval.

## 2020-07-05 NOTE — Evaluation (Signed)
Physical Therapy Evaluation Patient Details Name: Sarah Barnes MRN: [id number] DOB: 12-20-1950 Today's Date: 07/05/2020   History of Present Illness  70 y.o. female with medical history significant of hypertension, hyperlipidemia, COPD, GERD, depression, sciatica, aortic stenosis, hypertrophic obstructive cardiomyopathy, hepatitis, CAD, alcohol abuse, tobacco abuse, dCHF, CKD 3, who presents with right facial numbness and blurry vision in the right eye.  Clinical Impression  Pt is a pleasant 70 year old female who was admitted for CVA. Pt performs transfers with mod I and ambulation with cga and no AD. Pt demonstrates deficits with balance. Discussed use of AD for improved balance and decreased risk of falls. Still complaining of R eye vision loss and decreased facial sensation. B UE/LE strength/sensation/coordination in tact. Is very close to baseline. Would benefit from skilled PT to address above deficits and promote optimal return to PLOF. Currently recommending OP PT for balance training.   Follow Up Recommendations Outpatient PT    Equipment Recommendations  Rolling walker with 5" wheels    Recommendations for Other Services       Precautions / Restrictions Precautions Precautions: Fall Restrictions Weight Bearing Restrictions: No      Mobility  Bed Mobility               General bed mobility comments: not performed as received on EOB    Transfers Overall transfer level: Modified independent Equipment used: 1 person hand held assist Transfers: Sit to/from Stand Sit to Stand: Modified independent (Device/Increase time)         General transfer comment: upright posture. Reports no dizziness  Ambulation/Gait Ambulation/Gait assistance: Min guard Gait Distance (Feet): 40 Feet Assistive device: 1 person hand held assist Gait Pattern/deviations: Step-through pattern     General Gait Details: slight unsteadiness noted, however no formal LOB noted. Recommend  continued ambulation with AD. Discussed use of RW  Stairs            Wheelchair Mobility    Modified Rankin (Stroke Patients Only)       Balance Overall balance assessment: Needs assistance Sitting-balance support: Feet supported Sitting balance-Leahy Scale: Good     Standing balance support: No upper extremity supported;During functional activity Standing balance-Leahy Scale: Fair                               Pertinent Vitals/Pain Pain Assessment: No/denies pain    Home Living Family/patient expects to be discharged to:: Private residence Living Arrangements: Other relatives (brother in Sports [employer]) Available Help at Discharge: Available 24 hours/day Type of Home: House Home Access: Stairs to enter Entrance Stairs-Rails: Can reach both Entrance Stairs-Number of Steps: 3 Home Layout: One level Home Equipment: Cane - single point      Prior Function Level of Independence: Independent         Comments: reports fully independent and no recent falls     Hand Dominance   Dominant Hand: Right    Extremity/Trunk Assessment   Upper Extremity Assessment Upper Extremity Assessment: Overall WFL for tasks assessed    Lower Extremity Assessment Lower Extremity Assessment: Overall WFL for tasks assessed       Communication   Communication: No difficulties  Cognition Arousal/Alertness: Awake/alert Behavior During Therapy: Flat affect Overall Cognitive Status: Within Functional Limits for tasks assessed  General Comments      Exercises     Assessment/Plan    PT Assessment Patient needs continued PT services  PT Problem List Decreased balance;Decreased mobility;Impaired sensation       PT Treatment Interventions Gait training;DME instruction;Balance training    PT Goals (Current goals can be found in the Care Plan section)  Acute Rehab PT Goals Patient Stated Goal: to go home and take  care of dog PT Goal Formulation: With patient Time For Goal Achievement: 07/19/20 Potential to Achieve Goals: Good    Frequency 7X/week   Barriers to discharge        Co-evaluation               AM-PAC PT "6 Clicks" Mobility  Outcome Measure Help needed turning from your back to your side while in a flat bed without using bedrails?: None Help needed moving from lying on your back to sitting on the side of a flat bed without using bedrails?: None Help needed moving to and from a bed to a chair (including a wheelchair)?: A Little Help needed standing up from a chair using your arms (e.g., wheelchair or bedside chair)?: A Little Help needed to walk in hospital room?: A Little Help needed climbing 3-5 steps with a railing? : A Little 6 Click Score: 20    End of Session   Activity Tolerance: Patient tolerated treatment well Patient left: in chair;with chair alarm set Nurse Communication: Mobility status PT Visit Diagnosis: Unsteadiness on feet (R26.81)    Time: 6812-7517 PT Time Calculation (min) (ACUTE ONLY): 19 min   Charges:   PT Evaluation $PT Eval Low Complexity: 1 Low          Greggory Stallion, PT, DPT [phone]  [place] 07/05/2020, 5:19 PM

## 2020-07-05 NOTE — Progress Notes (Addendum)
NEUROLOGY CONSULTATION PROGRESS NOTE   Date of service: July 05, 2020 Patient Name: Sarah Barnes MRN:  [id number] DOB:  1951/03/05  Brief HPI   Smoker 1-1.5 ppds for several decades, EtOh use, COPD, GAD, HTN, HLD, mild aortic stenosis who presents with acute onset mono-occular R eye vision loss in superior temporal and superior nasal quadrant and R facial numbness. Found to have Right Branch retinal artery occlusion by ophthalmology along with MRI Brain demonstrating punctate acute to subacute infarcts of the left basal ganglia and central white matter.   Interval Hx   Has a left facial droop today that is new.  Vitals   Vitals:   07/04/20 1933 07/05/20 0028 07/05/20 0030 07/05/20 0540  BP: 134/71 (!) 93/47  (!) 95/46  Pulse: (!) 50 (!) 51 (!) 54 (!) 56  Resp: 15 15  15   Temp: 97.8 F (36.6 C) 97.9 F (36.6 C)  97.9 F (36.6 C)  TempSrc: Oral     SpO2: 99% (!) 87% 93% 99%  Weight:         Body mass index is 30.15 kg/m.  Physical Exam   General: Laying comfortably in bed; in no acute distress. HENT: Normal oropharynx and mucosa. Normal external appearance of ears and nose.  Neck: Supple, no pain or tenderness  CV: No JVD. No peripheral edema.  Pulmonary: Symmetric Chest rise. Normal respiratory effort.  Abdomen: Soft to touch, non-tender.  Ext: No cyanosis, edema, or deformity  Skin: No rash. Normal palpation of skin.   Musculoskeletal: Normal digits and nails by inspection. No clubbing.   Neurologic Examination  Mental status/Cognition: Alert, oriented to self, place, month and year, good attention.  Speech/language: Fluent, comprehension intact, object naming intact, repetition intact.  Cranial nerves:   CN II Pupils equal and reactive to light, no VF deficits    CN III,IV,VI EOM intact, no gaze preference or deviation, no nystagmus    CN V normal sensation in V1, V2, and V3 segments bilaterally    CN VII Mild L nsolabial fold flattening that is new.    CN  VIII normal hearing to speech    CN IX & X normal palatal elevation, no uvular deviation    CN XI 5/5 head turn and 5/5 shoulder shrug bilaterally    CN XII midline tongue protrusion    Motor:  Muscle bulk: normal, tone normal, pronator drift none tremor none Mvmt Root Nerve  Muscle Right Left Comments  SA C5/6 Ax Deltoid 5 5   EF C5/6 Mc Biceps 5 5   EE C6/7/8 Rad Triceps 5 5   WF C6/7 Med FCR     WE C7/8 PIN ECU     F Ab C8/T1 U ADM/FDI 5 5   HF L1/2/3 Fem Illopsoas 5 5   KE L2/3/4 Fem Quad     DF L4/5 D Peron Tib Ant 5 5   PF S1/2 Tibial Grc/Sol 5 5    Sensation:  Light touch Intact throughout   Pin prick    Temperature    Vibration   Proprioception    Coordination/Complex Motor:  - Finger to Nose intact BL - Heel to shin intact BL - Rapid alternating movement are normal - Gait: Deferred,  Labs   Basic Metabolic Panel:  Lab Results  Component Value Date   NA 139 07/04/2020   K 4.1 07/04/2020   CO2 27 07/04/2020   GLUCOSE 97 07/04/2020   BUN 27 (H) 07/04/2020   CREATININE 1.48 (H)  07/04/2020   CALCIUM 9.4 07/04/2020   GFRNONAA 38 (L) 07/04/2020   GFRAA 35 (L) 11/20/2019   HbA1c:  Lab Results  Component Value Date   HGBA1C 5.0 12/23/2014   LDL:  Lab Results  Component Value Date   LDLCALC 90 07/05/2020   Urine Drug Screen:     Component Value Date/Time   LABOPIA NONE DETECTED 07/04/2020 1335   LABOPIA NONE DETECTED 07/06/2015 1848   COCAINSCRNUR NONE DETECTED 07/04/2020 1335   LABBENZ NONE DETECTED 07/04/2020 1335   LABBENZ POSITIVE (A) 07/06/2015 1848   AMPHETMU NONE DETECTED 07/04/2020 1335   AMPHETMU NONE DETECTED 07/06/2015 1848   THCU NONE DETECTED 07/04/2020 1335   THCU NONE DETECTED 07/06/2015 1848   LABBARB NONE DETECTED 07/04/2020 1335   LABBARB NONE DETECTED 07/06/2015 1848    Alcohol Level     Component Value Date/Time   ETH <10 01/26/2019 0727   No results found for: PHENYTOIN, ZONISAMIDE, LAMOTRIGINE, LEVETIRACETA No results  found for: PHENYTOIN, PHENOBARB, VALPROATE, CBMZ  Imaging and Diagnostic studies  Results for orders placed during the hospital encounter of 07/04/20  MR BRAIN WO CONTRAST  Narrative CLINICAL DATA:  Right-sided vision changes  EXAM: MRI HEAD WITHOUT CONTRAST  TECHNIQUE: Multiplanar, multiecho pulse sequences of the brain and surrounding structures were obtained without intravenous contrast.  COMPARISON:  Prior CT imaging  FINDINGS: Brain: Small foci of diffusion hyperintensity involving the left caudate head in the inferior left lentiform nucleus. Possible additional punctate focus within the left corona radiata (series 5, image 77).  There is no intracranial hemorrhage. There is no intracranial mass or mass effect patchy and confluent areas of T2 hyperintensity in the supratentorial white matter are nonspecific but probably reflect mild to moderate chronic microvascular ischemic changes. Chronic small vessel infarct of the lateral left caudate head (finding identified on recent CT). There is no hydrocephalus or extra-axial fluid collection. Prominence of the ventricles and sulci reflects generalized parenchymal volume loss.  Vascular: Major vessel flow voids at the skull base are preserved.  Skull and upper cervical spine: Normal marrow signal is preserved.  Sinuses/Orbits: Paranasal sinuses are aerated. Orbits are unremarkable.  Other: Sella is unremarkable.  Mastoid air cells are clear.  IMPRESSION: Punctate acute to subacute infarcts of the left basal ganglia and central white matter.  Chronic infarct of the left caudate.  Mild to moderate chronic microvascular ischemic changes.    Impression   Sarah Barnes is a 70 y.o. female  presents with acute onset mono-occular R eye vision loss in superior temporal and superior nasal quadrant and R facial numbness. Found to have Right Branch retinal artery occlusion by ophthalmology along with MRI Brain  demonstrating punctate acute to subacute infarcts of the left basal ganglia and central white matter.  Recommendations  Plan:  - Frequent Neuro checks per stroke unit protocol - MRI Brain w/o C with punctate left BG and cntral white matter infarct, chronic left caudate infarct. - CT Angio head and neck with no LVO - She had a TEE completed in August 2021with normal LVEF, nodular aortic valve thickening. No mention of a PFO. - LDL was elevated to 90, she was started on Atorvastatin 40mg  daily - HbA1c of 5.0 - Antithrombotic - Aspirin 81mg  daily. - Recommend DVT ppx - SBP goal - Gradual normotension - Recommend Telemetry monitoring for arrythmia - Recommend bedside swallow screen prior to PO intake. - Stroke education booklet - Recommend PT/OT/SLP consult - I extensively counseled her on the importance of  quitting smoking to reduce her risk of stroke. She will think about it. Reports that she has quit in the past and is leaning towards doing it again. Discussed the available resources to help her quit. - Discussed case with our stroke team. Given that Afibb is associated with COPD and her age, recommend Loop recorder placement or if patient not agreeable to it, then consider 30 day event monitor. Patient is leaning towards Loop placement. - Follow up with stroke clinic(Dr. Antony Contras with [facility]) outpatient in 2-3 months.  ______________________________________________________________________   Thank you for the opportunity to take part in the care of this patient. If you have any further questions, please contact the neurology consultation attending.  Signed,  [place] Pager Number [phone]

## 2020-07-05 NOTE — Evaluation (Signed)
Occupational Therapy Evaluation Patient Details Name: Sarah Barnes MRN: [id number] DOB: [birth year] Today's Date: 07/05/2020    History of Present Illness 70 y.o. female with medical history significant of hypertension, hyperlipidemia, COPD, GERD, depression, sciatica, aortic stenosis, hypertrophic obstructive cardiomyopathy, hepatitis, CAD, alcohol abuse, tobacco abuse, dCHF, CKD 3, who presents with right facial numbness and blurry vision in the right eye.   Clinical Impression   Pt seen for OT evaluation this date. Upon arrival to room. Pt seated upright in chair, awake, and agreeable to evaluation. Prior to admission, pt was dressing, bathing (spongebathing occasionally), cooking, and taking care of her dog independently. Pt does not work and rarely drives. Pt was living in a 1-story home with brother-in-law who is available 24/7. This session, pt was complaining of blurry vision in b/l eyes and loss of vision in superior visual field of R eye. Functionally, pt was able to read clock 10 feet away, walk to bathroom with RW and no LOB observed, and wash hands while standing sink-side, however was unable to read words within 12 inches of eyes. During formal vision assessment, pt was unable to track stimulus laterally or medially, or converge eyes. Pt currently requires SUPERVISION for functional mobility with RW, toilet transfers, and standing grooming tasks. Pt required cues for safety awareness (i.e., consistently using RW during functional mobility and not cooking with current visual deficits). Pt would benefit from additional skilled OT services to improve safety awareness and maximize recall/carryover of compensatory vision strategies. Upon discharge, recommend outpatient OT services and supervision/assistance PRN.     Follow Up Recommendations  Outpatient OT;Supervision - Intermittent    Equipment Recommendations  None recommended by OT       Precautions / Restrictions  Precautions Precautions: Fall Restrictions Weight Bearing Restrictions: No      Mobility Bed Mobility               General bed mobility comments: pt in chair upon arrival    Transfers Overall transfer level: Needs assistance Equipment used: Rolling walker (2 wheeled) Transfers: Sit to/from Stand Sit to Stand: Supervision              Balance Overall balance assessment: Needs assistance         Standing balance support: No upper extremity supported;During functional activity Standing balance-Leahy Scale: Fair                             ADL either performed or assessed with clinical judgement   ADL Overall ADL's : Needs assistance/impaired     Grooming: Wash/dry hands;Supervision/safety;Standing                   Toilet Transfer: Supervision/safety;Regular Toilet;RW   Writer and Hygiene: Supervision/safety;Sitting/lateral lean Toileting - Clothing Manipulation Details (indicate cue type and reason): Able to don/doff underwear and perform peri-care     Functional mobility during ADLs: Supervision/safety;Rolling walker;Cueing for safety (verbal cues for consistently using RW during functional mobility)       Vision Patient Visual Report: Blurring of vision;Other (comment) (Pt reports superior vision loss in R eye) Vision Assessment?: Yes Eye Alignment: Impaired (comment) Alignment/Gaze Preference: Head turned Tracking/Visual Pursuits: Left eye does not track laterally;Left eye does not track medially;Right eye does not track laterally;Right eye does not track medially Convergence: Impaired - to be further tested in functional context Visual Fields: Right visual field deficit (pt reporting loss of superior visual field) Additional Comments:  Pt able to read clock 10 feet away, walk to bathroom, and wash hands, however unable to read words 1 foot away            Pertinent Vitals/Pain Pain Assessment:  No/denies pain     Hand Dominance Right   Extremity/Trunk Assessment Upper Extremity Assessment Upper Extremity Assessment: Overall WFL for tasks assessed   Lower Extremity Assessment Lower Extremity Assessment: Overall WFL for tasks assessed       Communication Communication Communication: No difficulties   Cognition Arousal/Alertness: Awake/alert Behavior During Therapy: Flat affect Overall Cognitive Status: Within Functional Limits for tasks assessed                                 General Comments: Pt teary-eyed during PLOF questions, OT provided comfort. Pt required cues for safety awareness (i.e., consistently using RW during functional mobility and not cooking with current visual deficits)   General Comments               Home Living Family/patient expects to be discharged to:: Private residence Living Arrangements: Other relatives (brother in [employer]) Available Help at Discharge: Available 24 hours/day Type of Home: House Home Access: Stairs to enter Technical brewer of Steps: 3 Entrance Stairs-Rails: Can reach both Home Layout: One level     Bathroom Shower/Tub: Teacher, early years/pre: Standard                Prior Functioning/Environment Level of Independence: Independent        Comments: Pt reports being independent with dressing, bathing (spongebathing occasionally), cooking, and taking care of her dog. Pt does not work and rarely drives        OT Problem List: Impaired balance (sitting and/or standing);Impaired vision/perception;Decreased safety awareness;Decreased knowledge of precautions      OT Treatment/Interventions: Self-care/ADL training;Therapeutic exercise;Energy conservation;DME and/or AE instruction;Therapeutic activities;Visual/perceptual remediation/compensation;Patient/family education;Balance training    OT Goals(Current goals can be found in the care plan section) Acute Rehab OT Goals Patient  Stated Goal: to go home and take care of dog OT Goal Formulation: With patient Time For Goal Achievement: 07/19/20 Potential to Achieve Goals: Good ADL Goals Pt Will Perform Grooming: with modified independence;standing Pt Will Perform Toileting - Clothing Manipulation and hygiene: with modified independence;sitting/lateral leans Pt Will Perform Tub/Shower Transfer: with modified independence;ambulating;shower seat;rolling walker  OT Frequency: Min 1X/week    AM-PAC OT "6 Clicks" Daily Activity     Outcome Measure Help from another person eating meals?: None Help from another person taking care of personal grooming?: A Little Help from another person toileting, which includes using toliet, bedpan, or urinal?: A Little Help from another person bathing (including washing, rinsing, drying)?: A Little Help from another person to put on and taking off regular upper body clothing?: A Little Help from another person to put on and taking off regular lower body clothing?: A Little 6 Click Score: 19   End of Session Equipment Utilized During Treatment: Gait belt;Rolling walker Nurse Communication: Mobility status  Activity Tolerance: Patient tolerated treatment well Patient left: in chair;with call bell/phone within reach;with chair alarm set  OT Visit Diagnosis: Unsteadiness on feet (R26.81);Low vision, both eyes (H54.2)                Time: 5397-6734 OT Time Calculation (min): 17 min Charges:  OT General Charges $OT Visit: 1 Visit OT Evaluation $OT Eval Moderate Complexity: 1 Mod  Dharma Pare  [place], OTR/L [place]

## 2020-07-05 NOTE — Progress Notes (Signed)
Patient verbalized understanding of all d/c instructions including follow up appts.

## 2020-07-05 NOTE — Telephone Encounter (Signed)
-----   Message from Sarah Bush, MD sent at 07/05/2020 12:42 PM EST ----- Regarding: EP consult for loop recorder This patient was admitted for stroke and the hospitalist has requested loop recorder placement.  Could you arrange for the patient to be see by Dr. Caryl Comes or Dr. Quentin Ore as soon as possible to discuss ILR placement?  Thanks.  Gerald Stabs

## 2020-07-06 ENCOUNTER — Emergency Department
Admission: EM | Admit: 2020-07-06 | Discharge: 2020-07-07 | Disposition: A | Discharge status: Home / Self Care | Payer: Medicare Other | Attending: Emergency Medicine | Admitting: Emergency Medicine

## 2020-07-06 ENCOUNTER — Other Ambulatory Visit: Payer: Self-pay

## 2020-07-06 ENCOUNTER — Encounter: Payer: Self-pay | Admitting: *Deleted

## 2020-07-06 ENCOUNTER — Emergency Department: Payer: Medicare Other

## 2020-07-06 ENCOUNTER — Telehealth: Payer: Self-pay

## 2020-07-06 DIAGNOSIS — N1832 Chronic kidney disease, stage 3b: Secondary | ICD-10-CM | POA: Diagnosis not present

## 2020-07-06 DIAGNOSIS — I69398 Other sequelae of cerebral infarction: Secondary | ICD-10-CM | POA: Diagnosis not present

## 2020-07-06 DIAGNOSIS — I350 Nonrheumatic aortic (valve) stenosis: Secondary | ICD-10-CM | POA: Diagnosis not present

## 2020-07-06 DIAGNOSIS — Z7982 Long term (current) use of aspirin: Secondary | ICD-10-CM | POA: Insufficient documentation

## 2020-07-06 DIAGNOSIS — G4489 Other headache syndrome: Secondary | ICD-10-CM | POA: Diagnosis not present

## 2020-07-06 DIAGNOSIS — Z8673 Personal history of transient ischemic attack (TIA), and cerebral infarction without residual deficits: Secondary | ICD-10-CM | POA: Diagnosis not present

## 2020-07-06 DIAGNOSIS — I130 Hypertensive heart and chronic kidney disease with heart failure and stage 1 through stage 4 chronic kidney disease, or unspecified chronic kidney disease: Secondary | ICD-10-CM | POA: Diagnosis not present

## 2020-07-06 DIAGNOSIS — I421 Obstructive hypertrophic cardiomyopathy: Secondary | ICD-10-CM | POA: Diagnosis not present

## 2020-07-06 DIAGNOSIS — I5032 Chronic diastolic (congestive) heart failure: Secondary | ICD-10-CM | POA: Insufficient documentation

## 2020-07-06 DIAGNOSIS — R200 Anesthesia of skin: Secondary | ICD-10-CM | POA: Diagnosis not present

## 2020-07-06 DIAGNOSIS — F17210 Nicotine dependence, cigarettes, uncomplicated: Secondary | ICD-10-CM | POA: Insufficient documentation

## 2020-07-06 DIAGNOSIS — H5461 Unqualified visual loss, right eye, normal vision left eye: Secondary | ICD-10-CM | POA: Diagnosis not present

## 2020-07-06 DIAGNOSIS — R42 Dizziness and giddiness: Secondary | ICD-10-CM | POA: Diagnosis not present

## 2020-07-06 DIAGNOSIS — I672 Cerebral atherosclerosis: Secondary | ICD-10-CM | POA: Diagnosis not present

## 2020-07-06 DIAGNOSIS — Z79899 Other long term (current) drug therapy: Secondary | ICD-10-CM | POA: Diagnosis not present

## 2020-07-06 DIAGNOSIS — D631 Anemia in chronic kidney disease: Secondary | ICD-10-CM | POA: Diagnosis not present

## 2020-07-06 DIAGNOSIS — I2510 Atherosclerotic heart disease of native coronary artery without angina pectoris: Secondary | ICD-10-CM | POA: Diagnosis not present

## 2020-07-06 DIAGNOSIS — J449 Chronic obstructive pulmonary disease, unspecified: Secondary | ICD-10-CM | POA: Diagnosis not present

## 2020-07-06 DIAGNOSIS — R001 Bradycardia, unspecified: Secondary | ICD-10-CM | POA: Diagnosis not present

## 2020-07-06 LAB — BASIC METABOLIC PANEL
Anion gap: 11 (ref 5–15)
BUN: 27 mg/dL — ABNORMAL HIGH (ref 8–23)
CO2: 27 mmol/L (ref 22–32)
Calcium: 9.2 mg/dL (ref 8.9–10.3)
Chloride: 98 mmol/L (ref 98–111)
Creatinine, Ser: 1.23 mg/dL — ABNORMAL HIGH (ref 0.44–1.00)
GFR, Estimated: 47 mL/min — ABNORMAL LOW (ref >60)
Glucose, Bld: 107 mg/dL — ABNORMAL HIGH (ref 70–99)
Potassium: 3.9 mmol/L (ref 3.5–5.1)
Sodium: 136 mmol/L (ref 135–145)

## 2020-07-06 LAB — CBC
HCT: 38.7 % (ref 36.0–46.0)
Hemoglobin: 13.6 g/dL (ref 12.0–15.0)
MCH: 32.3 pg (ref 26.0–34.0)
MCHC: 35.1 g/dL (ref 30.0–36.0)
MCV: 91.9 fL (ref 80.0–100.0)
Platelets: 272 10*3/uL (ref 150–400)
RBC: 4.21 MIL/uL (ref 3.87–5.11)
RDW: 12.6 % (ref 11.5–15.5)
WBC: 11.6 10*3/uL — ABNORMAL HIGH (ref 4.0–10.5)
nRBC: 0 % (ref 0.0–0.2)

## 2020-07-06 LAB — TROPONIN I (HIGH SENSITIVITY): Troponin I (High Sensitivity): 83 ng/L — ABNORMAL HIGH (ref <18)

## 2020-07-06 MED ORDER — SODIUM CHLORIDE 0.9 % IV BOLUS (SEPSIS)
500.0000 mL | Freq: Once | INTRAVENOUS | Status: AC
  Administered 2020-07-07: 500 mL via INTRAVENOUS

## 2020-07-06 MED ORDER — ONDANSETRON HCL 4 MG/2ML IJ SOLN
4.0000 mg | Freq: Once | INTRAMUSCULAR | Status: AC
  Administered 2020-07-07: 4 mg via INTRAVENOUS
  Filled 2020-07-06: qty 2

## 2020-07-06 MED ORDER — MECLIZINE HCL 25 MG PO TABS
25.0000 mg | ORAL_TABLET | Freq: Once | ORAL | Status: AC
  Administered 2020-07-07: 25 mg via ORAL
  Filled 2020-07-06: qty 1

## 2020-07-06 NOTE — Telephone Encounter (Signed)
Noted- staff message sent to precert today.

## 2020-07-06 NOTE — Telephone Encounter (Signed)
Gave verbal [facility] from [facility] [phone] for nursing once a week for 4 week and 2 prn

## 2020-07-06 NOTE — ED Provider Notes (Signed)
[facility] Emergency Department Provider Note  ____________________________________________   Event Date/Time   First MD Initiated Contact with Patient 07/06/20 2257     (approximate)  I have reviewed the triage vital signs and the nursing notes.   HISTORY  Chief Complaint Dizziness    HPI Sarah Barnes is a 70 y.o. female with history of alcohol abuse, hypertension, hyperlipidemia, COPD, recent CVA who presents to the emergency department with complaints of vertigo that started at 9 PM while watching television.  She states "my eyes were going all over the place" and that was "bouncing off the walls" with walking.  She states that she felt like the room was spinning.  No lightheadedness.  No chest pain, chest discomfort, shortness of breath.  No recent nausea, vomiting or diarrhea.  No bloody stools or melena.  Was just admitted to the hospital 2/14 -2/15 for acute CVA.  Was not a TPA candidate.  MRI showed punctate infarcts that were acute to subacute in the left basal ganglia and central white matter.  At that time she had right-sided vision changes.  She feels like her blurry vision has gotten worse today.  No vision loss.  States she always has bad vision in the left eye.  No diplopia.  Did have some numbness in both of her hands earlier that has resolved.  Currently no sensory deficits.  No focal weakness.  No new tinnitus, hearing loss, ear pain.        Past Medical History:  Diagnosis Date  . Agoraphobia with panic disorder   . Anemia   . Arthritis    "all over" (08/13/2016)  . Chronic bronchitis ([place])   . COPD (chronic obstructive pulmonary disease) ([place])   . Depression   . Dyspnea    occ  . Dysrhythmia    palpitations occ due to anxiety  . ETOH abuse   . GAD (generalized anxiety disorder)   . GERD (gastroesophageal reflux disease)   . Headache    "daily til I got glasses; now have headache once in awhile" (08/13/2016)  . Hepatitis 1960    "isolated &  hospitalized for 2 weeks; don't know which kind of hepatitis"   . Hernia, hiatal   . High cholesterol   . Hypertension   . Hypertrophic obstructive cardiomyopathy ([place])   . Mild aortic stenosis   . Pneumonia    "once or twice" (08/13/2016)  . PONV (postoperative nausea and vomiting)   . Sciatica     Patient Active Problem List   Diagnosis Date Noted  . Stroke ([place]) 07/04/2020  . COPD (chronic obstructive pulmonary disease) ([place])   . ETOH abuse   . HLD (hyperlipidemia)   . CAD (coronary artery disease)   . Tobacco abuse   . Chronic diastolic CHF (congestive heart failure) ([place])   . CKD (chronic kidney disease), stage IIIb   . Encounter for general adult medical examination with abnormal findings 02/09/2020  . Abnormal renal function finding 02/09/2020  . Vitamin D deficiency 02/09/2020  . Flu vaccine need 02/09/2020  . Dysuria 02/09/2020  . Bilateral leg pain 11/28/2019  . Generalized abdominal pain 11/28/2019  . Inflammatory polyarthropathy ([place]) 11/28/2019  . Generalized weakness 01/26/2019  . Alcohol use disorder   . Hypertrophic obstructive cardiomyopathy ([place])   . Aortic valve stenosis   . Abnormal EKG 12/26/2016  . Troponin level elevated 12/26/2016  . Syncope 12/25/2016  . Heart palpitations - PACs 12/24/2016  . Shortness of breath at  rest 12/24/2016  . Ventral hernia 08/13/2016  . Panic disorder with agoraphobia 07/13/2015  . Major depressive disorder, recurrent severe without psychotic features ([place]) 07/12/2015  . Tobacco use disorder 07/12/2015  . Substance abuse ([place]) 07/12/2015  . Knee pain, bilateral 09/17/2014  . Generalized anxiety disorder 05/17/2014  . Hypertriglyceridemia 05/05/2014  . GERD (gastroesophageal reflux disease) 05/05/2014  . Essential hypertension 05/05/2014    Past Surgical History:  Procedure Laterality Date  . ANAL FISTULECTOMY  1970s X 3  . BACK SURGERY    . HERNIA REPAIR    . KNEE ARTHROSCOPY Right   .  LAPAROSCOPIC CHOLECYSTECTOMY    . LAPAROSCOPIC INCISIONAL / UMBILICAL / VENTRAL HERNIA REPAIR  08/13/2016   VHR w/mesh  . LUMBAR DISC SURGERY     "herniated disc"  . NASAL FRACTURE SURGERY  1970s X 2  . RIGHT/LEFT HEART CATH AND CORONARY ANGIOGRAPHY N/A 12/27/2016   Procedure: RIGHT/LEFT HEART CATH AND CORONARY ANGIOGRAPHY;  Surgeon: Leonie Man, MD;  Location: [facility] INVASIVE CV LAB: Mild-moderate pulmonary hypertension.  Hyperdynamic ventricle noted.  EF 55-65% -hyperdynamic (unable to measure LVOT gradient).  Mildly elevated very tortuous but angiographically normal coronary arteries, suggesting hypertensive heart disease  . SHOULDER ARTHROSCOPY WITH ROTATOR CUFF REPAIR Right 1990s?  . TONSILLECTOMY  1951  . TRANSTHORACIC ECHOCARDIOGRAM  12/2016    EF 60-65% with dynamic outflow tract obstruction at rest. Peak gradient 52 mmHg consistent with HOCM.  GRII DD area. Aortic sclerosis but no stenosis. Systolic anterior motion of mitral valve chordae. Mild mitral stenosis. Moderate LA dilation and mild RA dilation. Peak PA pressures 35 mmHg.  Marland Kitchen VENTRAL HERNIA REPAIR N/A 08/13/2016   Procedure: LAPAROSCOPIC VENTRAL HERNIA REPAIR WITH MESH;  Surgeon: Judeth Horn, MD;  Location: [facility];  Service: General;  Laterality: N/A;    Prior to Admission medications   Medication Sig Start Date End Date Taking? Authorizing Provider  aspirin EC 81 MG EC tablet Take 1 tablet (81 mg total) by mouth daily. 12/29/16  Yes Barton Dubois, MD  atorvastatin (LIPITOR) 40 MG tablet Take 1 tablet (40 mg total) by mouth daily. 07/06/20 08/05/20 Yes Donne Hazel, MD  busPIRone (BUSPAR) 15 MG tablet Take 1 tablet (15 mg total) by mouth 2 (two) times daily. 06/07/20  Yes [name], MD  carvedilol (COREG) 25 MG tablet Take 25 mg by mouth 2 (two) times daily. 11/19/19  Yes [provider]  celecoxib (CELEBREX) 200 MG capsule Take 1 capsule (200 mg total) by mouth daily. 05/03/20  Yes Boscia, Heather E, NP  escitalopram  (LEXAPRO) 10 MG tablet Take 1 tablet (10 mg total) by mouth daily. 03/24/20  Yes Boscia, Greer Ee, NP  fenofibrate micronized (LOFIBRA) 134 MG capsule Take 1 capsule (134 mg total) by mouth daily before breakfast. NEEDS APPOINTMENT FOR FUTURE REFILLS 06/20/18  Yes Leonie Man, MD  folic acid (FOLVITE) 1 MG tablet Take 1 tablet (1 mg total) by mouth daily. 01/28/19  Yes Demetrios Loll, MD  furosemide (LASIX) 40 MG tablet Take 1 tablet (40 mg total) by mouth daily. 06/16/20  Yes [name], MD  gabapentin (NEURONTIN) 100 MG capsule Start taking 1 capsule po QPM. May titrate dosing up to TID as needed and as tolerated . 05/26/20  Yes Ronnell Freshwater, NP  meclizine (ANTIVERT) 25 MG tablet Take 1 tablet (25 mg total) by mouth 3 (three) times daily as needed for dizziness. 07/07/20  Yes Jeremy Ditullio, Delice Bison, DO  Multiple Vitamin (MULTIVITAMIN WITH  MINERALS) TABS tablet Take 1 tablet by mouth daily. 12/29/16  Yes Barton Dubois, MD  ondansetron (ZOFRAN ODT) 4 MG disintegrating tablet Take 1 tablet (4 mg total) by mouth every 6 (six) hours as needed for nausea or vomiting. 07/07/20  Yes Cheyene Hamric, Cyril Mourning N, DO  pantoprazole (PROTONIX) 40 MG tablet Take 1 tablet (40 mg total) by mouth daily. NEEDS APPOINTMENT FOR FUTURE REFILLS 04/04/20  Yes Boscia, Heather E, NP  tiZANidine (ZANAFLEX) 2 MG tablet Take 1 tablet (2 mg total) by mouth every 8 (eight) hours as needed for muscle spasms. 04/20/20  Yes [name], MD  traZODone (DESYREL) 100 MG tablet TAKE 1 TABLET BY MOUTH EVERYDAY AT BEDTIME 06/30/20  Yes [name], MD  valsartan (DIOVAN) 160 MG tablet Take 160 mg by mouth daily. 04/16/20  Yes [provider]  nicotine (NICODERM CQ - DOSED IN MG/24 HOURS) 21 mg/24hr patch Place 1 patch (21 mg total) onto the skin daily. 12/29/16   Barton Dubois, MD    Allergies Patient has no known allergies.  Family History  Problem Relation Age of Onset  . Diabetes Mother   . Hyperlipidemia Mother   . Hypertension  Mother   . Heart attack Mother 6  . Angina Mother        chronic problem  . Hypertension Father   . Stroke Father   . Cancer Brother     Social History Social History   Tobacco Use  . Smoking status: Current Every Day Smoker    Packs/day: 1.50    Years: 49.00    Pack years: 73.50    Types: Cigarettes  . Smokeless tobacco: Never Used  Substance Use Topics  . Alcohol use: Not Currently    Alcohol/week: 84.0 standard drinks    Types: 36 Cans of beer, 48 Shots of liquor per week    Comment: occ  . Drug use: Not Currently    Frequency: 1.0 times per week    Types: Marijuana, Cocaine    Comment: Cocaine + March 2017    Review of Systems Constitutional: No fever. Eyes: No visual changes. ENT: No sore throat. Cardiovascular: Denies chest pain. Respiratory: Denies shortness of breath. Gastrointestinal: No nausea, vomiting, diarrhea. Genitourinary: Negative for dysuria. Musculoskeletal: Negative for back pain. Skin: Negative for rash. Neurological: Negative for focal weakness or numbness.  ____________________________________________   PHYSICAL EXAM:  VITAL SIGNS: ED Triage Vitals  Enc Vitals Group     BP --      Pulse Rate 07/06/20 2143 (!) 59     Resp 07/06/20 2143 18     Temp 07/06/20 2143 98.8 F (37.1 C)     Temp Source 07/06/20 2143 Oral     SpO2 07/06/20 2143 95 %     Weight 07/06/20 2139 165 lb (74.8 kg)     Height 07/06/20 2139 5\' 4"  (1.626 m)     Head Circumference --      Peak Flow --      Pain Score 07/06/20 2139 3     Pain Loc --      Pain Edu? --      Excl. in [place]? --    CONSTITUTIONAL: Alert and oriented and responds appropriately to questions. Well-appearing; well-nourished HEAD: Normocephalic, atraumatic EYES: Conjunctivae clear, pupils appear equal, EOM appear intact ENT: normal nose; moist mucous membranes; TMs are clear bilaterally without erythema, purulence, bulging, perforation, effusion.  No cerumen impaction or sign of foreign body  in the left external auditory canal  but cerumen does partially occlude visualization of the right TM. No inflammation, erythema or drainage from the external auditory canal. No signs of mastoiditis. No pain with manipulation of the pinna bilaterally. NECK: Supple, normal ROM CARD: RRR; S1 and S2 appreciated; no murmurs, no clicks, no rubs, no gallops RESP: Normal chest excursion without splinting or tachypnea; breath sounds clear and equal bilaterally; no wheezes, no rhonchi, no rales, no hypoxia or respiratory distress, speaking full sentences ABD/GI: Normal bowel sounds; non-distended; soft, non-tender, no rebound, no guarding, no peritoneal signs, no hepatosplenomegaly BACK: The back appears normal EXT: Normal ROM in all joints; no deformity noted, no edema; no cyanosis SKIN: Normal color for age and race; warm; no rash on exposed skin NEURO: Moves all extremities equally, strength 5/5 in all 4 extremities, sensation to light touch intact diffusely, cranial nerves II through XII intact, normal speech, no nystagmus, no dysmetria to finger-to-nose testing bilaterally, gait deferred at this time PSYCH: The patient's mood and manner are appropriate.  ____________________________________________   LABS (all labs ordered are listed, but only abnormal results are displayed)  Labs Reviewed  BASIC METABOLIC PANEL - Abnormal; Notable for the following components:      Result Value   Glucose, Bld 107 (*)    BUN 27 (*)    Creatinine, Ser 1.23 (*)    GFR, Estimated 47 (*)    All other components within normal limits  CBC - Abnormal; Notable for the following components:   WBC 11.6 (*)    All other components within normal limits  URINALYSIS, ROUTINE W REFLEX MICROSCOPIC - Abnormal; Notable for the following components:   Color, Urine YELLOW (*)    APPearance CLEAR (*)    Hgb urine dipstick SMALL (*)    Leukocytes,Ua SMALL (*)    All other components within normal limits  TROPONIN I (HIGH  SENSITIVITY) - Abnormal; Notable for the following components:   Troponin I (High Sensitivity) 83 (*)    All other components within normal limits  TROPONIN I (HIGH SENSITIVITY) - Abnormal; Notable for the following components:   Troponin I (High Sensitivity) 82 (*)    All other components within normal limits  ETHANOL  MAGNESIUM   ____________________________________________  EKG   EKG Interpretation  Date/Time:  Wednesday July 06 2020 21:44:28 EST Ventricular Rate:  57 PR Interval:  198 QRS Duration: 88 QT Interval:  440 QTC Calculation: 428 R Axis:   68 Text Interpretation: Sinus bradycardia Possible Lateral infarct , age undetermined Abnormal ECG No significant change since last tracing Confirmed by Pryor Curia [id number]) on 07/06/2020 11:00:52 PM       ____________________________________________  RADIOLOGY Jessie Foot [place] Braun, personally viewed and evaluated these images (plain radiographs) as part of my medical decision making, as well as reviewing the written report by the radiologist.  ED MD interpretation: MRI shows no acute stroke.  Head CT shows no acute findings.  Official radiology report(s): CT Head Wo Contrast  Result Date: 07/06/2020 CLINICAL DATA:  Follow-up stroke. Small acute infarctions left basal ganglia and hemispheric deep white matter EXAM: CT HEAD WITHOUT CONTRAST TECHNIQUE: Contiguous axial images were obtained from the base of the skull through the vertex without intravenous contrast. COMPARISON:  MRI and CT studies 2 days ago. FINDINGS: Brain: Age related volume loss. No focal abnormality affecting the brainstem or cerebellum. Old small vessel with sooner infarctions of the left basal ganglia. Chronic small-vessel ischemic changes of the hemispheric deep white matter. I cannot specifically identify the punctate acute  infarctions shown on the previous MRI. No evidence of extension. No large vessel or cortical infarction. No mass, hemorrhage,  hydrocephalus or extra-axial collection. Vascular: There is atherosclerotic calcification of the major vessels at the base of the brain. Skull: Negative Sinuses/Orbits: Clear/normal Other: None IMPRESSION: No new or acute finding by CT. Old small vessel infarctions of the left basal ganglia. Chronic small-vessel ischemic changes of the hemispheric deep white matter. I cannot specifically identify the punctate acute infarctions shown on the previous MRI of 2 days ago. No evidence of extension, hemorrhage or mass effect. Electronically Signed   By: Nelson Chimes M.D.   On: 07/06/2020 22:40   MR BRAIN WO CONTRAST  Result Date: 07/07/2020 CLINICAL DATA:  Vertigo and recent CVA EXAM: MRI HEAD WITHOUT CONTRAST TECHNIQUE: Multiplanar, multiecho pulse sequences of the brain and surrounding structures were obtained without intravenous contrast. COMPARISON:  07/04/2020 FINDINGS: Brain: Unchanged small acute infarct of the left lentiform nucleus. Small left caudate head and body infarcts are also unchanged. No new site of acute ischemia. No acute hemorrhage. There is multifocal hyperintense T2-weighted signal within the white matter. Generalized volume loss without a clear lobar predilection. The midline structures are normal. Vascular: Major flow voids are preserved. Skull and upper cervical spine: Normal calvarium and skull base. Visualized upper cervical spine and soft tissues are normal. Sinuses/Orbits:No paranasal sinus fluid levels or advanced mucosal thickening. No mastoid or middle ear effusion. Normal orbits. IMPRESSION: Unchanged small acute infarct of the left lentiform nucleus and left caudate head and body. No new site of acute ischemia. Electronically Signed   By: Ulyses Jarred M.D.   On: 07/07/2020 02:15    ____________________________________________   PROCEDURES  Procedure(s) performed (including Critical Care):  Procedures  ____________________________________________   INITIAL IMPRESSION /  ASSESSMENT AND PLAN / ED COURSE  As part of my medical decision making, I reviewed the following data within the [facility] notes reviewed and incorporated, Labs reviewed, EKG interpreted NSR, Old chart reviewed and Notes from prior ED visits         Patient here with sudden onset vertigo.  Symptoms improving but not completely resolved.  NIH stroke scale at this time is 0.  I do not feel she would be a TPA candidate given low stroke scale and that symptoms are improving.  CT head shows no acute abnormality.  Given patient just had a CVA and is high risk for recurrence, will obtain MRI of the brain without contrast.  Will treat with gentle IV hydration, meclizine, Zofran.    Labs here unremarkable other than mildly elevated high-sensitivity troponin.  She denies any chest discomfort, shortness of breath.  Her EKG is nonischemic.  Second troponin pending.  This is a nonspecific finding.  She does have minimally elevated creatinine but has had normal high-sensitivity troponins before.  I have low suspicion for ACS, PE, dissection.  Hemoglobin stable.  No electrolyte derangements.  Will check urinalysis for signs of UTI.  ED PROGRESS  2:59 AM  Pt's labs have been unrevealing other than minimally elevated troponin that has been flat.  On reevaluation patient continues to deny any chest pain, discomfort, tightness, pressure or shortness of breath.  Again this is in the setting of chronic kidney disease which could be why it is minimally elevated.  Her EKG is nonischemic today and given she is asymptomatic, I do not feel this needs further emergent work-up.  Her labs show normal hemoglobin, normal electrolytes.  Urine does not  appear infected at this time.  MRI of the brain shows no acute intracranial abnormality.  She reports complete resolution of her symptoms after IV fluids, Zofran, meclizine and is able to tolerate p.o. and ambulate with steady gait without assistance.  I  suspect that this is peripheral in nature.  Will discharge with prescription of Zofran, meclizine.  I do not feel she needs admission at this time.  She is comfortable with this plan.  States family would be able to pick her up after 7:30 AM.   At this time, I do not feel there is any life-threatening condition present. I have reviewed, interpreted and discussed all results (EKG, imaging, lab, urine as appropriate) and exam findings with patient/family. I have reviewed nursing notes and appropriate previous records.  I feel the patient is safe to be discharged home without further emergent workup and can continue workup as an outpatient as needed. Discussed usual and customary return precautions. Patient/family verbalize understanding and are comfortable with this plan.  Outpatient follow-up has been provided as needed. All questions have been answered.  ____________________________________________   FINAL CLINICAL IMPRESSION(S) / ED DIAGNOSES  Final diagnoses:  Vertigo     ED Discharge Orders         Ordered    meclizine (ANTIVERT) 25 MG tablet  3 times daily PRN        07/07/20 0301    ondansetron (ZOFRAN ODT) 4 MG disintegrating tablet  Every 6 hours PRN        07/07/20 0301          *Please note:  CATHARINE KETTLEWELL was evaluated in Emergency Department on 07/07/2020 for the symptoms described in the history of present illness. She was evaluated in the context of the global COVID-19 pandemic, which necessitated consideration that the patient might be at risk for infection with the SARS-CoV-2 virus that causes COVID-19. Institutional protocols and algorithms that pertain to the evaluation of patients at risk for COVID-19 are in a state of rapid change based on information released by regulatory bodies including the CDC and federal and state organizations. These policies and algorithms were followed during the patient's care in the ED.  Some ED evaluations and interventions may be delayed  as a result of limited staffing during and the pandemic.*   Note:  This document was prepared using Dragon voice recognition software and may include unintentional dictation errors.   Donyae Kilner, Delice [place], DO 07/07/20 [phone]

## 2020-07-06 NOTE — ED Notes (Signed)
Pt to CT at this time.

## 2020-07-06 NOTE — ED Triage Notes (Signed)
Pt brought in via ems from home with dizziness   Pt discharged from [facility] yesterday.  Dx with stroke.  Pt has pressure in her head.  Pt has nausea.  Pt alert.  Speech clear.

## 2020-07-07 ENCOUNTER — Emergency Department: Payer: Medicare Other

## 2020-07-07 DIAGNOSIS — R42 Dizziness and giddiness: Secondary | ICD-10-CM | POA: Diagnosis not present

## 2020-07-07 LAB — URINALYSIS, ROUTINE W REFLEX MICROSCOPIC
Bacteria, UA: NONE SEEN
Bilirubin Urine: NEGATIVE
Glucose, UA: NEGATIVE mg/dL
Ketones, ur: NEGATIVE mg/dL
Nitrite: NEGATIVE
Protein, ur: NEGATIVE mg/dL
Specific Gravity, Urine: 1.006 (ref 1.005–1.030)
pH: 7 (ref 5.0–8.0)

## 2020-07-07 LAB — ETHANOL: Alcohol, Ethyl (B): <10 mg/dL (ref <10)

## 2020-07-07 LAB — MAGNESIUM: Magnesium: 2.2 mg/dL (ref 1.7–2.4)

## 2020-07-07 LAB — TROPONIN I (HIGH SENSITIVITY): Troponin I (High Sensitivity): 82 ng/L — ABNORMAL HIGH

## 2020-07-07 MED ORDER — MECLIZINE HCL 25 MG PO TABS: 25.0000 mg | ORAL_TABLET | Freq: Three times a day (TID) | ORAL | 0 refills | Status: DC | PRN

## 2020-07-07 MED ORDER — ONDANSETRON 4 MG PO TBDP: 4.0000 mg | ORAL_TABLET | Freq: Four times a day (QID) | ORAL | 0 refills | Status: DC | PRN

## 2020-07-07 NOTE — ED Notes (Signed)
Patient passed PO challenge. Ambulatory with steady gait without dizziness. MD aware.

## 2020-07-07 NOTE — ED Notes (Signed)
Patient provided with drink and snack for PO challenge. Plan to ambulate patient shortly.

## 2020-07-07 NOTE — ED Notes (Signed)
Spoke with charge RN regarding patient ride situation. Per patient, nephew cannot pick her up until approx 0730 this morning. Plan to place patient in recliner in waiting area to wait for ride.

## 2020-07-07 NOTE — ED Notes (Signed)
Patient back from MRI.

## 2020-07-07 NOTE — ED Notes (Signed)
Patient provided with phone to complete MRI screening.

## 2020-07-07 NOTE — ED Notes (Signed)
Patient out of room to MRI.

## 2020-07-07 NOTE — ED Notes (Signed)
Patient ambulatory to restroom with steady gait. Urine sample obtained.

## 2020-07-07 NOTE — Discharge Instructions (Signed)
Your labs, urine, MRI brain showed no acute changes today.  You have not had a new stroke.

## 2020-07-11 DIAGNOSIS — I350 Nonrheumatic aortic (valve) stenosis: Secondary | ICD-10-CM | POA: Diagnosis not present

## 2020-07-11 DIAGNOSIS — D631 Anemia in chronic kidney disease: Secondary | ICD-10-CM | POA: Diagnosis not present

## 2020-07-11 DIAGNOSIS — J449 Chronic obstructive pulmonary disease, unspecified: Secondary | ICD-10-CM | POA: Diagnosis not present

## 2020-07-11 DIAGNOSIS — R200 Anesthesia of skin: Secondary | ICD-10-CM | POA: Diagnosis not present

## 2020-07-11 DIAGNOSIS — I130 Hypertensive heart and chronic kidney disease with heart failure and stage 1 through stage 4 chronic kidney disease, or unspecified chronic kidney disease: Secondary | ICD-10-CM | POA: Diagnosis not present

## 2020-07-11 DIAGNOSIS — H5461 Unqualified visual loss, right eye, normal vision left eye: Secondary | ICD-10-CM | POA: Diagnosis not present

## 2020-07-11 DIAGNOSIS — I5032 Chronic diastolic (congestive) heart failure: Secondary | ICD-10-CM | POA: Diagnosis not present

## 2020-07-11 DIAGNOSIS — N1832 Chronic kidney disease, stage 3b: Secondary | ICD-10-CM | POA: Diagnosis not present

## 2020-07-11 DIAGNOSIS — I2510 Atherosclerotic heart disease of native coronary artery without angina pectoris: Secondary | ICD-10-CM | POA: Diagnosis not present

## 2020-07-11 DIAGNOSIS — I69398 Other sequelae of cerebral infarction: Secondary | ICD-10-CM | POA: Diagnosis not present

## 2020-07-11 DIAGNOSIS — I421 Obstructive hypertrophic cardiomyopathy: Secondary | ICD-10-CM | POA: Diagnosis not present

## 2020-07-12 ENCOUNTER — Other Ambulatory Visit: Payer: Self-pay

## 2020-07-12 ENCOUNTER — Encounter: Payer: Self-pay | Admitting: Internal Medicine

## 2020-07-12 ENCOUNTER — Ambulatory Visit (INDEPENDENT_AMBULATORY_CARE_PROVIDER_SITE_OTHER): Payer: Medicaid Other | Admitting: Internal Medicine

## 2020-07-12 VITALS — BP 138/64 | HR 62 | Temp 97.3°F | Resp 16 | Ht 64.0 in | Wt 168.8 lb

## 2020-07-12 DIAGNOSIS — R001 Bradycardia, unspecified: Secondary | ICD-10-CM | POA: Diagnosis not present

## 2020-07-12 DIAGNOSIS — F411 Generalized anxiety disorder: Secondary | ICD-10-CM

## 2020-07-12 DIAGNOSIS — I6389 Other cerebral infarction: Secondary | ICD-10-CM

## 2020-07-12 DIAGNOSIS — I10 Essential (primary) hypertension: Secondary | ICD-10-CM

## 2020-07-12 NOTE — Progress Notes (Signed)
[facility], [place] [address]  Internal MEDICINE  Office Visit Note  Patient Name: Sarah Barnes  [id number]  [id number]  Date of Service: 07/12/2020     Chief Complaint  Patient presents with  . Hospitalization Follow-up    Pt had stroke 07-03-20  . Depression  . Gastroesophageal Reflux  . Hyperlipidemia  . Hypertension  . COPD  . Anxiety  . Anemia  . Quality Metric Gaps    PNA     HPI Pt is here for recent hospital follow up. She has dx of possible of acute CVA. Presented with right sided facial numbness and blurred vision, continues to have pain in right eye loss of vision as well ( half of her field in right eye .  CT head showed Unchanged small acute infarct of the left lentiform nucleus and left caudate head and body. No new site of acute ischemia.CT of head showednew rounded low density is noted in left caudate nucleus concerningfor infarction of indeterminate age.CTA negative for LOV.  -TEE was already performed recently with no PFO noted Pt continues to have vertigo type of symptoms, had to go back to ED after discharge  C/O of anxiety and has been under stress, trying to quit, she has not been drinking   Current Medication: Outpatient Encounter Medications as of 07/12/2020  Medication Sig  . aspirin EC 81 MG EC tablet Take 1 tablet (81 mg total) by mouth daily.  Marland Kitchen atorvastatin (LIPITOR) 40 MG tablet Take 1 tablet (40 mg total) by mouth daily.  . busPIRone (BUSPAR) 15 MG tablet Take 1 tablet (15 mg total) by mouth 2 (two) times daily.  . carvedilol (COREG) 25 MG tablet Take 25 mg by mouth 2 (two) times daily.  . celecoxib (CELEBREX) 200 MG capsule Take 1 capsule (200 mg total) by mouth daily.  Marland Kitchen escitalopram (LEXAPRO) 10 MG tablet Take 1 tablet (10 mg total) by mouth daily.  . fenofibrate micronized (LOFIBRA) 134 MG capsule Take 1 capsule (134 mg total) by mouth daily before breakfast. NEEDS APPOINTMENT FOR FUTURE REFILLS   . folic acid (FOLVITE) 1 MG tablet Take 1 tablet (1 mg total) by mouth daily.  . furosemide (LASIX) 40 MG tablet Take 1 tablet (40 mg total) by mouth daily.  Marland Kitchen gabapentin (NEURONTIN) 100 MG capsule Start taking 1 capsule po QPM. May titrate dosing up to TID as needed and as tolerated .  Marland Kitchen meclizine (ANTIVERT) 25 MG tablet Take 1 tablet (25 mg total) by mouth 3 (three) times daily as needed for dizziness.  . Multiple Vitamin (MULTIVITAMIN WITH MINERALS) TABS tablet Take 1 tablet by mouth daily.  . nicotine (NICODERM CQ - DOSED IN MG/24 HOURS) 21 mg/24hr patch Place 1 patch (21 mg total) onto the skin daily.  . ondansetron (ZOFRAN ODT) 4 MG disintegrating tablet Take 1 tablet (4 mg total) by mouth every 6 (six) hours as needed for nausea or vomiting.  . pantoprazole (PROTONIX) 40 MG tablet Take 1 tablet (40 mg total) by mouth daily. NEEDS APPOINTMENT FOR FUTURE REFILLS  . tiZANidine (ZANAFLEX) 2 MG tablet Take 1 tablet (2 mg total) by mouth every 8 (eight) hours as needed for muscle spasms.  . traZODone (DESYREL) 100 MG tablet TAKE 1 TABLET BY MOUTH EVERYDAY AT BEDTIME  . valsartan (DIOVAN) 160 MG tablet Take 160 mg by mouth daily.   No facility-administered encounter medications on file as of 07/12/2020.    Surgical History: Past Surgical History:  Procedure Laterality Date  . ANAL FISTULECTOMY  1970s X 3  . BACK SURGERY    . HERNIA REPAIR    . KNEE ARTHROSCOPY Right   . LAPAROSCOPIC CHOLECYSTECTOMY    . LAPAROSCOPIC INCISIONAL / UMBILICAL / VENTRAL HERNIA REPAIR  08/13/2016   VHR w/mesh  . LUMBAR DISC SURGERY     "herniated disc"  . NASAL FRACTURE SURGERY  1970s X 2  . RIGHT/LEFT HEART CATH AND CORONARY ANGIOGRAPHY N/A 12/27/2016   Procedure: RIGHT/LEFT HEART CATH AND CORONARY ANGIOGRAPHY;  Surgeon: Leonie Man, MD;  Location: [place] INVASIVE CV LAB: Mild-moderate pulmonary hypertension.  Hyperdynamic ventricle noted.  EF 55-65% -hyperdynamic (unable to measure LVOT gradient).  Mildly  elevated very tortuous but angiographically normal coronary arteries, suggesting hypertensive heart disease  . SHOULDER ARTHROSCOPY WITH ROTATOR CUFF REPAIR Right 1990s?  . TONSILLECTOMY  1951  . TRANSTHORACIC ECHOCARDIOGRAM  12/2016    EF 60-65% with dynamic outflow tract obstruction at rest. Peak gradient 52 mmHg consistent with HOCM.  GRII DD area. Aortic sclerosis but no stenosis. Systolic anterior motion of mitral valve chordae. Mild mitral stenosis. Moderate LA dilation and mild RA dilation. Peak PA pressures 35 mmHg.  Marland Kitchen VENTRAL HERNIA REPAIR N/A 08/13/2016   Procedure: LAPAROSCOPIC VENTRAL HERNIA REPAIR WITH MESH;  Surgeon: Judeth Horn, MD;  Location: [place];  Service: General;  Laterality: N/A;    Medical History: Past Medical History:  Diagnosis Date  . Agoraphobia with panic disorder   . Anemia   . Arthritis    "all over" (08/13/2016)  . Chronic bronchitis ([place])   . COPD (chronic obstructive pulmonary disease) ([place])   . Depression   . Dyspnea    occ  . Dysrhythmia    palpitations occ due to anxiety  . ETOH abuse   . GAD (generalized anxiety disorder)   . GERD (gastroesophageal reflux disease)   . Headache    "daily til I got glasses; now have headache once in awhile" (08/13/2016)  . Hepatitis 1960   "isolated &  hospitalized for 2 weeks; don't know which kind of hepatitis"   . Hernia, hiatal   . High cholesterol   . Hypertension   . Hypertrophic obstructive cardiomyopathy ([place])   . Mild aortic stenosis   . Pneumonia    "once or twice" (08/13/2016)  . PONV (postoperative nausea and vomiting)   . Sciatica   . Stroke [facility])     Family History: Family History  Problem Relation Age of Onset  . Diabetes Mother   . Hyperlipidemia Mother   . Hypertension Mother   . Heart attack Mother 9  . Angina Mother        chronic problem  . Hypertension Father   . Stroke Father   . Cancer Brother     Social History   Socioeconomic History  . Marital status: Single     Spouse name: Not on file  . Number of children: Not on file  . Years of education: Not on file  . Highest education level: Not on file  Occupational History  . Not on file  Tobacco Use  . Smoking status: Current Every Day Smoker    Packs/day: 1.50    Years: 49.00    Pack years: 73.50    Types: Cigarettes  . Smokeless tobacco: Never Used  Substance and Sexual Activity  . Alcohol use: Not Currently    Alcohol/week: 84.0 standard drinks    Types: 36 Cans of beer, 48 Shots of liquor per  week    Comment: occ  . Drug use: Not Currently    Frequency: 1.0 times per week    Types: Marijuana, Cocaine    Comment: Cocaine + March 2017  . Sexual activity: Not Currently  Other Topics Concern  . Not on file  Social History Narrative   Cheyanna has generalized anxiety disorder and clearcut Agoraphobia.   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    She currently lives with her sister.   Currently denies substance abuse beyond Tobacco (1-1/2 PPD) & EtOH (beer, vodka) - not ready to discuss quitting.   Social Determinants of Health   Financial Resource Strain: Not on file  Food Insecurity: Not on file  Transportation Needs: Not on file  Physical Activity: Not on file  Stress: Not on file  Social Connections: Not on file  Intimate Partner Violence: Not on file      Review of Systems  Constitutional: Negative for chills, diaphoresis and fatigue.  HENT: Negative for ear pain, postnasal drip and sinus pressure.   Eyes: Negative for photophobia, discharge, redness, itching and visual disturbance.  Respiratory: Negative for cough, shortness of breath and wheezing.   Cardiovascular: Negative for chest pain, palpitations and leg swelling.  Gastrointestinal: Negative for abdominal pain, constipation, diarrhea, nausea and vomiting.  Genitourinary: Negative for dysuria and flank pain.   Musculoskeletal: Negative for arthralgias, back pain, gait problem and neck pain.  Skin: Negative for color change and rash.  Allergic/Immunologic: Negative for environmental allergies and food allergies.  Neurological: Negative for dizziness and headaches.  Hematological: Does not bruise/bleed easily.  Psychiatric/Behavioral: Negative for agitation, behavioral problems (depression) and hallucinations.    Vital Signs: BP 138/64   Pulse 62   Temp (!) 97.3 F (36.3 C)   Resp 16   Ht 5\' 4"  (1.626 m)   Wt 168 lb 12.8 oz (76.6 kg)   SpO2 97%   BMI 28.97 kg/m    Physical Exam Constitutional:      General: She is not in acute distress.    Appearance: She is well-developed. She is not diaphoretic.  HENT:     Head: Normocephalic and atraumatic.     Mouth/Throat:     Pharynx: No oropharyngeal exudate.  Eyes:     Extraocular Movements: Extraocular movements intact.  Neck:     Thyroid: No thyromegaly.     Vascular: No JVD.     Trachea: No tracheal deviation.  Cardiovascular:     Rate and Rhythm: Regular rhythm. Bradycardia present.     Heart sounds: Normal heart sounds. No murmur heard. No friction rub. No gallop.   Pulmonary:     Effort: Pulmonary effort is normal. No respiratory distress.     Breath sounds: No wheezing or rales.  Chest:     Chest wall: No tenderness.  Abdominal:     General: Bowel sounds are normal.     Palpations: Abdomen is soft.  Musculoskeletal:        General: Normal range of motion.     Cervical back: Normal range of motion and neck supple.  Lymphadenopathy:     Cervical: No cervical adenopathy.  Skin:    General: Skin is warm and dry.  Neurological:     Mental Status: She is alert and oriented to person, place, and time.     Cranial Nerves: No cranial nerve deficit.  Psychiatric:        Behavior: Behavior normal.        Thought Content: Thought content normal.        Judgment: Judgment normal.       Assessment/Plan: 1. Cerebrovascular  accident (CVA) due to other mechanism [facility]) Continue to monitor, neurology app, pt is not on any anticoagulant, will need to discuss  Might need to see ophthalmology   2. Bradycardia Will reduce Coreg to half tab po bid due to bradycardia, blood pressure is found to be low as well  - EKG 12-Lead  3. GAD (generalized anxiety disorder) Increase Lexapro to 10 mg ( 2 tabs a day)   4. Primary hypertension Will continue to monitor   General Counseling: Laquinta verbalizes understanding of the findings of todays visit and agrees with plan of treatment. I have discussed any further diagnostic evaluation that may be needed or ordered today. We also reviewed her medications today. she has been encouraged to call the office with any questions or concerns that should arise related to todays visit.    Counseling: Cardiac risk factor modification:  1. Control blood pressure. 2. Exercise as prescribed. 3. Follow low sodium, low fat diet. and low fat and low cholestrol diet. 4. Take ASA 81mg  once a day. 5. Restricted calories diet to lose weight.[place] Controlled Substance Database was reviewed by me.  Orders Placed This Encounter  Procedures  . EKG 12-Lead      I have reviewed all medical records from hospital follow up including radiology reports and consults from other physicians. Appropriate follow up diagnostics will be scheduled as needed. Patient/ Family understands the plan of treatment. Time spent 45 minutes.   Dr [name], MD Internal Medicine

## 2020-07-13 DIAGNOSIS — I5032 Chronic diastolic (congestive) heart failure: Secondary | ICD-10-CM | POA: Diagnosis not present

## 2020-07-13 DIAGNOSIS — D631 Anemia in chronic kidney disease: Secondary | ICD-10-CM | POA: Diagnosis not present

## 2020-07-13 DIAGNOSIS — I69398 Other sequelae of cerebral infarction: Secondary | ICD-10-CM | POA: Diagnosis not present

## 2020-07-13 DIAGNOSIS — I130 Hypertensive heart and chronic kidney disease with heart failure and stage 1 through stage 4 chronic kidney disease, or unspecified chronic kidney disease: Secondary | ICD-10-CM | POA: Diagnosis not present

## 2020-07-13 DIAGNOSIS — I2510 Atherosclerotic heart disease of native coronary artery without angina pectoris: Secondary | ICD-10-CM | POA: Diagnosis not present

## 2020-07-13 DIAGNOSIS — R200 Anesthesia of skin: Secondary | ICD-10-CM | POA: Diagnosis not present

## 2020-07-13 DIAGNOSIS — J449 Chronic obstructive pulmonary disease, unspecified: Secondary | ICD-10-CM | POA: Diagnosis not present

## 2020-07-13 DIAGNOSIS — I350 Nonrheumatic aortic (valve) stenosis: Secondary | ICD-10-CM | POA: Diagnosis not present

## 2020-07-13 DIAGNOSIS — H5461 Unqualified visual loss, right eye, normal vision left eye: Secondary | ICD-10-CM | POA: Diagnosis not present

## 2020-07-13 DIAGNOSIS — N1832 Chronic kidney disease, stage 3b: Secondary | ICD-10-CM | POA: Diagnosis not present

## 2020-07-13 DIAGNOSIS — I421 Obstructive hypertrophic cardiomyopathy: Secondary | ICD-10-CM | POA: Diagnosis not present

## 2020-07-14 ENCOUNTER — Telehealth: Payer: Self-pay | Admitting: *Deleted

## 2020-07-14 NOTE — Telephone Encounter (Signed)
-----   Message from Ciro Backer sent at 07/14/2020 12:56 PM EST ----- Regarding: RE: 3/2 LOOP I have this auth pending, however, they would like the LOOP evaluation office visit note to determine approval. I will wait until after the appt and the note is complete to send additonal information to the insurance company.  Thank you,  Estill Bamberg  ----- Message ----- From: Ciro Backer Sent: 07/11/2020  12:00 AM EST To: Ciro Backer Subject: 3/2 LOOP                                        ----- Message ----- From: Emily Filbert, RN Sent: 07/06/2020  10:47 AM EST To: Emily Filbert, RN, #  Patient pending loop recorder implant in office on 07/20/20 in [place] with Dr. Quentin Ore  Dx- stroke

## 2020-07-14 NOTE — Telephone Encounter (Signed)
Noted- added to appt notes.

## 2020-07-18 DIAGNOSIS — I69398 Other sequelae of cerebral infarction: Secondary | ICD-10-CM | POA: Diagnosis not present

## 2020-07-18 DIAGNOSIS — I2510 Atherosclerotic heart disease of native coronary artery without angina pectoris: Secondary | ICD-10-CM | POA: Diagnosis not present

## 2020-07-18 DIAGNOSIS — R200 Anesthesia of skin: Secondary | ICD-10-CM | POA: Diagnosis not present

## 2020-07-18 DIAGNOSIS — I130 Hypertensive heart and chronic kidney disease with heart failure and stage 1 through stage 4 chronic kidney disease, or unspecified chronic kidney disease: Secondary | ICD-10-CM | POA: Diagnosis not present

## 2020-07-18 DIAGNOSIS — I421 Obstructive hypertrophic cardiomyopathy: Secondary | ICD-10-CM | POA: Diagnosis not present

## 2020-07-18 DIAGNOSIS — J449 Chronic obstructive pulmonary disease, unspecified: Secondary | ICD-10-CM | POA: Diagnosis not present

## 2020-07-18 DIAGNOSIS — H5461 Unqualified visual loss, right eye, normal vision left eye: Secondary | ICD-10-CM | POA: Diagnosis not present

## 2020-07-18 DIAGNOSIS — I350 Nonrheumatic aortic (valve) stenosis: Secondary | ICD-10-CM | POA: Diagnosis not present

## 2020-07-18 DIAGNOSIS — D631 Anemia in chronic kidney disease: Secondary | ICD-10-CM | POA: Diagnosis not present

## 2020-07-18 DIAGNOSIS — N1832 Chronic kidney disease, stage 3b: Secondary | ICD-10-CM | POA: Diagnosis not present

## 2020-07-18 DIAGNOSIS — I5032 Chronic diastolic (congestive) heart failure: Secondary | ICD-10-CM | POA: Diagnosis not present

## 2020-07-20 ENCOUNTER — Encounter: Payer: Self-pay | Admitting: Cardiology

## 2020-07-20 ENCOUNTER — Ambulatory Visit (INDEPENDENT_AMBULATORY_CARE_PROVIDER_SITE_OTHER): Payer: Medicare Other | Admitting: Cardiology

## 2020-07-20 ENCOUNTER — Other Ambulatory Visit: Payer: Self-pay

## 2020-07-20 VITALS — BP 130/72 | HR 52 | Ht 64.0 in | Wt 168.0 lb

## 2020-07-20 DIAGNOSIS — I639 Cerebral infarction, unspecified: Secondary | ICD-10-CM

## 2020-07-20 NOTE — Patient Instructions (Addendum)
Medication Instructions:  Your physician recommends that you continue on your current medications as directed. Please refer to the Current Medication list given to you today. *If you need a refill on your cardiac medications before your next appointment, please call your pharmacy*  Lab Work: None ordered. If you have labs (blood work) drawn today and your tests are completely normal, you will receive your results only by: Marland Kitchen MyChart Message (if you have MyChart) OR . A paper copy in the mail If you have any lab test that is abnormal or we need to change your treatment, we will call you to review the results.  Testing/Procedures: None ordered.  Follow-Up: At [facility], you and your health needs are our priority.  As part of our continuing mission to provide you with exceptional heart care, we have created designated Provider Care Teams.  These Care Teams include your primary Cardiologist (physician) and Advanced Practice Providers (APPs -  Physician Assistants and Nurse Practitioners) who all work together to provide you with the care you need, when you need it.  Your next appointment:   Your physician wants you to follow-up in: August 17, 2020 at 1:40 pm with Dr. Quentin Ore at the [facility] office.  There are NO special instructions for this procedure.   Implantable Loop Recorder Placement  An implantable loop recorder is a small electronic device that is placed under the skin of your chest. The device records the electrical activity of your heart over a long period of time. Your health care provider can download these recordings to monitor your heart. You may need an implantable loop recorder if you have periods of abnormal heart activity (arrhythmias) or unexplained fainting (syncope). The recorder can be left in place for 1 year or longer.

## 2020-07-20 NOTE — Progress Notes (Signed)
Electrophysiology Office Note:    Date:  07/20/2020   ID:  TINA GRUNER, DOB 09/26/1950, MRN [id number]  PCP:  [name], MD  [place] Cardiologist:  No primary care provider on file.  [facility] Electrophysiologist:  Vickie Epley, MD   Referring MD: [name], MD   Chief Complaint: cryptogenic stroke  History of Present Illness:    Sarah Barnes is a 70 y.o. female who presents for an evaluation of cryptogenic stroke at the request of Dr Humphrey Rolls. Their medical history includes COPD, depression, hypertrophic cardiomyopathy, EtOH abuse, HTN.   She was hospitalized 07/04/2020 through 07/05/2020 for cryptogenic stroke. She presented with right sided facial numbness and blurry vision in the right eye. CT scan showed a low density caudate nucleus concenring for infarction. TEE which showed no cause. She was started on aspirin 81mg  by mouth daily. Telemetry while inpatient did not show any evidence of atrial arrhythmias.   Past Medical History:  Diagnosis Date  . Agoraphobia with panic disorder   . Anemia   . Arthritis    "all over" (08/13/2016)  . Chronic bronchitis ([place])   . COPD (chronic obstructive pulmonary disease) ([place])   . Depression   . Dyspnea    occ  . Dysrhythmia    palpitations occ due to anxiety  . ETOH abuse   . GAD (generalized anxiety disorder)   . GERD (gastroesophageal reflux disease)   . Headache    "daily til I got glasses; now have headache once in awhile" (08/13/2016)  . Hepatitis 1960   "isolated &  hospitalized for 2 weeks; don't know which kind of hepatitis"   . Hernia, hiatal   . High cholesterol   . Hypertension   . Hypertrophic obstructive cardiomyopathy ([place])   . Mild aortic stenosis   . Pneumonia    "once or twice" (08/13/2016)  . PONV (postoperative nausea and vomiting)   . Sciatica   . Stroke [facility])     Past Surgical History:  Procedure Laterality Date  . ANAL FISTULECTOMY  1970s X 3  . BACK SURGERY    . HERNIA  REPAIR    . KNEE ARTHROSCOPY Right   . LAPAROSCOPIC CHOLECYSTECTOMY    . LAPAROSCOPIC INCISIONAL / UMBILICAL / VENTRAL HERNIA REPAIR  08/13/2016   VHR w/mesh  . LUMBAR DISC SURGERY     "herniated disc"  . NASAL FRACTURE SURGERY  1970s X 2  . RIGHT/LEFT HEART CATH AND CORONARY ANGIOGRAPHY N/A 12/27/2016   Procedure: RIGHT/LEFT HEART CATH AND CORONARY ANGIOGRAPHY;  Surgeon: Leonie Man, MD;  Location: [place] INVASIVE CV LAB: Mild-moderate pulmonary hypertension.  Hyperdynamic ventricle noted.  EF 55-65% -hyperdynamic (unable to measure LVOT gradient).  Mildly elevated very tortuous but angiographically normal coronary arteries, suggesting hypertensive heart disease  . SHOULDER ARTHROSCOPY WITH ROTATOR CUFF REPAIR Right 1990s?  . TONSILLECTOMY  1951  . TRANSTHORACIC ECHOCARDIOGRAM  12/2016    EF 60-65% with dynamic outflow tract obstruction at rest. Peak gradient 52 mmHg consistent with HOCM.  GRII DD area. Aortic sclerosis but no stenosis. Systolic anterior motion of mitral valve chordae. Mild mitral stenosis. Moderate LA dilation and mild RA dilation. Peak PA pressures 35 mmHg.  Marland Kitchen VENTRAL HERNIA REPAIR N/A 08/13/2016   Procedure: LAPAROSCOPIC VENTRAL HERNIA REPAIR WITH MESH;  Surgeon: Judeth Horn, MD;  Location: [place];  Service: General;  Laterality: N/A;    Current Medications: Current Meds  Medication Sig  . aspirin EC 81 MG EC tablet Take  1 tablet (81 mg total) by mouth daily.  Marland Kitchen atorvastatin (LIPITOR) 40 MG tablet Take 1 tablet (40 mg total) by mouth daily.  . busPIRone (BUSPAR) 15 MG tablet Take 1 tablet (15 mg total) by mouth 2 (two) times daily.  . carvedilol (COREG) 12.5 MG tablet Take by mouth 2 (two) times daily.  . celecoxib (CELEBREX) 200 MG capsule Take 1 capsule (200 mg total) by mouth daily.  Marland Kitchen escitalopram (LEXAPRO) 10 MG tablet Take 1 tablet (10 mg total) by mouth daily.  . fenofibrate micronized (LOFIBRA) 134 MG capsule Take 1 capsule (134 mg total) by mouth daily before  breakfast. NEEDS APPOINTMENT FOR FUTURE REFILLS  . folic acid (FOLVITE) 1 MG tablet Take 1 tablet (1 mg total) by mouth daily.  . furosemide (LASIX) 40 MG tablet Take 1 tablet (40 mg total) by mouth daily.  Marland Kitchen gabapentin (NEURONTIN) 100 MG capsule Take 100 mg by mouth 3 (three) times daily. 2 in the morning, 1 in the afternoon and 2 at night.  . meclizine (ANTIVERT) 25 MG tablet Take 1 tablet (25 mg total) by mouth 3 (three) times daily as needed for dizziness.  . Multiple Vitamin (MULTIVITAMIN WITH MINERALS) TABS tablet Take 1 tablet by mouth daily.  . nicotine (NICODERM CQ - DOSED IN MG/24 HOURS) 21 mg/24hr patch Place 1 patch (21 mg total) onto the skin daily.  . ondansetron (ZOFRAN ODT) 4 MG disintegrating tablet Take 1 tablet (4 mg total) by mouth every 6 (six) hours as needed for nausea or vomiting.  . pantoprazole (PROTONIX) 40 MG tablet Take 1 tablet (40 mg total) by mouth daily. NEEDS APPOINTMENT FOR FUTURE REFILLS  . tiZANidine (ZANAFLEX) 2 MG tablet Take 1 tablet (2 mg total) by mouth every 8 (eight) hours as needed for muscle spasms.  . traZODone (DESYREL) 100 MG tablet TAKE 1 TABLET BY MOUTH EVERYDAY AT BEDTIME  . valsartan (DIOVAN) 160 MG tablet Take 160 mg by mouth daily.  . [DISCONTINUED] gabapentin (NEURONTIN) 100 MG capsule Start taking 1 capsule po QPM. May titrate dosing up to TID as needed and as tolerated . (Patient taking differently: Start taking 2 capsule po QPM. May titrate dosing up to TID as needed and as tolerated .)     Allergies:   Patient has no known allergies.   Social History   Socioeconomic History  . Marital status: Single    Spouse name: Not on file  . Number of children: Not on file  . Years of education: Not on file  . Highest education level: Not on file  Occupational History  . Not on file  Tobacco Use  . Smoking status: Current Every Day Smoker    Packs/day: 1.50    Years: 49.00    Pack years: 73.50    Types: Cigarettes  . Smokeless tobacco:  Never Used  Substance and Sexual Activity  . Alcohol use: Not Currently    Alcohol/week: 84.0 standard drinks    Types: 36 Cans of beer, 48 Shots of liquor per week    Comment: occ  . Drug use: Not Currently    Frequency: 1.0 times per week    Types: Marijuana, Cocaine    Comment: Cocaine + March 2017  . Sexual activity: Not Currently  Other Topics Concern  . Not on file  Social History Narrative   Nekisha has generalized anxiety disorder and clearcut Agoraphobia.   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    She currently lives with her sister.   Currently denies substance abuse beyond Tobacco (1-1/2 PPD) & EtOH (beer, vodka) - not ready to discuss quitting.   Social Determinants of Health   Financial Resource Strain: Not on file  Food Insecurity: Not on file  Transportation Needs: Not on file  Physical Activity: Not on file  Stress: Not on file  Social Connections: Not on file     Family History: The patient's family history includes Angina in her mother; Cancer in her brother; Diabetes in her mother; Heart attack (age of onset: 98) in her mother; Hyperlipidemia in her mother; Hypertension in her father and mother; Stroke in her father.  ROS:   Please see the history of present illness.    All other systems reviewed and are negative.  EKGs/Labs/Other Studies Reviewed:    The following studies were reviewed today:   EKG:  The ekg ordered today demonstrates sinus rhythm.   Recent Labs: 11/20/2019: TSH 1.470 07/04/2020: ALT 14; B Natriuretic Peptide 137.4 07/06/2020: BUN 27; Creatinine, Ser 1.23; Hemoglobin 13.6; Magnesium 2.2; Platelets 272; Potassium 3.9; Sodium 136  Recent Lipid Panel    Component Value Date/Time   CHOL 174 07/05/2020 0545   CHOL 204 (H) 11/20/2019 1317   TRIG 237 (H) 07/05/2020 0545   HDL 37 (L) 07/05/2020 0545   HDL 47 11/20/2019 1317   CHOLHDL 4.7  07/05/2020 0545   VLDL 47 (H) 07/05/2020 0545   LDLCALC 90 07/05/2020 0545   LDLCALC 123 (H) 11/20/2019 1317   LDLDIRECT 66.0 10/20/2015 0841    Physical Exam:    VS:  BP 130/72 (BP Location: Left Arm, Patient Position: Sitting, Cuff Size: Normal)   Pulse (!) 52   Ht 5\' 4"  (1.626 m)   Wt 168 lb (76.2 kg)   SpO2 95%   BMI 28.84 kg/m     Wt Readings from Last 3 Encounters:  07/20/20 168 lb (76.2 kg)  07/12/20 168 lb 12.8 oz (76.6 kg)  07/06/20 165 lb (74.8 kg)     GEN:  Well nourished, well developed in no acute distress HEENT: Normal NECK: No JVD; No carotid bruits LYMPHATICS: No lymphadenopathy CARDIAC: RRR, no murmurs, rubs, gallops RESPIRATORY:  Clear to auscultation without rales, wheezing or rhonchi  ABDOMEN: Soft, non-tender, non-distended MUSCULOSKELETAL:  No edema; No deformity  SKIN: Warm and dry NEUROLOGIC:  Alert and oriented x 3 PSYCHIATRIC:  Normal affect   ASSESSMENT:    1. Cryptogenic stroke (HCC)    PLAN:    In order of problems listed above:  1. Cryptogenic stroke Patient with recent hospitalization for cryptogenic stroke. Inpatient telemetry did not reveal any evidence of atrial fibrillation. Given the CT findings of a possible cardioembolic stroke, favor implanting a loop recorder for ongoing surveillance for atrial fibrillation. Will plan to get this scheduled for the patient. In the meantime, continue aspirin 81mg  by mouth daily. Cont lipitor.  Medication Adjustments/Labs and Tests Ordered: Current medicines are reviewed at length with the patient today.  Concerns regarding medicines are outlined above.  Orders Placed This Encounter  Procedures  . EKG 12-Lead   No orders of the defined types were placed in this encounter.    Signed, Lars Mage, MD, [facility]  07/20/2020 7:48 PM    Electrophysiology [place]

## 2020-07-25 ENCOUNTER — Ambulatory Visit (INDEPENDENT_AMBULATORY_CARE_PROVIDER_SITE_OTHER): Payer: Medicare Other | Admitting: Physician Assistant

## 2020-07-25 ENCOUNTER — Encounter: Payer: Self-pay | Admitting: Physician Assistant

## 2020-07-25 DIAGNOSIS — M79604 Pain in right leg: Secondary | ICD-10-CM

## 2020-07-25 DIAGNOSIS — I739 Peripheral vascular disease, unspecified: Secondary | ICD-10-CM | POA: Diagnosis not present

## 2020-07-25 DIAGNOSIS — H5340 Unspecified visual field defects: Secondary | ICD-10-CM | POA: Diagnosis not present

## 2020-07-25 DIAGNOSIS — R001 Bradycardia, unspecified: Secondary | ICD-10-CM | POA: Diagnosis not present

## 2020-07-25 DIAGNOSIS — I10 Essential (primary) hypertension: Secondary | ICD-10-CM

## 2020-07-25 DIAGNOSIS — I6389 Other cerebral infarction: Secondary | ICD-10-CM | POA: Diagnosis not present

## 2020-07-25 DIAGNOSIS — M79605 Pain in left leg: Secondary | ICD-10-CM

## 2020-07-25 DIAGNOSIS — F411 Generalized anxiety disorder: Secondary | ICD-10-CM | POA: Diagnosis not present

## 2020-07-25 DIAGNOSIS — F17218 Nicotine dependence, cigarettes, with other nicotine-induced disorders: Secondary | ICD-10-CM

## 2020-07-25 MED ORDER — ATORVASTATIN CALCIUM 40 MG PO TABS: 40.0000 mg | ORAL_TABLET | Freq: Every day | ORAL | 1 refills | Status: DC

## 2020-07-25 NOTE — Progress Notes (Signed)
[facility] [place], [place] [address]  Internal MEDICINE  Office Visit Note  Patient Name: Sarah Barnes  [id number]  [id number]  Date of Service: 08/02/2020  Chief Complaint  Patient presents with  . Follow-up  . Hypertension  . Hyperlipidemia  . Gastroesophageal Reflux  . Depression    HPI  -Pt returns for 10day f/u. She saw cardiology for evaluation due to cryptogenic stroke that she was hospitalized for from 07/04/2020-07/05/2020. Cardiology plans to place loop recorder on the 30th since no evidence of afib seen on tele or EKG. They recommend she continue ASA and lipitor. Pt reports her R eyebrow has gone back to normal since stroke, but R eye is still a problem. She cant see out of half of her eye and it is very annoying. She would like a referral for ophthalmology. -Bradycardia last visit-decreased to half tab of coreg BID.  Pulse and BP still borderline low on the lowered dose. Will continue lower dose. -Was told by old doctor that she had a blockage in L groin, this was about a year ago. Pain in legs esp at night. She is smoking 1 cig per hour currently I nan effort to cut back. Per pt she did not increase the lexapro and has only been taking the 1 tab. She will make this change now.  MRI 07/07/20 IMPRESSION: Unchanged small acute infarct of the left lentiform nucleus and left caudate head and body. No new site of acute ischemia.  CTA neck 07/04/20 IMPRESSION: 1. No large vessel occlusion, hemodynamically significant stenosis, or evidence of dissection. 2. Mild luminal irregularity of the right M1/MCA, may represent mild intracranial atherosclerotic disease without hemodynamically significant stenosis.   Current Medication: Outpatient Encounter Medications as of 07/25/2020  Medication Sig  . aspirin EC 81 MG EC tablet Take 1 tablet (81 mg total) by mouth daily.  . busPIRone (BUSPAR) 15 MG tablet Take 1 tablet (15 mg total) by mouth 2 (two)  times daily.  . carvedilol (COREG) 12.5 MG tablet Take by mouth 2 (two) times daily.  . celecoxib (CELEBREX) 200 MG capsule Take 1 capsule (200 mg total) by mouth daily.  Marland Kitchen escitalopram (LEXAPRO) 10 MG tablet Take 1 tablet (10 mg total) by mouth daily.  . fenofibrate micronized (LOFIBRA) 134 MG capsule Take 1 capsule (134 mg total) by mouth daily before breakfast. NEEDS APPOINTMENT FOR FUTURE REFILLS  . folic acid (FOLVITE) 1 MG tablet Take 1 tablet (1 mg total) by mouth daily.  . furosemide (LASIX) 40 MG tablet Take 1 tablet (40 mg total) by mouth daily.  Marland Kitchen gabapentin (NEURONTIN) 100 MG capsule Take 100 mg by mouth 3 (three) times daily. 2 in the morning, 1 in the afternoon and 2 at night.  . meclizine (ANTIVERT) 25 MG tablet Take 1 tablet (25 mg total) by mouth 3 (three) times daily as needed for dizziness.  . Multiple Vitamin (MULTIVITAMIN WITH MINERALS) TABS tablet Take 1 tablet by mouth daily.  . nicotine (NICODERM CQ - DOSED IN MG/24 HOURS) 21 mg/24hr patch Place 1 patch (21 mg total) onto the skin daily.  . ondansetron (ZOFRAN ODT) 4 MG disintegrating tablet Take 1 tablet (4 mg total) by mouth every 6 (six) hours as needed for nausea or vomiting.  . pantoprazole (PROTONIX) 40 MG tablet Take 1 tablet (40 mg total) by mouth daily. NEEDS APPOINTMENT FOR FUTURE REFILLS  . tiZANidine (ZANAFLEX) 2 MG tablet Take 1 tablet (2 mg total) by mouth every 8 (eight)  hours as needed for muscle spasms.  . traZODone (DESYREL) 100 MG tablet TAKE 1 TABLET BY MOUTH EVERYDAY AT BEDTIME  . valsartan (DIOVAN) 160 MG tablet Take 160 mg by mouth daily.  . [DISCONTINUED] atorvastatin (LIPITOR) 40 MG tablet Take 1 tablet (40 mg total) by mouth daily.  Marland Kitchen atorvastatin (LIPITOR) 40 MG tablet Take 1 tablet (40 mg total) by mouth daily.   No facility-administered encounter medications on file as of 07/25/2020.    Surgical History: Past Surgical History:  Procedure Laterality Date  . ANAL FISTULECTOMY  1970s X 3  .  BACK SURGERY    . HERNIA REPAIR    . KNEE ARTHROSCOPY Right   . LAPAROSCOPIC CHOLECYSTECTOMY    . LAPAROSCOPIC INCISIONAL / UMBILICAL / VENTRAL HERNIA REPAIR  08/13/2016   VHR w/mesh  . LUMBAR DISC SURGERY     "herniated disc"  . NASAL FRACTURE SURGERY  1970s X 2  . RIGHT/LEFT HEART CATH AND CORONARY ANGIOGRAPHY N/A 12/27/2016   Procedure: RIGHT/LEFT HEART CATH AND CORONARY ANGIOGRAPHY;  Surgeon: Leonie Man, MD;  Location: [place] INVASIVE CV LAB: Mild-moderate pulmonary hypertension.  Hyperdynamic ventricle noted.  EF 55-65% -hyperdynamic (unable to measure LVOT gradient).  Mildly elevated very tortuous but angiographically normal coronary arteries, suggesting hypertensive heart disease  . SHOULDER ARTHROSCOPY WITH ROTATOR CUFF REPAIR Right 1990s?  . TONSILLECTOMY  1951  . TRANSTHORACIC ECHOCARDIOGRAM  12/2016    EF 60-65% with dynamic outflow tract obstruction at rest. Peak gradient 52 mmHg consistent with HOCM.  GRII DD area. Aortic sclerosis but no stenosis. Systolic anterior motion of mitral valve chordae. Mild mitral stenosis. Moderate LA dilation and mild RA dilation. Peak PA pressures 35 mmHg.  Marland Kitchen VENTRAL HERNIA REPAIR N/A 08/13/2016   Procedure: LAPAROSCOPIC VENTRAL HERNIA REPAIR WITH MESH;  Surgeon: Judeth Horn, MD;  Location: [place];  Service: General;  Laterality: N/A;    Medical History: Past Medical History:  Diagnosis Date  . Agoraphobia with panic disorder   . Anemia   . Arthritis    "all over" (08/13/2016)  . Chronic bronchitis ([place])   . COPD (chronic obstructive pulmonary disease) ([place])   . Depression   . Dyspnea    occ  . Dysrhythmia    palpitations occ due to anxiety  . ETOH abuse   . GAD (generalized anxiety disorder)   . GERD (gastroesophageal reflux disease)   . Headache    "daily til I got glasses; now have headache once in awhile" (08/13/2016)  . Hepatitis 1960   "isolated &  hospitalized for 2 weeks; don't know which kind of hepatitis"   . Hernia, hiatal    . High cholesterol   . Hypertension   . Hypertrophic obstructive cardiomyopathy ([place])   . Mild aortic stenosis   . Pneumonia    "once or twice" (08/13/2016)  . PONV (postoperative nausea and vomiting)   . Sciatica   . Stroke [facility])     Family History: Family History  Problem Relation Age of Onset  . Diabetes Mother   . Hyperlipidemia Mother   . Hypertension Mother   . Heart attack Mother 78  . Angina Mother        chronic problem  . Hypertension Father   . Stroke Father   . Cancer Brother     Social History   Socioeconomic History  . Marital status: Single    Spouse name: Not on file  . Number of children: Not on file  . Years of education: Not  on file  . Highest education level: Not on file  Occupational History  . Not on file  Tobacco Use  . Smoking status: Current Every Day Smoker    Packs/day: 1.50    Years: 49.00    Pack years: 73.50    Types: Cigarettes  . Smokeless tobacco: Never Used  Substance and Sexual Activity  . Alcohol use: Not Currently    Alcohol/week: 84.0 standard drinks    Types: 36 Cans of beer, 48 Shots of liquor per week    Comment: occ  . Drug use: Not Currently    Frequency: 1.0 times per week    Types: Marijuana, Cocaine    Comment: Cocaine + March 2017  . Sexual activity: Not Currently  Other Topics Concern  . Not on file  Social History Narrative   Noelene has generalized anxiety disorder and clearcut Agoraphobia.   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    She currently lives with her sister.   Currently denies substance abuse beyond Tobacco (1-1/2 PPD) & EtOH (beer, vodka) - not ready to discuss quitting.   Social Determinants of Health   Financial Resource Strain: Not on file  Food Insecurity: Not on file  Transportation Needs: Not on file  Physical Activity: Not on file  Stress: Not on file  Social Connections: Not on file   Intimate Partner Violence: Not on file      Review of Systems  Constitutional: Negative for chills, fatigue and unexpected weight change.  HENT: Negative for congestion, postnasal drip, rhinorrhea, sneezing and sore throat.   Eyes: Positive for visual disturbance. Negative for pain and redness.       Partial loss of vision in R eye  Respiratory: Negative for cough, chest tightness and shortness of breath.   Cardiovascular: Negative for chest pain and palpitations.  Gastrointestinal: Negative for abdominal pain, constipation, diarrhea, nausea and vomiting.  Genitourinary: Negative for dysuria and frequency.  Musculoskeletal: Positive for arthralgias and back pain. Negative for joint swelling and neck pain.       Pain in both legs, especially at night  Skin: Negative for color change and rash.  Neurological: Negative.  Negative for tremors, speech difficulty and numbness.  Hematological: Negative for adenopathy. Does not bruise/bleed easily.  Psychiatric/Behavioral: Negative for behavioral problems (Depression), sleep disturbance and suicidal ideas.    Vital Signs: BP 124/70   Pulse 62   Temp 97.8 F (36.6 C)   Resp 16   Ht 5' 3.5" (1.613 m)   Wt 170 lb (77.1 kg)   SpO2 94%   BMI 29.64 kg/m    Physical Exam Vitals and nursing note reviewed.  Constitutional:      General: She is not in acute distress.    Appearance: She is well-developed. She is not diaphoretic.  HENT:     Head: Normocephalic and atraumatic.     Mouth/Throat:     Pharynx: No oropharyngeal exudate.  Eyes:     Pupils: Pupils are equal, round, and reactive to light.  Neck:     Thyroid: No thyromegaly.     Vascular: No JVD.     Trachea: No tracheal deviation.  Cardiovascular:     Rate and Rhythm: Normal rate and regular rhythm.     Pulses: Normal pulses.     Heart sounds: Normal heart sounds. No murmur heard. No friction rub. No  gallop.   Pulmonary:     Effort: Pulmonary effort is normal. No  respiratory distress.     Breath sounds: No wheezing or rales.  Chest:     Chest wall: No tenderness.  Abdominal:     General: Bowel sounds are normal.     Palpations: Abdomen is soft.  Musculoskeletal:        General: Normal range of motion.     Cervical back: Normal range of motion and neck supple.  Lymphadenopathy:     Cervical: No cervical adenopathy.  Skin:    General: Skin is warm and dry.  Neurological:     Mental Status: She is alert and oriented to person, place, and time.     Cranial Nerves: No cranial nerve deficit.  Psychiatric:        Behavior: Behavior normal.        Thought Content: Thought content normal.        Judgment: Judgment normal.        Assessment/Plan: 1. Cerebrovascular accident (CVA) due to other mechanism ([place]) Cryptogenic stroke. Seen by cardiology with plan for loop recorder. Continue ASA and lipitor. Will send for ophthalmology referral due to vision loss in R eye since stroke. - Ambulatory referral to Ophthalmology  2. Primary hypertension Stable since decreasing coreg. Continue to monitor.  3. GAD (generalized anxiety disorder) Stable. Pt did not increase Lexapro last visit. Will double for total of 20mg  daily to help with anxiety while also trying to quit smoking.  4. Bradycardia Stable since decreasing coreg to 1/2 tab BID. Will continue to monitor. She has f/u with cardiology for loop recorder placement on the 30th.  5. Visual field cut R eye partial loss of vision since stroke, will refer to ophthalmology. - Ambulatory referral to Ophthalmology  6. Bilateral leg pain Leg pain especially at night. Previously told she has a blockage on L groin. Borderline for PAD. Will plan for arterial dopplers for further investigation especially given HLD, and smoking hx. - POCT ABI Screening Pilot No Charge  7. Cigarette nicotine dependence with other nicotine-induced disorder Smoking cessation counseling: 1. Pt acknowledges the risks of long  term smoking, she will try to quite smoking. 2. Options for different medications including nicotine products, chewing gum, patch etc, Wellbutrin and Chantix is discussed 3. Goal and date of compete cessation is discussed 4. Total time spent in smoking cessation is 10 min.    General Counseling: Mckaylie verbalizes understanding of the findings of todays visit and agrees with plan of treatment. I have discussed any further diagnostic evaluation that may be needed or ordered today. We also reviewed her medications today. she has been encouraged to call the office with any questions or concerns that should arise related to todays visit.    Orders Placed This Encounter  Procedures  . Ambulatory referral to Ophthalmology  . POCT ABI Screening Pilot No Charge    Meds ordered this encounter  Medications  . atorvastatin (LIPITOR) 40 MG tablet    Sig: Take 1 tablet (40 mg total) by mouth daily.    Dispense:  90 tablet    Refill:  1    This patient was seen by Drema Dallas, PA-C in collaboration with Dr. Clayborn Bigness as a part of collaborative care agreement.   Total time spent:30 Minutes Time spent includes review of chart, medications, test results, and follow up plan with the patient.      Dr [name] Internal medicine

## 2020-07-26 ENCOUNTER — Telehealth: Payer: Self-pay

## 2020-07-26 DIAGNOSIS — H5461 Unqualified visual loss, right eye, normal vision left eye: Secondary | ICD-10-CM | POA: Diagnosis not present

## 2020-07-26 DIAGNOSIS — I2510 Atherosclerotic heart disease of native coronary artery without angina pectoris: Secondary | ICD-10-CM | POA: Diagnosis not present

## 2020-07-26 DIAGNOSIS — N1832 Chronic kidney disease, stage 3b: Secondary | ICD-10-CM | POA: Diagnosis not present

## 2020-07-26 DIAGNOSIS — I421 Obstructive hypertrophic cardiomyopathy: Secondary | ICD-10-CM | POA: Diagnosis not present

## 2020-07-26 DIAGNOSIS — J449 Chronic obstructive pulmonary disease, unspecified: Secondary | ICD-10-CM | POA: Diagnosis not present

## 2020-07-26 DIAGNOSIS — I350 Nonrheumatic aortic (valve) stenosis: Secondary | ICD-10-CM | POA: Diagnosis not present

## 2020-07-26 DIAGNOSIS — I130 Hypertensive heart and chronic kidney disease with heart failure and stage 1 through stage 4 chronic kidney disease, or unspecified chronic kidney disease: Secondary | ICD-10-CM | POA: Diagnosis not present

## 2020-07-26 DIAGNOSIS — D631 Anemia in chronic kidney disease: Secondary | ICD-10-CM | POA: Diagnosis not present

## 2020-07-26 DIAGNOSIS — I69398 Other sequelae of cerebral infarction: Secondary | ICD-10-CM | POA: Diagnosis not present

## 2020-07-26 DIAGNOSIS — I5032 Chronic diastolic (congestive) heart failure: Secondary | ICD-10-CM | POA: Diagnosis not present

## 2020-07-26 DIAGNOSIS — R200 Anesthesia of skin: Secondary | ICD-10-CM | POA: Diagnosis not present

## 2020-07-26 NOTE — Telephone Encounter (Signed)
Gave verbal order to [facility] [phone] for skilled nursing 1 week 5 weeks and also advised that we discuss with pt   Dr. Humphrey Rolls had wanted her double her Lexapro to help with smoking cessation, but she hadnt done that so advised her to do that yesterday

## 2020-08-02 DIAGNOSIS — H34231 Retinal artery branch occlusion, right eye: Secondary | ICD-10-CM | POA: Diagnosis not present

## 2020-08-03 DIAGNOSIS — H5213 Myopia, bilateral: Secondary | ICD-10-CM | POA: Diagnosis not present

## 2020-08-04 DIAGNOSIS — I5032 Chronic diastolic (congestive) heart failure: Secondary | ICD-10-CM | POA: Diagnosis not present

## 2020-08-04 DIAGNOSIS — I2510 Atherosclerotic heart disease of native coronary artery without angina pectoris: Secondary | ICD-10-CM | POA: Diagnosis not present

## 2020-08-04 DIAGNOSIS — I421 Obstructive hypertrophic cardiomyopathy: Secondary | ICD-10-CM | POA: Diagnosis not present

## 2020-08-04 DIAGNOSIS — I130 Hypertensive heart and chronic kidney disease with heart failure and stage 1 through stage 4 chronic kidney disease, or unspecified chronic kidney disease: Secondary | ICD-10-CM | POA: Diagnosis not present

## 2020-08-04 DIAGNOSIS — J449 Chronic obstructive pulmonary disease, unspecified: Secondary | ICD-10-CM | POA: Diagnosis not present

## 2020-08-04 DIAGNOSIS — I69398 Other sequelae of cerebral infarction: Secondary | ICD-10-CM | POA: Diagnosis not present

## 2020-08-04 DIAGNOSIS — R200 Anesthesia of skin: Secondary | ICD-10-CM | POA: Diagnosis not present

## 2020-08-04 DIAGNOSIS — N1832 Chronic kidney disease, stage 3b: Secondary | ICD-10-CM | POA: Diagnosis not present

## 2020-08-04 DIAGNOSIS — H5461 Unqualified visual loss, right eye, normal vision left eye: Secondary | ICD-10-CM | POA: Diagnosis not present

## 2020-08-04 DIAGNOSIS — D631 Anemia in chronic kidney disease: Secondary | ICD-10-CM | POA: Diagnosis not present

## 2020-08-04 DIAGNOSIS — I350 Nonrheumatic aortic (valve) stenosis: Secondary | ICD-10-CM | POA: Diagnosis not present

## 2020-08-08 ENCOUNTER — Other Ambulatory Visit: Payer: Self-pay | Admitting: Nurse Practitioner

## 2020-08-12 DIAGNOSIS — H5461 Unqualified visual loss, right eye, normal vision left eye: Secondary | ICD-10-CM | POA: Diagnosis not present

## 2020-08-12 DIAGNOSIS — I130 Hypertensive heart and chronic kidney disease with heart failure and stage 1 through stage 4 chronic kidney disease, or unspecified chronic kidney disease: Secondary | ICD-10-CM | POA: Diagnosis not present

## 2020-08-12 DIAGNOSIS — I69398 Other sequelae of cerebral infarction: Secondary | ICD-10-CM | POA: Diagnosis not present

## 2020-08-12 DIAGNOSIS — J449 Chronic obstructive pulmonary disease, unspecified: Secondary | ICD-10-CM | POA: Diagnosis not present

## 2020-08-12 DIAGNOSIS — I421 Obstructive hypertrophic cardiomyopathy: Secondary | ICD-10-CM | POA: Diagnosis not present

## 2020-08-12 DIAGNOSIS — D631 Anemia in chronic kidney disease: Secondary | ICD-10-CM | POA: Diagnosis not present

## 2020-08-12 DIAGNOSIS — R200 Anesthesia of skin: Secondary | ICD-10-CM | POA: Diagnosis not present

## 2020-08-12 DIAGNOSIS — I2510 Atherosclerotic heart disease of native coronary artery without angina pectoris: Secondary | ICD-10-CM | POA: Diagnosis not present

## 2020-08-12 DIAGNOSIS — N1832 Chronic kidney disease, stage 3b: Secondary | ICD-10-CM | POA: Diagnosis not present

## 2020-08-12 DIAGNOSIS — I5032 Chronic diastolic (congestive) heart failure: Secondary | ICD-10-CM | POA: Diagnosis not present

## 2020-08-12 DIAGNOSIS — I350 Nonrheumatic aortic (valve) stenosis: Secondary | ICD-10-CM | POA: Diagnosis not present

## 2020-08-17 ENCOUNTER — Other Ambulatory Visit: Payer: Self-pay

## 2020-08-17 ENCOUNTER — Ambulatory Visit (INDEPENDENT_AMBULATORY_CARE_PROVIDER_SITE_OTHER): Payer: Medicare Other | Admitting: Cardiology

## 2020-08-17 ENCOUNTER — Encounter: Payer: Self-pay | Admitting: Cardiology

## 2020-08-17 VITALS — BP 136/76 | HR 56 | Ht 63.0 in | Wt 171.4 lb

## 2020-08-17 DIAGNOSIS — I639 Cerebral infarction, unspecified: Secondary | ICD-10-CM | POA: Diagnosis not present

## 2020-08-17 NOTE — Progress Notes (Signed)
Electrophysiology Office Follow up Visit Note:    Date:  08/17/2020   ID:  Sarah Barnes, DOB 02-Oct-1950, MRN [id number]  PCP:  [name], MD  [place] Cardiologist:  No primary care provider on file.  [facility] Electrophysiologist:  Vickie Epley, MD    Interval History:    Sarah Barnes is a 70 y.o. female who presents for a follow up visit.  I last saw her July 20, 2020 for cryptogenic stroke.  At that appointment, loop recorder was recommended and the patient returns today to have this monitor implanted.    Past Medical History:  Diagnosis Date  . Agoraphobia with panic disorder   . Anemia   . Arthritis    "all over" (08/13/2016)  . Chronic bronchitis ([place])   . COPD (chronic obstructive pulmonary disease) ([place])   . Depression   . Dyspnea    occ  . Dysrhythmia    palpitations occ due to anxiety  . ETOH abuse   . GAD (generalized anxiety disorder)   . GERD (gastroesophageal reflux disease)   . Headache    "daily til I got glasses; now have headache once in awhile" (08/13/2016)  . Hepatitis 1960   "isolated &  hospitalized for 2 weeks; don't know which kind of hepatitis"   . Hernia, hiatal   . High cholesterol   . Hypertension   . Hypertrophic obstructive cardiomyopathy ([place])   . Mild aortic stenosis   . Pneumonia    "once or twice" (08/13/2016)  . PONV (postoperative nausea and vomiting)   . Sciatica   . Stroke [facility])     Past Surgical History:  Procedure Laterality Date  . ANAL FISTULECTOMY  1970s X 3  . BACK SURGERY    . HERNIA REPAIR    . KNEE ARTHROSCOPY Right   . LAPAROSCOPIC CHOLECYSTECTOMY    . LAPAROSCOPIC INCISIONAL / UMBILICAL / VENTRAL HERNIA REPAIR  08/13/2016   VHR w/mesh  . LUMBAR DISC SURGERY     "herniated disc"  . NASAL FRACTURE SURGERY  1970s X 2  . RIGHT/LEFT HEART CATH AND CORONARY ANGIOGRAPHY N/A 12/27/2016   Procedure: RIGHT/LEFT HEART CATH AND CORONARY ANGIOGRAPHY;  Surgeon: Leonie Man, MD;  Location: [place]  INVASIVE CV LAB: Mild-moderate pulmonary hypertension.  Hyperdynamic ventricle noted.  EF 55-65% -hyperdynamic (unable to measure LVOT gradient).  Mildly elevated very tortuous but angiographically normal coronary arteries, suggesting hypertensive heart disease  . SHOULDER ARTHROSCOPY WITH ROTATOR CUFF REPAIR Right 1990s?  . TONSILLECTOMY  1951  . TRANSTHORACIC ECHOCARDIOGRAM  12/2016    EF 60-65% with dynamic outflow tract obstruction at rest. Peak gradient 52 mmHg consistent with HOCM.  GRII DD area. Aortic sclerosis but no stenosis. Systolic anterior motion of mitral valve chordae. Mild mitral stenosis. Moderate LA dilation and mild RA dilation. Peak PA pressures 35 mmHg.  Marland Kitchen VENTRAL HERNIA REPAIR N/A 08/13/2016   Procedure: LAPAROSCOPIC VENTRAL HERNIA REPAIR WITH MESH;  Surgeon: Judeth Horn, MD;  Location: [place];  Service: General;  Laterality: N/A;    Current Medications: No outpatient medications have been marked as taking for the 08/17/20 encounter (Appointment) with Vickie Epley, MD.     Allergies:   Patient has no known allergies.   Social History   Socioeconomic History  . Marital status: Single    Spouse name: Not on file  . Number of children: Not on file  . Years of education: Not on file  . Highest education level: Not on  file  Occupational History  . Not on file  Tobacco Use  . Smoking status: Current Every Day Smoker    Packs/day: 1.50    Years: 49.00    Pack years: 73.50    Types: Cigarettes  . Smokeless tobacco: Never Used  Substance and Sexual Activity  . Alcohol use: Not Currently    Alcohol/week: 84.0 standard drinks    Types: 36 Cans of beer, 48 Shots of liquor per week    Comment: occ  . Drug use: Not Currently    Frequency: 1.0 times per week    Types: Marijuana, Cocaine    Comment: Cocaine + March 2017  . Sexual activity: Not Currently  Other Topics Concern  . Not on file  Social History Narrative   [name] has generalized anxiety disorder and  clearcut Agoraphobia.   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    She currently lives with her sister.   Currently denies substance abuse beyond Tobacco (1-1/2 PPD) & EtOH (beer, vodka) - not ready to discuss quitting.   Social Determinants of Health   Financial Resource Strain: Not on file  Food Insecurity: Not on file  Transportation Needs: Not on file  Physical Activity: Not on file  Stress: Not on file  Social Connections: Not on file     Family History: The patient's family history includes Angina in her mother; Cancer in her brother; Diabetes in her mother; Heart attack (age of onset: 75) in her mother; Hyperlipidemia in her mother; Hypertension in her father and mother; Stroke in her father.  ROS:   Please see the history of present illness.    All other systems reviewed and are negative.  EKGs/Labs/Other Studies Reviewed:    The following studies were reviewed today:   Recent Labs: 11/20/2019: TSH 1.470 07/04/2020: ALT 14; B Natriuretic Peptide 137.4 07/06/2020: BUN 27; Creatinine, Ser 1.23; Hemoglobin 13.6; Magnesium 2.2; Platelets 272; Potassium 3.9; Sodium 136  Recent Lipid Panel    Component Value Date/Time   CHOL 174 07/05/2020 0545   CHOL 204 (H) 11/20/2019 1317   TRIG 237 (H) 07/05/2020 0545   HDL 37 (L) 07/05/2020 0545   HDL 47 11/20/2019 1317   CHOLHDL 4.7 07/05/2020 0545   VLDL 47 (H) 07/05/2020 0545   LDLCALC 90 07/05/2020 0545   LDLCALC 123 (H) 11/20/2019 1317   LDLDIRECT 66.0 10/20/2015 0841    Physical Exam:    VS:  There were no vitals taken for this visit.    Wt Readings from Last 3 Encounters:  07/25/20 170 lb (77.1 kg)  07/20/20 168 lb (76.2 kg)  07/12/20 168 lb 12.8 oz (76.6 kg)     GEN:  Well nourished, well developed in no acute distress HEENT: Normal NECK: No JVD; No carotid bruits LYMPHATICS: No lymphadenopathy CARDIAC: RRR, no  murmurs, rubs, gallops RESPIRATORY:  Clear to auscultation without rales, wheezing or rhonchi  ABDOMEN: Soft, non-tender, non-distended MUSCULOSKELETAL:  No edema; No deformity  SKIN: Warm and dry NEUROLOGIC:  Alert and oriented x 3 PSYCHIATRIC:  Normal affect   ASSESSMENT:    No diagnosis found. PLAN:    In order of problems listed above:  1. Cryptogenic stroke No evidence of atrial fibrillation on prior EKGs.  Plan to proceed with loop recorder implantation for ongoing surveillance for atrial fibrillation.  I discussed the procedure in detail again during today's  visit including the risks and recovery time and she wishes to proceed.  Medication Adjustments/Labs and Tests Ordered: Current medicines are reviewed at length with the patient today.  Concerns regarding medicines are outlined above.  No orders of the defined types were placed in this encounter.  No orders of the defined types were placed in this encounter.    Signed, Sarah Mage, MD, [facility]  08/17/2020 1:02 PM    Electrophysiology [facility]   ------------------------------------------------------------------------------------ SURGEON:  Sarah Mage, MD    PREPROCEDURE DIAGNOSIS:  Cryptogenic stroke    POSTPROCEDURE DIAGNOSIS:  Cryptogenic stroke     PROCEDURES:   1. Implantable loop recorder implantation    INTRODUCTION:  Sarah Barnes is a 70 y.o. patient with a history of cryptogenic stroke. Inpatient telemetry has been reviewed and not shown atrial fibrillation. The patient therefore presents today for implantable loop implantation.     DESCRIPTION OF PROCEDURE:  Informed written consent was obtained.  The patient required no sedation for the procedure today.  Mapping over the patient's chest was performed to identify the area where electrograms were most prominent for ILR recording.  This area was found to be the left parasternal region over the 4th intercostal space. The  patients left chest was therefore prepped and draped in the usual sterile fashion. The skin overlying the left parasternal region was infiltrated with lidocaine for local analgesia.  A 0.5-cm incision was made over the left parasternal region over the 3rd intercostal space.  A subcutaneous ILR pocket was fashioned using a combination of sharp and blunt dissection.  A Medtronic Reveal Linq model [id number]) implantable loop recorder was then placed into the pocket  R waves were very prominent and measured >0.67mV.  Steri- Strips and a sterile dressing were then applied.  There were no early apparent complications.     CONCLUSIONS:   1. Successful implantation of a Medtronic Reveal LINQ implantable loop recorder for a history of cryptogenic stroke  2. No early apparent complications.   Sarah Mage, MD 07/18/2020 3:51 PM

## 2020-08-17 NOTE — Patient Instructions (Addendum)
Medication Instructions:  Your physician recommends that you continue on your current medications as directed. Please refer to the Current Medication list given to you today.  Labwork: None ordered.  Testing/Procedures: None ordered.  Follow-Up:  Your physician wants you to follow-up in: as needed with Dr. Quentin Ore.      Implantable Loop Recorder Placement, Care After This sheet gives you information about how to care for yourself after your procedure. Your health care provider may also give you more specific instructions. If you have problems or questions, contact your health care provider. What can I expect after the procedure? After the procedure, it is common to have:  Soreness or discomfort near the incision.  Some swelling or bruising near the incision.  Follow these instructions at home: Incision care  1.  Leave your outer dressing on for 72 hours.  After 72 hours you can remove your outer dressing and shower. 2. Leave adhesive strips in place. These skin closures may need to stay in place for 1-2 weeks. If adhesive strip edges start to loosen and curl up, you may trim the loose edges.  You may remove the strips if they have not fallen off after 2 weeks. 3. Check your incision area every day for signs of infection. Check for: a. Redness, swelling, or pain. b. Fluid or blood. c. Warmth. d. Pus or a bad smell. 4. Do not take baths, swim, or use a hot tub until your incision is completely healed. 5. If your wound site starts to bleed apply pressure.      If you have any questions/concerns please call the device clinic at [phone].  Activity  Return to your normal activities.  General instructions  Follow instructions from your health care provider about how to manage your implantable loop recorder and transmit the information. Learn how to activate a recording if this is necessary for your type of device.  Do not go through a metal detection gate, and do not let  someone hold a metal detector over your chest. Show your ID card.  Do not have an MRI unless you check with your health care provider first.  Take over-the-counter and prescription medicines only as told by your health care provider.  Keep all follow-up visits as told by your health care provider. This is important. Contact a health care provider if:  You have redness, swelling, or pain around your incision.  You have a fever.  You have pain that is not relieved by your pain medicine.  You have triggered your device because of fainting (syncope) or because of a heartbeat that feels like it is racing, slow, fluttering, or skipping (palpitations). Get help right away if you have:  Chest pain.  Difficulty breathing. Summary  After the procedure, it is common to have soreness or discomfort near the incision.  Change your dressing as told by your health care provider.  Follow instructions from your health care provider about how to manage your implantable loop recorder and transmit the information.  Keep all follow-up visits as told by your health care provider. This is important. This information is not intended to replace advice given to you by your health care provider. Make sure you discuss any questions you have with your health care provider. Document Released: 04/18/2015 Document Revised: 06/22/2017 Document Reviewed: 06/22/2017 Elsevier Patient Education  2020 [employer].

## 2020-08-23 DIAGNOSIS — I5032 Chronic diastolic (congestive) heart failure: Secondary | ICD-10-CM | POA: Diagnosis not present

## 2020-08-23 DIAGNOSIS — I69398 Other sequelae of cerebral infarction: Secondary | ICD-10-CM | POA: Diagnosis not present

## 2020-08-23 DIAGNOSIS — I130 Hypertensive heart and chronic kidney disease with heart failure and stage 1 through stage 4 chronic kidney disease, or unspecified chronic kidney disease: Secondary | ICD-10-CM | POA: Diagnosis not present

## 2020-08-23 DIAGNOSIS — I350 Nonrheumatic aortic (valve) stenosis: Secondary | ICD-10-CM | POA: Diagnosis not present

## 2020-08-23 DIAGNOSIS — J449 Chronic obstructive pulmonary disease, unspecified: Secondary | ICD-10-CM | POA: Diagnosis not present

## 2020-08-23 DIAGNOSIS — I2510 Atherosclerotic heart disease of native coronary artery without angina pectoris: Secondary | ICD-10-CM | POA: Diagnosis not present

## 2020-08-23 DIAGNOSIS — N1832 Chronic kidney disease, stage 3b: Secondary | ICD-10-CM | POA: Diagnosis not present

## 2020-08-23 DIAGNOSIS — R200 Anesthesia of skin: Secondary | ICD-10-CM | POA: Diagnosis not present

## 2020-08-23 DIAGNOSIS — I421 Obstructive hypertrophic cardiomyopathy: Secondary | ICD-10-CM | POA: Diagnosis not present

## 2020-08-23 DIAGNOSIS — D631 Anemia in chronic kidney disease: Secondary | ICD-10-CM | POA: Diagnosis not present

## 2020-08-23 DIAGNOSIS — H5461 Unqualified visual loss, right eye, normal vision left eye: Secondary | ICD-10-CM | POA: Diagnosis not present

## 2020-08-25 ENCOUNTER — Other Ambulatory Visit: Payer: Self-pay | Admitting: Internal Medicine

## 2020-08-30 ENCOUNTER — Other Ambulatory Visit: Payer: Self-pay | Admitting: Internal Medicine

## 2020-08-30 DIAGNOSIS — M79605 Pain in left leg: Secondary | ICD-10-CM

## 2020-08-30 DIAGNOSIS — M79604 Pain in right leg: Secondary | ICD-10-CM

## 2020-08-30 NOTE — Progress Notes (Signed)
Arterial study

## 2020-08-31 ENCOUNTER — Ambulatory Visit: Payer: Medicaid Other | Admitting: Hospice and Palliative Medicine

## 2020-08-31 ENCOUNTER — Ambulatory Visit (INDEPENDENT_AMBULATORY_CARE_PROVIDER_SITE_OTHER): Payer: Medicare Other

## 2020-08-31 ENCOUNTER — Other Ambulatory Visit: Payer: Self-pay

## 2020-08-31 DIAGNOSIS — M79605 Pain in left leg: Secondary | ICD-10-CM

## 2020-08-31 DIAGNOSIS — M79604 Pain in right leg: Secondary | ICD-10-CM | POA: Diagnosis not present

## 2020-09-01 DIAGNOSIS — R200 Anesthesia of skin: Secondary | ICD-10-CM | POA: Diagnosis not present

## 2020-09-01 DIAGNOSIS — J449 Chronic obstructive pulmonary disease, unspecified: Secondary | ICD-10-CM | POA: Diagnosis not present

## 2020-09-01 DIAGNOSIS — I130 Hypertensive heart and chronic kidney disease with heart failure and stage 1 through stage 4 chronic kidney disease, or unspecified chronic kidney disease: Secondary | ICD-10-CM | POA: Diagnosis not present

## 2020-09-01 DIAGNOSIS — H5461 Unqualified visual loss, right eye, normal vision left eye: Secondary | ICD-10-CM | POA: Diagnosis not present

## 2020-09-01 DIAGNOSIS — D631 Anemia in chronic kidney disease: Secondary | ICD-10-CM | POA: Diagnosis not present

## 2020-09-01 DIAGNOSIS — I421 Obstructive hypertrophic cardiomyopathy: Secondary | ICD-10-CM | POA: Diagnosis not present

## 2020-09-01 DIAGNOSIS — N1832 Chronic kidney disease, stage 3b: Secondary | ICD-10-CM | POA: Diagnosis not present

## 2020-09-01 DIAGNOSIS — I5032 Chronic diastolic (congestive) heart failure: Secondary | ICD-10-CM | POA: Diagnosis not present

## 2020-09-01 DIAGNOSIS — I69398 Other sequelae of cerebral infarction: Secondary | ICD-10-CM | POA: Diagnosis not present

## 2020-09-01 DIAGNOSIS — I350 Nonrheumatic aortic (valve) stenosis: Secondary | ICD-10-CM | POA: Diagnosis not present

## 2020-09-01 DIAGNOSIS — I2510 Atherosclerotic heart disease of native coronary artery without angina pectoris: Secondary | ICD-10-CM | POA: Diagnosis not present

## 2020-09-05 ENCOUNTER — Other Ambulatory Visit: Payer: Self-pay

## 2020-09-05 ENCOUNTER — Encounter: Payer: Self-pay | Admitting: Physician Assistant

## 2020-09-05 ENCOUNTER — Ambulatory Visit (INDEPENDENT_AMBULATORY_CARE_PROVIDER_SITE_OTHER): Payer: Medicare Other | Admitting: Physician Assistant

## 2020-09-05 DIAGNOSIS — I739 Peripheral vascular disease, unspecified: Secondary | ICD-10-CM

## 2020-09-05 DIAGNOSIS — M79605 Pain in left leg: Secondary | ICD-10-CM

## 2020-09-05 DIAGNOSIS — I70201 Unspecified atherosclerosis of native arteries of extremities, right leg: Secondary | ICD-10-CM

## 2020-09-05 DIAGNOSIS — F17218 Nicotine dependence, cigarettes, with other nicotine-induced disorders: Secondary | ICD-10-CM

## 2020-09-05 DIAGNOSIS — F411 Generalized anxiety disorder: Secondary | ICD-10-CM

## 2020-09-05 DIAGNOSIS — M79604 Pain in right leg: Secondary | ICD-10-CM

## 2020-09-05 DIAGNOSIS — I6389 Other cerebral infarction: Secondary | ICD-10-CM

## 2020-09-05 MED ORDER — CILOSTAZOL 50 MG PO TABS: 50.0000 mg | ORAL_TABLET | Freq: Two times a day (BID) | ORAL | 2 refills | Status: DC

## 2020-09-05 MED ORDER — BUPROPION HCL ER (XL) 150 MG PO TB24: 150.0000 mg | ORAL_TABLET | Freq: Every day | ORAL | 0 refills | Status: DC

## 2020-09-05 NOTE — Progress Notes (Signed)
[facility] [place], [place] [address]  Internal MEDICINE  Office Visit Note  Patient Name: Sarah Barnes  [id number]  [id number]  Date of Service: 09/06/2020  Chief Complaint  Patient presents with  . Follow-up    Review [place], pt wants to quit smoking, sleepy all the time  . Depression  . Gastroesophageal Reflux  . Hyperlipidemia  . Hypertension  . Anemia  . Anxiety  . COPD    HPI Pt is here for f/u and [place] results. -She is tired but does lots of yard work, takes care of the house, and picks up after 2 men and pets. Goes to sleep by 9pm and wakes up at 5:30. -Never increased the lexapro and has been staying at 10mg . Really wants something to help with smoking. Had previously discussed increasing the lexapro to help as she decreased smoking. Will start wellbutrin at this time. -Gets pain in both legs all the time especially in groin and behind knees. Takes ASA and lipitor. Never started on plavix or anticoagulant after stroke. -Had loop recorder placed 3 weeks ago due to cryptogenic stroke. No plan for f/u unless they find something and contact her.  US arterial lower extremity duplex on 08/31/20: -Segmental stenosis of mid right superficial femoral artery with elevated flow velocities. Moderately abnormal (monophasic) waveforms of the left proximal popliteal, distal popliteal and posterior tibial arteries. The monophasic waveforms are consistent with a diffuse small vessel disease development in the left lower extremity.  Current Medication: Outpatient Encounter Medications as of 09/05/2020  Medication Sig  . buPROPion (WELLBUTRIN XL) 150 MG 24 hr tablet Take 1 tablet (150 mg total) by mouth daily.  . cilostazol (PLETAL) 50 MG tablet Take 1 tablet (50 mg total) by mouth 2 (two) times daily.  Marland Kitchen aspirin EC 81 MG EC tablet Take 1 tablet (81 mg total) by mouth daily.  Marland Kitchen atorvastatin (LIPITOR) 40 MG tablet Take 1 tablet (40 mg total) by mouth daily.  .  busPIRone (BUSPAR) 15 MG tablet Take 1 tablet (15 mg total) by mouth 2 (two) times daily.  . carvedilol (COREG) 12.5 MG tablet Take by mouth 2 (two) times daily.  . celecoxib (CELEBREX) 200 MG capsule Take 1 capsule (200 mg total) by mouth daily.  Marland Kitchen escitalopram (LEXAPRO) 10 MG tablet Take 1 tablet (10 mg total) by mouth daily.  . fenofibrate micronized (LOFIBRA) 134 MG capsule Take 1 capsule (134 mg total) by mouth daily before breakfast. NEEDS APPOINTMENT FOR FUTURE REFILLS  . folic acid (FOLVITE) 1 MG tablet Take 1 tablet (1 mg total) by mouth daily.  . furosemide (LASIX) 40 MG tablet Take 1 tablet (40 mg total) by mouth daily.  Marland Kitchen gabapentin (NEURONTIN) 100 MG capsule Take 100 mg by mouth 3 (three) times daily. 2 in the morning, 1 in the afternoon and 2 at night.  . meclizine (ANTIVERT) 25 MG tablet Take 1 tablet (25 mg total) by mouth 3 (three) times daily as needed for dizziness.  . Multiple Vitamin (MULTIVITAMIN WITH MINERALS) TABS tablet Take 1 tablet by mouth daily.  . nicotine (NICODERM CQ - DOSED IN MG/24 HOURS) 21 mg/24hr patch Place 1 patch (21 mg total) onto the skin daily.  . pantoprazole (PROTONIX) 40 MG tablet Take 1 tablet (40 mg total) by mouth daily. NEEDS APPOINTMENT FOR FUTURE REFILLS  . tiZANidine (ZANAFLEX) 2 MG tablet Take 1 tablet (2 mg total) by mouth every 8 (eight) hours as needed for muscle spasms.  . traZODone (  DESYREL) 100 MG tablet TAKE 1 TABLET BY MOUTH  DAILY AT BEDTIME  . valsartan (DIOVAN) 160 MG tablet Take 160 mg by mouth daily.   No facility-administered encounter medications on file as of 09/05/2020.    Surgical History: Past Surgical History:  Procedure Laterality Date  . ANAL FISTULECTOMY  1970s X 3  . BACK SURGERY    . HERNIA REPAIR    . KNEE ARTHROSCOPY Right   . LAPAROSCOPIC CHOLECYSTECTOMY    . LAPAROSCOPIC INCISIONAL / UMBILICAL / VENTRAL HERNIA REPAIR  08/13/2016   VHR w/mesh  . LUMBAR DISC SURGERY     "herniated disc"  . NASAL FRACTURE  SURGERY  1970s X 2  . RIGHT/LEFT HEART CATH AND CORONARY ANGIOGRAPHY N/A 12/27/2016   Procedure: RIGHT/LEFT HEART CATH AND CORONARY ANGIOGRAPHY;  Surgeon: Leonie Man, MD;  Location: [facility] INVASIVE CV LAB: Mild-moderate pulmonary hypertension.  Hyperdynamic ventricle noted.  EF 55-65% -hyperdynamic (unable to measure LVOT gradient).  Mildly elevated very tortuous but angiographically normal coronary arteries, suggesting hypertensive heart disease  . SHOULDER ARTHROSCOPY WITH ROTATOR CUFF REPAIR Right 1990s?  . TONSILLECTOMY  1951  . TRANSTHORACIC ECHOCARDIOGRAM  12/2016    EF 60-65% with dynamic outflow tract obstruction at rest. Peak gradient 52 mmHg consistent with HOCM.  GRII DD area. Aortic sclerosis but no stenosis. Systolic anterior motion of mitral valve chordae. Mild mitral stenosis. Moderate LA dilation and mild RA dilation. Peak PA pressures 35 mmHg.  Marland Kitchen VENTRAL HERNIA REPAIR N/A 08/13/2016   Procedure: LAPAROSCOPIC VENTRAL HERNIA REPAIR WITH MESH;  Surgeon: Judeth Horn, MD;  Location: [facility];  Service: General;  Laterality: N/A;    Medical History: Past Medical History:  Diagnosis Date  . Agoraphobia with panic disorder   . Anemia   . Arthritis    "all over" (08/13/2016)  . Chronic bronchitis ([facility])   . COPD (chronic obstructive pulmonary disease) ([place])   . Depression   . Dyspnea    occ  . Dysrhythmia    palpitations occ due to anxiety  . ETOH abuse   . GAD (generalized anxiety disorder)   . GERD (gastroesophageal reflux disease)   . Headache    "daily til I got glasses; now have headache once in awhile" (08/13/2016)  . Hepatitis 1960   "isolated &  hospitalized for 2 weeks; don't know which kind of hepatitis"   . Hernia, hiatal   . High cholesterol   . Hypertension   . Hypertrophic obstructive cardiomyopathy ([facility])   . Mild aortic stenosis   . Pneumonia    "once or twice" (08/13/2016)  . PONV (postoperative nausea and vomiting)   . Sciatica   . Stroke [facility])     Family  History: Family History  Problem Relation Age of Onset  . Diabetes Mother   . Hyperlipidemia Mother   . Hypertension Mother   . Heart attack Mother 61  . Angina Mother        chronic problem  . Hypertension Father   . Stroke Father   . Cancer Brother     Social History   Socioeconomic History  . Marital status: Single    Spouse name: Not on file  . Number of children: Not on file  . Years of education: Not on file  . Highest education level: Not on file  Occupational History  . Not on file  Tobacco Use  . Smoking status: Current Every Day Smoker    Packs/day: 1.50    Years: 49.00  Pack years: 73.50    Types: Cigarettes  . Smokeless tobacco: Never Used  Substance and Sexual Activity  . Alcohol use: Not Currently    Alcohol/week: 84.0 standard drinks    Types: 36 Cans of beer, 48 Shots of liquor per week    Comment: occ  . Drug use: Not Currently    Frequency: 1.0 times per week    Types: Marijuana, Cocaine    Comment: Cocaine + March 2017  . Sexual activity: Not Currently  Other Topics Concern  . Not on file  Social History Narrative   Mykell has generalized anxiety disorder and clearcut Agoraphobia.   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    She currently lives with her sister.   Currently denies substance abuse beyond Tobacco (1-1/2 PPD) & EtOH (beer, vodka) - not ready to discuss quitting.   Social Determinants of Health   Financial Resource Strain: Not on file  Food Insecurity: Not on file  Transportation Needs: Not on file  Physical Activity: Not on file  Stress: Not on file  Social Connections: Not on file  Intimate Partner Violence: Not on file      Review of Systems  Constitutional: Positive for fatigue. Negative for chills and unexpected weight change.  HENT: Negative for congestion, postnasal drip, rhinorrhea, sneezing and sore throat.    Eyes: Negative for redness.  Respiratory: Negative for cough, chest tightness and shortness of breath.   Cardiovascular: Negative for chest pain, palpitations and leg swelling.  Gastrointestinal: Negative for abdominal pain, constipation, diarrhea, nausea and vomiting.  Genitourinary: Negative for dysuria and frequency.  Musculoskeletal: Positive for arthralgias and myalgias. Negative for back pain, joint swelling and neck pain.  Skin: Negative for rash.  Neurological: Negative.  Negative for tremors and numbness.  Hematological: Negative for adenopathy. Does not bruise/bleed easily.  Psychiatric/Behavioral: Negative for behavioral problems (Depression), sleep disturbance and suicidal ideas. The patient is not nervous/anxious.     Vital Signs: BP 128/68   Pulse 64   Temp (!) 97.4 F (36.3 C)   Resp 16   Ht 5' 3.5" (1.613 m)   Wt 169 lb 6.4 oz (76.8 kg)   SpO2 98%   BMI 29.54 kg/m    Physical Exam Vitals and nursing note reviewed.  Constitutional:      General: She is not in acute distress.    Appearance: She is well-developed. She is not diaphoretic.  HENT:     Head: Normocephalic and atraumatic.     Mouth/Throat:     Pharynx: No oropharyngeal exudate.  Eyes:     Pupils: Pupils are equal, round, and reactive to light.  Neck:     Thyroid: No thyromegaly.     Vascular: No JVD.     Trachea: No tracheal deviation.  Cardiovascular:     Rate and Rhythm: Normal rate and regular rhythm.     Heart sounds: Normal heart sounds. No murmur heard. No friction rub. No gallop.   Pulmonary:     Effort: Pulmonary effort is normal. No respiratory distress.     Breath sounds: No wheezing or rales.  Chest:     Chest wall: No tenderness.  Abdominal:     General: Bowel sounds are normal.     Palpations: Abdomen is soft.  Musculoskeletal:        General: Normal range of motion.  Cervical back: Normal range of motion and neck supple.     Right lower leg: No edema.     Left lower  leg: No edema.     Comments: Pain in legs appears constant and is especially bad at night  Lymphadenopathy:     Cervical: No cervical adenopathy.  Skin:    General: Skin is warm and dry.  Neurological:     Mental Status: She is alert and oriented to person, place, and time.     Cranial Nerves: No cranial nerve deficit.  Psychiatric:        Behavior: Behavior normal.        Thought Content: Thought content normal.        Judgment: Judgment normal.        Assessment/Plan: 1. Cigarette nicotine dependence with other nicotine-induced disorder Will start wellbutrin for smoking cessation and anxiety. - buPROPion (WELLBUTRIN XL) 150 MG 24 hr tablet; Take 1 tablet (150 mg total) by mouth daily.  Dispense: 90 tablet; Refill: 0  2. Bilateral leg pain Will start pletal based on arterial US results and refer to vascular surgery for further management - Ambulatory referral to Vascular Surgery - cilostazol (PLETAL) 50 MG tablet; Take 1 tablet (50 mg total) by mouth 2 (two) times daily.  Dispense: 90 tablet; Refill: 2  3. Stenosis of artery of right lower extremity (HCC) Will start pletal based on arterial US results and refer to vascular surgery for further management. Continue ASA and lipitor. - Ambulatory referral to Vascular Surgery - cilostazol (PLETAL) 50 MG tablet; Take 1 tablet (50 mg total) by mouth 2 (two) times daily.  Dispense: 90 tablet; Refill: 2  4. Small vessel disease ([place]) Will start pletal based on arterial US results and refer to vascular surgery for further management. Continue ASA and lipitor. - Ambulatory referral to Vascular Surgery - cilostazol (PLETAL) 50 MG tablet; Take 1 tablet (50 mg total) by mouth 2 (two) times daily.  Dispense: 90 tablet; Refill: 2  5. Cerebrovascular accident (CVA) due to other mechanism [facility]) Due to cryptogenic stroke, cardiology placed a loop recorder a few weeks ago for monitoring. Continue ASA and lipitor  6. GAD (generalized anxiety  disorder) Continue lexapro 10mg  and add wellbutrin   General Counseling: [place] verbalizes understanding of the findings of todays visit and agrees with plan of treatment. I have discussed any further diagnostic evaluation that may be needed or ordered today. We also reviewed her medications today. she has been encouraged to call the office with any questions or concerns that should arise related to todays visit.  Smoking cessation counseling: 1. Pt acknowledges the risks of long term smoking, she will try to quite smoking. 2. Options for different medications including nicotine products, chewing gum, patch etc, Wellbutrin and Chantix is discussed 3. Goal and date of compete cessation is discussed 4. Total time spent in smoking cessation is 10 min.   Orders Placed This Encounter  Procedures  . Ambulatory referral to Vascular Surgery    Meds ordered this encounter  Medications  . buPROPion (WELLBUTRIN XL) 150 MG 24 hr tablet    Sig: Take 1 tablet (150 mg total) by mouth daily.    Dispense:  90 tablet    Refill:  0  . cilostazol (PLETAL) 50 MG tablet    Sig: Take 1 tablet (50 mg total) by mouth 2 (two) times daily.    Dispense:  90 tablet    Refill:  2    This patient was  seen by Drema Dallas, PA-C in collaboration with Dr. Clayborn Bigness as a part of collaborative care agreement.   Total time spent:30 Minutes Time spent includes review of chart, medications, test results, and follow up plan with the patient.      Dr [name] Internal medicine

## 2020-09-06 ENCOUNTER — Other Ambulatory Visit: Payer: Self-pay | Admitting: Internal Medicine

## 2020-09-06 ENCOUNTER — Telehealth: Payer: Self-pay

## 2020-09-06 ENCOUNTER — Other Ambulatory Visit: Payer: Self-pay | Admitting: Nurse Practitioner

## 2020-09-06 ENCOUNTER — Other Ambulatory Visit: Payer: Self-pay | Admitting: Physician Assistant

## 2020-09-06 DIAGNOSIS — M064 Inflammatory polyarthropathy: Secondary | ICD-10-CM

## 2020-09-06 DIAGNOSIS — F411 Generalized anxiety disorder: Secondary | ICD-10-CM

## 2020-09-06 DIAGNOSIS — F17218 Nicotine dependence, cigarettes, with other nicotine-induced disorders: Secondary | ICD-10-CM

## 2020-09-07 NOTE — Telephone Encounter (Signed)
Spoke to pt and informed her that Lauren only discussed the Wellbutrin for smoking cessation and not the patches.  Advised pt to try the Wellbutrin first and if not satisfied with it to discuss at next appt.

## 2020-09-07 NOTE — Telephone Encounter (Signed)
We did not discuss nicotine patches at all. We dicussed starting wellbutrin and I sent her a script for wellbutrin for her smoking cessation.

## 2020-09-13 ENCOUNTER — Other Ambulatory Visit: Payer: Self-pay

## 2020-09-13 ENCOUNTER — Encounter (INDEPENDENT_AMBULATORY_CARE_PROVIDER_SITE_OTHER): Payer: Self-pay | Admitting: Vascular Surgery

## 2020-09-13 ENCOUNTER — Ambulatory Visit (INDEPENDENT_AMBULATORY_CARE_PROVIDER_SITE_OTHER): Payer: Medicare Other | Admitting: Vascular Surgery

## 2020-09-13 VITALS — BP 116/64 | HR 67 | Resp 16 | Ht 63.5 in | Wt 168.6 lb

## 2020-09-13 DIAGNOSIS — I70213 Atherosclerosis of native arteries of extremities with intermittent claudication, bilateral legs: Secondary | ICD-10-CM | POA: Diagnosis not present

## 2020-09-13 DIAGNOSIS — E785 Hyperlipidemia, unspecified: Secondary | ICD-10-CM | POA: Diagnosis not present

## 2020-09-13 DIAGNOSIS — I10 Essential (primary) hypertension: Secondary | ICD-10-CM | POA: Diagnosis not present

## 2020-09-13 DIAGNOSIS — I70219 Atherosclerosis of native arteries of extremities with intermittent claudication, unspecified extremity: Secondary | ICD-10-CM | POA: Insufficient documentation

## 2020-09-13 NOTE — Progress Notes (Signed)
Patient ID: Eulis Foster, female   DOB: Oct 23, 1950, 70 y.o.   MRN: [id number]  Chief Complaint  Patient presents with  . New Patient (Initial Visit)    Ref [place] stenosis rle,ble pain    HPI IYANNA BAKARE is a 70 y.o. female.  I am asked to see the patient by Dr. Humphrey Rolls for evaluation of peripheral arterial disease seen on recent noninvasive studies.  The patient describes significant leg pain in both lower extremities.  Her legs tire easily.  She has both pain in her left groin radiating down into her thigh as well as weakness and pain in her legs with activity.  No open wounds or infection.  No fevers or chills.  Patient reports the symptoms have been steadily progressing over several years.  She was told by Dr Several years ago that she had blockage in her left thigh and leg arteries but they did not do anything about it and her symptoms have just continue to progress.  I have independently reviewed the noninvasive studies performed at her primary care physician's office.  She does have elevated velocities in the right SFA consistent with a greater than 50% stenosis.  She also has an abrupt change from triphasic to monophasic flow with low velocities in the left popliteal artery that is likely indicative of the popliteal artery occlusion or high-grade stenosis.  Given these findings, she is referred for further evaluation and treatment.     Past Medical History:  Diagnosis Date  . Agoraphobia with panic disorder   . Anemia   . Arthritis    "all over" (08/13/2016)  . Chronic bronchitis ([place])   . COPD (chronic obstructive pulmonary disease) ([place])   . Depression   . Dyspnea    occ  . Dysrhythmia    palpitations occ due to anxiety  . ETOH abuse   . GAD (generalized anxiety disorder)   . GERD (gastroesophageal reflux disease)   . Headache    "daily til I got glasses; now have headache once in awhile" (08/13/2016)  . Hepatitis 1960   "isolated &  hospitalized for 2 weeks;  don't know which kind of hepatitis"   . Hernia, hiatal   . High cholesterol   . Hypertension   . Hypertrophic obstructive cardiomyopathy ([place])   . Mild aortic stenosis   . Pneumonia    "once or twice" (08/13/2016)  . PONV (postoperative nausea and vomiting)   . Sciatica   . Stroke [facility])     Past Surgical History:  Procedure Laterality Date  . ANAL FISTULECTOMY  1970s X 3  . BACK SURGERY    . HERNIA REPAIR    . KNEE ARTHROSCOPY Right   . LAPAROSCOPIC CHOLECYSTECTOMY    . LAPAROSCOPIC INCISIONAL / UMBILICAL / VENTRAL HERNIA REPAIR  08/13/2016   VHR w/mesh  . LUMBAR DISC SURGERY     "herniated disc"  . NASAL FRACTURE SURGERY  1970s X 2  . RIGHT/LEFT HEART CATH AND CORONARY ANGIOGRAPHY N/A 12/27/2016   Procedure: RIGHT/LEFT HEART CATH AND CORONARY ANGIOGRAPHY;  Surgeon: Leonie Man, MD;  Location: [facility] LAB: Mild-moderate pulmonary hypertension.  Hyperdynamic ventricle noted.  EF 55-65% -hyperdynamic (unable to measure LVOT gradient).  Mildly elevated very tortuous but angiographically normal coronary arteries, suggesting hypertensive heart disease  . SHOULDER ARTHROSCOPY WITH ROTATOR CUFF REPAIR Right 1990s?  . TONSILLECTOMY  1951  . TRANSTHORACIC ECHOCARDIOGRAM  12/2016    EF 60-65% with dynamic outflow tract obstruction at rest.  Peak gradient 52 mmHg consistent with HOCM.  GRII DD area. Aortic sclerosis but no stenosis. Systolic anterior motion of mitral valve chordae. Mild mitral stenosis. Moderate LA dilation and mild RA dilation. Peak PA pressures 35 mmHg.  Marland Kitchen VENTRAL HERNIA REPAIR N/A 08/13/2016   Procedure: LAPAROSCOPIC VENTRAL HERNIA REPAIR WITH MESH;  Surgeon: Judeth Horn, MD;  Location: [facility] OR;  Service: General;  Laterality: N/A;     Family History  Problem Relation Age of Onset  . Diabetes Mother   . Hyperlipidemia Mother   . Hypertension Mother   . Heart attack Mother 102  . Angina Mother        chronic problem  . Hypertension Father   . Stroke Father    . Cancer Brother      Social History   Tobacco Use  . Smoking status: Current Every Day Smoker    Packs/day: 1.50    Years: 49.00    Pack years: 73.50    Types: Cigarettes  . Smokeless tobacco: Never Used  Substance Use Topics  . Alcohol use: Not Currently    Alcohol/week: 84.0 standard drinks    Types: 36 Cans of beer, 48 Shots of liquor per week    Comment: occ  . Drug use: Not Currently    Frequency: 1.0 times per week    Types: Marijuana, Cocaine    Comment: Cocaine + March 2017     No Known Allergies  Current Outpatient Medications  Medication Sig Dispense Refill  . aspirin EC 81 MG EC tablet Take 1 tablet (81 mg total) by mouth daily. 30 tablet 1  . buPROPion (WELLBUTRIN XL) 150 MG 24 hr tablet TAKE 1 TABLET BY MOUTH  DAILY 90 tablet 3  . busPIRone (BUSPAR) 15 MG tablet TAKE 1 TABLET BY MOUTH  TWICE DAILY 180 tablet 0  . carvedilol (COREG) 12.5 MG tablet Take by mouth 2 (two) times daily.    . celecoxib (CELEBREX) 200 MG capsule Take 1 capsule (200 mg total) by mouth daily. 90 capsule 1  . cilostazol (PLETAL) 50 MG tablet Take 1 tablet (50 mg total) by mouth 2 (two) times daily. 90 tablet 2  . escitalopram (LEXAPRO) 10 MG tablet Take 1 tablet (10 mg total) by mouth daily. 90 tablet 1  . fenofibrate micronized (LOFIBRA) 134 MG capsule Take 1 capsule (134 mg total) by mouth daily before breakfast. NEEDS APPOINTMENT FOR FUTURE REFILLS 15 capsule 0  . folic acid (FOLVITE) 1 MG tablet Take 1 tablet (1 mg total) by mouth daily. 30 tablet 0  . furosemide (LASIX) 40 MG tablet TAKE 1 TABLET BY MOUTH  DAILY 90 tablet 3  . gabapentin (NEURONTIN) 100 MG capsule Take 100 mg by mouth 3 (three) times daily. 2 in the morning, 1 in the afternoon and 2 at night.    . meclizine (ANTIVERT) 25 MG tablet Take 1 tablet (25 mg total) by mouth 3 (three) times daily as needed for dizziness. 30 tablet 0  . Multiple Vitamin (MULTIVITAMIN WITH MINERALS) TABS tablet Take 1 tablet by mouth daily.     . nicotine (NICODERM CQ - DOSED IN MG/24 HOURS) 21 mg/24hr patch Place 1 patch (21 mg total) onto the skin daily. 28 patch 0  . pantoprazole (PROTONIX) 40 MG tablet Take 1 tablet (40 mg total) by mouth daily. NEEDS APPOINTMENT FOR FUTURE REFILLS 90 tablet 1  . tiZANidine (ZANAFLEX) 2 MG tablet TAKE 1 TABLET BY MOUTH  EVERY 8 HOURS AS NEEDED FOR MUSCLE SPASM(S) 270  tablet 1  . traZODone (DESYREL) 100 MG tablet TAKE 1 TABLET BY MOUTH  DAILY AT BEDTIME 90 tablet 3  . valsartan (DIOVAN) 160 MG tablet Take 160 mg by mouth daily.    Marland Kitchen atorvastatin (LIPITOR) 40 MG tablet Take 1 tablet (40 mg total) by mouth daily. 90 tablet 1   No current facility-administered medications for this visit.      REVIEW OF SYSTEMS (Negative unless checked)  Constitutional: [] Weight loss  [] Fever  [] Chills Cardiac: [] Chest pain   [] Chest pressure   [] Palpitations   [] Shortness of breath when laying flat   [] Shortness of breath at rest   [] Shortness of breath with exertion. Vascular:  [x] Pain in legs with walking   [x] Pain in legs at rest   [] Pain in legs when laying flat   [x] Claudication   [] Pain in feet when walking  [] Pain in feet at rest  [] Pain in feet when laying flat   [] History of DVT   [] Phlebitis   [] Swelling in legs   [] Varicose veins   [] Non-healing ulcers Pulmonary:   [] Uses home oxygen   [] Productive cough   [] Hemoptysis   [] Wheeze  [] COPD   [] Asthma Neurologic:  [] Dizziness  [] Blackouts   [] Seizures   [] History of stroke   [] History of TIA  [] Aphasia   [] Temporary blindness   [] Dysphagia   [] Weakness or numbness in arms   [] Weakness or numbness in legs Musculoskeletal:  [x] Arthritis   [] Joint swelling   [x] Joint pain   [] Low back pain Hematologic:  [] Easy bruising  [] Easy bleeding   [] Hypercoagulable state   [] Anemic  [] Hepatitis Gastrointestinal:  [] Blood in stool   [] Vomiting blood  [] Gastroesophageal reflux/heartburn   [] Abdominal pain Genitourinary:  [] Chronic kidney disease   [] Difficult urination   [] Frequent urination  [] Burning with urination   [] Hematuria Skin:  [] Rashes   [] Ulcers   [] Wounds Psychological:  [] History of anxiety   []  History of major depression.    Physical Exam BP 116/64 (BP Location: Right Arm)   Pulse 67   Resp 16   Ht 5' 3.5" (1.613 m)   Wt 168 lb 9.6 oz (76.5 kg)   BMI 29.40 kg/m  Gen:  WD/WN, NAD Head: [place]/AT, No temporalis wasting. Ear/Nose/Throat: Hearing grossly intact, nares w/o erythema or drainage, oropharynx w/o Erythema/Exudate Eyes: Conjunctiva clear, sclera non-icteric  Neck: trachea midline.  No JVD.  Pulmonary:  Good air movement, respirations not labored, no use of accessory muscles  Cardiac: RRR, no JVD Vascular:  Vessel Right Left  Radial Palpable Palpable                          DP 2+ 1+  PT 1+ 1+   Gastrointestinal:. No masses, surgical incisions, or scars. Musculoskeletal: M/S 5/5 throughout.  Extremities without ischemic changes.  No deformity or atrophy. No edema. Neurologic: Sensation grossly intact in extremities.  Symmetrical.  Speech is fluent. Motor exam as listed above. Psychiatric: Judgment intact, Mood & affect appropriate for pt's clinical situation. Dermatologic: No rashes or ulcers noted.  No cellulitis or open wounds.    Radiology No results found.  Labs Recent Results (from the past 2160 hour(s))  Protime-INR     Status: None   Collection Time: 07/04/20 11:19 AM  Result Value Ref Range   Prothrombin Time 12.3 11.4 - 15.2 seconds   INR 1.0 0.8 - 1.2    Comment: (NOTE) INR goal varies based on device and disease states. Performed at [facility], [address] [place], [place] [address]   APTT     Status: None   Collection Time: 07/04/20 11:19 AM  Result Value Ref Range   aPTT 31 24 - 36 seconds    Comment: Performed at [facility], [address] [place], [place] [address]  CBC     Status: None   Collection Time: 07/04/20 11:19 AM  Result Value Ref Range   WBC 8.9  4.0 - 10.5 K/uL   RBC 4.55 3.87 - 5.11 MIL/uL   Hemoglobin 14.3 12.0 - 15.0 g/dL   HCT 41.8 36.0 - 46.0 %   MCV 91.9 80.0 - 100.0 fL   MCH 31.4 26.0 - 34.0 pg   MCHC 34.2 30.0 - 36.0 g/dL   RDW 12.7 11.5 - 15.5 %   Platelets 282 150 - 400 K/uL   nRBC 0.0 0.0 - 0.2 %    Comment: Performed at [facility], [address] [place], [place] [address]  Differential     Status: None   Collection Time: 07/04/20 11:19 AM  Result Value Ref Range   Neutrophils Relative % 45 %   Neutro Abs 4.0 1.7 - 7.7 K/uL   Lymphocytes Relative 44 %   Lymphs Abs 3.9 0.7 - 4.0 K/uL   Monocytes Relative 8 %   Monocytes Absolute 0.7 0.1 - 1.0 K/uL   Eosinophils Relative 3 %   Eosinophils Absolute 0.3 0.0 - 0.5 K/uL   Basophils Relative 0 %   Basophils Absolute 0.0 0.0 - 0.1 K/uL   Immature Granulocytes 0 %   Abs Immature Granulocytes 0.02 0.00 - 0.07 K/uL    Comment: Performed at [facility], [address] [place], [place] [address]  Comprehensive metabolic panel     Status: Abnormal   Collection Time: 07/04/20 11:19 AM  Result Value Ref Range   Sodium 139 135 - 145 mmol/L   Potassium 4.1 3.5 - 5.1 mmol/L   Chloride 99 98 - 111 mmol/L   CO2 27 22 - 32 mmol/L   Glucose, Bld 97 70 - 99 mg/dL    Comment: Glucose reference range applies only to samples taken after fasting for at least 8 hours.   BUN 27 (H) 8 - 23 mg/dL   Creatinine, Ser 1.48 (H) 0.44 - 1.00 mg/dL   Calcium 9.4 8.9 - 10.3 mg/dL   Total Protein 7.3 6.5 - 8.1 g/dL   Albumin 4.5 3.5 - 5.0 g/dL   AST 19 15 - 41 U/L   ALT 14 0 - 44 U/L   Alkaline Phosphatase 40 38 - 126 U/L   Total Bilirubin 0.7 0.3 - 1.2 mg/dL   GFR, Estimated 38 (L) >60 mL/min    Comment: (NOTE) Calculated using the CKD-EPI Creatinine Equation (2021)    Anion gap 13 5 - 15    Comment: Performed at [facility], [address] [place], [place] [address]  Brain natriuretic peptide     Status: Abnormal   Collection Time: 07/04/20 11:19  AM  Result Value Ref Range   B Natriuretic Peptide 137.4 (H) 0.0 - 100.0 pg/mL    Comment: Performed at [facility], [place], [place] [address]  SARS CORONAVIRUS 2 (TAT 6-24 HRS) Nasopharyngeal Nasopharyngeal Swab     Status: None   Collection Time: 07/04/20  1:34 PM   Specimen: Nasopharyngeal Swab  Result Value Ref Range   SARS Coronavirus 2 NEGATIVE NEGATIVE    Comment: (NOTE)  SARS-CoV-2 target nucleic acids are NOT DETECTED.  The SARS-CoV-2 RNA is generally detectable in upper and lower respiratory specimens during the acute phase of infection. Negative results do not preclude SARS-CoV-2 infection, do not rule out co-infections with other pathogens, and should not be used as the sole basis for treatment or other patient management decisions. Negative results must be combined with clinical observations, patient history, and epidemiological information. The expected result is Negative.  Fact Sheet for Patients: SugarRoll.be  Fact Sheet for Healthcare Providers: https://www.woods-mathews.com/  This test is not yet approved or cleared by the [place] FDA and  has been authorized for detection and/or diagnosis of SARS-CoV-2 by FDA under an Emergency Use Authorization (EUA). This EUA will remain  in effect (meaning this test can be used) for the duration of the COVID-19 declaration under Se ction 564(b)(1) of the Act, 21 U.S.C. section 360bbb-3(b)(1), unless the authorization is terminated or revoked sooner.  Performed at [facility], [place] [address], [place], [place] [address]   Urine Drug Screen, Qualitative ([place] only)     Status: None   Collection Time: 07/04/20  1:35 PM  Result Value Ref Range   Tricyclic, Ur Screen NONE DETECTED NONE DETECTED   Amphetamines, Ur Screen NONE DETECTED NONE DETECTED   MDMA (Ecstasy)Ur Screen NONE DETECTED NONE DETECTED   Cocaine Metabolite,Ur [place] NONE DETECTED NONE  DETECTED   Opiate, Ur Screen NONE DETECTED NONE DETECTED   Phencyclidine (PCP) Ur S NONE DETECTED NONE DETECTED   Cannabinoid 50 Ng, Ur [place] NONE DETECTED NONE DETECTED   Barbiturates, Ur Screen NONE DETECTED NONE DETECTED   Benzodiazepine, Ur Scrn NONE DETECTED NONE DETECTED   Methadone Scn, Ur NONE DETECTED NONE DETECTED    Comment: (NOTE) Tricyclics + metabolites, urine    Cutoff 1000 ng/mL Amphetamines + metabolites, urine  Cutoff 1000 ng/mL MDMA (Ecstasy), urine              Cutoff 500 ng/mL Cocaine Metabolite, urine          Cutoff 300 ng/mL Opiate + metabolites, urine        Cutoff 300 ng/mL Phencyclidine (PCP), urine         Cutoff 25 ng/mL Cannabinoid, urine                 Cutoff 50 ng/mL Barbiturates + metabolites, urine  Cutoff 200 ng/mL Benzodiazepine, urine              Cutoff 200 ng/mL Methadone, urine                   Cutoff 300 ng/mL  The urine drug screen provides only a preliminary, unconfirmed analytical test result and should not be used for non-medical purposes. Clinical consideration and professional judgment should be applied to any positive drug screen result due to possible interfering substances. A more specific alternate chemical method must be used in order to obtain a confirmed analytical result. Gas chromatography / mass spectrometry (GC/MS) is the preferred confirm atory method. Performed at [facility], [address] [place], [place] [address]   Sedimentation rate     Status: None   Collection Time: 07/04/20  2:02 PM  Result Value Ref Range   Sed Rate 25 0 - 30 mm/hr    Comment: Performed at [facility], [address] [place], [place] [address]  C-reactive protein     Status: None   Collection Time: 07/04/20  2:02 PM  Result Value Ref  Range   CRP 0.5 <1.0 mg/dL    Comment: Performed at [facility] [address], [place], [place] [address]  Glucose, capillary     Status: None   Collection Time: 07/04/20  9:04  PM  Result Value Ref Range   Glucose-Capillary 82 70 - 99 mg/dL    Comment: Glucose reference range applies only to samples taken after fasting for at least 8 hours.  Hemoglobin A1c     Status: None   Collection Time: 07/05/20  5:45 AM  Result Value Ref Range   Hgb A1c MFr Bld 5.1 4.8 - 5.6 %    Comment: (NOTE)         Prediabetes: 5.7 - 6.4         Diabetes: >6.4         Glycemic control for adults with diabetes: <7.0    Mean Plasma Glucose 100 mg/dL    Comment: (NOTE) Performed At: [facility] [place] [place], [place] [id number] Rush Farmer MD [id number]   Lipid panel     Status: Abnormal   Collection Time: 07/05/20  5:45 AM  Result Value Ref Range   Cholesterol 174 0 - 200 mg/dL   Triglycerides 237 (H) <150 mg/dL   HDL 37 (L) >40 mg/dL   Total CHOL/HDL Ratio 4.7 RATIO   VLDL 47 (H) 0 - 40 mg/dL   LDL Cholesterol 90 0 - 99 mg/dL    Comment:        Total Cholesterol/HDL:CHD Risk Coronary Heart Disease Risk Table                     Men   Women  1/2 Average Risk   3.4   3.3  Average Risk       5.0   4.4  2 X Average Risk   9.6   7.1  3 X Average Risk  23.4   11.0        Use the calculated Patient Ratio above and the CHD Risk Table to determine the patient's CHD Risk.        ATP III CLASSIFICATION (LDL):  <100     mg/dL   Optimal  100-129  mg/dL   Near or Above                    Optimal  130-159  mg/dL   Borderline  160-189  mg/dL   High  >190     mg/dL   Very High Performed at [facility], [address] [place], [place] [address]   Basic metabolic panel     Status: Abnormal   Collection Time: 07/06/20  9:41 PM  Result Value Ref Range   Sodium 136 135 - 145 mmol/L   Potassium 3.9 3.5 - 5.1 mmol/L   Chloride 98 98 - 111 mmol/L   CO2 27 22 - 32 mmol/L   Glucose, Bld 107 (H) 70 - 99 mg/dL    Comment: Glucose reference range applies only to samples taken after fasting for at least 8 hours.   BUN 27 (H) 8 - 23 mg/dL    Creatinine, Ser 1.23 (H) 0.44 - 1.00 mg/dL   Calcium 9.2 8.9 - 10.3 mg/dL   GFR, Estimated 47 (L) >60 mL/min    Comment: (NOTE) Calculated using the CKD-EPI Creatinine Equation (2021)    Anion gap 11 5 - 15    Comment: Performed at [facility], [address]  [place], [place] [address]  CBC     Status: Abnormal   Collection Time: 07/06/20  9:41 PM  Result Value Ref Range   WBC 11.6 (H) 4.0 - 10.5 K/uL   RBC 4.21 3.87 - 5.11 MIL/uL   Hemoglobin 13.6 12.0 - 15.0 g/dL   HCT 38.7 36.0 - 46.0 %   MCV 91.9 80.0 - 100.0 fL   MCH 32.3 26.0 - 34.0 pg   MCHC 35.1 30.0 - 36.0 g/dL   RDW 12.6 11.5 - 15.5 %   Platelets 272 150 - 400 K/uL   nRBC 0.0 0.0 - 0.2 %    Comment: Performed at [facility], [place], [place] [address]  Troponin I (High Sensitivity)     Status: Abnormal   Collection Time: 07/06/20  9:41 PM  Result Value Ref Range   Troponin I (High Sensitivity) 83 (H) <18 ng/L    Comment: (NOTE) Elevated high sensitivity troponin I (hsTnI) values and significant  changes across serial measurements may suggest ACS but many other  chronic and acute conditions are known to elevate hsTnI results.  Refer to the "Links" section for chest pain algorithms and additional  guidance. Performed at [facility], [place], [place] [address]   Troponin I (High Sensitivity)     Status: Abnormal   Collection Time: 07/06/20 11:57 PM  Result Value Ref Range   Troponin I (High Sensitivity) 82 (H) <18 ng/L    Comment: (NOTE) Elevated high sensitivity troponin I (hsTnI) values and significant  changes across serial measurements may suggest ACS but many other  chronic and acute conditions are known to elevate hsTnI results.  Refer to the "Links" section for chest pain algorithms and additional  guidance. Performed at [facility], [address] [place], [place] [address]   Ethanol     Status: None   Collection Time:  07/06/20 11:57 PM  Result Value Ref Range   Alcohol, Ethyl (B) <10 <10 mg/dL    Comment: (NOTE) Lowest detectable limit for serum alcohol is 10 mg/dL.  For medical purposes only. Performed at [facility], [address] [place], [place] [address]   Magnesium     Status: None   Collection Time: 07/06/20 11:57 PM  Result Value Ref Range   Magnesium 2.2 1.7 - 2.4 mg/dL    Comment: Performed at [facility], [address] [place], [place] [address]  Urinalysis, Routine w reflex microscopic Urine, Clean Catch     Status: Abnormal   Collection Time: 07/07/20 12:48 AM  Result Value Ref Range   Color, Urine YELLOW (A) YELLOW   APPearance CLEAR (A) CLEAR   Specific Gravity, Urine 1.006 1.005 - 1.030   pH 7.0 5.0 - 8.0   Glucose, UA NEGATIVE NEGATIVE mg/dL   Hgb urine dipstick SMALL (A) NEGATIVE   Bilirubin Urine NEGATIVE NEGATIVE   Ketones, ur NEGATIVE NEGATIVE mg/dL   Protein, ur NEGATIVE NEGATIVE mg/dL   Nitrite NEGATIVE NEGATIVE   Leukocytes,Ua SMALL (A) NEGATIVE   RBC / HPF 0-5 0 - 5 RBC/hpf   WBC, UA 6-10 0 - 5 WBC/hpf   Bacteria, UA NONE SEEN NONE SEEN   Squamous Epithelial / LPF 0-5 0 - 5   Mucus PRESENT     Comment: Performed at [facility], [address] [place], [place] [address]    Assessment/Plan:  Atherosclerosis of native arteries of extremity with intermittent claudication ([place]) I have independently reviewed the noninvasive  studies performed at her primary care physician's office.  She does have elevated velocities in the right SFA consistent with a greater than 50% stenosis.  She also has an abrupt change from triphasic to monophasic flow with low velocities in the left popliteal artery that is likely indicative of the popliteal artery occlusion or high-grade stenosis.  Recommend:  The patient has experienced increased symptoms and is now describing lifestyle limiting claudication and mild rest pain.   Given the severity of  the patient's lower extremity symptoms the patient should undergo angiography and intervention of both legs in a staged fashion.  Risk and benefits were reviewed the patient.  Indications for the procedure were reviewed.  All questions were answered, the patient agrees to proceed.   The patient should continue walking and begin a more formal exercise program.  The patient should continue antiplatelet therapy and aggressive treatment of the lipid abnormalities  The patient will follow up with me after the angiogram.   Essential hypertension blood pressure control important in reducing the progression of atherosclerotic disease. On appropriate oral medications.   HLD (hyperlipidemia) lipid control important in reducing the progression of atherosclerotic disease. Continue statin therapy       Leotis Pain 09/13/2020, 2:11 PM   This note was created with Dragon medical transcription system.  Any errors from dictation are unintentional.

## 2020-09-13 NOTE — H&P (View-Only) (Signed)
  Patient ID: Sarah Barnes, female   DOB: 12/14/1950, 70 y.o.   MRN: [id number]  Chief Complaint  Patient presents with  . New Patient (Initial Visit)    Ref Mc[place]ough stenosis rle,ble pain    HPI Mikeala A Kingsbury is a 70 y.o. female.  I am asked to see the patient by Dr. Khan for evaluation of peripheral arterial disease seen on recent noninvasive studies.  The patient describes significant leg pain in both lower extremities.  Her legs tire easily.  She has both pain in her left groin radiating down into her thigh as well as weakness and pain in her legs with activity.  No open wounds or infection.  No fevers or chills.  Patient reports the symptoms have been steadily progressing over several years.  She was told by Dr Several years ago that she had blockage in her left thigh and leg arteries but they did not do anything about it and her symptoms have just continue to progress.  I have independently reviewed the noninvasive studies performed at her primary care physician's office.  She does have elevated velocities in the right SFA consistent with a greater than 50% stenosis.  She also has an abrupt change from triphasic to monophasic flow with low velocities in the left popliteal artery that is likely indicative of the popliteal artery occlusion or high-grade stenosis.  Given these findings, she is referred for further evaluation and treatment.     Past Medical History:  Diagnosis Date  . Agoraphobia with panic disorder   . Anemia   . Arthritis    "all over" (08/13/2016)  . Chronic bronchitis (HCC)   . COPD (chronic obstructive pulmonary disease) (HCC)   . Depression   . Dyspnea    occ  . Dysrhythmia    palpitations occ due to anxiety  . ETOH abuse   . GAD (generalized anxiety disorder)   . GERD (gastroesophageal reflux disease)   . Headache    "daily til I got glasses; now have headache once in awhile" (08/13/2016)  . Hepatitis 1960   "isolated &  hospitalized for 2 weeks;  don't know which kind of hepatitis"   . Hernia, hiatal   . High cholesterol   . Hypertension   . Hypertrophic obstructive cardiomyopathy (HCC)   . Mild aortic stenosis   . Pneumonia    "once or twice" (08/13/2016)  . PONV (postoperative nausea and vomiting)   . Sciatica   . Stroke (HCC)     Past Surgical History:  Procedure Laterality Date  . ANAL FISTULECTOMY  1970s X 3  . BACK SURGERY    . HERNIA REPAIR    . KNEE ARTHROSCOPY Right   . LAPAROSCOPIC CHOLECYSTECTOMY    . LAPAROSCOPIC INCISIONAL / UMBILICAL / VENTRAL HERNIA REPAIR  08/13/2016   VHR w/mesh  . LUMBAR DISC SURGERY     "herniated disc"  . NASAL FRACTURE SURGERY  1970s X 2  . RIGHT/LEFT HEART CATH AND CORONARY ANGIOGRAPHY N/A 12/27/2016   Procedure: RIGHT/LEFT HEART CATH AND CORONARY ANGIOGRAPHY;  Surgeon: Harding, David W, MD;  Location: [facility]: Mild-moderate pulmonary hypertension.  Hyperdynamic ventricle noted.  EF 55-65% -hyperdynamic (unable to measure LVOT gradient).  Mildly elevated very tortuous but angiographically normal coronary arteries, suggesting hypertensive heart disease  . SHOULDER ARTHROSCOPY WITH ROTATOR CUFF REPAIR Right 1990s?  . TONSILLECTOMY  1951  . TRANSTHORACIC ECHOCARDIOGRAM  12/2016    EF 60-65% with dynamic outflow tract obstruction at rest.   Peak gradient 52 mmHg consistent with HOCM.  GRII DD area. Aortic sclerosis but no stenosis. Systolic anterior motion of mitral valve chordae. Mild mitral stenosis. Moderate LA dilation and mild RA dilation. Peak PA pressures 35 mmHg.  Marland Kitchen VENTRAL HERNIA REPAIR N/A 08/13/2016   Procedure: LAPAROSCOPIC VENTRAL HERNIA REPAIR WITH MESH;  Surgeon: Judeth Horn, MD;  Location: [facility] OR;  Service: General;  Laterality: N/A;     Family History  Problem Relation Age of Onset  . Diabetes Mother   . Hyperlipidemia Mother   . Hypertension Mother   . Heart attack Mother 50  . Angina Mother        chronic problem  . Hypertension Father   . Stroke Father    . Cancer Brother      Social History   Tobacco Use  . Smoking status: Current Every Day Smoker    Packs/day: 1.50    Years: 49.00    Pack years: 73.50    Types: Cigarettes  . Smokeless tobacco: Never Used  Substance Use Topics  . Alcohol use: Not Currently    Alcohol/week: 84.0 standard drinks    Types: 36 Cans of beer, 48 Shots of liquor per week    Comment: occ  . Drug use: Not Currently    Frequency: 1.0 times per week    Types: Marijuana, Cocaine    Comment: Cocaine + March 2017     No Known Allergies  Current Outpatient Medications  Medication Sig Dispense Refill  . aspirin EC 81 MG EC tablet Take 1 tablet (81 mg total) by mouth daily. 30 tablet 1  . buPROPion (WELLBUTRIN XL) 150 MG 24 hr tablet TAKE 1 TABLET BY MOUTH  DAILY 90 tablet 3  . busPIRone (BUSPAR) 15 MG tablet TAKE 1 TABLET BY MOUTH  TWICE DAILY 180 tablet 0  . carvedilol (COREG) 12.5 MG tablet Take by mouth 2 (two) times daily.    . celecoxib (CELEBREX) 200 MG capsule Take 1 capsule (200 mg total) by mouth daily. 90 capsule 1  . cilostazol (PLETAL) 50 MG tablet Take 1 tablet (50 mg total) by mouth 2 (two) times daily. 90 tablet 2  . escitalopram (LEXAPRO) 10 MG tablet Take 1 tablet (10 mg total) by mouth daily. 90 tablet 1  . fenofibrate micronized (LOFIBRA) 134 MG capsule Take 1 capsule (134 mg total) by mouth daily before breakfast. NEEDS APPOINTMENT FOR FUTURE REFILLS 15 capsule 0  . folic acid (FOLVITE) 1 MG tablet Take 1 tablet (1 mg total) by mouth daily. 30 tablet 0  . furosemide (LASIX) 40 MG tablet TAKE 1 TABLET BY MOUTH  DAILY 90 tablet 3  . gabapentin (NEURONTIN) 100 MG capsule Take 100 mg by mouth 3 (three) times daily. 2 in the morning, 1 in the afternoon and 2 at night.    . meclizine (ANTIVERT) 25 MG tablet Take 1 tablet (25 mg total) by mouth 3 (three) times daily as needed for dizziness. 30 tablet 0  . Multiple Vitamin (MULTIVITAMIN WITH MINERALS) TABS tablet Take 1 tablet by mouth daily.     . nicotine (NICODERM CQ - DOSED IN MG/24 HOURS) 21 mg/24hr patch Place 1 patch (21 mg total) onto the skin daily. 28 patch 0  . pantoprazole (PROTONIX) 40 MG tablet Take 1 tablet (40 mg total) by mouth daily. NEEDS APPOINTMENT FOR FUTURE REFILLS 90 tablet 1  . tiZANidine (ZANAFLEX) 2 MG tablet TAKE 1 TABLET BY MOUTH  EVERY 8 HOURS AS NEEDED FOR MUSCLE SPASM(S) 270  tablet 1  . traZODone (DESYREL) 100 MG tablet TAKE 1 TABLET BY MOUTH  DAILY AT BEDTIME 90 tablet 3  . valsartan (DIOVAN) 160 MG tablet Take 160 mg by mouth daily.    Marland Kitchen atorvastatin (LIPITOR) 40 MG tablet Take 1 tablet (40 mg total) by mouth daily. 90 tablet 1   No current facility-administered medications for this visit.      REVIEW OF SYSTEMS (Negative unless checked)  Constitutional: [] Weight loss  [] Fever  [] Chills Cardiac: [] Chest pain   [] Chest pressure   [] Palpitations   [] Shortness of breath when laying flat   [] Shortness of breath at rest   [] Shortness of breath with exertion. Vascular:  [x] Pain in legs with walking   [x] Pain in legs at rest   [] Pain in legs when laying flat   [x] Claudication   [] Pain in feet when walking  [] Pain in feet at rest  [] Pain in feet when laying flat   [] History of DVT   [] Phlebitis   [] Swelling in legs   [] Varicose veins   [] Non-healing ulcers Pulmonary:   [] Uses home oxygen   [] Productive cough   [] Hemoptysis   [] Wheeze  [] COPD   [] Asthma Neurologic:  [] Dizziness  [] Blackouts   [] Seizures   [] History of stroke   [] History of TIA  [] Aphasia   [] Temporary blindness   [] Dysphagia   [] Weakness or numbness in arms   [] Weakness or numbness in legs Musculoskeletal:  [x] Arthritis   [] Joint swelling   [x] Joint pain   [] Low back pain Hematologic:  [] Easy bruising  [] Easy bleeding   [] Hypercoagulable state   [] Anemic  [] Hepatitis Gastrointestinal:  [] Blood in stool   [] Vomiting blood  [] Gastroesophageal reflux/heartburn   [] Abdominal pain Genitourinary:  [] Chronic kidney disease   [] Difficult urination   [] Frequent urination  [] Burning with urination   [] Hematuria Skin:  [] Rashes   [] Ulcers   [] Wounds Psychological:  [] History of anxiety   []  History of major depression.    Physical Exam BP 116/64 (BP Location: Right Arm)   Pulse 67   Resp 16   Ht 5' 3.5" (1.613 m)   Wt 168 lb 9.6 oz (76.5 kg)   BMI 29.40 kg/m  Gen:  WD/WN, NAD Head: [place]/AT, No temporalis wasting. Ear/Nose/Throat: Hearing grossly intact, nares w/o erythema or drainage, oropharynx w/o Erythema/Exudate Eyes: Conjunctiva clear, sclera non-icteric  Neck: trachea midline.  No JVD.  Pulmonary:  Good air movement, respirations not labored, no use of accessory muscles  Cardiac: RRR, no JVD Vascular:  Vessel Right Left  Radial Palpable Palpable                          DP 2+ 1+  PT 1+ 1+   Gastrointestinal:. No masses, surgical incisions, or scars. Musculoskeletal: M/S 5/5 throughout.  Extremities without ischemic changes.  No deformity or atrophy. No edema. Neurologic: Sensation grossly intact in extremities.  Symmetrical.  Speech is fluent. Motor exam as listed above. Psychiatric: Judgment intact, Mood & affect appropriate for pt's clinical situation. Dermatologic: No rashes or ulcers noted.  No cellulitis or open wounds.    Radiology No results found.  Labs Recent Results (from the past 2160 hour(s))  Protime-INR     Status: None   Collection Time: 07/04/20 11:19 AM  Result Value Ref Range   Prothrombin Time 12.3 11.4 - 15.2 seconds   INR 1.0 0.8 - 1.2    Comment: (NOTE) INR goal varies based on device and disease states. Performed at [facility], [address] [place], [place] [address]   APTT     Status: None   Collection Time: 07/04/20 11:19 AM  Result Value Ref Range   aPTT 31 24 - 36 seconds    Comment: Performed at [facility], [address] [place], [place] [address]  CBC     Status: None   Collection Time: 07/04/20 11:19 AM  Result Value Ref Range   WBC 8.9  4.0 - 10.5 K/uL   RBC 4.55 3.87 - 5.11 MIL/uL   Hemoglobin 14.3 12.0 - 15.0 g/dL   HCT 41.8 36.0 - 46.0 %   MCV 91.9 80.0 - 100.0 fL   MCH 31.4 26.0 - 34.0 pg   MCHC 34.2 30.0 - 36.0 g/dL   RDW 12.7 11.5 - 15.5 %   Platelets 282 150 - 400 K/uL   nRBC 0.0 0.0 - 0.2 %    Comment: Performed at [facility], [address] [place], [place] [address]  Differential     Status: None   Collection Time: 07/04/20 11:19 AM  Result Value Ref Range   Neutrophils Relative % 45 %   Neutro Abs 4.0 1.7 - 7.7 K/uL   Lymphocytes Relative 44 %   Lymphs Abs 3.9 0.7 - 4.0 K/uL   Monocytes Relative 8 %   Monocytes Absolute 0.7 0.1 - 1.0 K/uL   Eosinophils Relative 3 %   Eosinophils Absolute 0.3 0.0 - 0.5 K/uL   Basophils Relative 0 %   Basophils Absolute 0.0 0.0 - 0.1 K/uL   Immature Granulocytes 0 %   Abs Immature Granulocytes 0.02 0.00 - 0.07 K/uL    Comment: Performed at [facility], [address] [place], [place] [address]  Comprehensive metabolic panel     Status: Abnormal   Collection Time: 07/04/20 11:19 AM  Result Value Ref Range   Sodium 139 135 - 145 mmol/L   Potassium 4.1 3.5 - 5.1 mmol/L   Chloride 99 98 - 111 mmol/L   CO2 27 22 - 32 mmol/L   Glucose, Bld 97 70 - 99 mg/dL    Comment: Glucose reference range applies only to samples taken after fasting for at least 8 hours.   BUN 27 (H) 8 - 23 mg/dL   Creatinine, Ser 1.48 (H) 0.44 - 1.00 mg/dL   Calcium 9.4 8.9 - 10.3 mg/dL   Total Protein 7.3 6.5 - 8.1 g/dL   Albumin 4.5 3.5 - 5.0 g/dL   AST 19 15 - 41 U/L   ALT 14 0 - 44 U/L   Alkaline Phosphatase 40 38 - 126 U/L   Total Bilirubin 0.7 0.3 - 1.2 mg/dL   GFR, Estimated 38 (L) >60 mL/min    Comment: (NOTE) Calculated using the CKD-EPI Creatinine Equation (2021)    Anion gap 13 5 - 15    Comment: Performed at [facility], [address] [place], [place] [address]  Brain natriuretic peptide     Status: Abnormal   Collection Time: 07/04/20 11:19  AM  Result Value Ref Range   B Natriuretic Peptide 137.4 (H) 0.0 - 100.0 pg/mL    Comment: Performed at [facility], [place], [place] [address]  SARS CORONAVIRUS 2 (TAT 6-24 HRS) Nasopharyngeal Nasopharyngeal Swab     Status: None   Collection Time: 07/04/20  1:34 PM   Specimen: Nasopharyngeal Swab  Result Value Ref Range   SARS Coronavirus 2 NEGATIVE NEGATIVE    Comment: (NOTE)  SARS-CoV-2 target nucleic acids are NOT DETECTED.  The SARS-CoV-2 RNA is generally detectable in upper and lower respiratory specimens during the acute phase of infection. Negative results do not preclude SARS-CoV-2 infection, do not rule out co-infections with other pathogens, and should not be used as the sole basis for treatment or other patient management decisions. Negative results must be combined with clinical observations, patient history, and epidemiological information. The expected result is Negative.  Fact Sheet for Patients: SugarRoll.be  Fact Sheet for Healthcare Providers: https://www.woods-mathews.com/  This test is not yet approved or cleared by the [place] FDA and  has been authorized for detection and/or diagnosis of SARS-CoV-2 by FDA under an Emergency Use Authorization (EUA). This EUA will remain  in effect (meaning this test can be used) for the duration of the COVID-19 declaration under Se ction 564(b)(1) of the Act, 21 U.S.C. section 360bbb-3(b)(1), unless the authorization is terminated or revoked sooner.  Performed at [facility], [place] [address] [place], [place] [address]   Urine Drug Screen, Qualitative ([place] only)     Status: None   Collection Time: 07/04/20  1:35 PM  Result Value Ref Range   Tricyclic, Ur Screen NONE DETECTED NONE DETECTED   Amphetamines, Ur Screen NONE DETECTED NONE DETECTED   MDMA (Ecstasy)Ur Screen NONE DETECTED NONE DETECTED   Cocaine Metabolite,Ur [place] NONE DETECTED NONE  DETECTED   Opiate, Ur Screen NONE DETECTED NONE DETECTED   Phencyclidine (PCP) Ur S NONE DETECTED NONE DETECTED   Cannabinoid 50 Ng, Ur  NONE DETECTED NONE DETECTED   Barbiturates, Ur Screen NONE DETECTED NONE DETECTED   Benzodiazepine, Ur Scrn NONE DETECTED NONE DETECTED   Methadone Scn, Ur NONE DETECTED NONE DETECTED    Comment: (NOTE) Tricyclics + metabolites, urine    Cutoff 1000 ng/mL Amphetamines + metabolites, urine  Cutoff 1000 ng/mL MDMA (Ecstasy), urine              Cutoff 500 ng/mL Cocaine Metabolite, urine          Cutoff 300 ng/mL Opiate + metabolites, urine        Cutoff 300 ng/mL Phencyclidine (PCP), urine         Cutoff 25 ng/mL Cannabinoid, urine                 Cutoff 50 ng/mL Barbiturates + metabolites, urine  Cutoff 200 ng/mL Benzodiazepine, urine              Cutoff 200 ng/mL Methadone, urine                   Cutoff 300 ng/mL  The urine drug screen provides only a preliminary, unconfirmed analytical test result and should not be used for non-medical purposes. Clinical consideration and professional judgment should be applied to any positive drug screen result due to possible interfering substances. A more specific alternate chemical method must be used in order to obtain a confirmed analytical result. Gas chromatography / mass spectrometry (GC/MS) is the preferred confirm atory method. Performed at [facility], [address] [place], [place] [address]   Sedimentation rate     Status: None   Collection Time: 07/04/20  2:02 PM  Result Value Ref Range   Sed Rate 25 0 - 30 mm/hr    Comment: Performed at [facility], [address] [place],  [address]  C-reactive protein     Status: None   Collection Time: 07/04/20  2:02 PM  Result Value Ref  Range   CRP 0.5 <1.0 mg/dL    Comment: Performed at [place] [address], [place], [place] [address]  Glucose, capillary     Status: None   Collection Time: 07/04/20  9:04  PM  Result Value Ref Range   Glucose-Capillary 82 70 - 99 mg/dL    Comment: Glucose reference range applies only to samples taken after fasting for at least 8 hours.  Hemoglobin A1c     Status: None   Collection Time: 07/05/20  5:45 AM  Result Value Ref Range   Hgb A1c MFr Bld 5.1 4.8 - 5.6 %    Comment: (NOTE)         Prediabetes: 5.7 - 6.4         Diabetes: >6.4         Glycemic control for adults with diabetes: <7.0    Mean Plasma Glucose 100 mg/dL    Comment: (NOTE) Performed At: [facility] [place], [place] [id number] Rush Farmer MD [id number]   Lipid panel     Status: Abnormal   Collection Time: 07/05/20  5:45 AM  Result Value Ref Range   Cholesterol 174 0 - 200 mg/dL   Triglycerides 237 (H) <150 mg/dL   HDL 37 (L) >40 mg/dL   Total CHOL/HDL Ratio 4.7 RATIO   VLDL 47 (H) 0 - 40 mg/dL   LDL Cholesterol 90 0 - 99 mg/dL    Comment:        Total Cholesterol/HDL:CHD Risk Coronary Heart Disease Risk Table                     Men   Women  1/2 Average Risk   3.4   3.3  Average Risk       5.0   4.4  2 X Average Risk   9.6   7.1  3 X Average Risk  23.4   11.0        Use the calculated Patient Ratio above and the CHD Risk Table to determine the patient's CHD Risk.        ATP III CLASSIFICATION (LDL):  <100     mg/dL   Optimal  100-129  mg/dL   Near or Above                    Optimal  130-159  mg/dL   Borderline  160-189  mg/dL   High  >190     mg/dL   Very High Performed at [facility], [address] [place], [place] [address]   Basic metabolic panel     Status: Abnormal   Collection Time: 07/06/20  9:41 PM  Result Value Ref Range   Sodium 136 135 - 145 mmol/L   Potassium 3.9 3.5 - 5.1 mmol/L   Chloride 98 98 - 111 mmol/L   CO2 27 22 - 32 mmol/L   Glucose, Bld 107 (H) 70 - 99 mg/dL    Comment: Glucose reference range applies only to samples taken after fasting for at least 8 hours.   BUN 27 (H) 8 - 23 mg/dL    Creatinine, Ser 1.23 (H) 0.44 - 1.00 mg/dL   Calcium 9.2 8.9 - 10.3 mg/dL   GFR, Estimated 47 (L) >60 mL/min    Comment: (NOTE) Calculated using the CKD-EPI Creatinine Equation (2021)    Anion gap 11 5 - 15    Comment: Performed at [facility], [address]  [place], [place] [address]  CBC     Status: Abnormal   Collection Time: 07/06/20  9:41 PM  Result Value Ref Range   WBC 11.6 (H) 4.0 - 10.5 K/uL   RBC 4.21 3.87 - 5.11 MIL/uL   Hemoglobin 13.6 12.0 - 15.0 g/dL   HCT 38.7 36.0 - 46.0 %   MCV 91.9 80.0 - 100.0 fL   MCH 32.3 26.0 - 34.0 pg   MCHC 35.1 30.0 - 36.0 g/dL   RDW 12.6 11.5 - 15.5 %   Platelets 272 150 - 400 K/uL   nRBC 0.0 0.0 - 0.2 %    Comment: Performed at [facility], [place], [place] [address]  Troponin I (High Sensitivity)     Status: Abnormal   Collection Time: 07/06/20  9:41 PM  Result Value Ref Range   Troponin I (High Sensitivity) 83 (H) <18 ng/L    Comment: (NOTE) Elevated high sensitivity troponin I (hsTnI) values and significant  changes across serial measurements may suggest ACS but many other  chronic and acute conditions are known to elevate hsTnI results.  Refer to the "Links" section for chest pain algorithms and additional  guidance. Performed at [facility], [place], [place] [address]   Troponin I (High Sensitivity)     Status: Abnormal   Collection Time: 07/06/20 11:57 PM  Result Value Ref Range   Troponin I (High Sensitivity) 82 (H) <18 ng/L    Comment: (NOTE) Elevated high sensitivity troponin I (hsTnI) values and significant  changes across serial measurements may suggest ACS but many other  chronic and acute conditions are known to elevate hsTnI results.  Refer to the "Links" section for chest pain algorithms and additional  guidance. Performed at [facility], [address] [place], [place] [address]   Ethanol     Status: None   Collection Time:  07/06/20 11:57 PM  Result Value Ref Range   Alcohol, Ethyl (B) <10 <10 mg/dL    Comment: (NOTE) Lowest detectable limit for serum alcohol is 10 mg/dL.  For medical purposes only. Performed at [facility], [address] [place], [place] [address]   Magnesium     Status: None   Collection Time: 07/06/20 11:57 PM  Result Value Ref Range   Magnesium 2.2 1.7 - 2.4 mg/dL    Comment: Performed at [facility], [address] [place], [place] [address]  Urinalysis, Routine w reflex microscopic Urine, Clean Catch     Status: Abnormal   Collection Time: 07/07/20 12:48 AM  Result Value Ref Range   Color, Urine YELLOW (A) YELLOW   APPearance CLEAR (A) CLEAR   Specific Gravity, Urine 1.006 1.005 - 1.030   pH 7.0 5.0 - 8.0   Glucose, UA NEGATIVE NEGATIVE mg/dL   Hgb urine dipstick SMALL (A) NEGATIVE   Bilirubin Urine NEGATIVE NEGATIVE   Ketones, ur NEGATIVE NEGATIVE mg/dL   Protein, ur NEGATIVE NEGATIVE mg/dL   Nitrite NEGATIVE NEGATIVE   Leukocytes,Ua SMALL (A) NEGATIVE   RBC / HPF 0-5 0 - 5 RBC/hpf   WBC, UA 6-10 0 - 5 WBC/hpf   Bacteria, UA NONE SEEN NONE SEEN   Squamous Epithelial / LPF 0-5 0 - 5   Mucus PRESENT     Comment: Performed at [facility], [address] [place], [place] [address]    Assessment/Plan:  Atherosclerosis of native arteries of extremity with intermittent claudication ([place]) I have independently reviewed the noninvasive  studies performed at her primary care physician's office.  She does have elevated velocities in the right SFA consistent with a greater than 50% stenosis.  She also has an abrupt change from triphasic to monophasic flow with low velocities in the left popliteal artery that is likely indicative of the popliteal artery occlusion or high-grade stenosis.  Recommend:  The patient has experienced increased symptoms and is now describing lifestyle limiting claudication and mild rest pain.   Given the severity of  the patient's lower extremity symptoms the patient should undergo angiography and intervention of both legs in a staged fashion.  Risk and benefits were reviewed the patient.  Indications for the procedure were reviewed.  All questions were answered, the patient agrees to proceed.   The patient should continue walking and begin a more formal exercise program.  The patient should continue antiplatelet therapy and aggressive treatment of the lipid abnormalities  The patient will follow up with me after the angiogram.   Essential hypertension blood pressure control important in reducing the progression of atherosclerotic disease. On appropriate oral medications.   HLD (hyperlipidemia) lipid control important in reducing the progression of atherosclerotic disease. Continue statin therapy       Leotis Pain 09/13/2020, 2:11 PM   This note was created with Dragon medical transcription system.  Any errors from dictation are unintentional.

## 2020-09-13 NOTE — H&P (View-Only) (Signed)
Patient ID: Sarah Barnes, female   DOB: 20-Feb-1951, 70 y.o.   MRN: [id number]  Chief Complaint  Patient presents with  . New Patient (Initial Visit)    Ref [place] stenosis rle,ble pain    HPI Sarah Barnes is a 70 y.o. female.  I am asked to see the patient by Dr. Humphrey Rolls for evaluation of peripheral arterial disease seen on recent noninvasive studies.  The patient describes significant leg pain in both lower extremities.  Her legs tire easily.  She has both pain in her left groin radiating down into her thigh as well as weakness and pain in her legs with activity.  No open wounds or infection.  No fevers or chills.  Patient reports the symptoms have been steadily progressing over several years.  She was told by Dr Several years ago that she had blockage in her left thigh and leg arteries but they did not do anything about it and her symptoms have just continue to progress.  I have independently reviewed the noninvasive studies performed at her primary care physician's office.  She does have elevated velocities in the right SFA consistent with a greater than 50% stenosis.  She also has an abrupt change from triphasic to monophasic flow with low velocities in the left popliteal artery that is likely indicative of the popliteal artery occlusion or high-grade stenosis.  Given these findings, she is referred for further evaluation and treatment.     Past Medical History:  Diagnosis Date  . Agoraphobia with panic disorder   . Anemia   . Arthritis    "all over" (08/13/2016)  . Chronic bronchitis ([place])   . COPD (chronic obstructive pulmonary disease) ([place])   . Depression   . Dyspnea    occ  . Dysrhythmia    palpitations occ due to anxiety  . ETOH abuse   . GAD (generalized anxiety disorder)   . GERD (gastroesophageal reflux disease)   . Headache    "daily til I got glasses; now have headache once in awhile" (08/13/2016)  . Hepatitis 1960   "isolated &  hospitalized for 2 weeks;  don't know which kind of hepatitis"   . Hernia, hiatal   . High cholesterol   . Hypertension   . Hypertrophic obstructive cardiomyopathy ([place])   . Mild aortic stenosis   . Pneumonia    "once or twice" (08/13/2016)  . PONV (postoperative nausea and vomiting)   . Sciatica   . Stroke [facility])     Past Surgical History:  Procedure Laterality Date  . ANAL FISTULECTOMY  1970s X 3  . BACK SURGERY    . HERNIA REPAIR    . KNEE ARTHROSCOPY Right   . LAPAROSCOPIC CHOLECYSTECTOMY    . LAPAROSCOPIC INCISIONAL / UMBILICAL / VENTRAL HERNIA REPAIR  08/13/2016   VHR w/mesh  . LUMBAR DISC SURGERY     "herniated disc"  . NASAL FRACTURE SURGERY  1970s X 2  . RIGHT/LEFT HEART CATH AND CORONARY ANGIOGRAPHY N/A 12/27/2016   Procedure: RIGHT/LEFT HEART CATH AND CORONARY ANGIOGRAPHY;  Surgeon: Leonie Man, MD;  Location: [place] INVASIVE CV LAB: Mild-moderate pulmonary hypertension.  Hyperdynamic ventricle noted.  EF 55-65% -hyperdynamic (unable to measure LVOT gradient).  Mildly elevated very tortuous but angiographically normal coronary arteries, suggesting hypertensive heart disease  . SHOULDER ARTHROSCOPY WITH ROTATOR CUFF REPAIR Right 1990s?  . TONSILLECTOMY  1951  . TRANSTHORACIC ECHOCARDIOGRAM  12/2016    EF 60-65% with dynamic outflow tract obstruction at rest.  Peak gradient 52 mmHg consistent with HOCM.  GRII DD area. Aortic sclerosis but no stenosis. Systolic anterior motion of mitral valve chordae. Mild mitral stenosis. Moderate LA dilation and mild RA dilation. Peak PA pressures 35 mmHg.  Marland Kitchen VENTRAL HERNIA REPAIR N/A 08/13/2016   Procedure: LAPAROSCOPIC VENTRAL HERNIA REPAIR WITH MESH;  Surgeon: Judeth Horn, MD;  Location: [facility] OR;  Service: General;  Laterality: N/A;     Family History  Problem Relation Age of Onset  . Diabetes Mother   . Hyperlipidemia Mother   . Hypertension Mother   . Heart attack Mother 60  . Angina Mother        chronic problem  . Hypertension Father   . Stroke Father    . Cancer Brother      Social History   Tobacco Use  . Smoking status: Current Every Day Smoker    Packs/day: 1.50    Years: 49.00    Pack years: 73.50    Types: Cigarettes  . Smokeless tobacco: Never Used  Substance Use Topics  . Alcohol use: Not Currently    Alcohol/week: 84.0 standard drinks    Types: 36 Cans of beer, 48 Shots of liquor per week    Comment: occ  . Drug use: Not Currently    Frequency: 1.0 times per week    Types: Marijuana, Cocaine    Comment: Cocaine + March 2017     No Known Allergies  Current Outpatient Medications  Medication Sig Dispense Refill  . aspirin EC 81 MG EC tablet Take 1 tablet (81 mg total) by mouth daily. 30 tablet 1  . buPROPion (WELLBUTRIN XL) 150 MG 24 hr tablet TAKE 1 TABLET BY MOUTH  DAILY 90 tablet 3  . busPIRone (BUSPAR) 15 MG tablet TAKE 1 TABLET BY MOUTH  TWICE DAILY 180 tablet 0  . carvedilol (COREG) 12.5 MG tablet Take by mouth 2 (two) times daily.    . celecoxib (CELEBREX) 200 MG capsule Take 1 capsule (200 mg total) by mouth daily. 90 capsule 1  . cilostazol (PLETAL) 50 MG tablet Take 1 tablet (50 mg total) by mouth 2 (two) times daily. 90 tablet 2  . escitalopram (LEXAPRO) 10 MG tablet Take 1 tablet (10 mg total) by mouth daily. 90 tablet 1  . fenofibrate micronized (LOFIBRA) 134 MG capsule Take 1 capsule (134 mg total) by mouth daily before breakfast. NEEDS APPOINTMENT FOR FUTURE REFILLS 15 capsule 0  . folic acid (FOLVITE) 1 MG tablet Take 1 tablet (1 mg total) by mouth daily. 30 tablet 0  . furosemide (LASIX) 40 MG tablet TAKE 1 TABLET BY MOUTH  DAILY 90 tablet 3  . gabapentin (NEURONTIN) 100 MG capsule Take 100 mg by mouth 3 (three) times daily. 2 in the morning, 1 in the afternoon and 2 at night.    . meclizine (ANTIVERT) 25 MG tablet Take 1 tablet (25 mg total) by mouth 3 (three) times daily as needed for dizziness. 30 tablet 0  . Multiple Vitamin (MULTIVITAMIN WITH MINERALS) TABS tablet Take 1 tablet by mouth daily.     . nicotine (NICODERM CQ - DOSED IN MG/24 HOURS) 21 mg/24hr patch Place 1 patch (21 mg total) onto the skin daily. 28 patch 0  . pantoprazole (PROTONIX) 40 MG tablet Take 1 tablet (40 mg total) by mouth daily. NEEDS APPOINTMENT FOR FUTURE REFILLS 90 tablet 1  . tiZANidine (ZANAFLEX) 2 MG tablet TAKE 1 TABLET BY MOUTH  EVERY 8 HOURS AS NEEDED FOR MUSCLE SPASM(S) 270  tablet 1  . traZODone (DESYREL) 100 MG tablet TAKE 1 TABLET BY MOUTH  DAILY AT BEDTIME 90 tablet 3  . valsartan (DIOVAN) 160 MG tablet Take 160 mg by mouth daily.    Marland Kitchen atorvastatin (LIPITOR) 40 MG tablet Take 1 tablet (40 mg total) by mouth daily. 90 tablet 1   No current facility-administered medications for this visit.      REVIEW OF SYSTEMS (Negative unless checked)  Constitutional: [] Weight loss  [] Fever  [] Chills Cardiac: [] Chest pain   [] Chest pressure   [] Palpitations   [] Shortness of breath when laying flat   [] Shortness of breath at rest   [] Shortness of breath with exertion. Vascular:  [x] Pain in legs with walking   [x] Pain in legs at rest   [] Pain in legs when laying flat   [x] Claudication   [] Pain in feet when walking  [] Pain in feet at rest  [] Pain in feet when laying flat   [] History of DVT   [] Phlebitis   [] Swelling in legs   [] Varicose veins   [] Non-healing ulcers Pulmonary:   [] Uses home oxygen   [] Productive cough   [] Hemoptysis   [] Wheeze  [] COPD   [] Asthma Neurologic:  [] Dizziness  [] Blackouts   [] Seizures   [] History of stroke   [] History of TIA  [] Aphasia   [] Temporary blindness   [] Dysphagia   [] Weakness or numbness in arms   [] Weakness or numbness in legs Musculoskeletal:  [x] Arthritis   [] Joint swelling   [x] Joint pain   [] Low back pain Hematologic:  [] Easy bruising  [] Easy bleeding   [] Hypercoagulable state   [] Anemic  [] Hepatitis Gastrointestinal:  [] Blood in stool   [] Vomiting blood  [] Gastroesophageal reflux/heartburn   [] Abdominal pain Genitourinary:  [] Chronic kidney disease   [] Difficult urination   [] Frequent urination  [] Burning with urination   [] Hematuria Skin:  [] Rashes   [] Ulcers   [] Wounds Psychological:  [] History of anxiety   []  History of major depression.    Physical Exam BP 116/64 (BP Location: Right Arm)   Pulse 67   Resp 16   Ht 5' 3.5" (1.613 m)   Wt 168 lb 9.6 oz (76.5 kg)   BMI 29.40 kg/m  Gen:  WD/WN, NAD Head: [place]/AT, No temporalis wasting. Ear/Nose/Throat: Hearing grossly intact, nares w/o erythema or drainage, oropharynx w/o Erythema/Exudate Eyes: Conjunctiva clear, sclera non-icteric  Neck: trachea midline.  No JVD.  Pulmonary:  Good air movement, respirations not labored, no use of accessory muscles  Cardiac: RRR, no JVD Vascular:  Vessel Right Left  Radial Palpable Palpable                          DP 2+ 1+  PT 1+ 1+   Gastrointestinal:. No masses, surgical incisions, or scars. Musculoskeletal: M/S 5/5 throughout.  Extremities without ischemic changes.  No deformity or atrophy. No edema. Neurologic: Sensation grossly intact in extremities.  Symmetrical.  Speech is fluent. Motor exam as listed above. Psychiatric: Judgment intact, Mood & affect appropriate for pt's clinical situation. Dermatologic: No rashes or ulcers noted.  No cellulitis or open wounds.    Radiology No results found.  Labs Recent Results (from the past 2160 hour(s))  Protime-INR     Status: None   Collection Time: 07/04/20 11:19 AM  Result Value Ref Range   Prothrombin Time 12.3 11.4 - 15.2 seconds   INR 1.0 0.8 - 1.2    Comment: (NOTE) INR goal varies based on device and disease states. Performed at [facility], [address] [place], [place] [address]   APTT     Status: None   Collection Time: 07/04/20 11:19 AM  Result Value Ref Range   aPTT 31 24 - 36 seconds    Comment: Performed at [facility], [address] [place], [place] [address]  CBC     Status: None   Collection Time: 07/04/20 11:19 AM  Result Value Ref Range   WBC 8.9  4.0 - 10.5 K/uL   RBC 4.55 3.87 - 5.11 MIL/uL   Hemoglobin 14.3 12.0 - 15.0 g/dL   HCT 41.8 36.0 - 46.0 %   MCV 91.9 80.0 - 100.0 fL   MCH 31.4 26.0 - 34.0 pg   MCHC 34.2 30.0 - 36.0 g/dL   RDW 12.7 11.5 - 15.5 %   Platelets 282 150 - 400 K/uL   nRBC 0.0 0.0 - 0.2 %    Comment: Performed at [facility], [address] [place], [place] [address]  Differential     Status: None   Collection Time: 07/04/20 11:19 AM  Result Value Ref Range   Neutrophils Relative % 45 %   Neutro Abs 4.0 1.7 - 7.7 K/uL   Lymphocytes Relative 44 %   Lymphs Abs 3.9 0.7 - 4.0 K/uL   Monocytes Relative 8 %   Monocytes Absolute 0.7 0.1 - 1.0 K/uL   Eosinophils Relative 3 %   Eosinophils Absolute 0.3 0.0 - 0.5 K/uL   Basophils Relative 0 %   Basophils Absolute 0.0 0.0 - 0.1 K/uL   Immature Granulocytes 0 %   Abs Immature Granulocytes 0.02 0.00 - 0.07 K/uL    Comment: Performed at [facility], [address] [place], [place] [address]  Comprehensive metabolic panel     Status: Abnormal   Collection Time: 07/04/20 11:19 AM  Result Value Ref Range   Sodium 139 135 - 145 mmol/L   Potassium 4.1 3.5 - 5.1 mmol/L   Chloride 99 98 - 111 mmol/L   CO2 27 22 - 32 mmol/L   Glucose, Bld 97 70 - 99 mg/dL    Comment: Glucose reference range applies only to samples taken after fasting for at least 8 hours.   BUN 27 (H) 8 - 23 mg/dL   Creatinine, Ser 1.48 (H) 0.44 - 1.00 mg/dL   Calcium 9.4 8.9 - 10.3 mg/dL   Total Protein 7.3 6.5 - 8.1 g/dL   Albumin 4.5 3.5 - 5.0 g/dL   AST 19 15 - 41 U/L   ALT 14 0 - 44 U/L   Alkaline Phosphatase 40 38 - 126 U/L   Total Bilirubin 0.7 0.3 - 1.2 mg/dL   GFR, Estimated 38 (L) >60 mL/min    Comment: (NOTE) Calculated using the CKD-EPI Creatinine Equation (2021)    Anion gap 13 5 - 15    Comment: Performed at [facility], [address] [place], [place] [address]  Brain natriuretic peptide     Status: Abnormal   Collection Time: 07/04/20 11:19  AM  Result Value Ref Range   B Natriuretic Peptide 137.4 (H) 0.0 - 100.0 pg/mL    Comment: Performed at [facility], [place], [place] [address]  SARS CORONAVIRUS 2 (TAT 6-24 HRS) Nasopharyngeal Nasopharyngeal Swab     Status: None   Collection Time: 07/04/20  1:34 PM   Specimen: Nasopharyngeal Swab  Result Value Ref Range   SARS Coronavirus 2 NEGATIVE NEGATIVE    Comment: (NOTE)  SARS-CoV-2 target nucleic acids are NOT DETECTED.  The SARS-CoV-2 RNA is generally detectable in upper and lower respiratory specimens during the acute phase of infection. Negative results do not preclude SARS-CoV-2 infection, do not rule out co-infections with other pathogens, and should not be used as the sole basis for treatment or other patient management decisions. Negative results must be combined with clinical observations, patient history, and epidemiological information. The expected result is Negative.  Fact Sheet for Patients: SugarRoll.be  Fact Sheet for Healthcare Providers: https://www.woods-mathews.com/  This test is not yet approved or cleared by the [place] FDA and  has been authorized for detection and/or diagnosis of SARS-CoV-2 by FDA under an Emergency Use Authorization (EUA). This EUA will remain  in effect (meaning this test can be used) for the duration of the COVID-19 declaration under Se ction 564(b)(1) of the Act, 21 U.S.C. section 360bbb-3(b)(1), unless the authorization is terminated or revoked sooner.  Performed at [facility], [place] [address] [place], [place] [address]   Urine Drug Screen, Qualitative ([place] only)     Status: None   Collection Time: 07/04/20  1:35 PM  Result Value Ref Range   Tricyclic, Ur Screen NONE DETECTED NONE DETECTED   Amphetamines, Ur Screen NONE DETECTED NONE DETECTED   MDMA (Ecstasy)Ur Screen NONE DETECTED NONE DETECTED   Cocaine Metabolite,Ur [place] NONE DETECTED NONE  DETECTED   Opiate, Ur Screen NONE DETECTED NONE DETECTED   Phencyclidine (PCP) Ur S NONE DETECTED NONE DETECTED   Cannabinoid 50 Ng, Ur [place] NONE DETECTED NONE DETECTED   Barbiturates, Ur Screen NONE DETECTED NONE DETECTED   Benzodiazepine, Ur Scrn NONE DETECTED NONE DETECTED   Methadone Scn, Ur NONE DETECTED NONE DETECTED    Comment: (NOTE) Tricyclics + metabolites, urine    Cutoff 1000 ng/mL Amphetamines + metabolites, urine  Cutoff 1000 ng/mL MDMA (Ecstasy), urine              Cutoff 500 ng/mL Cocaine Metabolite, urine          Cutoff 300 ng/mL Opiate + metabolites, urine        Cutoff 300 ng/mL Phencyclidine (PCP), urine         Cutoff 25 ng/mL Cannabinoid, urine                 Cutoff 50 ng/mL Barbiturates + metabolites, urine  Cutoff 200 ng/mL Benzodiazepine, urine              Cutoff 200 ng/mL Methadone, urine                   Cutoff 300 ng/mL  The urine drug screen provides only a preliminary, unconfirmed analytical test result and should not be used for non-medical purposes. Clinical consideration and professional judgment should be applied to any positive drug screen result due to possible interfering substances. A more specific alternate chemical method must be used in order to obtain a confirmed analytical result. Gas chromatography / mass spectrometry (GC/MS) is the preferred confirm atory method. Performed at [facility], [address] [place], [place] [address]   Sedimentation rate     Status: None   Collection Time: 07/04/20  2:02 PM  Result Value Ref Range   Sed Rate 25 0 - 30 mm/hr    Comment: Performed at [facility], [address] [place], [place] [address]  C-reactive protein     Status: None   Collection Time: 07/04/20  2:02 PM  Result Value Ref  Range   CRP 0.5 <1.0 mg/dL    Comment: Performed at [place] [address], [place], [place] [address]  Glucose, capillary     Status: None   Collection Time: 07/04/20  9:04  PM  Result Value Ref Range   Glucose-Capillary 82 70 - 99 mg/dL    Comment: Glucose reference range applies only to samples taken after fasting for at least 8 hours.  Hemoglobin A1c     Status: None   Collection Time: 07/05/20  5:45 AM  Result Value Ref Range   Hgb A1c MFr Bld 5.1 4.8 - 5.6 %    Comment: (NOTE)         Prediabetes: 5.7 - 6.4         Diabetes: >6.4         Glycemic control for adults with diabetes: <7.0    Mean Plasma Glucose 100 mg/dL    Comment: (NOTE) Performed At: [facility] [place], [place] [id number] Rush Farmer MD [id number]   Lipid panel     Status: Abnormal   Collection Time: 07/05/20  5:45 AM  Result Value Ref Range   Cholesterol 174 0 - 200 mg/dL   Triglycerides 237 (H) <150 mg/dL   HDL 37 (L) >40 mg/dL   Total CHOL/HDL Ratio 4.7 RATIO   VLDL 47 (H) 0 - 40 mg/dL   LDL Cholesterol 90 0 - 99 mg/dL    Comment:        Total Cholesterol/HDL:CHD Risk Coronary Heart Disease Risk Table                     Men   Women  1/2 Average Risk   3.4   3.3  Average Risk       5.0   4.4  2 X Average Risk   9.6   7.1  3 X Average Risk  23.4   11.0        Use the calculated Patient Ratio above and the CHD Risk Table to determine the patient's CHD Risk.        ATP III CLASSIFICATION (LDL):  <100     mg/dL   Optimal  100-129  mg/dL   Near or Above                    Optimal  130-159  mg/dL   Borderline  160-189  mg/dL   High  >190     mg/dL   Very High Performed at [facility], [address] [place], [place] [address]   Basic metabolic panel     Status: Abnormal   Collection Time: 07/06/20  9:41 PM  Result Value Ref Range   Sodium 136 135 - 145 mmol/L   Potassium 3.9 3.5 - 5.1 mmol/L   Chloride 98 98 - 111 mmol/L   CO2 27 22 - 32 mmol/L   Glucose, Bld 107 (H) 70 - 99 mg/dL    Comment: Glucose reference range applies only to samples taken after fasting for at least 8 hours.   BUN 27 (H) 8 - 23 mg/dL    Creatinine, Ser 1.23 (H) 0.44 - 1.00 mg/dL   Calcium 9.2 8.9 - 10.3 mg/dL   GFR, Estimated 47 (L) >60 mL/min    Comment: (NOTE) Calculated using the CKD-EPI Creatinine Equation (2021)    Anion gap 11 5 - 15    Comment: Performed at [facility], [address]  [place], [place] [address]  CBC     Status: Abnormal   Collection Time: 07/06/20  9:41 PM  Result Value Ref Range   WBC 11.6 (H) 4.0 - 10.5 K/uL   RBC 4.21 3.87 - 5.11 MIL/uL   Hemoglobin 13.6 12.0 - 15.0 g/dL   HCT 38.7 36.0 - 46.0 %   MCV 91.9 80.0 - 100.0 fL   MCH 32.3 26.0 - 34.0 pg   MCHC 35.1 30.0 - 36.0 g/dL   RDW 12.6 11.5 - 15.5 %   Platelets 272 150 - 400 K/uL   nRBC 0.0 0.0 - 0.2 %    Comment: Performed at [facility], [place], [place] [address]  Troponin I (High Sensitivity)     Status: Abnormal   Collection Time: 07/06/20  9:41 PM  Result Value Ref Range   Troponin I (High Sensitivity) 83 (H) <18 ng/L    Comment: (NOTE) Elevated high sensitivity troponin I (hsTnI) values and significant  changes across serial measurements may suggest ACS but many other  chronic and acute conditions are known to elevate hsTnI results.  Refer to the "Links" section for chest pain algorithms and additional  guidance. Performed at [facility], [place], [place] [address]   Troponin I (High Sensitivity)     Status: Abnormal   Collection Time: 07/06/20 11:57 PM  Result Value Ref Range   Troponin I (High Sensitivity) 82 (H) <18 ng/L    Comment: (NOTE) Elevated high sensitivity troponin I (hsTnI) values and significant  changes across serial measurements may suggest ACS but many other  chronic and acute conditions are known to elevate hsTnI results.  Refer to the "Links" section for chest pain algorithms and additional  guidance. Performed at [facility], [address] [place], [place] [address]   Ethanol     Status: None   Collection Time:  07/06/20 11:57 PM  Result Value Ref Range   Alcohol, Ethyl (B) <10 <10 mg/dL    Comment: (NOTE) Lowest detectable limit for serum alcohol is 10 mg/dL.  For medical purposes only. Performed at [facility], [address] [place], [place] [address]   Magnesium     Status: None   Collection Time: 07/06/20 11:57 PM  Result Value Ref Range   Magnesium 2.2 1.7 - 2.4 mg/dL    Comment: Performed at [facility], [address] [place], [place] [address]  Urinalysis, Routine w reflex microscopic Urine, Clean Catch     Status: Abnormal   Collection Time: 07/07/20 12:48 AM  Result Value Ref Range   Color, Urine YELLOW (A) YELLOW   APPearance CLEAR (A) CLEAR   Specific Gravity, Urine 1.006 1.005 - 1.030   pH 7.0 5.0 - 8.0   Glucose, UA NEGATIVE NEGATIVE mg/dL   Hgb urine dipstick SMALL (A) NEGATIVE   Bilirubin Urine NEGATIVE NEGATIVE   Ketones, ur NEGATIVE NEGATIVE mg/dL   Protein, ur NEGATIVE NEGATIVE mg/dL   Nitrite NEGATIVE NEGATIVE   Leukocytes,Ua SMALL (A) NEGATIVE   RBC / HPF 0-5 0 - 5 RBC/hpf   WBC, UA 6-10 0 - 5 WBC/hpf   Bacteria, UA NONE SEEN NONE SEEN   Squamous Epithelial / LPF 0-5 0 - 5   Mucus PRESENT     Comment: Performed at [facility], [address] [place], [place] [address]    Assessment/Plan:  Atherosclerosis of native arteries of extremity with intermittent claudication ([place]) I have independently reviewed the noninvasive  studies performed at her primary care physician's office.  She does have elevated velocities in the right SFA consistent with a greater than 50% stenosis.  She also has an abrupt change from triphasic to monophasic flow with low velocities in the left popliteal artery that is likely indicative of the popliteal artery occlusion or high-grade stenosis.  Recommend:  The patient has experienced increased symptoms and is now describing lifestyle limiting claudication and mild rest pain.   Given the severity of  the patient's lower extremity symptoms the patient should undergo angiography and intervention of both legs in a staged fashion.  Risk and benefits were reviewed the patient.  Indications for the procedure were reviewed.  All questions were answered, the patient agrees to proceed.   The patient should continue walking and begin a more formal exercise program.  The patient should continue antiplatelet therapy and aggressive treatment of the lipid abnormalities  The patient will follow up with me after the angiogram.   Essential hypertension blood pressure control important in reducing the progression of atherosclerotic disease. On appropriate oral medications.   HLD (hyperlipidemia) lipid control important in reducing the progression of atherosclerotic disease. Continue statin therapy       Leotis Pain 09/13/2020, 2:11 PM   This note was created with Dragon medical transcription system.  Any errors from dictation are unintentional.

## 2020-09-13 NOTE — Assessment & Plan Note (Signed)
lipid control important in reducing the progression of atherosclerotic disease. Continue statin therapy  

## 2020-09-13 NOTE — Assessment & Plan Note (Signed)
blood pressure control important in reducing the progression of atherosclerotic disease. On appropriate oral medications.  

## 2020-09-13 NOTE — Patient Instructions (Signed)
Peripheral Vascular Disease  Peripheral vascular disease (PVD) is a disease of the blood vessels that carry blood from the heart to the rest of the body. PVD is also called peripheral artery disease (PAD) or poor circulation. PVD affects most of the body. But it affects the legs and feet the most. PVD can lead to acute limb ischemia. This happens when there is a sudden stop of blood flow to an arm or leg. This is a medical emergency. What are the causes? The most common cause of PVD is a buildup of a fatty substance (plaque) inside your arteries. This decreases blood flow. Plaque can break off and block blood in a smaller artery. This can lead to acute limb ischemia. Other common causes of PVD include:  Blood clots inside the blood vessels.  Injuries to blood vessels.  Irritation and swelling of blood vessels.  Sudden tightening of the blood vessel (spasms). What increases the risk?  A family history of PVD.  Medical conditions, including: ? High cholesterol. ? Diabetes. ? High blood pressure. ? Heart disease. ? Past problems with blood clots. ? Past injury, such as burns or a broken bone.  Other conditions, such as: ? Buerger's disease. This is caused by swollen or irritated blood vessels in your hands and feet. ? Arthritis. ? Birth defects that affect the arteries in your legs. ? Kidney disease.  Using tobacco or nicotine products.  Not getting enough exercise.  Being very overweight (obese).  Being 50 years old or older. What are the signs or symptoms?  Cramps in your butt, legs, and feet.  Pain and weakness in your legs when you are active that goes away when you rest.  Leg pain when at rest.  Leg numbness, tingling, or weakness.  Coldness in a leg or foot, especially when compared with the other leg or foot.  Skin or hair changes. These can include: ? Hair loss. ? Shiny skin. ? Pale or bluish skin. ? Thick toenails.  Being unable to get or keep an  erection.  Tiredness (fatigue).  Weak pulse or no pulse in the feet.  Wounds and sores on the toes, feet, or legs. These take longer to heal. How is this treated? Underlying causes are treated first. Other conditions, like diabetes, high cholesterol, and blood pressure, are also treated. Treatment may include:  Lifestyle changes, such as: ? Quitting smoking. ? Getting regular exercise. ? Having a diet low in fat and cholesterol. ? Not drinking alcohol.  Taking medicines, such as: ? Blood thinners. ? Medicines to improve blood flow. ? Medicines to improve your blood cholesterol.  Procedures to: ? Open the arteries and restore blood flow. ? Insert a small mesh tube (stent) to keep a blocked vessel open. ? Create a new path for blood to flow to the body (peripheral bypass). ? Remove dead tissue from a wound. ? Remove an affected leg or arm. Follow these instructions at home: Medicines  Take over-the-counter and prescription medicines only as told by your doctor.  If you are taking blood thinners: ? Talk with your doctor before you take any medicines that have aspirin, or NSAIDs, such as ibuprofen. ? Take medicines exactly as told. Take them at the same time each day. ? Avoid doing things that could hurt or bruise you. Take action to prevent falls. ? Wear an alert bracelet or carry a card that shows you are taking blood thinners. Lifestyle  Get regular exercise. Ask your doctor about how to stay active.    Talk with your doctor about keeping a healthy weight. If needed, ask about losing weight.  Eat a diet that is low in fat and cholesterol. If you need help, talk with your doctor.  Do not drink alcohol.  Do not smoke or use any products that contain nicotine or tobacco. If you need help quitting, ask your doctor.      General instructions  Take good care of your feet. To do this: ? Wear shoes that fit well and feel good. ? Check your feet often for any cuts or  sores.  Get a flu shot (influenza vaccine) each year.  Keep all follow-up visits. Where to find more information  Society for Vascular Surgery: vascular.org  American Heart Association: heart.org  National Heart, Lung, and Blood Institute: nhlbi.nih.gov Contact a doctor if:  You have cramps in your legs when you walk.  You have leg pain when you rest.  Your leg or foot feels cold.  Your skin changes.  You cannot get or keep an erection.  You have cuts or sores on your legs or feet that do not heal. Get help right away if:  You have sudden changes in the color and feeling of your arms or legs, such as: ? Your arm or leg turns cold, numb, and blue. ? Your arm or leg becomes red, warm, swollen, painful, or numb.  You have any signs of a stroke. "BE FAST" is an easy way to remember the main warning signs: ? B - Balance. Dizziness, sudden trouble walking, or loss of balance. ? E - Eyes. Trouble seeing or a change in how you see. ? F - Face. Sudden weakness or loss of feeling of the face. The face or eyelid may droop on one side. ? A - Arms. Weakness or loss of feeling in an arm. This happens all of a sudden and most often on one side of the body. ? S - Speech. Sudden trouble speaking, slurred speech, or trouble understanding what people say. ? T - Time. Time to call emergency services. Write down what time symptoms started.  You have other signs of a stroke, such as: ? A sudden, very bad headache with no known cause. ? Feeling like you may vomit (nausea). ? Vomiting. ? A seizure.  You have chest pain or trouble breathing. These symptoms may be an emergency. Get help right away. Call your local emergency services (911 in the [place]  Do not wait to see if the symptoms will go away.  Do not drive yourself to the hospital. Summary  Peripheral vascular disease (PVD) is a disease of the blood vessels.  PVD affects the legs and feet the most.  Symptoms may include leg  pain or leg numbness, tingling, and weakness.  Treatment may include lifestyle changes, medicines, and procedures. This information is not intended to replace advice given to you by your health care provider. Make sure you discuss any questions you have with your health care provider. Document Revised: 11/09/2019 Document Reviewed: 11/09/2019 Elsevier Patient Education  2021 Elsevier Inc.  

## 2020-09-13 NOTE — Assessment & Plan Note (Signed)
I have independently reviewed the noninvasive studies performed at her primary care physician's office.  She does have elevated velocities in the right SFA consistent with a greater than 50% stenosis.  She also has an abrupt change from triphasic to monophasic flow with low velocities in the left popliteal artery that is likely indicative of the popliteal artery occlusion or high-grade stenosis.  Recommend:  The patient has experienced increased symptoms and is now describing lifestyle limiting claudication and mild rest pain.   Given the severity of the patient's lower extremity symptoms the patient should undergo angiography and intervention of both legs in a staged fashion.  Risk and benefits were reviewed the patient.  Indications for the procedure were reviewed.  All questions were answered, the patient agrees to proceed.   The patient should continue walking and begin a more formal exercise program.  The patient should continue antiplatelet therapy and aggressive treatment of the lipid abnormalities  The patient will follow up with me after the angiogram.

## 2020-09-19 ENCOUNTER — Telehealth (INDEPENDENT_AMBULATORY_CARE_PROVIDER_SITE_OTHER): Payer: Self-pay

## 2020-09-19 NOTE — Telephone Encounter (Signed)
Patient called back and is scheduled with [facility] on 09/29/20 for a left leg angio with 8:45 am arrival and covid testing on 09/27/20 and 10/06/20 for a right leg angio with 6:45 am arrival tim to the [facility] and covid testing on 10/04/20 between 8-2 pm at the [facility]. Pre-procedure instructions were discussed and will be mailed.

## 2020-09-19 NOTE — Telephone Encounter (Signed)
I attempted to contact the patient to schedule her for a bilateral leg angio's and a message was left for a return call.

## 2020-09-20 ENCOUNTER — Other Ambulatory Visit: Payer: Self-pay

## 2020-09-20 MED ORDER — GABAPENTIN 100 MG PO CAPS: 100.0000 mg | ORAL_CAPSULE | Freq: Three times a day (TID) | ORAL | 0 refills | Status: DC

## 2020-09-20 MED ORDER — ATORVASTATIN CALCIUM 40 MG PO TABS: 40.0000 mg | ORAL_TABLET | Freq: Every day | ORAL | 1 refills | Status: DC

## 2020-09-20 MED ORDER — VALSARTAN 160 MG PO TABS: 160.0000 mg | ORAL_TABLET | Freq: Every day | ORAL | 0 refills | Status: DC

## 2020-09-21 ENCOUNTER — Ambulatory Visit (INDEPENDENT_AMBULATORY_CARE_PROVIDER_SITE_OTHER): Payer: Medicare Other

## 2020-09-21 ENCOUNTER — Other Ambulatory Visit: Payer: Self-pay

## 2020-09-21 DIAGNOSIS — I639 Cerebral infarction, unspecified: Secondary | ICD-10-CM | POA: Diagnosis not present

## 2020-09-21 LAB — CUP PACEART REMOTE DEVICE CHECK
Date Time Interrogation Session: 20220504143929
Implantable Pulse Generator Implant Date: 20220330

## 2020-09-21 MED ORDER — GABAPENTIN 100 MG PO CAPS: ORAL_CAPSULE | ORAL | 0 refills | Status: DC

## 2020-09-27 ENCOUNTER — Other Ambulatory Visit: Payer: Medicare Other

## 2020-09-29 ENCOUNTER — Encounter
Admission: RE | Disposition: A | Discharge status: Home / Self Care | Payer: Self-pay | Source: Ambulatory Visit | Attending: Vascular Surgery

## 2020-09-29 ENCOUNTER — Other Ambulatory Visit: Payer: Self-pay

## 2020-09-29 ENCOUNTER — Ambulatory Visit
Admission: RE | Admit: 2020-09-29 | Discharge: 2020-09-29 | Disposition: A | Discharge status: Home / Self Care | Payer: Medicare Other | Source: Ambulatory Visit | Attending: Vascular Surgery | Admitting: Vascular Surgery

## 2020-09-29 ENCOUNTER — Other Ambulatory Visit (INDEPENDENT_AMBULATORY_CARE_PROVIDER_SITE_OTHER): Payer: Self-pay | Admitting: Nurse Practitioner

## 2020-09-29 ENCOUNTER — Encounter: Payer: Self-pay | Admitting: Vascular Surgery

## 2020-09-29 DIAGNOSIS — Z7982 Long term (current) use of aspirin: Secondary | ICD-10-CM | POA: Insufficient documentation

## 2020-09-29 DIAGNOSIS — I10 Essential (primary) hypertension: Secondary | ICD-10-CM | POA: Insufficient documentation

## 2020-09-29 DIAGNOSIS — F17210 Nicotine dependence, cigarettes, uncomplicated: Secondary | ICD-10-CM | POA: Diagnosis not present

## 2020-09-29 DIAGNOSIS — Z79899 Other long term (current) drug therapy: Secondary | ICD-10-CM | POA: Diagnosis not present

## 2020-09-29 DIAGNOSIS — E785 Hyperlipidemia, unspecified: Secondary | ICD-10-CM | POA: Insufficient documentation

## 2020-09-29 DIAGNOSIS — F411 Generalized anxiety disorder: Secondary | ICD-10-CM

## 2020-09-29 DIAGNOSIS — I70219 Atherosclerosis of native arteries of extremities with intermittent claudication, unspecified extremity: Secondary | ICD-10-CM

## 2020-09-29 DIAGNOSIS — I70213 Atherosclerosis of native arteries of extremities with intermittent claudication, bilateral legs: Secondary | ICD-10-CM | POA: Diagnosis not present

## 2020-09-29 HISTORY — PX: LOWER EXTREMITY ANGIOGRAPHY: CATH118251

## 2020-09-29 LAB — CREATININE, SERUM
Creatinine, Ser: 1.2 mg/dL — ABNORMAL HIGH (ref 0.44–1.00)
GFR, Estimated: 48 mL/min — ABNORMAL LOW (ref >60)

## 2020-09-29 LAB — BUN: BUN: 17 mg/dL (ref 8–23)

## 2020-09-29 SURGERY — LOWER EXTREMITY ANGIOGRAPHY
Anesthesia: Moderate Sedation | Site: Leg Lower | Laterality: Left

## 2020-09-29 MED ORDER — HYDRALAZINE HCL 20 MG/ML IJ SOLN: 5.0000 mg | INTRAMUSCULAR | Status: DC | PRN

## 2020-09-29 MED ORDER — MIDAZOLAM HCL 2 MG/2ML IJ SOLN
INTRAMUSCULAR | Status: DC | PRN
  Administered 2020-09-29: 2 mg via INTRAVENOUS

## 2020-09-29 MED ORDER — SODIUM CHLORIDE 0.9% FLUSH: 3.0000 mL | INTRAVENOUS | Status: DC | PRN

## 2020-09-29 MED ORDER — CLOPIDOGREL BISULFATE 75 MG PO TABS: 75.0000 mg | ORAL_TABLET | Freq: Every day | ORAL | 11 refills | Status: DC

## 2020-09-29 MED ORDER — METHYLPREDNISOLONE SODIUM SUCC 125 MG IJ SOLR: 125.0000 mg | Freq: Once | INTRAMUSCULAR | Status: DC | PRN

## 2020-09-29 MED ORDER — FENTANYL CITRATE (PF) 100 MCG/2ML IJ SOLN
INTRAMUSCULAR | Status: DC | PRN
  Administered 2020-09-29: 50 ug via INTRAVENOUS

## 2020-09-29 MED ORDER — SODIUM CHLORIDE 0.9 % IV SOLN: INTRAVENOUS | Status: DC

## 2020-09-29 MED ORDER — MIDAZOLAM HCL 2 MG/ML PO SYRP: 8.0000 mg | ORAL_SOLUTION | Freq: Once | ORAL | Status: DC | PRN

## 2020-09-29 MED ORDER — SODIUM CHLORIDE 0.9 % IV SOLN: 250.0000 mL | INTRAVENOUS | Status: DC | PRN

## 2020-09-29 MED ORDER — MIDAZOLAM HCL 5 MG/5ML IJ SOLN
INTRAMUSCULAR | Status: AC
  Filled 2020-09-29: qty 5

## 2020-09-29 MED ORDER — CEFAZOLIN SODIUM-DEXTROSE 2-4 GM/100ML-% IV SOLN
INTRAVENOUS | Status: AC
  Filled 2020-09-29: qty 100

## 2020-09-29 MED ORDER — IODIXANOL 320 MG/ML IV SOLN
INTRAVENOUS | Status: DC | PRN
  Administered 2020-09-29: 40 mL via INTRA_ARTERIAL

## 2020-09-29 MED ORDER — FENTANYL CITRATE (PF) 100 MCG/2ML IJ SOLN
INTRAMUSCULAR | Status: AC
  Filled 2020-09-29: qty 2

## 2020-09-29 MED ORDER — ACETAMINOPHEN 325 MG PO TABS: 650.0000 mg | ORAL_TABLET | ORAL | Status: DC | PRN

## 2020-09-29 MED ORDER — CEFAZOLIN SODIUM-DEXTROSE 2-4 GM/100ML-% IV SOLN: 2.0000 g | Freq: Once | INTRAVENOUS | Status: DC

## 2020-09-29 MED ORDER — ESCITALOPRAM OXALATE 10 MG PO TABS: 10.0000 mg | ORAL_TABLET | Freq: Every day | ORAL | 1 refills | Status: DC

## 2020-09-29 MED ORDER — DIPHENHYDRAMINE HCL 50 MG/ML IJ SOLN: 50.0000 mg | Freq: Once | INTRAMUSCULAR | Status: DC | PRN

## 2020-09-29 MED ORDER — SODIUM CHLORIDE 0.9% FLUSH: 3.0000 mL | Freq: Two times a day (BID) | INTRAVENOUS | Status: DC

## 2020-09-29 MED ORDER — ONDANSETRON HCL 4 MG/2ML IJ SOLN: 4.0000 mg | Freq: Four times a day (QID) | INTRAMUSCULAR | Status: DC | PRN

## 2020-09-29 MED ORDER — CLOPIDOGREL BISULFATE 75 MG PO TABS: 75.0000 mg | ORAL_TABLET | Freq: Every day | ORAL | Status: DC

## 2020-09-29 MED ORDER — HYDROMORPHONE HCL 1 MG/ML IJ SOLN: 1.0000 mg | Freq: Once | INTRAMUSCULAR | Status: DC | PRN

## 2020-09-29 MED ORDER — HEPARIN SODIUM (PORCINE) 1000 UNIT/ML IJ SOLN
INTRAMUSCULAR | Status: AC
  Filled 2020-09-29: qty 1

## 2020-09-29 MED ORDER — LABETALOL HCL 5 MG/ML IV SOLN: 10.0000 mg | INTRAVENOUS | Status: DC | PRN

## 2020-09-29 MED ORDER — FAMOTIDINE 20 MG PO TABS: 40.0000 mg | ORAL_TABLET | Freq: Once | ORAL | Status: DC | PRN

## 2020-09-29 SURGICAL SUPPLY — 18 items
BALLN LUTONIX 5X120X130 (BALLOONS) ×2
BALLN LUTONIX 5X220X130 (BALLOONS) ×2
BALLOON LUTONIX 5X120X130 (BALLOONS) ×1 IMPLANT
BALLOON LUTONIX 5X220X130 (BALLOONS) ×1 IMPLANT
CATH ANGIO 5F PIGTAIL 65CM (CATHETERS) ×2 IMPLANT
CATH VERT 5X100 (CATHETERS) ×2 IMPLANT
COVER PROBE U/S 5X48 (MISCELLANEOUS) ×2 IMPLANT
DEVICE STARCLOSE SE CLOSURE (Vascular Products) ×2 IMPLANT
GLIDEWIRE ADV .035X260CM (WIRE) ×2 IMPLANT
KIT ENCORE 26 ADVANTAGE (KITS) ×2 IMPLANT
PACK ANGIOGRAPHY (CUSTOM PROCEDURE TRAY) ×2 IMPLANT
SHEATH ANL2 6FRX45 HC (SHEATH) ×2 IMPLANT
SHEATH BRITE TIP 5FRX11 (SHEATH) ×2 IMPLANT
STENT VIABAHN 6X100X120 (Permanent Stent) ×2 IMPLANT
SYR MEDRAD MARK 7 150ML (SYRINGE) ×2 IMPLANT
TUBING CONTRAST HIGH PRESS 48 (TUBING) ×4 IMPLANT
WIRE G V18X300CM (WIRE) ×2 IMPLANT
WIRE GUIDERIGHT .035X150 (WIRE) ×2 IMPLANT

## 2020-09-29 NOTE — Op Note (Signed)
[facility]  Percutaneous Study/Intervention Procedural Note   Date of Surgery: 09/29/2020  Surgeon(s):Rodneisha Bonnet    Assistants:none  Pre-operative Diagnosis: PAD with claudication BLE  Post-operative diagnosis:  Same  Procedure(s) Performed:             1.  Ultrasound guidance for vascular access right femoral artery             2.  Catheter placement into left common femoral artery from right femoral approach             3.  Aortogram and selective left lower extremity angiogram             4.  Percutaneous transluminal angioplasty of left proximal to mid SFA with 5 mm diameter by 22 cm length Lutonix drug-coated angioplasty balloon             5.   Stent placement to the proximal left SFA with 6 mm diameter by 10 cm length Viabahn stent  6.  StarClose closure device right femoral artery  EBL: 10 cc  Contrast: 40 cc  Fluoro Time: 3.3 minutes  Moderate Conscious Sedation Time: approximately 27 minutes using 2 mg of Versed and 50 mcg of Fentanyl              Indications:  Patient is a 70 y.o.female with disabling claudication symptoms in both lower extremities. The patient has noninvasive study showing significant SFA/popliteal disease bilaterally. The patient is brought in for angiography for further evaluation and potential treatment.   Risks and benefits are discussed and informed consent is obtained.   Procedure:  The patient was identified and appropriate procedural time out was performed.  The patient was then placed supine on the table and prepped and draped in the usual sterile fashion. Moderate conscious sedation was administered during a face to face encounter with the patient throughout the procedure with my supervision of the RN administering medicines and monitoring the patient's vital signs, pulse oximetry, telemetry and mental status throughout from the start of the procedure until the patient was taken to the recovery room. Ultrasound was used to  evaluate the right common femoral artery.  It was patent .  A digital ultrasound image was acquired.  A Seldinger needle was used to access the right common femoral artery under direct ultrasound guidance and a permanent image was performed.  A 0.035 J wire was advanced without resistance and a 5Fr sheath was placed.  Pigtail catheter was placed into the aorta and an AP aortogram was performed. This demonstrated normal renal arteries and normal aorta and iliac segments without significant stenosis. I then crossed the aortic bifurcation and advanced to the left femoral head. Selective left lower extremity angiogram was then performed. This demonstrated several areas of greater than 70% stenosis in the proximal to mid left SFA.  The mid to distal left SFA was fairly normal as was the popliteal artery.  There were two-vessel runoff through both the anterior tibial and posterior tibial arteries distally.  It should be noted that her circulation time was extremely slow suggestive of poor ejection fraction. It was felt that it was in the patient's best interest to proceed with intervention after these images to avoid a second procedure and a larger amount of contrast and fluoroscopy based off of the findings from the initial angiogram. The patient was systemically heparinized and a 6 [place] Ansell sheath was then placed over the [employer] wire. I then used a Kumpe catheter  and the advantage wire to navigate through the SFA stenoses.  I then performed angioplasty with a 5 mm diameter by 22 cm length Lutonix drug-coated angioplasty balloon inflated to 8 atm for 1 minute.  Completion imaging showed 2 areas of greater than 50% residual stenosis so I elected to stent these areas.  After exchanging for a 0.018 wire, a 6 mm diameter by 10 cm length Viabahn stent was then deployed in the proximal left SFA and postdilated with a 5 mm diameter Lutonix drug-coated angioplasty balloon.  Completion imaging showed less than 10%  residual stenosis after intervention. I elected to terminate the procedure. The sheath was removed and StarClose closure device was deployed in the right femoral artery with excellent hemostatic result. The patient was taken to the recovery room in stable condition having tolerated the procedure well.  Findings:               Aortogram:  This demonstrated normal renal arteries and normal aorta and iliac segments without significant stenosis             Left lower Extremity:  Several areas of greater than 70% stenosis in the proximal to mid left SFA.  The mid to distal left SFA was fairly normal as was the popliteal artery.  There were two-vessel runoff through both the anterior tibial and posterior tibial arteries distally.  It should be noted that her circulation time was extremely slow suggestive of poor ejection fraction.   Disposition: Patient was taken to the recovery room in stable condition having tolerated the procedure well.  Complications: None  [facility] 09/29/2020 10:38 AM   This note was created with Dragon Medical transcription system. Any errors in dictation are purely unintentional.

## 2020-09-29 NOTE — Interval H&P Note (Signed)
History and Physical Interval Note:  09/29/2020 8:45 AM  [facility]  has presented today for surgery, with the diagnosis of LT lower extremity angio  BARD   ASO w claudication Covid May 10.  The various methods of treatment have been discussed with the patient and family. After consideration of risks, benefits and other options for treatment, the patient has consented to  Procedure(s): LOWER EXTREMITY ANGIOGRAPHY (Left) as a surgical intervention.  The patient's history has been reviewed, patient examined, no change in status, stable for surgery.  I have reviewed the patient's chart and labs.  Questions were answered to the patient's satisfaction.     Leotis Pain

## 2020-10-03 ENCOUNTER — Other Ambulatory Visit: Payer: Self-pay

## 2020-10-03 MED ORDER — ATORVASTATIN CALCIUM 40 MG PO TABS
40.0000 mg | ORAL_TABLET | Freq: Every day | ORAL | 1 refills | Status: DC
Start: 2020-10-03 — End: 2020-10-05

## 2020-10-04 ENCOUNTER — Other Ambulatory Visit: Payer: Medicare Other

## 2020-10-05 ENCOUNTER — Other Ambulatory Visit (INDEPENDENT_AMBULATORY_CARE_PROVIDER_SITE_OTHER): Payer: Self-pay | Admitting: Nurse Practitioner

## 2020-10-05 ENCOUNTER — Telehealth: Payer: Self-pay

## 2020-10-05 ENCOUNTER — Other Ambulatory Visit: Payer: Self-pay

## 2020-10-05 MED ORDER — ATORVASTATIN CALCIUM 40 MG PO TABS: 40.0000 mg | ORAL_TABLET | Freq: Every day | ORAL | 1 refills | Status: DC

## 2020-10-05 NOTE — Telephone Encounter (Signed)
Pharmacy called asking questions if pt was to take 2 tabs of Lipitor, I went thru several notes from previous visits and then spoke to lauren to make sure but pt is only supposed to be taking 1 tablet of Lipitor.  I sent a new rx to pharmacy and spoke to pharmacy and confirmed with them that pt only takes 1 tablet daily

## 2020-10-06 ENCOUNTER — Other Ambulatory Visit: Payer: Self-pay

## 2020-10-06 ENCOUNTER — Encounter
Admission: RE | Disposition: A | Discharge status: Home / Self Care | Payer: Self-pay | Source: Home / Self Care | Attending: Vascular Surgery

## 2020-10-06 ENCOUNTER — Telehealth (INDEPENDENT_AMBULATORY_CARE_PROVIDER_SITE_OTHER): Payer: Self-pay

## 2020-10-06 ENCOUNTER — Ambulatory Visit
Admission: RE | Admit: 2020-10-06 | Discharge: 2020-10-06 | Disposition: A | Discharge status: Home / Self Care | Payer: Medicare Other | Attending: Vascular Surgery | Admitting: Vascular Surgery

## 2020-10-06 ENCOUNTER — Encounter: Payer: Self-pay | Admitting: Vascular Surgery

## 2020-10-06 DIAGNOSIS — I70213 Atherosclerosis of native arteries of extremities with intermittent claudication, bilateral legs: Secondary | ICD-10-CM | POA: Diagnosis not present

## 2020-10-06 DIAGNOSIS — Z7982 Long term (current) use of aspirin: Secondary | ICD-10-CM | POA: Insufficient documentation

## 2020-10-06 DIAGNOSIS — F17210 Nicotine dependence, cigarettes, uncomplicated: Secondary | ICD-10-CM | POA: Diagnosis not present

## 2020-10-06 DIAGNOSIS — I10 Essential (primary) hypertension: Secondary | ICD-10-CM | POA: Insufficient documentation

## 2020-10-06 DIAGNOSIS — I70221 Atherosclerosis of native arteries of extremities with rest pain, right leg: Secondary | ICD-10-CM | POA: Insufficient documentation

## 2020-10-06 DIAGNOSIS — Z79899 Other long term (current) drug therapy: Secondary | ICD-10-CM | POA: Diagnosis not present

## 2020-10-06 DIAGNOSIS — I70219 Atherosclerosis of native arteries of extremities with intermittent claudication, unspecified extremity: Secondary | ICD-10-CM

## 2020-10-06 DIAGNOSIS — E785 Hyperlipidemia, unspecified: Secondary | ICD-10-CM | POA: Diagnosis not present

## 2020-10-06 HISTORY — PX: LOWER EXTREMITY ANGIOGRAPHY: CATH118251

## 2020-10-06 LAB — BUN: BUN: 18 mg/dL (ref 8–23)

## 2020-10-06 LAB — CREATININE, SERUM
Creatinine, Ser: 1.23 mg/dL — ABNORMAL HIGH (ref 0.44–1.00)
GFR, Estimated: 47 mL/min — ABNORMAL LOW (ref >60)

## 2020-10-06 SURGERY — LOWER EXTREMITY ANGIOGRAPHY
Anesthesia: Moderate Sedation | Site: Leg Lower | Laterality: Right

## 2020-10-06 MED ORDER — CEFAZOLIN SODIUM-DEXTROSE 2-4 GM/100ML-% IV SOLN
2.0000 g | Freq: Once | INTRAVENOUS | Status: AC
  Administered 2020-10-06: 2 g via INTRAVENOUS

## 2020-10-06 MED ORDER — FENTANYL CITRATE (PF) 100 MCG/2ML IJ SOLN
INTRAMUSCULAR | Status: AC
  Filled 2020-10-06: qty 2

## 2020-10-06 MED ORDER — DIPHENHYDRAMINE HCL 50 MG/ML IJ SOLN: 50.0000 mg | Freq: Once | INTRAMUSCULAR | Status: DC | PRN

## 2020-10-06 MED ORDER — IODIXANOL 320 MG/ML IV SOLN
INTRAVENOUS | Status: DC | PRN
  Administered 2020-10-06: 40 mL

## 2020-10-06 MED ORDER — MIDAZOLAM HCL 2 MG/2ML IJ SOLN
INTRAMUSCULAR | Status: AC
  Filled 2020-10-06: qty 4

## 2020-10-06 MED ORDER — HYDROMORPHONE HCL 1 MG/ML IJ SOLN: 1.0000 mg | Freq: Once | INTRAMUSCULAR | Status: DC | PRN

## 2020-10-06 MED ORDER — ONDANSETRON HCL 4 MG/2ML IJ SOLN: 4.0000 mg | Freq: Four times a day (QID) | INTRAMUSCULAR | Status: DC | PRN

## 2020-10-06 MED ORDER — FENTANYL CITRATE (PF) 100 MCG/2ML IJ SOLN
INTRAMUSCULAR | Status: DC | PRN
  Administered 2020-10-06 (×2): 50 ug via INTRAVENOUS

## 2020-10-06 MED ORDER — FAMOTIDINE 20 MG PO TABS: 40.0000 mg | ORAL_TABLET | Freq: Once | ORAL | Status: DC | PRN

## 2020-10-06 MED ORDER — MIDAZOLAM HCL 2 MG/2ML IJ SOLN
INTRAMUSCULAR | Status: DC | PRN
  Administered 2020-10-06 (×2): 2 mg via INTRAVENOUS

## 2020-10-06 MED ORDER — HEPARIN SODIUM (PORCINE) 1000 UNIT/ML IJ SOLN
INTRAMUSCULAR | Status: AC
  Filled 2020-10-06: qty 1

## 2020-10-06 MED ORDER — HEPARIN SODIUM (PORCINE) 1000 UNIT/ML IJ SOLN
INTRAMUSCULAR | Status: DC | PRN
  Administered 2020-10-06: 5000 [IU] via INTRAVENOUS

## 2020-10-06 MED ORDER — SODIUM CHLORIDE 0.9 % IV SOLN: INTRAVENOUS | Status: DC

## 2020-10-06 MED ORDER — MIDAZOLAM HCL 2 MG/ML PO SYRP: 8.0000 mg | ORAL_SOLUTION | Freq: Once | ORAL | Status: DC | PRN

## 2020-10-06 MED ORDER — CEFAZOLIN SODIUM-DEXTROSE 2-4 GM/100ML-% IV SOLN
INTRAVENOUS | Status: AC
  Filled 2020-10-06: qty 100

## 2020-10-06 MED ORDER — METHYLPREDNISOLONE SODIUM SUCC 125 MG IJ SOLR: 125.0000 mg | Freq: Once | INTRAMUSCULAR | Status: DC | PRN

## 2020-10-06 SURGICAL SUPPLY — 17 items
BALLN LUTONIX 018 5X300X130 (BALLOONS) ×4
BALLN ULTRVRSE 3X100X150 (BALLOONS) ×2
BALLOON LUTONIX 018 5X300X130 (BALLOONS) ×2 IMPLANT
BALLOON ULTRVRSE 3X100X150 (BALLOONS) ×1 IMPLANT
CATH TEMPO 5F RIM 65CM (CATHETERS) ×2 IMPLANT
CATH VERT 5X100 (CATHETERS) ×2 IMPLANT
COVER PROBE U/S 5X48 (MISCELLANEOUS) ×2 IMPLANT
DEVICE STARCLOSE SE CLOSURE (Vascular Products) ×2 IMPLANT
GLIDEWIRE ADV .035X260CM (WIRE) ×2 IMPLANT
KIT ENCORE 26 ADVANTAGE (KITS) ×2 IMPLANT
PACK ANGIOGRAPHY (CUSTOM PROCEDURE TRAY) ×2 IMPLANT
SHEATH ANL2 6FRX45 HC (SHEATH) ×2 IMPLANT
SHEATH BRITE TIP 5FRX11 (SHEATH) ×2 IMPLANT
STENT VIABAHN 6X100X120 (Permanent Stent) ×2 IMPLANT
STENT VIABAHN 6X250X120 (Permanent Stent) ×2 IMPLANT
WIRE G V18X300CM (WIRE) ×2 IMPLANT
WIRE GUIDERIGHT .035X150 (WIRE) ×2 IMPLANT

## 2020-10-06 NOTE — Op Note (Signed)
[facility]  Percutaneous Study/Intervention Procedural Note   Date of Surgery: 10/06/2020  Surgeon(s):Yeva Bissette    Assistants:none  Pre-operative Diagnosis: PAD with claudication BLE  Post-operative diagnosis:  Same  Procedure(s) Performed:             1.  Ultrasound guidance for vascular access left femoral artery             2.  Catheter placement into right common femoral artery from left femoral approach             3.  Selective right lower extremity angiogram             4.  Percutaneous transluminal angioplasty of right tibioperoneal trunk and proximal peroneal artery with 3 mm diameter by 10 cm length angioplasty balloon             5.   Percutaneous transluminal angioplasty of right SFA and was proximal popliteal artery with 5 mm diameter by 30 cm length Lutonix drug-coated angioplasty balloon  6.  Viabahn stent placement x2 to the right SFA and was proximal popliteal artery with a 6 mm diameter by 25 cm length and a 6 mm diameter by 10 cm length stent             7.  StarClose closure device left femoral artery  EBL: 10 cc  Contrast: 40 cc  Fluoro Time: 5.3 minutes  Moderate Conscious Sedation Time: approximately 44 minutes using 4 mg of Versed and 100 mcg of Fentanyl              Indications:  Patient is a 70 y.o.female with severe claudication symptoms in both lower extremities.  She has already undergone left lower extremity revascularization. The patient has noninvasive study showing right SFA and popliteal disease. The patient is brought in for angiography for further evaluation and potential treatment. Risks and benefits are discussed and informed consent is obtained.   Procedure:  The patient was identified and appropriate procedural time out was performed.  The patient was then placed supine on the table and prepped and draped in the usual sterile fashion. Moderate conscious sedation was administered during a face to face encounter with the  patient throughout the procedure with my supervision of the RN administering medicines and monitoring the patient's vital signs, pulse oximetry, telemetry and mental status throughout from the start of the procedure until the patient was taken to the recovery room. Ultrasound was used to evaluate the left common femoral artery.  It was patent .  A digital ultrasound image was acquired.  A Seldinger needle was used to access the left common femoral artery under direct ultrasound guidance and a permanent image was performed.  A 0.035 J wire was advanced without resistance and a 5Fr sheath was placed.   Aortogram was not performed as it was normal last week I then crossed the aortic bifurcation and advanced to the right femoral head. Selective right lower extremity angiogram was then performed. This demonstrated a diffusely diseased SFA and most proximal popliteal artery with multiple areas of greater than 70% stenosis throughout.  The profunda femoris and common femoral arteries are widely patent.  The popliteal artery in the mid and distal segment was widely patent.  There was a large anterior tibial artery that was continuous distally.  The tibioperoneal trunk had a greater than 95% stenosis with small peroneal and posterior tibial arteries below this proximal stenosis. It was felt that it was in the patient's  best interest to proceed with intervention after these images to avoid a second procedure and a larger amount of contrast and fluoroscopy based off of the findings from the initial angiogram. The patient was systemically heparinized and a 6 [place] Ansell sheath was then placed over the [employer] wire. I then used a Kumpe catheter and the advantage wire to cross the SFA and popliteal lesions.  Once I got down to the proximal tibioperoneal trunk, I used the Kumpe catheter and a V 18 wire to cross that lesion and confirm intraluminal flow in the peroneal artery.  I then replaced the V 18 wire and remove  the diagnostic catheter.  A 3 mm diameter by 10 cm length angioplasty balloon was inflated in the tibioperoneal trunk and proximal peroneal artery and inflated up to 10 atm for 1 minute.  Completion imaging showed less than 20% stenosis in the tibioperoneal trunk.  I then turned my attention to the SFA and proximal popliteal disease.  A 5 mm diameter by 30 cm length Lutonix drug-coated angioplasty balloon was inflated to 8 atm for 1 minute.  Completion imaging showed multiple areas of greater than 50% residual stenosis so I elected to place stents.  This required a 6 mm diameter by 25 cm length Viabahn stent and a 6 mm diameter by 10 cm length Viabahn stent with slight overlap to encompass the lesions.  These were postdilated with 5 mm balloon with excellent angiographic completion result and less than 10% residual stenosis. I elected to terminate the procedure. The sheath was removed and StarClose closure device was deployed in the left femoral artery with excellent hemostatic result. The patient was taken to the recovery room in stable condition having tolerated the procedure well.  Findings:                            Right Lower Extremity:  This demonstrated a diffusely diseased SFA and most proximal popliteal artery with multiple areas of greater than 70% stenosis throughout.  The profunda femoris and common femoral arteries are widely patent.  The popliteal artery in the mid and distal segment was widely patent.  There was a large anterior tibial artery that was continuous distally.  The tibioperoneal trunk had a greater than 95% stenosis with small peroneal and posterior tibial arteries below this proximal stenosis.   Disposition: Patient was taken to the recovery room in stable condition having tolerated the procedure well.  Complications: None  Leotis Pain 10/06/2020 9:13 AM   This note was created with Dragon Medical transcription system. Any errors in dictation are purely unintentional.

## 2020-10-06 NOTE — Telephone Encounter (Signed)
Tramadol...1 to 2 tabs 50mg  q6prn #[id number] -

## 2020-10-06 NOTE — Discharge Instructions (Signed)
Femoral Site Care  This sheet gives you information about how to care for yourself after your procedure. Your health care provider may also give you more specific instructions. If you have problems or questions, contact your health care provider. What can I expect after the procedure? After the procedure, it is common to have:  Bruising that usually fades within 1-2 weeks.  Tenderness at the site. Follow these instructions at home: Wound care  Follow instructions from your health care provider about how to take care of your insertion site. Make sure you: ? Wash your hands with soap and water before you change your bandage (dressing). If soap and water are not available, use hand sanitizer. ? Change your dressing as told by your health care provider. ? Leave stitches (sutures), skin glue, or adhesive strips in place. These skin closures may need to stay in place for 2 weeks or longer. If adhesive strip edges start to loosen and curl up, you may trim the loose edges. Do not remove adhesive strips completely unless your health care provider tells you to do that.  Do not take baths, swim, or use a hot tub until your health care provider approves.  You may shower 24-48 hours after the procedure or as told by your health care provider. ? Gently wash the site with plain soap and water. ? Pat the area dry with a clean towel. ? Do not rub the site. This may cause bleeding.  Do not apply powder or lotion to the site. Keep the site clean and dry.  Check your femoral site every day for signs of infection. Check for: ? Redness, swelling, or pain. ? Fluid or blood. ? Warmth. ? Pus or a bad smell. Activity  For the first 2-3 days after your procedure, or as long as directed: ? Avoid climbing stairs as much as possible. ? Do not squat.  Do not lift anything that is heavier than 10 lb (4.5 kg), or the limit that you are told, until your health care provider says that it is safe.  Rest as  directed. ? Avoid sitting for a long time without moving. Get up to take short walks every 1-2 hours.  Do not drive for 24 hours if you were given a medicine to help you relax (sedative). General instructions  Take over-the-counter and prescription medicines only as told by your health care provider.  Keep all follow-up visits as told by your health care provider. This is important. Contact a health care provider if you have:  A fever or chills.  You have redness, swelling, or pain around your insertion site. Get help right away if:  The catheter insertion area swells very fast.  You pass out.  You suddenly start to sweat or your skin gets clammy.  The catheter insertion area is bleeding, and the bleeding does not stop when you hold steady pressure on the area.  The area near or just beyond the catheter insertion site becomes pale, cool, tingly, or numb. These symptoms may represent a serious problem that is an emergency. Do not wait to see if the symptoms will go away. Get medical help right away. Call your local emergency services (911 in the [place] Do not drive yourself to the hospital. Summary  After the procedure, it is common to have bruising that usually fades within 1-2 weeks.  Check your femoral site every day for signs of infection.  Do not lift anything that is heavier than 10 lb (4.5 kg), or   the limit that you are told, until your health care provider says that it is safe. This information is not intended to replace advice given to you by your health care provider. Make sure you discuss any questions you have with your health care provider. Document Revised: 01/08/2020 Document Reviewed: 01/08/2020 Elsevier Patient Education  2021 [employer].  

## 2020-10-06 NOTE — Interval H&P Note (Signed)
History and Physical Interval Note:  10/06/2020 8:03 AM  Sarah Barnes  has presented today for surgery, with the diagnosis of RT LE angio  BARD   ASO w claudication Covid May 17.  The various methods of treatment have been discussed with the patient and family. After consideration of risks, benefits and other options for treatment, the patient has consented to  Procedure(s): LOWER EXTREMITY ANGIOGRAPHY (Right) as a surgical intervention.  The patient's history has been reviewed, patient examined, no change in status, stable for surgery.  I have reviewed the patient's chart and labs.  Questions were answered to the patient's satisfaction.     Leotis Pain

## 2020-10-06 NOTE — Telephone Encounter (Signed)
Patient has being made aware and prescription was left on [facility] voicemail.

## 2020-10-08 ENCOUNTER — Other Ambulatory Visit: Payer: Self-pay | Admitting: Physician Assistant

## 2020-10-08 DIAGNOSIS — I70201 Unspecified atherosclerosis of native arteries of extremities, right leg: Secondary | ICD-10-CM

## 2020-10-08 DIAGNOSIS — I739 Peripheral vascular disease, unspecified: Secondary | ICD-10-CM

## 2020-10-08 DIAGNOSIS — M79604 Pain in right leg: Secondary | ICD-10-CM

## 2020-10-11 NOTE — Progress Notes (Signed)
Carelink Summary Report / Loop Recorder 

## 2020-10-18 ENCOUNTER — Telehealth (INDEPENDENT_AMBULATORY_CARE_PROVIDER_SITE_OTHER): Payer: Self-pay

## 2020-10-18 NOTE — Telephone Encounter (Signed)
Bruising is not uncommon following this procedure.  Swelling and pain are also common.  For worrisome symptoms or if her foot feels cold or suddenly numb.  Also second dark discoloration of the toes.  Otherwise the patient is instructed to continue to elevate her lower extremity in addition to taking the pain medicine that was prescribed for her.

## 2020-10-19 NOTE — Telephone Encounter (Signed)
Refill for Tramadol has been called into [place] and patient has been made aware

## 2020-10-19 NOTE — Telephone Encounter (Signed)
Patient was made aware with the medical recommendations and informed that she is out of her pain medication. Can a refill be sent to pharmacy?

## 2020-10-19 NOTE — Telephone Encounter (Signed)
A one time refill.  We can reevaluate at her follow up visit

## 2020-10-21 DIAGNOSIS — H524 Presbyopia: Secondary | ICD-10-CM | POA: Diagnosis not present

## 2020-10-24 ENCOUNTER — Ambulatory Visit (INDEPENDENT_AMBULATORY_CARE_PROVIDER_SITE_OTHER): Payer: Medicare Other

## 2020-10-24 DIAGNOSIS — I639 Cerebral infarction, unspecified: Secondary | ICD-10-CM | POA: Diagnosis not present

## 2020-10-25 LAB — CUP PACEART REMOTE DEVICE CHECK
Date Time Interrogation Session: 20220606143554
Implantable Pulse Generator Implant Date: 20220330

## 2020-11-02 ENCOUNTER — Other Ambulatory Visit (INDEPENDENT_AMBULATORY_CARE_PROVIDER_SITE_OTHER): Payer: Self-pay | Admitting: Vascular Surgery

## 2020-11-02 DIAGNOSIS — I70213 Atherosclerosis of native arteries of extremities with intermittent claudication, bilateral legs: Secondary | ICD-10-CM

## 2020-11-02 DIAGNOSIS — Z95820 Peripheral vascular angioplasty status with implants and grafts: Secondary | ICD-10-CM

## 2020-11-03 ENCOUNTER — Ambulatory Visit (INDEPENDENT_AMBULATORY_CARE_PROVIDER_SITE_OTHER): Payer: Medicare Other | Admitting: Nurse Practitioner

## 2020-11-03 ENCOUNTER — Other Ambulatory Visit: Payer: Self-pay

## 2020-11-03 ENCOUNTER — Ambulatory Visit (INDEPENDENT_AMBULATORY_CARE_PROVIDER_SITE_OTHER): Payer: Medicare Other

## 2020-11-03 VITALS — BP 104/58 | HR 64 | Ht 63.0 in | Wt 167.0 lb

## 2020-11-03 DIAGNOSIS — Z720 Tobacco use: Secondary | ICD-10-CM

## 2020-11-03 DIAGNOSIS — I70213 Atherosclerosis of native arteries of extremities with intermittent claudication, bilateral legs: Secondary | ICD-10-CM

## 2020-11-03 DIAGNOSIS — I10 Essential (primary) hypertension: Secondary | ICD-10-CM

## 2020-11-03 DIAGNOSIS — E785 Hyperlipidemia, unspecified: Secondary | ICD-10-CM

## 2020-11-03 DIAGNOSIS — Z95820 Peripheral vascular angioplasty status with implants and grafts: Secondary | ICD-10-CM

## 2020-11-04 ENCOUNTER — Encounter: Payer: Self-pay | Admitting: Physician Assistant

## 2020-11-04 ENCOUNTER — Ambulatory Visit (INDEPENDENT_AMBULATORY_CARE_PROVIDER_SITE_OTHER): Payer: Medicare Other | Admitting: Physician Assistant

## 2020-11-04 DIAGNOSIS — R197 Diarrhea, unspecified: Secondary | ICD-10-CM

## 2020-11-04 DIAGNOSIS — Z20822 Contact with and (suspected) exposure to covid-19: Secondary | ICD-10-CM | POA: Diagnosis not present

## 2020-11-04 NOTE — Progress Notes (Signed)
[facility] [place], [place] [address]  Internal MEDICINE  Telephone Visit  Patient Name: Sarah Barnes  [id number]  [id number]  Date of Service: 11/06/2020  I connected with the patient at 12:17 by telephone and verified the patients identity using two identifiers.   I discussed the limitations, risks, security and privacy concerns of performing an evaluation and management service by telephone and the availability of in person appointments. I also discussed with the patient that there may be a patient responsible charge related to the service.  The patient expressed understanding and agrees to proceed.    Chief Complaint  Patient presents with   Acute Visit    Exposed to covid diarrhea x 2.5 weeks, stopped Tuesday, lost 9lbs, no appetite until Tuesday after diarrhea stopped, discuss meds to help quit smoking, neg. Covid test 2 days ago after pt stopped having symptoms    Telephone Screen    Phone call    Telephone Assessment    [phone]    HPI -Pt is here for acute virtual visit.  -Was experiencing severe diarrhea that required her to be in diapers for 2.5 weeks with no appetite and resulted in her losing 9lbs. reports that the symptoms stopped on Tuesday and patient once again has an appetite and is putting weight back on.  Patient is rehydrating and feeling much better.  Patient did not test for COVID during her acute diarrheal symptoms. -After improving on Tuesday, then had a covid exposure on Wednesday and tested Wednesday which was negative. Her nephew is symptomatic and was exposed with her and is being tested today. He lives with her and she is taking care of him. -Pt is boosted to covid and will retest if she begins to have symptoms.  Educated that she and her nephew should both quarantine for the time being until he receives his results/is no longer symptomatic  Current Medication: Outpatient Encounter Medications as of 11/04/2020  Medication  Sig   aspirin EC 81 MG EC tablet Take 1 tablet (81 mg total) by mouth daily.   atorvastatin (LIPITOR) 40 MG tablet Take 1 tablet (40 mg total) by mouth daily.   buPROPion (WELLBUTRIN XL) 150 MG 24 hr tablet TAKE 1 TABLET BY MOUTH  DAILY   busPIRone (BUSPAR) 15 MG tablet TAKE 1 TABLET BY MOUTH  TWICE DAILY   carvedilol (COREG) 12.5 MG tablet Take by mouth 2 (two) times daily.   celecoxib (CELEBREX) 200 MG capsule Take 1 capsule (200 mg total) by mouth daily.   cilostazol (PLETAL) 50 MG tablet TAKE 1 TABLET BY MOUTH  TWICE DAILY   clopidogrel (PLAVIX) 75 MG tablet Take 1 tablet (75 mg total) by mouth daily.   escitalopram (LEXAPRO) 10 MG tablet Take 1 tablet (10 mg total) by mouth daily.   fenofibrate micronized (LOFIBRA) 134 MG capsule Take 1 capsule (134 mg total) by mouth daily before breakfast. NEEDS APPOINTMENT FOR FUTURE REFILLS   folic acid (FOLVITE) 1 MG tablet Take 1 tablet (1 mg total) by mouth daily.   furosemide (LASIX) 40 MG tablet TAKE 1 TABLET BY MOUTH  DAILY   gabapentin (NEURONTIN) 100 MG capsule 2 in the morning, 1 in the afternoon and 2 at night.   meclizine (ANTIVERT) 25 MG tablet Take 1 tablet (25 mg total) by mouth 3 (three) times daily as needed for dizziness.   Multiple Vitamin (MULTIVITAMIN WITH MINERALS) TABS tablet Take 1 tablet by mouth daily.   nicotine (NICODERM CQ - DOSED  IN MG/24 HOURS) 21 mg/24hr patch Place 1 patch (21 mg total) onto the skin daily.   pantoprazole (PROTONIX) 40 MG tablet Take 1 tablet (40 mg total) by mouth daily. NEEDS APPOINTMENT FOR FUTURE REFILLS   Potassium 99 MG TABS Take by mouth.   tiZANidine (ZANAFLEX) 2 MG tablet TAKE 1 TABLET BY MOUTH  EVERY 8 HOURS AS NEEDED FOR MUSCLE SPASM(S)   traMADol (ULTRAM) 50 MG tablet Take 50-100 mg by mouth every 6 (six) hours as needed. for pain   traZODone (DESYREL) 100 MG tablet TAKE 1 TABLET BY MOUTH  DAILY AT BEDTIME   valsartan (DIOVAN) 160 MG tablet Take 1 tablet (160 mg total) by mouth daily.   No  facility-administered encounter medications on file as of 11/04/2020.    Surgical History: Past Surgical History:  Procedure Laterality Date   ANAL FISTULECTOMY  1970s X 3   BACK SURGERY     HERNIA REPAIR     KNEE ARTHROSCOPY Right    LAPAROSCOPIC CHOLECYSTECTOMY     LAPAROSCOPIC INCISIONAL / UMBILICAL / VENTRAL HERNIA REPAIR  08/13/2016   VHR w/mesh   LOWER EXTREMITY ANGIOGRAPHY Left 09/29/2020   Procedure: LOWER EXTREMITY ANGIOGRAPHY;  Surgeon: Algernon Huxley, MD;  Location: [place] [facility];  Service: Cardiovascular;  Laterality: Left;   LOWER EXTREMITY ANGIOGRAPHY Right 10/06/2020   Procedure: LOWER EXTREMITY ANGIOGRAPHY;  Surgeon: Algernon Huxley, MD;  Location: [place] [facility];  Service: Cardiovascular;  Laterality: Right;   LUMBAR DISC SURGERY     "herniated disc"   NASAL FRACTURE SURGERY  1970s X 2   RIGHT/LEFT HEART CATH AND CORONARY ANGIOGRAPHY N/A 12/27/2016   Procedure: RIGHT/LEFT HEART CATH AND CORONARY ANGIOGRAPHY;  Surgeon: Leonie Man, MD;  Location: [place] [facility]: Mild-moderate pulmonary hypertension.  Hyperdynamic ventricle noted.  EF 55-65% -hyperdynamic (unable to measure LVOT gradient).  Mildly elevated very tortuous but angiographically normal coronary arteries, suggesting hypertensive heart disease   SHOULDER ARTHROSCOPY WITH ROTATOR CUFF REPAIR Right 1990s?   TONSILLECTOMY  1951   TRANSTHORACIC ECHOCARDIOGRAM  12/2016    EF 60-65% with dynamic outflow tract obstruction at rest. Peak gradient 52 mmHg consistent with HOCM.  GRII DD area. Aortic sclerosis but no stenosis. Systolic anterior motion of mitral valve chordae. Mild mitral stenosis. Moderate LA dilation and mild RA dilation. Peak PA pressures 35 mmHg.   VENTRAL HERNIA REPAIR N/A 08/13/2016   Procedure: LAPAROSCOPIC VENTRAL HERNIA REPAIR WITH MESH;  Surgeon: Judeth Horn, MD;  Location: [place];  Service: General;  Laterality: N/A;    Medical History: Past Medical History:  Diagnosis Date    Agoraphobia with panic disorder    Anemia    Arthritis    "all over" (08/13/2016)   Chronic bronchitis (HCC)    COPD (chronic obstructive pulmonary disease) ([place])    Depression    Dyspnea    occ   Dysrhythmia    palpitations occ due to anxiety   ETOH abuse    GAD (generalized anxiety disorder)    GERD (gastroesophageal reflux disease)    Headache    "daily til I got glasses; now have headache once in awhile" (08/13/2016)   Hepatitis 1960   "isolated &  hospitalized for 2 weeks; don't know which kind of hepatitis"    Hernia, hiatal    High cholesterol    Hypertension    Hypertrophic obstructive cardiomyopathy ([place])    Mild aortic stenosis    Pneumonia    "once or twice" (08/13/2016)  PONV (postoperative nausea and vomiting)    Sciatica    Stroke [facility])     Family History: Family History  Problem Relation Age of Onset   Diabetes Mother    Hyperlipidemia Mother    Hypertension Mother    Heart attack Mother 10   Angina Mother        chronic problem   Hypertension Father    Stroke Father    Cancer Brother     Social History   Socioeconomic History   Marital status: Single    Spouse name: Not on file   Number of children: Not on file   Years of education: Not on file   Highest education level: Not on file  Occupational History   Not on file  Tobacco Use   Smoking status: Every Day    Packs/day: 1.50    Years: 49.00    Pack years: 73.50    Types: Cigarettes   Smokeless tobacco: Never  Substance and Sexual Activity   Alcohol use: Not Currently    Alcohol/week: 84.0 standard drinks    Types: 36 Cans of beer, 48 Shots of liquor per week    Comment: occ   Drug use: Not Currently    Frequency: 1.0 times per week    Types: Marijuana, Cocaine    Comment: Cocaine + March 2017   Sexual activity: Not Currently  Other Topics Concern   Not on file  Social History Narrative   Egan has generalized anxiety disorder and clearcut Agoraphobia.   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    She currently lives with her sister.   Currently denies substance abuse beyond Tobacco (1-1/2 PPD) & EtOH (beer, vodka) - not ready to discuss quitting.   Social Determinants of Health   Financial Resource Strain: Not on file  Food Insecurity: Not on file  Transportation Needs: Not on file  Physical Activity: Not on file  Stress: Not on file  Social Connections: Not on file  Intimate Partner Violence: Not on file      Review of Systems  Constitutional:  Negative for fatigue and fever.  HENT:  Negative for congestion, mouth sores and postnasal drip.   Respiratory:  Negative for cough and shortness of breath.   Cardiovascular:  Negative for chest pain.  Gastrointestinal:  Negative for abdominal pain, diarrhea, nausea and vomiting.  Genitourinary:  Negative for flank pain.  Psychiatric/Behavioral: Negative.     Vital Signs: BP (!) 104/58   Pulse 62   Temp 99 F (37.2 C)   Resp 16   Ht 5' 3.5" (1.613 m)   Wt 164 lb (74.4 kg)   BMI 28.60 kg/m    Observation/Objective:  Pt is able to carry out conversation   Assessment/Plan: 1. Close exposure to COVID-19 virus Pt is asymptomatic since exposure, but will continue to monitor. Tested negative on day of exposure, but lives with symptomatic nephew who is testing today,. Pt will update office if she becomes ill/tests positive.  2. Diarrhea, unspecified type Diarrhea improved prior to covid exposure. Pt is eating and hydrating again and feels much better.   General Counseling: At[place] verbalizes understanding of the findings of today's phone visit and agrees with plan of treatment. I have discussed any further diagnostic evaluation that may be needed or ordered today. We also reviewed her medications today. she has been encouraged  to call the office with any questions or concerns that should arise related to  todays visit.    No orders of the defined types were placed in this encounter.   No orders of the defined types were placed in this encounter.   Time spent:30 Minutes    Dr [name] Internal medicine

## 2020-11-07 ENCOUNTER — Other Ambulatory Visit: Payer: Self-pay

## 2020-11-07 DIAGNOSIS — M064 Inflammatory polyarthropathy: Secondary | ICD-10-CM

## 2020-11-07 MED ORDER — CELECOXIB 200 MG PO CAPS: 200.0000 mg | ORAL_CAPSULE | Freq: Every day | ORAL | 1 refills | Status: DC

## 2020-11-07 MED ORDER — PANTOPRAZOLE SODIUM 40 MG PO TBEC: 40.0000 mg | DELAYED_RELEASE_TABLET | Freq: Every day | ORAL | 1 refills | Status: DC

## 2020-11-13 ENCOUNTER — Encounter (INDEPENDENT_AMBULATORY_CARE_PROVIDER_SITE_OTHER): Payer: Self-pay | Admitting: Nurse Practitioner

## 2020-11-13 NOTE — Progress Notes (Signed)
Subjective:    Patient ID: Sarah Barnes, female    DOB: 16-Jul-1950, 70 y.o.   MRN: [id number] Chief Complaint  Patient presents with   Follow-up    5 wk armc  post LE angio abi     The patient returns to the office for followup and review status post angiogram with intervention. The patient notes improvement in the lower extremity symptoms. No interval shortening of the patient's claudication distance or rest pain symptoms.   No new ulcers or wounds have occurred since the last visit.  The patient underwent intervention on her left lower extremity on 09/29/2020 and the right on 10/06/2020.  There have been no significant changes to the patient's overall health care.  The patient denies amaurosis fugax or recent TIA symptoms. There are no recent neurological changes noted. The patient denies history of DVT, PE or superficial thrombophlebitis. The patient denies recent episodes of angina or shortness of breath.   ABI's Rt=0.92 and Lt=0.93  (no previous ABIs) Duplex US of the right lower extremity reveals triphasic/biphasic waveforms with triphasic waveforms on the left.  The patient has good toe waveforms bilaterally   Review of Systems     Objective:   Physical Exam  BP (!) 104/58   Pulse 64   Ht 5\' 3"  (1.6 m)   Wt 167 lb (75.8 kg)   BMI 29.58 kg/m   Past Medical History:  Diagnosis Date   Agoraphobia with panic disorder    Anemia    Arthritis    "all over" (08/13/2016)   Chronic bronchitis (HCC)    COPD (chronic obstructive pulmonary disease) (HCC)    Depression    Dyspnea    occ   Dysrhythmia    palpitations occ due to anxiety   ETOH abuse    GAD (generalized anxiety disorder)    GERD (gastroesophageal reflux disease)    Headache    "daily til I got glasses; now have headache once in awhile" (08/13/2016)   Hepatitis 1960   "isolated &  hospitalized for 2 weeks; don't know which kind of hepatitis"    Hernia, hiatal    High cholesterol    Hypertension     Hypertrophic obstructive cardiomyopathy ([place])    Mild aortic stenosis    Pneumonia    "once or twice" (08/13/2016)   PONV (postoperative nausea and vomiting)    Sciatica    Stroke [facility])     Social History   Socioeconomic History   Marital status: Single    Spouse name: Not on file   Number of children: Not on file   Years of education: Not on file   Highest education level: Not on file  Occupational History   Not on file  Tobacco Use   Smoking status: Every Day    Packs/day: 1.50    Years: 49.00    Pack years: 73.50    Types: Cigarettes   Smokeless tobacco: Never  Substance and Sexual Activity   Alcohol use: Not Currently    Alcohol/week: 84.0 standard drinks    Types: 36 Cans of beer, 48 Shots of liquor per week    Comment: occ   Drug use: Not Currently    Frequency: 1.0 times per week    Types: Marijuana, Cocaine    Comment: Cocaine + March 2017   Sexual activity: Not Currently  Other Topics Concern   Not on file  Social History Narrative   Florance has generalized anxiety disorder and clearcut Agoraphobia.  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    She currently lives with her sister.   Currently denies substance abuse beyond Tobacco (1-1/2 PPD) & EtOH (beer, vodka) - not ready to discuss quitting.   Social Determinants of Health   Financial Resource Strain: Not on file  Food Insecurity: Not on file  Transportation Needs: Not on file  Physical Activity: Not on file  Stress: Not on file  Social Connections: Not on file  Intimate Partner Violence: Not on file    Past Surgical History:  Procedure Laterality Date   ANAL FISTULECTOMY  1970s X 3   BACK SURGERY     HERNIA REPAIR     KNEE ARTHROSCOPY Right    LAPAROSCOPIC CHOLECYSTECTOMY     LAPAROSCOPIC INCISIONAL / UMBILICAL / [place]  08/13/2016   [place] w/mesh   LOWER EXTREMITY ANGIOGRAPHY Left  09/29/2020   Procedure: LOWER EXTREMITY ANGIOGRAPHY;  Surgeon: Algernon Huxley, MD;  Location: [place] [facility];  Service: Cardiovascular;  Laterality: Left;   LOWER EXTREMITY ANGIOGRAPHY Right 10/06/2020   Procedure: LOWER EXTREMITY ANGIOGRAPHY;  Surgeon: Algernon Huxley, MD;  Location: [place] [facility];  Service: Cardiovascular;  Laterality: Right;   LUMBAR DISC SURGERY     "herniated disc"   NASAL FRACTURE SURGERY  1970s X 2   RIGHT/LEFT HEART CATH AND CORONARY ANGIOGRAPHY N/A 12/27/2016   Procedure: RIGHT/LEFT HEART CATH AND CORONARY ANGIOGRAPHY;  Surgeon: Leonie Man, MD;  Location: [place] [facility]: Mild-moderate pulmonary hypertension.  Hyperdynamic ventricle noted.  EF 55-65% -hyperdynamic (unable to measure LVOT gradient).  Mildly elevated very tortuous but angiographically normal coronary arteries, suggesting hypertensive heart disease   SHOULDER ARTHROSCOPY WITH ROTATOR CUFF REPAIR Right 1990s?   TONSILLECTOMY  1951   TRANSTHORACIC ECHOCARDIOGRAM  12/2016    EF 60-65% with dynamic outflow tract obstruction at rest. Peak gradient 52 mmHg consistent with HOCM.  GRII DD area. Aortic sclerosis but no stenosis. Systolic anterior motion of mitral valve chordae. Mild mitral stenosis. Moderate LA dilation and mild RA dilation. Peak PA pressures 35 mmHg.   VENTRAL HERNIA REPAIR N/A 08/13/2016   Procedure: LAPAROSCOPIC VENTRAL HERNIA REPAIR WITH MESH;  Surgeon: Judeth Horn, MD;  Location: [place];  Service: General;  Laterality: N/A;    Family History  Problem Relation Age of Onset   Diabetes Mother    Hyperlipidemia Mother    Hypertension Mother    Heart attack Mother 60   Angina Mother        chronic problem   Hypertension Father    Stroke Father    Cancer Brother     No Known Allergies  CBC Latest Ref Rng & Units 07/06/2020 07/04/2020 11/20/2019  WBC 4.0 - 10.5 K/uL 11.6(H) 8.9 9.6  Hemoglobin 12.0 - 15.0 g/dL 13.6 14.3 14.0  Hematocrit 36.0 - 46.0 % 38.7 41.8 40.1  Platelets  150 - 400 K/uL 272 282 310      CMP     Component Value Date/Time   NA 136 07/06/2020 2141   NA 141 11/20/2019 1317   K 3.9 07/06/2020 2141   CL 98 07/06/2020 2141   CO2 27 07/06/2020 2141   GLUCOSE 107 (H) 07/06/2020 2141   BUN 18 10/06/2020 0711   BUN 33 (H) 11/20/2019 1317   CREATININE 1.23 (H) 10/06/2020 0711   CALCIUM 9.2 07/06/2020 2141  PROT 7.3 07/04/2020 1119   PROT 7.3 11/20/2019 1317   ALBUMIN 4.5 07/04/2020 1119   ALBUMIN 4.7 11/20/2019 1317   AST 19 07/04/2020 1119   ALT 14 07/04/2020 1119   ALKPHOS 40 07/04/2020 1119   BILITOT 0.7 07/04/2020 1119   BILITOT 0.6 11/20/2019 1317   GFRNONAA 47 (L) 10/06/2020 0711   GFRAA 35 (L) 11/20/2019 1317     VAS [place] ABI WITH/WO TBI  Result Date: 11/11/2020  LOWER EXTREMITY DOPPLER STUDY Patient Name:  JOCLYNN LUMB  Date of Exam:   11/03/2020 Medical Rec #: [id number]          Accession #:    [id number] Date of Birth: [birth year]           Patient Gender: F Patient Age:   49Y Exam Location:  [facility] Procedure:      VAS [place] ABI WITH/WO TBI Referring Phys: [id number] [place] --------------------------------------------------------------------------------  Indications: S/P Bilateral Angiolasty with stents.  Vascular Interventions: 09/29/2020: Aortogram and Selective Left Lower Extremity                         Angiogram. PTA of the Left Proximal to Mid SFA with 45mm                         diameter by 22cm length Lutonix drug -coated angioplasty                         balloon. Stent placement to the Left SFA with 87mm                         diameter by 10 cm length Viabhan stent.                          10/06/2020: Selective Right Lower Extremity Angiogram.                         PTA of the Right Tibioperoneal Trunk and Proximal                         Peroneal Artery with 75mm diameter by 10cm length                         angioplasty balloon. PTAof the Right SFA and Proximal                         Popliteal Artery  with 66mm diameter by 30cm length                         Lutonix drug coated angioplasty balloon. Viabahn stent                         placement x2 to the Right SFA and Proximal Popliteal                         Artery with a 57mm diameter by 25 cm length and a 74mm                         diameter by 10 cm length  stent. Performing Technologist: Almira Coaster RVS  Examination Guidelines: A complete evaluation includes at minimum, Doppler waveform signals and systolic blood pressure reading at the level of bilateral brachial, anterior tibial, and posterior tibial arteries, when vessel segments are accessible. Bilateral testing is considered an integral part of a complete examination. Photoelectric Plethysmograph (PPG) waveforms and toe systolic pressure readings are included as required and additional duplex testing as needed. Limited examinations for reoccurring indications may be performed as noted.  ABI Findings: +---------+------------------+-----+---------+--------+ Right    Rt Pressure (mmHg)IndexWaveform Comment  +---------+------------------+-----+---------+--------+ Brachial 108                                      +---------+------------------+-----+---------+--------+ ATA      94                0.87 triphasic         +---------+------------------+-----+---------+--------+ PTA      99                0.92 biphasic          +---------+------------------+-----+---------+--------+ Great Toe80                0.74 Normal            +---------+------------------+-----+---------+--------+ +---------+------------------+-----+---------+-------+ Left     Lt Pressure (mmHg)IndexWaveform Comment +---------+------------------+-----+---------+-------+ Brachial 105                                     +---------+------------------+-----+---------+-------+ ATA      95                0.88 triphasic        +---------+------------------+-----+---------+-------+ PTA      100                0.93 triphasic        +---------+------------------+-----+---------+-------+ Great Toe90                0.83 Normal           +---------+------------------+-----+---------+-------+ +-------+-----------+-----------+------------+------------+ ABI/TBIToday's ABIToday's TBIPrevious ABIPrevious TBI +-------+-----------+-----------+------------+------------+ Right  .92        .74                                 +-------+-----------+-----------+------------+------------+ Left   .93        .83                                 +-------+-----------+-----------+------------+------------+  Summary: Right: Resting right ankle-brachial index indicates mild right lower extremity arterial disease. The right toe-brachial index is normal. Left: Resting left ankle-brachial index indicates mild left lower extremity arterial disease. The left toe-brachial index is normal.  *See table(s) above for measurements and observations.  Electronically signed by Leotis Pain MD on 11/11/2020 at 11:47:04 AM.    Final        Assessment & Plan:   1. Atherosclerosis of native artery of both lower extremities with intermittent claudication (HCC)  Recommend:  The patient has evidence of atherosclerosis of the lower extremities with claudication.  The patient does not voice lifestyle limiting changes at this point in time.  Noninvasive studies do not suggest clinically significant change.  No invasive  studies, angiography or surgery at this time The patient should continue walking and begin a more formal exercise program.  The patient should continue antiplatelet therapy and aggressive treatment of the lipid abnormalities  No changes in the patient's medications at this time  The patient should continue wearing graduated compression socks 10-15 mmHg strength to control the mild edema.    2. Hyperlipidemia, unspecified hyperlipidemia type Continue statin as ordered and reviewed, no changes at this  time   3. Tobacco abuse Smoking cessation was discussed, 3-10 minutes spent on this topic specifically   4. Essential hypertension Continue antihypertensive medications as already ordered, these medications have been reviewed and there are no changes at this time.    Current Outpatient Medications on File Prior to Visit  Medication Sig Dispense Refill   aspirin EC 81 MG EC tablet Take 1 tablet (81 mg total) by mouth daily. 30 tablet 1   atorvastatin (LIPITOR) 40 MG tablet Take 1 tablet (40 mg total) by mouth daily. 90 tablet 1   buPROPion (WELLBUTRIN XL) 150 MG 24 hr tablet TAKE 1 TABLET BY MOUTH  DAILY 90 tablet 3   busPIRone (BUSPAR) 15 MG tablet TAKE 1 TABLET BY MOUTH  TWICE DAILY 180 tablet 0   carvedilol (COREG) 12.5 MG tablet Take by mouth 2 (two) times daily.     cilostazol (PLETAL) 50 MG tablet TAKE 1 TABLET BY MOUTH  TWICE DAILY 180 tablet 3   clopidogrel (PLAVIX) 75 MG tablet Take 1 tablet (75 mg total) by mouth daily. 30 tablet 11   escitalopram (LEXAPRO) 10 MG tablet Take 1 tablet (10 mg total) by mouth daily. 90 tablet 1   fenofibrate micronized (LOFIBRA) 134 MG capsule Take 1 capsule (134 mg total) by mouth daily before breakfast. NEEDS APPOINTMENT FOR FUTURE REFILLS 15 capsule 0   folic acid (FOLVITE) 1 MG tablet Take 1 tablet (1 mg total) by mouth daily. 30 tablet 0   furosemide (LASIX) 40 MG tablet TAKE 1 TABLET BY MOUTH  DAILY 90 tablet 3   gabapentin (NEURONTIN) 100 MG capsule 2 in the morning, 1 in the afternoon and 2 at night. 450 capsule 0   meclizine (ANTIVERT) 25 MG tablet Take 1 tablet (25 mg total) by mouth 3 (three) times daily as needed for dizziness. 30 tablet 0   Multiple Vitamin (MULTIVITAMIN WITH MINERALS) TABS tablet Take 1 tablet by mouth daily.     nicotine (NICODERM CQ - DOSED IN MG/24 HOURS) 21 mg/24hr patch Place 1 patch (21 mg total) onto the skin daily. 28 patch 0   Potassium 99 MG TABS Take by mouth.     tiZANidine (ZANAFLEX) 2 MG tablet TAKE 1  TABLET BY MOUTH  EVERY 8 HOURS AS NEEDED FOR MUSCLE SPASM(S) 270 tablet 1   traMADol (ULTRAM) 50 MG tablet Take 50-100 mg by mouth every 6 (six) hours as needed. for pain     traZODone (DESYREL) 100 MG tablet TAKE 1 TABLET BY MOUTH  DAILY AT BEDTIME 90 tablet 3   valsartan (DIOVAN) 160 MG tablet Take 1 tablet (160 mg total) by mouth daily. 90 tablet 0   No current facility-administered medications on file prior to visit.    There are no Patient Instructions on file for this visit. No follow-ups on file.   Kris Hartmann, NP

## 2020-11-14 NOTE — Progress Notes (Signed)
Carelink Summary Report / Loop Recorder 

## 2020-11-21 ENCOUNTER — Other Ambulatory Visit: Payer: Self-pay | Admitting: Internal Medicine

## 2020-11-22 ENCOUNTER — Other Ambulatory Visit: Payer: Self-pay | Admitting: Internal Medicine

## 2020-11-24 ENCOUNTER — Ambulatory Visit (INDEPENDENT_AMBULATORY_CARE_PROVIDER_SITE_OTHER): Payer: Medicare Other

## 2020-11-24 DIAGNOSIS — I639 Cerebral infarction, unspecified: Secondary | ICD-10-CM | POA: Diagnosis not present

## 2020-11-27 LAB — CUP PACEART REMOTE DEVICE CHECK
Date Time Interrogation Session: 20220709143451
Implantable Pulse Generator Implant Date: 20220330

## 2020-11-30 ENCOUNTER — Telehealth: Payer: Self-pay

## 2020-11-30 NOTE — Telephone Encounter (Signed)
-----   Message from Vickie Epley, MD sent at 11/30/2020 11:27 AM EDT ----- Linq reviewed. New atrial fibrillation (Episode 77 is clearest strip of the AF). Refer to AF clinic for [facility] initiation.  Lysbeth Galas T. Quentin Ore, MD, [facility] Cardiac Electrophysiology

## 2020-11-30 NOTE — Telephone Encounter (Signed)
Outreach made to Pt.  Advised that afib had been found on her loop monitor and would like Pt to be seen to discuss diagnosis and treatment.  Pt scheduled to see CB on December 09, 2020 at 3:10 pm at [facility].  Pt indicates understanding.

## 2020-11-30 NOTE — Telephone Encounter (Signed)
Pt with loop recorder placed for cryptogenic stroke.  Loop recorder with evidence of afib per Dr. Quentin Ore.  Will schedule first available with APP in [place].

## 2020-12-02 ENCOUNTER — Other Ambulatory Visit: Payer: Self-pay

## 2020-12-02 ENCOUNTER — Other Ambulatory Visit: Payer: Self-pay | Admitting: Physician Assistant

## 2020-12-02 DIAGNOSIS — M064 Inflammatory polyarthropathy: Secondary | ICD-10-CM

## 2020-12-02 DIAGNOSIS — F411 Generalized anxiety disorder: Secondary | ICD-10-CM

## 2020-12-02 MED ORDER — ATORVASTATIN CALCIUM 40 MG PO TABS: 40.0000 mg | ORAL_TABLET | Freq: Every day | ORAL | 1 refills | Status: DC

## 2020-12-05 ENCOUNTER — Ambulatory Visit (INDEPENDENT_AMBULATORY_CARE_PROVIDER_SITE_OTHER): Payer: Medicare Other | Admitting: Physician Assistant

## 2020-12-05 ENCOUNTER — Encounter: Payer: Self-pay | Admitting: Physician Assistant

## 2020-12-05 ENCOUNTER — Other Ambulatory Visit: Payer: Self-pay

## 2020-12-05 DIAGNOSIS — I70213 Atherosclerosis of native arteries of extremities with intermittent claudication, bilateral legs: Secondary | ICD-10-CM

## 2020-12-05 DIAGNOSIS — G4710 Hypersomnia, unspecified: Secondary | ICD-10-CM | POA: Diagnosis not present

## 2020-12-05 DIAGNOSIS — R5383 Other fatigue: Secondary | ICD-10-CM | POA: Diagnosis not present

## 2020-12-05 DIAGNOSIS — F17218 Nicotine dependence, cigarettes, with other nicotine-induced disorders: Secondary | ICD-10-CM

## 2020-12-05 DIAGNOSIS — I10 Essential (primary) hypertension: Secondary | ICD-10-CM

## 2020-12-05 DIAGNOSIS — I4891 Unspecified atrial fibrillation: Secondary | ICD-10-CM | POA: Diagnosis not present

## 2020-12-05 DIAGNOSIS — F411 Generalized anxiety disorder: Secondary | ICD-10-CM

## 2020-12-05 NOTE — Progress Notes (Signed)
[facility] [place], [place] [address]  Internal MEDICINE  Office Visit Note  Patient Name: Sarah Barnes  [id number]  [id number]  Date of Service: 12/11/2020  Chief Complaint  Patient presents with   Follow-up    Dizziness, pain in  both calf, Fatigue, SOB light headed     HPI Pt is here for routine follow up. -Was told tat they saw afib on her loop recorder monitor and she follows up with cardiology on Friday. Has been light headed. Had covid back in June, but has mostly recovered.  -Gets very sleepy. Goes to bed at 7:30pm and gets up at 4:30am. Gets up to restroom in the middle of the night.  Unaware if she is snoring. -Sleeps on her side. Sometimes gets SOB when she first lays down on her side. No trouble laying flat on her back. -Due to hx of stroke, new found afib and sleepiness, will order PSG for further evaluation -Still smoking, and states she thinks the medication may have made it worse. Will stop bupropion and have her double her Lexapro since she never tried that before as recommended. -Did have stents placed in both legs in May and reports she still has some leg pain--followed by vascular -Due for routine labs EPWORTH SLEEPINESS SCALE:  Scale:  (0)= no chance of dozing; (1)= slight chance of dozing; (2)= moderate chance of dozing; (3)= high chance of dozing  Chance  Situtation    Sitting and reading: 0    Watching TV: 3    Sitting Inactive in public: 0    As a passenger in car: 3      Lying down to rest: 2    Sitting and talking: 0    Sitting quielty after lunch: 0    In a car, stopped in traffic: 0   TOTAL SCORE:   8 out of 24   Current Medication: Outpatient Encounter Medications as of 12/05/2020  Medication Sig   atorvastatin (LIPITOR) 40 MG tablet Take 1 tablet (40 mg total) by mouth daily.   buPROPion (WELLBUTRIN XL) 150 MG 24 hr tablet TAKE 1 TABLET BY MOUTH  DAILY   busPIRone (BUSPAR) 15 MG tablet TAKE 1 TABLET  BY MOUTH  TWICE DAILY   carvedilol (COREG) 12.5 MG tablet Take by mouth 2 (two) times daily.   celecoxib (CELEBREX) 200 MG capsule Take 1 capsule (200 mg total) by mouth daily.   cilostazol (PLETAL) 50 MG tablet TAKE 1 TABLET BY MOUTH  TWICE DAILY   clopidogrel (PLAVIX) 75 MG tablet Take 1 tablet (75 mg total) by mouth daily.   escitalopram (LEXAPRO) 10 MG tablet Take 1 tablet (10 mg total) by mouth daily.   fenofibrate micronized (LOFIBRA) 134 MG capsule Take 1 capsule (134 mg total) by mouth daily before breakfast. NEEDS APPOINTMENT FOR FUTURE REFILLS   folic acid (FOLVITE) 1 MG tablet Take 1 tablet (1 mg total) by mouth daily.   furosemide (LASIX) 40 MG tablet TAKE 1 TABLET BY MOUTH  DAILY   gabapentin (NEURONTIN) 100 MG capsule TAKE 2 CAPSULES BY MOUTH IN THE MORNING, 1 CAPSULE IN  THE AFTERNOON AND 2  CAPSULES AT NIGHT   meclizine (ANTIVERT) 25 MG tablet Take 1 tablet (25 mg total) by mouth 3 (three) times daily as needed for dizziness.   Multiple Vitamin (MULTIVITAMIN WITH MINERALS) TABS tablet Take 1 tablet by mouth daily.   nicotine (NICODERM CQ - DOSED IN MG/24 HOURS) 21 mg/24hr patch Place 1 patch (  21 mg total) onto the skin daily.   pantoprazole (PROTONIX) 40 MG tablet Take 1 tablet (40 mg total) by mouth daily. NEEDS APPOINTMENT FOR FUTURE REFILLS   Potassium 99 MG TABS Take by mouth.   tiZANidine (ZANAFLEX) 2 MG tablet TAKE 1 TABLET BY MOUTH  EVERY 8 HOURS AS NEEDED FOR MUSCLE SPASM(S)   traMADol (ULTRAM) 50 MG tablet Take 50-100 mg by mouth every 6 (six) hours as needed. for pain   traZODone (DESYREL) 100 MG tablet TAKE 1 TABLET BY MOUTH  DAILY AT BEDTIME   valsartan (DIOVAN) 160 MG tablet TAKE 1 TABLET BY MOUTH  DAILY   [DISCONTINUED] aspirin EC 81 MG EC tablet Take 1 tablet (81 mg total) by mouth daily.   No facility-administered encounter medications on file as of 12/05/2020.    Surgical History: Past Surgical History:  Procedure Laterality Date   ANAL FISTULECTOMY  1970s X  3   BACK SURGERY     HERNIA REPAIR     KNEE ARTHROSCOPY Right    LAPAROSCOPIC CHOLECYSTECTOMY     LAPAROSCOPIC INCISIONAL / UMBILICAL / VENTRAL HERNIA REPAIR  08/13/2016   VHR w/mesh   LOWER EXTREMITY ANGIOGRAPHY Left 09/29/2020   Procedure: LOWER EXTREMITY ANGIOGRAPHY;  Surgeon: Algernon Huxley, MD;  Location: [facility];  Service: Cardiovascular;  Laterality: Left;   LOWER EXTREMITY ANGIOGRAPHY Right 10/06/2020   Procedure: LOWER EXTREMITY ANGIOGRAPHY;  Surgeon: Algernon Huxley, MD;  Location: [facility];  Service: Cardiovascular;  Laterality: Right;   LUMBAR DISC SURGERY     "herniated disc"   NASAL FRACTURE SURGERY  1970s X 2   RIGHT/LEFT HEART CATH AND CORONARY ANGIOGRAPHY N/A 12/27/2016   Procedure: RIGHT/LEFT HEART CATH AND CORONARY ANGIOGRAPHY;  Surgeon: Leonie Man, MD;  Location: [facility]: Mild-moderate pulmonary hypertension.  Hyperdynamic ventricle noted.  EF 55-65% -hyperdynamic (unable to measure LVOT gradient).  Mildly elevated very tortuous but angiographically normal coronary arteries, suggesting hypertensive heart disease   SHOULDER ARTHROSCOPY WITH ROTATOR CUFF REPAIR Right 1990s?   TONSILLECTOMY  1951   TRANSTHORACIC ECHOCARDIOGRAM  12/2016    EF 60-65% with dynamic outflow tract obstruction at rest. Peak gradient 52 mmHg consistent with HOCM.  GRII DD area. Aortic sclerosis but no stenosis. Systolic anterior motion of mitral valve chordae. Mild mitral stenosis. Moderate LA dilation and mild RA dilation. Peak PA pressures 35 mmHg.   VENTRAL HERNIA REPAIR N/A 08/13/2016   Procedure: LAPAROSCOPIC VENTRAL HERNIA REPAIR WITH MESH;  Surgeon: Judeth Horn, MD;  Location: [facility];  Service: General;  Laterality: N/A;    Medical History: Past Medical History:  Diagnosis Date   Agoraphobia with panic disorder    Anemia    Arthritis    "all over" (08/13/2016)   Chronic bronchitis ([place])    COPD (chronic obstructive pulmonary disease) ([place])    Cryptogenic  stroke ([place])    a. 06/2020 Head CT: low density caudate nucleus concerning for infarct; b. 07/2020 s/p MDT Linq; c. 11/2020 Finding of Afib on Linq.   Depression    Dyspnea    occ   Dysrhythmia    palpitations occ due to anxiety   ETOH abuse    GAD (generalized anxiety disorder)    GERD (gastroesophageal reflux disease)    Headache    "daily til I got glasses; now have headache once in awhile" (08/13/2016)   Hepatitis 1960   "isolated &  hospitalized for 2 weeks; don't know which kind of hepatitis"  Hernia, hiatal    High cholesterol    Hypertension    Hypertrophic obstructive cardiomyopathy (HCC)    Mild aortic stenosis    a. 01/2019 Echo: EF 55-60%. Mild AS; b. 12/2019 Echo: Nl EF. Mild diast dysfxn. Trace AS/AI/TR. Mild MR.   Nonobstructive CAD (coronary artery disease)    a. 12/2016 Cath: LM nl, LAD min irregs, LCX nl, OM1/2 min irregs, RCA nl, RPDA min irregs. EF 55-65%.   PAD (peripheral artery disease) ([place])    a. 09/29/2020 s/p PTA/DBA to L prox/mid SFA and stenting of L SFA; b. 10/06/2020 PTA of R TP trunk and prox peroenal. PTA/DBA of R SFA and prox R Popliteal. R SFA stenting x 2; c. 10/2020 ABI: R 0.92, L 0.93.   PAF (paroxysmal atrial fibrillation) ([place])    a.11/2020 PAF noted on Linq; b. CHA2DS2VASc = 6-->eliquis.   Pneumonia    "once or twice" (08/13/2016)   PONV (postoperative nausea and vomiting)    Sciatica    Tobacco abuse     Family History: Family History  Problem Relation Age of Onset   Diabetes Mother    Hyperlipidemia Mother    Hypertension Mother    Heart attack Mother 72   Angina Mother        chronic problem   Hypertension Father    Stroke Father    Cancer Brother     Social History   Socioeconomic History   Marital status: Single    Spouse name: Not on file   Number of children: Not on file   Years of education: Not on file   Highest education level: Not on file  Occupational History   Not on file  Tobacco Use   Smoking status: Every Day     Packs/day: 1.50    Years: 49.00    Pack years: 73.50    Types: Cigarettes   Smokeless tobacco: Never  Substance and Sexual Activity   Alcohol use: Not Currently    Alcohol/week: 84.0 standard drinks    Types: 36 Cans of beer, 48 Shots of liquor per week    Comment: occ   Drug use: Not Currently    Frequency: 1.0 times per week    Types: Marijuana, Cocaine    Comment: Cocaine + March 2017   Sexual activity: Not Currently  Other Topics Concern   Not on file  Social History Narrative   Bell[place]ia has generalized anxiety disorder and clearcut Agoraphobia.   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    She currently lives with her sister.   Currently denies substance abuse beyond Tobacco (1-1/2 PPD) & EtOH (beer, vodka) - not ready to discuss quitting.   Social Determinants of Health   Financial Resource Strain: Not on file  Food Insecurity: Not on file  Transportation Needs: Not on file  Physical Activity: Not on file  Stress: Not on file  Social Connections: Not on file  Intimate Partner Violence: Not on file      Review of Systems  Constitutional:  Positive for fatigue. Negative for chills and unexpected weight change.  HENT:  Negative for congestion, postnasal drip, rhinorrhea, sneezing and sore throat.   Eyes:  Negative for redness.  Respiratory:  Positive for shortness of breath. Negative for cough, chest tightness and wheezing.   Cardiovascular:  Negative for chest pain, palpitations and leg swelling.  Gastrointestinal:  Negative for abdominal pain, constipation, diarrhea, nausea and vomiting.  Genitourinary:  Negative for dysuria and frequency.  Musculoskeletal:  Positive for arthralgias and myalgias. Negative for back pain, joint swelling and neck pain.  Skin:  Negative for rash.  Neurological:  Positive for light-headedness. Negative for tremors, syncope and numbness.   Hematological:  Negative for adenopathy. Does not bruise/bleed easily.  Psychiatric/Behavioral:  Negative for behavioral problems (Depression), sleep disturbance and suicidal ideas. The patient is nervous/anxious.    Vital Signs: BP 138/76   Pulse (!) 56   Temp 98.5 F (36.9 C)   Resp 16   Ht 5\' 3"  (1.6 m)   Wt 168 lb (76.2 kg)   SpO2 91%   BMI 29.76 kg/m    Physical Exam Vitals and nursing note reviewed.  Constitutional:      General: She is not in acute distress.    Appearance: She is well-developed. She is not diaphoretic.  HENT:     Head: Normocephalic and atraumatic.     Mouth/Throat:     Pharynx: No oropharyngeal exudate.  Eyes:     Pupils: Pupils are equal, round, and reactive to light.  Neck:     Thyroid: No thyromegaly.     Vascular: No JVD.     Trachea: No tracheal deviation.  Cardiovascular:     Rate and Rhythm: Normal rate and regular rhythm.     Heart sounds: Normal heart sounds. No murmur heard.   No friction rub. No gallop.  Pulmonary:     Effort: Pulmonary effort is normal. No respiratory distress.     Breath sounds: No wheezing or rales.  Chest:     Chest wall: No tenderness.  Abdominal:     General: Bowel sounds are normal.     Palpations: Abdomen is soft.  Musculoskeletal:        General: Normal range of motion.     Cervical back: Normal range of motion and neck supple.  Lymphadenopathy:     Cervical: No cervical adenopathy.  Skin:    General: Skin is warm and dry.  Neurological:     Mental Status: She is alert and oriented to person, place, and time.     Cranial Nerves: No cranial nerve deficit.  Psychiatric:        Behavior: Behavior normal.        Thought Content: Thought content normal.        Judgment: Judgment normal.       Assessment/Plan: 1. Hypersomnia Based on previous stroke, new onset A. fib and history of hypertension and atherosclerosis, as well as significant daytime sleepiness we will go ahead and order PSG for  further evaluation. - PSG SLEEP STUDY; Future  2. New onset a-fib [facility]) Patient has appointment set up with cardiology this week, however advised to contact their office sooner if she begins to feel symptomatic  3. Primary hypertension Stable, continue current medications  4. Cigarette nicotine dependence with other nicotine-induced disorder Patient did not tolerate Wellbutrin, therefore we will have her increase her Lexapro and take 2 tablets daily to help with smoking cessation.  Educated on the importance of quitting entirely and at least on tapering down  5. Atherosclerosis of native artery of both lower extremities with intermittent claudication (HCC) Continue statin, followed by vascular  6. GAD (generalized anxiety disorder) We will go ahead and increase Lexapro to help with smoking cessation as well as anxiety  7. Other fatigue - CBC w/Diff/Platelet - Comprehensive metabolic panel - K09  and Folate Panel - TSH + free T4 - Lipid Panel With LDL/HDL Ratio - Iron, TIBC and Ferritin Panel   General Counseling: Chynna verbalizes understanding of the findings of todays visit and agrees with plan of treatment. I have discussed any further diagnostic evaluation that may be needed or ordered today. We also reviewed her medications today. she has been encouraged to call the office with any questions or concerns that should arise related to todays visit.    Orders Placed This Encounter  Procedures   CBC w/Diff/Platelet   Comprehensive metabolic panel   K38 and Folate Panel   TSH + free T4   Lipid Panel With LDL/HDL Ratio   Iron, TIBC and Ferritin Panel   PSG SLEEP STUDY    No orders of the defined types were placed in this encounter.   This patient was seen by Drema Dallas, PA-C in collaboration with Dr. Clayborn Bigness as a part of collaborative care agreement.   Total time spent:35 Minutes Time spent includes review of chart, medications, test results, and follow up plan  with the patient.      Dr [name] Internal medicine

## 2020-12-09 ENCOUNTER — Encounter: Payer: Self-pay | Admitting: Nurse Practitioner

## 2020-12-09 ENCOUNTER — Ambulatory Visit (INDEPENDENT_AMBULATORY_CARE_PROVIDER_SITE_OTHER): Payer: Medicare Other | Admitting: Nurse Practitioner

## 2020-12-09 ENCOUNTER — Other Ambulatory Visit: Payer: Self-pay

## 2020-12-09 VITALS — BP 114/78 | HR 55 | Ht 63.5 in | Wt 169.0 lb

## 2020-12-09 DIAGNOSIS — I739 Peripheral vascular disease, unspecified: Secondary | ICD-10-CM | POA: Diagnosis not present

## 2020-12-09 DIAGNOSIS — I10 Essential (primary) hypertension: Secondary | ICD-10-CM | POA: Diagnosis not present

## 2020-12-09 DIAGNOSIS — I480 Paroxysmal atrial fibrillation: Secondary | ICD-10-CM | POA: Diagnosis not present

## 2020-12-09 DIAGNOSIS — E785 Hyperlipidemia, unspecified: Secondary | ICD-10-CM

## 2020-12-09 DIAGNOSIS — I639 Cerebral infarction, unspecified: Secondary | ICD-10-CM

## 2020-12-09 DIAGNOSIS — Z720 Tobacco use: Secondary | ICD-10-CM | POA: Diagnosis not present

## 2020-12-09 MED ORDER — APIXABAN 5 MG PO TABS: 5.0000 mg | ORAL_TABLET | Freq: Two times a day (BID) | ORAL | 3 refills | Status: DC

## 2020-12-09 NOTE — Progress Notes (Signed)
Office Visit    Patient Name: Sarah Barnes Date of Encounter: 12/09/2020  Primary Care Provider:  [name], MD Primary Cardiologist:  EP: Lars Mage, MD  Chief Complaint    70 year old female with a history of nonobstructive CAD, hypertension, hyperlipidemia, tobacco abuse, alcohol abuse, diastolic dysfunction, anxiety, depression, COPD, peripheral arterial disease, and cryptogenic stroke, who presents for follow-up related to recent finding of paroxysmal atrial fibrillation on implantable loop recorder.  Past Medical History    Past Medical History:  Diagnosis Date   Agoraphobia with panic disorder    Anemia    Arthritis    "all over" (08/13/2016)   Chronic bronchitis (HCC)    COPD (chronic obstructive pulmonary disease) ([place])    Cryptogenic stroke ([place])    a. 06/2020 Head CT: low density caudate nucleus concerning for infarct; b. 07/2020 s/p MDT Linq; c. 11/2020 Finding of Afib on Linq.   Depression    Dyspnea    occ   Dysrhythmia    palpitations occ due to anxiety   ETOH abuse    GAD (generalized anxiety disorder)    GERD (gastroesophageal reflux disease)    Headache    "daily til I got glasses; now have headache once in awhile" (08/13/2016)   Hepatitis 1960   "isolated &  hospitalized for 2 weeks; don't know which kind of hepatitis"    Hernia, hiatal    High cholesterol    Hypertension    Hypertrophic obstructive cardiomyopathy (HCC)    Mild aortic stenosis    a. 01/2019 Echo: EF 55-60%. Mild AS; b. 12/2019 Echo: Nl EF. Mild diast dysfxn. Trace AS/AI/TR. Mild MR.   Nonobstructive CAD (coronary artery disease)    a. 12/2016 Cath: LM nl, LAD min irregs, LCX nl, OM1/2 min irregs, RCA nl, RPDA min irregs. EF 55-65%.   PAD (peripheral artery disease) ([place])    a. 09/29/2020 s/p PTA/DBA to L prox/mid SFA and stenting of L SFA; b. 10/06/2020 PTA of R TP trunk and prox peroenal. PTA/DBA of R SFA and prox R Popliteal. R SFA stenting x 2; c. 10/2020 ABI: R 0.92, L 0.93.    PAF (paroxysmal atrial fibrillation) ([place])    a.11/2020 PAF noted on Linq; b. CHA2DS2VASc = 6-->eliquis.   Pneumonia    "once or twice" (08/13/2016)   PONV (postoperative nausea and vomiting)    Sciatica    Tobacco abuse    Past Surgical History:  Procedure Laterality Date   ANAL FISTULECTOMY  1970s X 3   BACK SURGERY     HERNIA REPAIR     KNEE ARTHROSCOPY Right    LAPAROSCOPIC CHOLECYSTECTOMY     LAPAROSCOPIC INCISIONAL / UMBILICAL / VENTRAL HERNIA REPAIR  08/13/2016   VHR w/mesh   LOWER EXTREMITY ANGIOGRAPHY Left 09/29/2020   Procedure: LOWER EXTREMITY ANGIOGRAPHY;  Surgeon: Algernon Huxley, MD;  Location: [facility];  Service: Cardiovascular;  Laterality: Left;   LOWER EXTREMITY ANGIOGRAPHY Right 10/06/2020   Procedure: LOWER EXTREMITY ANGIOGRAPHY;  Surgeon: Algernon Huxley, MD;  Location: [facility];  Service: Cardiovascular;  Laterality: Right;   LUMBAR DISC SURGERY     "herniated disc"   NASAL FRACTURE SURGERY  1970s X 2   RIGHT/LEFT HEART CATH AND CORONARY ANGIOGRAPHY N/A 12/27/2016   Procedure: RIGHT/LEFT HEART CATH AND CORONARY ANGIOGRAPHY;  Surgeon: Leonie Man, MD;  Location: [facility]: Mild-moderate pulmonary hypertension.  Hyperdynamic ventricle noted.  EF 55-65% -hyperdynamic (unable to measure LVOT  gradient).  Mildly elevated very tortuous but angiographically normal coronary arteries, suggesting hypertensive heart disease   SHOULDER ARTHROSCOPY WITH ROTATOR CUFF REPAIR Right 1990s?   TONSILLECTOMY  1951   TRANSTHORACIC ECHOCARDIOGRAM  12/2016    EF 60-65% with dynamic outflow tract obstruction at rest. Peak gradient 52 mmHg consistent with HOCM.  GRII DD area. Aortic sclerosis but no stenosis. Systolic anterior motion of mitral valve chordae. Mild mitral stenosis. Moderate LA dilation and mild RA dilation. Peak PA pressures 35 mmHg.   VENTRAL HERNIA REPAIR N/A 08/13/2016   Procedure: LAPAROSCOPIC VENTRAL HERNIA REPAIR WITH MESH;  Surgeon: Judeth Horn, MD;  Location: [facility];  Service: General;  Laterality: N/A;    Allergies  No Known Allergies  History of Present Illness    70 year old female with the above past medical history including nonobstructive CAD, hypertension, hyperlipidemia, tobacco abuse, alcohol abuse, diastolic dysfunction, anxiety, depression, COPD, peripheral arterial disease, and cryptogenic stroke.  She previously underwent diagnostic cardiac catheterization in August 2018 showing minor irregularities in the LAD, obtuse marginal branches, and RPDA, with normal LV function.  She was medically managed.  Echocardiogram in August 2021 showed normal LV function with mild diastolic dysfunction, and mild mitral regurgitation.  She was admitted to [facility] in February of this year with right-sided facial numbness and blurred vision in the right eye.  CT scan showed a low-density caudate nucleus concerning for infarction.  TEE was not performed.  She was subsequently discharged and followed up with electrophysiology with placement of an implantable loop recorder in March.  Patient has continued to struggle with unsteady gait and also right eye vision disturbance.  In May, in the setting of claudication, she underwent peripheral angiography revealing severe bilateral lower extremity disease with staged PTA and stenting of the left lower extremity on May 12 followed by similar on the right on May 19 (see past medical history for details).  Follow-up ABI in June was normal bilaterally.  Since then, patient has not had claudication.  Recent evaluation of monitoring revealed paroxysmal atrial fibrillation the patient was contacted to arrange for follow-up and initiation of anticoagulation therapy.  She notes occasional palpitations but these are typically brief.  She has never had sustained palpitations.  She continues to smoke cigarettes but no longer drinks.  She does not believe she snores and is not interested in pursuing a sleep  study.  She denies any prior or recent history of melena, bright red blood per rectum, or falls.  She denies chest pain, dyspnea, PND, orthopnea, syncope, edema, or early satiety.  She is interested in starting Eliquis therapy.  Home Medications    Current Outpatient Medications  Medication Sig Dispense Refill   apixaban (ELIQUIS) 5 MG TABS tablet Take 1 tablet (5 mg total) by mouth 2 (two) times daily. 180 tablet 3   atorvastatin (LIPITOR) 40 MG tablet Take 1 tablet (40 mg total) by mouth daily. 90 tablet 1   buPROPion (WELLBUTRIN XL) 150 MG 24 hr tablet TAKE 1 TABLET BY MOUTH  DAILY 90 tablet 3   busPIRone (BUSPAR) 15 MG tablet TAKE 1 TABLET BY MOUTH  TWICE DAILY 180 tablet 0   carvedilol (COREG) 12.5 MG tablet Take by mouth 2 (two) times daily.     celecoxib (CELEBREX) 200 MG capsule Take 1 capsule (200 mg total) by mouth daily. 90 capsule 1   cilostazol (PLETAL) 50 MG tablet TAKE 1 TABLET BY MOUTH  TWICE DAILY 180 tablet 3   clopidogrel (PLAVIX) 75  MG tablet Take 1 tablet (75 mg total) by mouth daily. 30 tablet 11   escitalopram (LEXAPRO) 10 MG tablet Take 1 tablet (10 mg total) by mouth daily. 90 tablet 1   fenofibrate micronized (LOFIBRA) 134 MG capsule Take 1 capsule (134 mg total) by mouth daily before breakfast. NEEDS APPOINTMENT FOR FUTURE REFILLS 15 capsule 0   folic acid (FOLVITE) 1 MG tablet Take 1 tablet (1 mg total) by mouth daily. 30 tablet 0   furosemide (LASIX) 40 MG tablet TAKE 1 TABLET BY MOUTH  DAILY 90 tablet 3   gabapentin (NEURONTIN) 100 MG capsule TAKE 2 CAPSULES BY MOUTH IN THE MORNING, 1 CAPSULE IN  THE AFTERNOON AND 2  CAPSULES AT NIGHT 450 capsule 3   meclizine (ANTIVERT) 25 MG tablet Take 1 tablet (25 mg total) by mouth 3 (three) times daily as needed for dizziness. 30 tablet 0   Multiple Vitamin (MULTIVITAMIN WITH MINERALS) TABS tablet Take 1 tablet by mouth daily.     nicotine (NICODERM CQ - DOSED IN MG/24 HOURS) 21 mg/24hr patch Place 1 patch (21 mg total) onto  the skin daily. 28 patch 0   pantoprazole (PROTONIX) 40 MG tablet Take 1 tablet (40 mg total) by mouth daily. NEEDS APPOINTMENT FOR FUTURE REFILLS 90 tablet 1   Potassium 99 MG TABS Take by mouth.     tiZANidine (ZANAFLEX) 2 MG tablet TAKE 1 TABLET BY MOUTH  EVERY 8 HOURS AS NEEDED FOR MUSCLE SPASM(S) 270 tablet 1   traMADol (ULTRAM) 50 MG tablet Take 50-100 mg by mouth every 6 (six) hours as needed. for pain     traZODone (DESYREL) 100 MG tablet TAKE 1 TABLET BY MOUTH  DAILY AT BEDTIME 90 tablet 3   valsartan (DIOVAN) 160 MG tablet TAKE 1 TABLET BY MOUTH  DAILY 90 tablet 3   No current facility-administered medications for this visit.     Review of Systems    She continues to have unsteady gait and right eye visual disturbance in the setting of prior stroke.  She occasionally notes palpitations which are brief and relatively asymptomatic.  She denies chest pain, dyspnea, PND, orthopnea, syncope, edema, early satiety, melena, bright red blood per rectum, or falls.  All other systems reviewed and are otherwise negative except as noted above.  Physical Exam    VS:  BP 114/78 (BP Location: Left Arm, Patient Position: Sitting, Cuff Size: Normal)   Pulse (!) 55   Ht 5' 3.5" (1.613 m)   Wt 169 lb (76.7 kg)   SpO2 97%   BMI 29.47 kg/m  , BMI Body mass index is 29.47 kg/m.     GEN: Well nourished, well developed, in no acute distress. HEENT: normal. Neck: Supple, no JVD, carotid bruits, or masses. Cardiac: RRR, 2/6 systolic murmur heard throughout, no rubs, or gallops. No clubbing, cyanosis, edema.  Radials/PT 2+ and equal bilaterally.  Respiratory:  Respirations regular and unlabored, clear to auscultation bilaterally. GI: Soft, nontender, nondistended, BS + x 4. MS: no deformity or atrophy. Skin: warm and dry, no rash. Neuro:  Strength and sensation are intact. Psych: Normal affect.  Accessory Clinical Findings    ECG personally reviewed by me today -sinus bradycardia, 55, no acute  ST or T changes - no acute changes.  Lab Results  Component Value Date   WBC 11.6 (H) 07/06/2020   HGB 13.6 07/06/2020   HCT 38.7 07/06/2020   MCV 91.9 07/06/2020   PLT 272 07/06/2020   Lab Results  Component Value Date   CREATININE 1.23 (H) 10/06/2020   BUN 18 10/06/2020   NA 136 07/06/2020   K 3.9 07/06/2020   CL 98 07/06/2020   CO2 27 07/06/2020   Lab Results  Component Value Date   ALT 14 07/04/2020   AST 19 07/04/2020   ALKPHOS 40 07/04/2020   BILITOT 0.7 07/04/2020   Lab Results  Component Value Date   CHOL 174 07/05/2020   HDL 37 (L) 07/05/2020   LDLCALC 90 07/05/2020   LDLDIRECT 66.0 10/20/2015   TRIG 237 (H) 07/05/2020   CHOLHDL 4.7 07/05/2020    Lab Results  Component Value Date   HGBA1C 5.1 07/05/2020    Assessment & Plan    1.  Paroxysmal atrial fibrillation: Patient is status post cryptogenic stroke in February of this year with subsequent placement of an implantable loop recorder.  She was recently noted to have a brief episode of atrial fibrillation with recommendation for initiation of oral anticoagulation in the setting of a CHA2DS2-VASc of 6.  We discussed the role of atrial fibrillation and stroke development and anticoagulation and stroke prevention today.  Patient is interested in starting Eliquis.  Most recent labs were in May with a creatinine of 1.23.  H&H were normal in February.  She denies any history of bleeding or falls.  She does not have time to follow-up labs today.  We will send in a prescription for Eliquis 5 mg twice daily and plan for follow-up CBC and basic metabolic panel in 1 month.  Patient remains on carvedilol therapy and though she notes occasional palpitations, these are typically brief and relatively asymptomatic.  She is currently on aspirin and Plavix and given that we are initiating Eliquis, I will discontinue aspirin.  I offered to arrange for a sleep study but she declines at this time.  2.  Prior stroke: She continues with  some unsteadiness to her gait and right visual disturbance but generally gets around okay.  She remains on statin therapy.  Initiating Eliquis as above.  3.  Peripheral arterial disease: Status post bilateral lower extremity angioplasty and stenting in May.  Given that she will not be on Eliquis, I am going to discontinue aspirin.  She remains on Plavix.  ABIs in June were normal.  4.  Essential hypertension: Stable on beta-blocker and ARB therapy.  5.  Hyperlipidemia: LDL of 90 in February.  She remains on Lipitor therapy and has not had follow-up lipids since then.  We can consider follow-up lipids when she returns in 1 month.  6.  Tobacco abuse: Patient continues to smoke.  I encouraged her to consider using a nicotine patch.  She did not tolerate Wellbutrin and is not interested in Chantix.  Complete cessation advised.  7.  History of alcohol abuse: She notes that she is no longer drinking.  I congratulated her on this and discussed the importance of remaining off alcohol in the setting of atrial fibrillation.  8.  Disposition: Follow-up in 1 month with plan to check CBC, complete metabolic panel, and lipids at that time.  Murray Hodgkins, NP 12/09/2020, 5:34 PM

## 2020-12-09 NOTE — Patient Instructions (Signed)
Medication Instructions:  Your physician has recommended you make the following change in your medication:   START Eliquis 5 mg twice a day STOP Aspirin    Medication Samples have been provided to the patient.  Drug name: Eliquis Strength: 5 mg         Qty: 2 boxes   LOT: [id number] Exp.Date: 7/24   *If you need a refill on your cardiac medications before your next appointment, please call your pharmacy*   Lab Work: None  If you have labs (blood work) drawn today and your tests are completely normal, you will receive your results only by: [place] (if you have MyChart) OR A paper copy in the mail If you have any lab test that is abnormal or we need to change your treatment, we will call you to review the results.   Testing/Procedures: None   Follow-Up: At [facility], you and your health needs are our priority.  As part of our continuing mission to provide you with exceptional heart care, we have created designated Provider Care Teams.  These Care Teams include your primary Cardiologist (physician) and Advanced Practice Providers (APPs -  Physician Assistants and Nurse Practitioners) who all work together to provide you with the care you need, when you need it.   Your next appointment:   1 month(s)  The format for your next appointment:   In Person  Provider:   Lars Mage, MD

## 2020-12-11 NOTE — Patient Instructions (Signed)
Tobacco Use Disorder Tobacco use disorder (TUD) occurs when a person craves, seeks, and uses tobacco, regardless of the consequences. This disorder can cause problems with mental and physical health. It can affect your ability to have healthy relationships, and it can keep you from meeting your responsibilities at [facility], or school. Tobacco may be: Smoked as a cigarette or cigar. Inhaled using e-cigarettes. Smoked in a pipe or hookah. Chewed as smokeless tobacco. Inhaled into the nostrils as snuff. Tobacco products contain a dangerous chemical called nicotine, which is very addictive. Nicotine triggers hormones that make the body feel stimulated and works on areas of the brain that make you feel good. These effects can make ithard for people to quit nicotine. Tobacco contains many other unsafe chemicals that can damage almost every organ in the body. Smoking tobacco also puts others in danger due to fire risk andpossible health problems caused by breathing in secondhand smoke. What are the signs or symptoms? Symptoms of TUD may include: Being unable to slow down or stop your tobacco use. Spending an abnormal amount of time getting or using tobacco. Craving tobacco. Cravings may last for up to 6 months after quitting. Tobacco use that: Interferes with your work, school, or home life. Interferes with your personal and social relationships. Makes you give up activities that you once enjoyed or found important. Using tobacco even though you know that it is: Dangerous or bad for your health or someone else's health. Causing problems in your life. Needing more and more of the substance to get the same effect (developing tolerance). Experiencing unpleasant symptoms if you do not use the substance (withdrawal). Withdrawal symptoms may include: Depressed, anxious, or irritable mood. Difficulty concentrating. Increased appetite. Restlessness or trouble sleeping. Using the substance to avoid  withdrawal. How is this diagnosed? This condition may be diagnosed based on: Your current and past tobacco use. Your health care provider may ask questions about how your tobacco use affects your life. A physical exam. You may be diagnosed with TUD if you have at least two symptoms within a73-monthperiod. How is this treated? This condition is treated by stopping tobacco use. Many people are unable to quit on their own and need help. Treatment may include: Nicotine replacement therapy (NRT). NRT provides nicotine without the other harmful chemicals in tobacco. NRT gradually lowers the dosage of nicotine in the body and reduces withdrawal symptoms. NRT is available as: Over-the-counter gums, lozenges, and skin patches. Prescription mouth inhalers and nasal sprays. Medicine that acts on the brain to reduce cravings and withdrawal symptoms. A type of talk therapy that examines your triggers for tobacco use, how to avoid them, and how to cope with cravings (behavioral therapy). Hypnosis. This may help with withdrawal symptoms. Joining a support group for others coping with TUD. The best treatment for TUD is usually a combination of medicine, talk therapy, and support groups. Recovery can be a long process. Many people start using tobacco again after stopping (relapse). If you relapse, it does not mean that treatment will not work. Follow these instructions at home:  Lifestyle Do not use any products that contain nicotine or tobacco, such as cigarettes and e-cigarettes. Avoid things that trigger tobacco use as much as you can. Triggers include people and situations that usually cause you to use tobacco. Avoid drinks that contain caffeine, including coffee. These may worsen some withdrawal symptoms. Find ways to manage stress. Wanting to smoke may cause stress, and stress can make you want to smoke. Relaxation techniques  such as deep breathing, meditation, and yoga may help. Attend support groups  as needed. These groups are an important part of long-term recovery for many people. General instructions Take over-the-counter and prescription medicines only as told by your health care provider. Check with your health care provider before taking any new prescription or over-the-counter medicines. Decide on a friend, family member, or smoking quit-line (such as 1-800-QUIT-NOW in the [place] that you can call or text when you feel the urge to smoke or when you need help coping with cravings. Keep all follow-up visits as told by your health care provider and therapist. This is important. Contact a health care provider if: You are not able to take your medicines as prescribed. Your symptoms get worse, even with treatment. Summary Tobacco use disorder (TUD) occurs when a person craves, seeks, and uses tobacco regardless of the consequences. This condition may be diagnosed based on your current and past tobacco use and a physical exam. Many people are unable to quit on their own and need help. Recovery can be a long process. The most effective treatment for TUD is usually a combination of medicine, talk therapy, and support groups. This information is not intended to replace advice given to you by your health care provider. Make sure you discuss any questions you have with your healthcare provider. Document Revised: 03/29/2020 Document Reviewed: 03/29/2020 Elsevier Patient Education  2022 [employer].

## 2020-12-12 ENCOUNTER — Other Ambulatory Visit: Payer: Self-pay

## 2020-12-12 ENCOUNTER — Other Ambulatory Visit: Payer: Self-pay | Admitting: Physician Assistant

## 2020-12-12 ENCOUNTER — Telehealth: Payer: Self-pay

## 2020-12-12 DIAGNOSIS — M064 Inflammatory polyarthropathy: Secondary | ICD-10-CM

## 2020-12-12 MED ORDER — CELECOXIB 100 MG PO CAPS: 100.0000 mg | ORAL_CAPSULE | Freq: Every day | ORAL | 0 refills | Status: DC

## 2020-12-12 NOTE — Telephone Encounter (Signed)
As per lauren due to Celebrex and eliquis  interact each other  cause GI bleeds lauren decrease to 100 mg of celebrex and advised pt to keep eye on GI bleeds symptoms

## 2020-12-15 ENCOUNTER — Telehealth: Payer: Self-pay

## 2020-12-15 NOTE — Progress Notes (Signed)
Carelink Summary Report / Loop Recorder 

## 2020-12-15 NOTE — Telephone Encounter (Signed)
PSG orders have been sent to [facility].

## 2020-12-23 ENCOUNTER — Telehealth: Payer: Self-pay

## 2020-12-23 NOTE — Telephone Encounter (Signed)
LMOM that we printed lab order and its up front for pick up should she want to have the physical order but pt is away orders are in the system

## 2020-12-23 NOTE — Telephone Encounter (Signed)
PER [facility] THIS PT IS SCHEDULED TO HAVE SLEEP STUDY DONE ON  Thursday December 29, 2020.

## 2020-12-26 ENCOUNTER — Ambulatory Visit (INDEPENDENT_AMBULATORY_CARE_PROVIDER_SITE_OTHER): Payer: Medicare Other

## 2020-12-26 DIAGNOSIS — I639 Cerebral infarction, unspecified: Secondary | ICD-10-CM

## 2020-12-29 ENCOUNTER — Encounter (INDEPENDENT_AMBULATORY_CARE_PROVIDER_SITE_OTHER): Payer: Medicare Other | Admitting: Internal Medicine

## 2020-12-29 DIAGNOSIS — G4733 Obstructive sleep apnea (adult) (pediatric): Secondary | ICD-10-CM | POA: Diagnosis not present

## 2020-12-29 DIAGNOSIS — G4719 Other hypersomnia: Secondary | ICD-10-CM

## 2020-12-30 DIAGNOSIS — E782 Mixed hyperlipidemia: Secondary | ICD-10-CM | POA: Diagnosis not present

## 2020-12-30 DIAGNOSIS — R5383 Other fatigue: Secondary | ICD-10-CM | POA: Diagnosis not present

## 2020-12-31 LAB — COMPREHENSIVE METABOLIC PANEL
ALT: 9 IU/L (ref 0–32)
AST: 13 IU/L (ref 0–40)
Albumin/Globulin Ratio: 2.1 (ref 1.2–2.2)
Albumin: 4.6 g/dL (ref 3.7–4.7)
Alkaline Phosphatase: 75 IU/L (ref 44–121)
BUN/Creatinine Ratio: 17 (ref 12–28)
BUN: 15 mg/dL (ref 8–27)
Bilirubin Total: 0.8 mg/dL (ref 0.0–1.2)
CO2: 23 mmol/L (ref 20–29)
Calcium: 10 mg/dL (ref 8.7–10.3)
Chloride: 103 mmol/L (ref 96–106)
Creatinine, Ser: 0.89 mg/dL (ref 0.57–1.00)
Globulin, Total: 2.2 g/dL (ref 1.5–4.5)
Glucose: 111 mg/dL — ABNORMAL HIGH (ref 65–99)
Potassium: 3.8 mmol/L (ref 3.5–5.2)
Sodium: 141 mmol/L (ref 134–144)
Total Protein: 6.8 g/dL (ref 6.0–8.5)
eGFR: 69 mL/min/{1.73_m2} (ref >59)

## 2020-12-31 LAB — IRON,TIBC AND FERRITIN PANEL
Ferritin: 103 ng/mL (ref 15–150)
Iron Saturation: 29 % (ref 15–55)
Iron: 84 ug/dL (ref 27–139)
Total Iron Binding Capacity: 293 ug/dL (ref 250–450)
UIBC: 209 ug/dL (ref 118–369)

## 2020-12-31 LAB — CBC WITH DIFFERENTIAL/PLATELET
Basophils Absolute: 0.1 10*3/uL (ref 0.0–0.2)
Basos: 1 %
EOS (ABSOLUTE): 0.2 10*3/uL (ref 0.0–0.4)
Eos: 2 %
Hematocrit: 47.2 % — ABNORMAL HIGH (ref 34.0–46.6)
Hemoglobin: 16.3 g/dL — ABNORMAL HIGH (ref 11.1–15.9)
Immature Grans (Abs): 0 10*3/uL (ref 0.0–0.1)
Immature Granulocytes: 0 %
Lymphocytes Absolute: 5.8 10*3/uL — ABNORMAL HIGH (ref 0.7–3.1)
Lymphs: 45 %
MCH: 31.2 pg (ref 26.6–33.0)
MCHC: 34.5 g/dL (ref 31.5–35.7)
MCV: 90 fL (ref 79–97)
Monocytes Absolute: 0.9 10*3/uL (ref 0.1–0.9)
Monocytes: 7 %
Neutrophils Absolute: 6 10*3/uL (ref 1.4–7.0)
Neutrophils: 45 %
Platelets: 229 10*3/uL (ref 150–450)
RBC: 5.22 x10E6/uL (ref 3.77–5.28)
RDW: 12.8 % (ref 11.7–15.4)
WBC: 13 10*3/uL — ABNORMAL HIGH (ref 3.4–10.8)

## 2020-12-31 LAB — LIPID PANEL WITH LDL/HDL RATIO
Cholesterol, Total: 145 mg/dL (ref 100–199)
HDL: 40 mg/dL (ref >39)
LDL Chol Calc (NIH): 57 mg/dL (ref 0–99)
LDL/HDL Ratio: 1.4 ratio (ref 0.0–3.2)
Triglycerides: 310 mg/dL — ABNORMAL HIGH (ref 0–149)
VLDL Cholesterol Cal: 48 mg/dL — ABNORMAL HIGH (ref 5–40)

## 2020-12-31 LAB — TSH+FREE T4
Free T4: 1.49 ng/dL (ref 0.82–1.77)
TSH: 1.89 u[IU]/mL (ref 0.450–4.500)

## 2020-12-31 LAB — B12 AND FOLATE PANEL
Folate: >20 ng/mL (ref >3.0)
Vitamin B-12: 507 pg/mL (ref 232–1245)

## 2021-01-02 LAB — CUP PACEART REMOTE DEVICE CHECK
Date Time Interrogation Session: 20220811143829
Implantable Pulse Generator Implant Date: 20220330

## 2021-01-03 DIAGNOSIS — G4733 Obstructive sleep apnea (adult) (pediatric): Secondary | ICD-10-CM | POA: Insufficient documentation

## 2021-01-03 DIAGNOSIS — G4719 Other hypersomnia: Secondary | ICD-10-CM | POA: Insufficient documentation

## 2021-01-03 NOTE — Procedures (Signed)
[place] Report Part I                                                                 Phone: [phone] Fax: [phone]  Patient Name: Sarah Barnes, Sarah Barnes Acquisition Number: [id number]  Date of Birth: 09/01/1950 Acquisition Date: 12/29/2020  Referring Physician: Drema Dallas, PA-C     History: The patient is a 70 year old female who was referred for evaluation of possible sleep apnea. Medical History: history of stroke, atrial fibrillation.  Medications: axiban, atorvastatin, bupropion, buspirone, carvedilol, celecoxib, ciloxtazol, clopidgrel, escitalopram, fenofibrate, folic acid, furosemide, gabapentin, meclizine, pantoprazole, potassium, tizanidine, tramadol, valsartan.  Procedure: This routine overnight polysomnogram was performed on the [facility] 5 using the standard diagnostic protocol. This included 6 channels of EEG, 2 channels of EOG, chin EMG, bilateral anterior tibialis EMG, nasal/oral thermistor, PTAF (nasal pressure transducer), chest and abdominal wall movements, EKG, and pulse oximetry.  Description: The total recording time was 392.8 minutes. The total sleep time was 321.5 minutes. There were a total of 70.1 minutes of wakefulness after sleep onset for a reducedsleep efficiency of 81.8%. The latency to sleep onset was shortat 1.2 minutes. The R sleep onset latency was prolonged at 150.0 minutes. Sleep parameters, as a percentage of the total sleep time, demonstrated 5.3% of sleep was in N1 sleep, 57.1% N2, 24.9% N3 and 12.8% R sleep. There were a total of 132 arousals for an arousal index of 24.6 arousals per hour of sleep that was elevated.  Respiratory monitoring demonstrated no significant snoring in any position. There were 127 apneas and hypopneas for an Apnea Hypopnea Index of 23.7 apneas and hypopneas per hour of sleep. The REM related apnea hypopnea index was 1.5/hr of REM sleep compared to a NREM AHI of 27.0/hr. The Respiratory  Disturbance Index, which includes 3 respiratory effort related arousals (RERAs), was 24.3 respiratory events per hour of sleep.  The average duration of the respiratory events was 18.3 seconds with a maximum duration of 28.5 seconds. The respiratory events occurred virtually exclusively in the supine position with an AHI of 61.5. The respiratory events were associated with peripheral oxygen desaturations on the average to 89%. The lowest oxygen desaturation associated with a respiratory event was 85%. Additionally, the baseline oxygen saturation during wakefulness was 93%, during NREM sleep averaged 93%, and during REM sleep averaged 95%. The total duration of oxygen < 90% was 20.9 minutes and <80% was 0.3 minutes.  Cardiac monitoring- did not demonstrate transient cardiac decelerations associated with the apneas. There were no significant cardiac rhythm irregularities. Frequent, PACs were observed.  Periodic limb movement monitoring- demonstrated that there were 275 periodic limb movements for a periodic limb movement index of 51.3 periodic limb movements per hour of sleep. Quasi-periodic limb movements were observed during periods of wakefulness.  Impression: This routine overnight polysomnogram demonstrated significant, position-dependent obstructive sleep apnea with an overall Apnea Hypopnea Index of 23.7 apneas and hypopneas per hour of sleep, which increased to 61.5 in the supine position.  There was a significantly elevated periodic limb movement index of 51.3 periodic limb movements per hour of sleep. In addition, quasi-periodic limb movements were observed during periods of wakefulness. Sometimes these limb movements subside once the apnea is controlled. Clinical  correlation is suggested.   There was a reduced sleep efficiency with anelevated arousal index and a reduced percentage of REM sleep.  These findings would appear to be due to the combination of obstructive sleep apnea and periodic limb  movements.   Recommendations:    A CPAP titration would be recommended due to the severity of the sleep apnea. Some supine sleep should be ensured to optimize the titration. Additionally, would recommend weight loss in a patient with a BMI of 29.6.     Allyne Gee, MD, [facility] Diplomate ABMS-Pulmonary, Critical Care and Sleep Medicine  Electronically reviewed and digitally signed     [place] Report Part II  Phone: [phone] Fax: [phone]  Patient last name Barnes Neck Size 12  in. Acquisition [id number]  Patient first name Sarah Weight  167  lbs. Started 12/29/2020 at 9:23:25 PM  Birth date April 14, 1951 Height  63  in. Stopped 12/30/2020 at 4:10:43 AM  Age 16 BMI 29.58   lb/in2 Duration 392.8  Study Type Adult      Report genterated by : [name], RPSGT Sleep Data: Lights Out: 9:28:13 PM Sleep Onset: 9:29:25 PM  Lights On: 4:01:01 AM Sleep Efficiency: 81.8 %  Total Recording Time: 392.8 min Sleep Latency (from Lights Off) 1.2 min  Total Sleep Time (TST): 321.5 min R Latency (from Sleep Onset): 150.0 min  Sleep Period Time: 334.5 min Total number of awakenings: 5  Wake during sleep: 13.0 min Wake After Sleep Onset (WASO): 70.1 min   Sleep Data:         Arousal Summary: Stage  Latency from lights out (min) Latency from sleep onset (min) Duration (min) % Total Sleep Time  Normal values  N 1 1.2 0.0 17.0 5.3 (5%)  N 2 10.7 9.5 183.5 57.1 (50%)  N 3 36.7 35.5 80.0 24.9 (20%)  R 151.2 150.0 41.0 12.8 (25%)    Number Index  Spontaneous 26 4.9  Apneas & Hypopneas 101 18.8  RERAs 3 0.6       (Apneas & Hypopneas & RERAs)  (104) (19.4)  Limb Movement 3 0.6  Snore 0 0.0  TOTAL 133 24.8     Respiratory Data:  CA OA MA Apnea Hypopnea* A+ H RERA Total  Number 0 '24 1 25 '$ 102 127 3 130  Mean Dur (sec) 0.0 18.6 26.0 18.9 18.2 18.4 15.2 18.3  Max Dur (sec) 0.0 28.5 26.0 28.5 28.0 28.5 16.0 28.5  Total Dur (min) 0.0 7.5 0.4 7.9 30.9 38.8 0.8  39.6  % of TST 0.0 2.3 0.1 2.5 9.6 12.1 0.2 12.3  Index (#/h TST) 0.0 4.5 0.2 4.7 19.0 23.7 0.6 24.3  *Hypopneas scored based on 4% or greater desaturation.  Sleep Stage:        REM NREM TST  AHI 1.5 27.0 23.7  RDI 1.5 27.6 24.3           Body Position Data:  Sleep (min) TST (%) REM (min) NREM (min) CA (#) OA (#) MA (#) HYP (#) AHI (#/h) RERA (#) RDI (#/h) Desat (#)  Supine 123.0 38.26 0.0 123.0 0 24 1 101 61.5 3 62.9 130  Non-Supine 198.50 61.74 41.00 157.50 0.00 0.00 0.00 1.00 0.30 0 0.30 8.00  Left: 10.0 3.11 0.0 10.0 0 0 0 0 0.0 0 0.00 3  Right: 188.5 58.63 41.0 147.5 0 0 0 1 0.3 0 0.3 5     Snoring: Total number of snoring episodes  0  Total time with snoring    min (   % of sleep)   Oximetry Distribution:             WK REM NREM TOTAL  Average (%)   93 95 93 93  < 90% 1.0 0.0 19.9 20.9  < 80% 0.3 0.0 0.0 0.3  < 70% 0.3 0.0 0.0 0.3  # of Desaturations* 0 3 135 138  Desat Index (#/hour) 0.0 4.4 28.9 25.8  Desat Max (%) 0 '3 9 9  '$ Desat Max Dur (sec) 0.0 101.0 98.0 101.0  Approx Min O2 during sleep 85  Approx min O2 during a respiratory event 85  Was Oxygen added (Y/N) and final rate No:   0 LPM  *Desaturations based on 3% or greater drop from baseline.   Cheyne Stokes Breathing: None Present    Heart Rate Summary:  Average Heart Rate During Sleep 62.1 bpm      Highest Heart Rate During Sleep (95th %) 68.0 bpm      Highest Heart Rate During Sleep 186 bpm (artifact)  Highest Heart Rate During Recording (TIB) 255 bpm (artifact)   Heart Rate Observations: Event Type # Events   Bradycardia 0 Lowest HR Scored: N/A  Sinus Tachycardia During Sleep 0 Highest HR Scored: N/A  Narrow Complex Tachycardia 0 Highest HR Scored: N/A  Wide Complex Tachycardia 0 Highest HR Scored: N/A  Asystole 0 Longest Pause: N/A  Atrial Fibrillation 0 Duration Longest Event: N/A  Other Arrythmias  No Type:    Periodic Limb Movement Data: (Primary legs unless otherwise  noted) Total # Limb Movement 275 Limb Movement Index 51.3  Total # PLMS 275 PLMS Index 51.3  Total # PLMS Arousals 3 PLMS Arousal Index 0.6  Percentage Sleep Time with PLMS 101.66mn (31.5 % sleep)  Mean Duration limb movements (secs) 3035.3

## 2021-01-05 ENCOUNTER — Other Ambulatory Visit: Payer: Self-pay

## 2021-01-05 ENCOUNTER — Telehealth: Payer: Self-pay

## 2021-01-05 ENCOUNTER — Ambulatory Visit (INDEPENDENT_AMBULATORY_CARE_PROVIDER_SITE_OTHER): Payer: Medicare Other | Admitting: Physician Assistant

## 2021-01-05 ENCOUNTER — Encounter: Payer: Self-pay | Admitting: Physician Assistant

## 2021-01-05 DIAGNOSIS — I4891 Unspecified atrial fibrillation: Secondary | ICD-10-CM | POA: Diagnosis not present

## 2021-01-05 DIAGNOSIS — G4733 Obstructive sleep apnea (adult) (pediatric): Secondary | ICD-10-CM

## 2021-01-05 DIAGNOSIS — F411 Generalized anxiety disorder: Secondary | ICD-10-CM

## 2021-01-05 DIAGNOSIS — I70213 Atherosclerosis of native arteries of extremities with intermittent claudication, bilateral legs: Secondary | ICD-10-CM | POA: Diagnosis not present

## 2021-01-05 DIAGNOSIS — I10 Essential (primary) hypertension: Secondary | ICD-10-CM

## 2021-01-05 DIAGNOSIS — F17218 Nicotine dependence, cigarettes, with other nicotine-induced disorders: Secondary | ICD-10-CM

## 2021-01-05 DIAGNOSIS — E2839 Other primary ovarian failure: Secondary | ICD-10-CM

## 2021-01-05 MED ORDER — HYDROXYZINE PAMOATE 25 MG PO CAPS: 25.0000 mg | ORAL_CAPSULE | Freq: Three times a day (TID) | ORAL | 1 refills | Status: DC | PRN

## 2021-01-05 MED ORDER — ZOSTER VAC RECOMB ADJUVANTED 50 MCG/0.5ML IM SUSR: 0.5000 mL | Freq: Once | INTRAMUSCULAR | 0 refills | Status: AC

## 2021-01-05 NOTE — Progress Notes (Signed)
[facility] [place], [place] [address]  Internal MEDICINE  Office Visit Note  Patient Name: Sarah Barnes  [id number]  [id number]  Date of Service: 01/10/2021  Chief Complaint  Patient presents with   Follow-up    Sleep study   Anxiety   Hypertension    HPI Pt is here for routine follow up -She is having increased anxiety lately for the past few weeks. Stopped wellbutrin because of increased smoking rather than it aiding cessation -she is taking '20mg'$  lexapro and does not want to increase further a this point in time. Would rather have something to add as needed. Also mentions some itchiness and discussed hydroxyzine as an aid for both. -Sept 1st is scheduled for CPAP titration -She is due for a bone density scan -Reviewed labs--TG remain elevated but overall stable; elevated h&H possibly due to untreated OSA and smoking. Also shows lymphocytosis which will need to be monitored  Current Medication: Outpatient Encounter Medications as of 01/05/2021  Medication Sig   apixaban (ELIQUIS) 5 MG TABS tablet Take 1 tablet (5 mg total) by mouth 2 (two) times daily.   buPROPion (WELLBUTRIN XL) 150 MG 24 hr tablet TAKE 1 TABLET BY MOUTH  DAILY   busPIRone (BUSPAR) 15 MG tablet TAKE 1 TABLET BY MOUTH  TWICE DAILY   carvedilol (COREG) 12.5 MG tablet Take by mouth 2 (two) times daily.   celecoxib (CELEBREX) 100 MG capsule Take 1 capsule (100 mg total) by mouth daily.   cilostazol (PLETAL) 50 MG tablet TAKE 1 TABLET BY MOUTH  TWICE DAILY   clopidogrel (PLAVIX) 75 MG tablet Take 1 tablet (75 mg total) by mouth daily.   escitalopram (LEXAPRO) 10 MG tablet Take 1 tablet (10 mg total) by mouth daily.   fenofibrate micronized (LOFIBRA) 134 MG capsule Take 1 capsule (134 mg total) by mouth daily before breakfast. NEEDS APPOINTMENT FOR FUTURE REFILLS   folic acid (FOLVITE) 1 MG tablet Take 1 tablet (1 mg total) by mouth daily.   furosemide (LASIX) 40 MG tablet TAKE 1  TABLET BY MOUTH  DAILY   gabapentin (NEURONTIN) 100 MG capsule TAKE 2 CAPSULES BY MOUTH IN THE MORNING, 1 CAPSULE IN  THE AFTERNOON AND 2  CAPSULES AT NIGHT   hydrOXYzine (VISTARIL) 25 MG capsule Take 1 capsule (25 mg total) by mouth every 8 (eight) hours as needed.   meclizine (ANTIVERT) 25 MG tablet Take 1 tablet (25 mg total) by mouth 3 (three) times daily as needed for dizziness.   Multiple Vitamin (MULTIVITAMIN WITH MINERALS) TABS tablet Take 1 tablet by mouth daily.   pantoprazole (PROTONIX) 40 MG tablet Take 1 tablet (40 mg total) by mouth daily. NEEDS APPOINTMENT FOR FUTURE REFILLS   Potassium 99 MG TABS Take by mouth.   tiZANidine (ZANAFLEX) 2 MG tablet TAKE 1 TABLET BY MOUTH  EVERY 8 HOURS AS NEEDED FOR MUSCLE SPASM(S)   traMADol (ULTRAM) 50 MG tablet Take 50-100 mg by mouth every 6 (six) hours as needed. for pain   traZODone (DESYREL) 100 MG tablet TAKE 1 TABLET BY MOUTH  DAILY AT BEDTIME   valsartan (DIOVAN) 160 MG tablet TAKE 1 TABLET BY MOUTH  DAILY   [DISCONTINUED] Zoster Vaccine Adjuvanted [facility]) injection Inject 0.5 mLs into the muscle once.   atorvastatin (LIPITOR) 40 MG tablet Take 1 tablet (40 mg total) by mouth daily.   [EXPIRED] Zoster Vaccine Adjuvanted [facility]) injection Inject 0.5 mLs into the muscle once for 1 dose.   [DISCONTINUED] nicotine (  NICODERM CQ - DOSED IN MG/24 HOURS) 21 mg/24hr patch Place 1 patch (21 mg total) onto the skin daily.   No facility-administered encounter medications on file as of 01/05/2021.    Surgical History: Past Surgical History:  Procedure Laterality Date   ANAL FISTULECTOMY  1970s X 3   BACK SURGERY     HERNIA REPAIR     KNEE ARTHROSCOPY Right    LAPAROSCOPIC CHOLECYSTECTOMY     LAPAROSCOPIC INCISIONAL / UMBILICAL / VENTRAL HERNIA REPAIR  08/13/2016   VHR w/mesh   LOWER EXTREMITY ANGIOGRAPHY Left 09/29/2020   Procedure: LOWER EXTREMITY ANGIOGRAPHY;  Surgeon: Algernon Huxley, MD;  Location: [place] CV LAB;  Service:  Cardiovascular;  Laterality: Left;   LOWER EXTREMITY ANGIOGRAPHY Right 10/06/2020   Procedure: LOWER EXTREMITY ANGIOGRAPHY;  Surgeon: Algernon Huxley, MD;  Location: [place] CV [facility];  Service: Cardiovascular;  Laterality: Right;   LUMBAR DISC SURGERY     "herniated disc"   NASAL FRACTURE SURGERY  1970s X 2   RIGHT/LEFT HEART CATH AND CORONARY ANGIOGRAPHY N/A 12/27/2016   Procedure: RIGHT/LEFT HEART CATH AND CORONARY ANGIOGRAPHY;  Surgeon: Leonie Man, MD;  Location: [place] INVASIVE CV LAB: Mild-moderate pulmonary hypertension.  Hyperdynamic ventricle noted.  EF 55-65% -hyperdynamic (unable to measure LVOT gradient).  Mildly elevated very tortuous but angiographically normal coronary arteries, suggesting hypertensive heart disease   SHOULDER ARTHROSCOPY WITH ROTATOR CUFF REPAIR Right 1990s?   TONSILLECTOMY  1951   TRANSTHORACIC ECHOCARDIOGRAM  12/2016    EF 60-65% with dynamic outflow tract obstruction at rest. Peak gradient 52 mmHg consistent with HOCM.  GRII DD area. Aortic sclerosis but no stenosis. Systolic anterior motion of mitral valve chordae. Mild mitral stenosis. Moderate LA dilation and mild RA dilation. Peak PA pressures 35 mmHg.   VENTRAL HERNIA REPAIR N/A 08/13/2016   Procedure: LAPAROSCOPIC VENTRAL HERNIA REPAIR WITH MESH;  Surgeon: Judeth Horn, MD;  Location: [facility];  Service: General;  Laterality: N/A;    Medical History: Past Medical History:  Diagnosis Date   Agoraphobia with panic disorder    Anemia    Arthritis    "all over" (08/13/2016)   Chronic bronchitis ([place])    COPD (chronic obstructive pulmonary disease) ([place])    Cryptogenic stroke ([place])    a. 06/2020 Head CT: low density caudate nucleus concerning for infarct; b. 07/2020 s/p MDT Linq; c. 11/2020 Finding of Afib on Linq.   Depression    Dyspnea    occ   Dysrhythmia    palpitations occ due to anxiety   ETOH abuse    GAD (generalized anxiety disorder)    GERD (gastroesophageal reflux disease)    Headache     "daily til I got glasses; now have headache once in awhile" (08/13/2016)   Hepatitis 1960   "isolated &  hospitalized for 2 weeks; don't know which kind of hepatitis"    Hernia, hiatal    High cholesterol    Hypertension    Hypertrophic obstructive cardiomyopathy (HCC)    Mild aortic stenosis    a. 01/2019 Echo: EF 55-60%. Mild AS; b. 12/2019 Echo: Nl EF. Mild diast dysfxn. Trace AS/AI/TR. Mild MR.   Nonobstructive CAD (coronary artery disease)    a. 12/2016 Cath: LM nl, LAD min irregs, LCX nl, OM1/2 min irregs, RCA nl, RPDA min irregs. EF 55-65%.   PAD (peripheral artery disease) ([place])    a. 09/29/2020 s/p PTA/DBA to L prox/mid SFA and stenting of L SFA; b. 10/06/2020 PTA of R  TP trunk and prox peroenal. PTA/DBA of R SFA and prox R Popliteal. R SFA stenting x 2; c. 10/2020 ABI: R 0.92, L 0.93.   PAF (paroxysmal atrial fibrillation) ([place])    a.11/2020 PAF noted on Linq; b. CHA2DS2VASc = 6-->eliquis.   Pneumonia    "once or twice" (08/13/2016)   PONV (postoperative nausea and vomiting)    Sciatica    Tobacco abuse     Family History: Family History  Problem Relation Age of Onset   Diabetes Mother    Hyperlipidemia Mother    Hypertension Mother    Heart attack Mother 20   Angina Mother        chronic problem   Hypertension Father    Stroke Father    Cancer Brother     Social History   Socioeconomic History   Marital status: Single    Spouse name: Not on file   Number of children: Not on file   Years of education: Not on file   Highest education level: Not on file  Occupational History   Not on file  Tobacco Use   Smoking status: Every Day    Packs/day: 0.50    Years: 49.00    Pack years: 24.50    Types: Cigarettes   Smokeless tobacco: Never  Substance and Sexual Activity   Alcohol use: Not Currently    Alcohol/week: 84.0 standard drinks    Types: 36 Cans of beer, 48 Shots of liquor per week    Comment: occ   Drug use: Not Currently    Frequency: 1.0 times per week     Types: Marijuana, Cocaine    Comment: Cocaine + March 2017   Sexual activity: Not Currently  Other Topics Concern   Not on file  Social History Narrative   Taber has generalized anxiety disorder and clearcut Agoraphobia.   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    She currently lives with her sister.   Currently denies substance abuse beyond Tobacco (1-1/2 PPD) & EtOH (beer, vodka) - not ready to discuss quitting.   Social Determinants of Health   Financial Resource Strain: Not on file  Food Insecurity: Not on file  Transportation Needs: Not on file  Physical Activity: Not on file  Stress: Not on file  Social Connections: Not on file  Intimate Partner Violence: Not on file      Review of Systems  Constitutional:  Positive for fatigue. Negative for chills and unexpected weight change.  HENT:  Negative for congestion, postnasal drip, rhinorrhea, sneezing and sore throat.   Eyes:  Negative for redness.  Respiratory:  Negative for cough, chest tightness and shortness of breath.   Cardiovascular:  Negative for chest pain, palpitations and leg swelling.  Gastrointestinal:  Negative for abdominal pain, constipation, diarrhea, nausea and vomiting.  Genitourinary:  Negative for dysuria and frequency.  Musculoskeletal:  Positive for arthralgias and myalgias. Negative for back pain, joint swelling and neck pain.  Skin:  Negative for rash.  Neurological: Negative.  Negative for tremors and numbness.  Hematological:  Negative for adenopathy. Does not bruise/bleed easily.  Psychiatric/Behavioral:  Positive for sleep disturbance. Negative for behavioral problems (Depression) and suicidal ideas. The patient is nervous/anxious.    Vital Signs: BP 110/68   Pulse (!) 56   Temp 97.8 F (36.6 C)   Resp 16   Ht 5' 3.5" (  1.613 m)   Wt 162 lb 12.8 oz (73.8 kg)   SpO2 96%   BMI 28.39 kg/m     Physical Exam Vitals and nursing note reviewed.  Constitutional:      General: She is not in acute distress.    Appearance: She is well-developed. She is not diaphoretic.  HENT:     Head: Normocephalic and atraumatic.     Mouth/Throat:     Pharynx: No oropharyngeal exudate.  Eyes:     Pupils: Pupils are equal, round, and reactive to light.  Neck:     Thyroid: No thyromegaly.     Vascular: No JVD.     Trachea: No tracheal deviation.  Cardiovascular:     Rate and Rhythm: Normal rate and regular rhythm.     Heart sounds: Normal heart sounds. No murmur heard.   No friction rub. No gallop.  Pulmonary:     Effort: Pulmonary effort is normal. No respiratory distress.     Breath sounds: No wheezing or rales.  Chest:     Chest wall: No tenderness.  Abdominal:     General: Bowel sounds are normal.     Palpations: Abdomen is soft.  Musculoskeletal:        General: Normal range of motion.     Cervical back: Normal range of motion and neck supple.  Lymphadenopathy:     Cervical: No cervical adenopathy.  Skin:    General: Skin is warm and dry.  Neurological:     Mental Status: She is alert and oriented to person, place, and time.     Cranial Nerves: No cranial nerve deficit.  Psychiatric:        Behavior: Behavior normal.        Thought Content: Thought content normal.        Judgment: Judgment normal.     Comments: Anxious in office       Assessment/Plan: 1. GAD (generalized anxiety disorder) Will add hydroxyzine as needed for anxiety and itching, and may continue current medications  2. Primary hypertension Stable, continue current medications  3. New onset a-fib [facility]) Followed by cardiology, on eliquis  4. OSA (obstructive sleep apnea) Titration scheduled for sept 1 then will start on CPAP  5. Cigarette nicotine dependence with other nicotine-induced disorder Continue to work on smoking cessation, discussed that nicotine patches would not be recommended for her  due to afib and prior stroke and pt expressed understanding  6. Atherosclerosis of native artery of both lower extremities with intermittent claudication (HCC) Followed by vascular, continue lipitor  7. Primary ovarian failure - DG Bone Density; Future   General Counseling: Cami verbalizes understanding of the findings of todays visit and agrees with plan of treatment. I have discussed any further diagnostic evaluation that may be needed or ordered today. We also reviewed her medications today. she has been encouraged to call the office with any questions or concerns that should arise related to todays visit.    Orders Placed This Encounter  Procedures   DG Bone Density    Meds ordered this encounter  Medications   hydrOXYzine (VISTARIL) 25 MG capsule    Sig: Take 1 capsule (25 mg total) by mouth every 8 (eight) hours as needed.    Dispense:  90 capsule    Refill:  1   Zoster Vaccine Adjuvanted [facility]) injection    Sig: Inject 0.5 mLs into the muscle once for 1 dose.    Dispense:  0.5 mL    Refill:  0    This patient was seen by Drema Dallas, PA-C in collaboration with Dr. Clayborn Bigness as a part of collaborative care agreement.   Total time spent:35 Minutes Time spent includes review of chart, medications, test results, and follow up plan with the patient.      Dr [name] Internal medicine

## 2021-01-05 NOTE — Telephone Encounter (Signed)
I spoke with patient to let her know her bone density is scheduled 01/19/21 @ 1:20 and to stop any calcium supplements-Toni

## 2021-01-17 NOTE — Progress Notes (Signed)
[facility] Summary Report 

## 2021-01-18 ENCOUNTER — Other Ambulatory Visit: Payer: Self-pay

## 2021-01-18 ENCOUNTER — Ambulatory Visit (INDEPENDENT_AMBULATORY_CARE_PROVIDER_SITE_OTHER): Payer: Medicare Other | Admitting: Cardiology

## 2021-01-18 ENCOUNTER — Encounter: Payer: Self-pay | Admitting: Cardiology

## 2021-01-18 VITALS — BP 120/66 | HR 63 | Ht 63.5 in | Wt 159.0 lb

## 2021-01-18 DIAGNOSIS — Z720 Tobacco use: Secondary | ICD-10-CM

## 2021-01-18 DIAGNOSIS — I639 Cerebral infarction, unspecified: Secondary | ICD-10-CM | POA: Diagnosis not present

## 2021-01-18 DIAGNOSIS — Z959 Presence of cardiac and vascular implant and graft, unspecified: Secondary | ICD-10-CM | POA: Diagnosis not present

## 2021-01-18 DIAGNOSIS — I10 Essential (primary) hypertension: Secondary | ICD-10-CM | POA: Diagnosis not present

## 2021-01-18 DIAGNOSIS — I480 Paroxysmal atrial fibrillation: Secondary | ICD-10-CM | POA: Diagnosis not present

## 2021-01-18 NOTE — Progress Notes (Signed)
Electrophysiology Office Follow up Visit Note:    Date:  01/18/2021   ID:  Sarah Barnes, DOB [birth year], MRN [id number]  PCP:  [name], MD  [facility] Cardiologist:  None  [facility] Electrophysiologist:  Vickie Epley, MD    Interval History:    Sarah Barnes is a 70 y.o. female who presents for a follow up visit.  I previously saw the patient on August 17, 2020 for cryptogenic stroke.  Loop recorder was implanted.  Loop recorder monitoring since that time has demonstrated atrial fibrillation for which the patient was started on Eliquis for secondary stroke prophylaxis.  She is tolerating the Eliquis without bleeding issues.  She presents today for routine follow-up after starting the anticoagulant.     Past Medical History:  Diagnosis Date   Agoraphobia with panic disorder    Anemia    Arthritis    "all over" (08/13/2016)   Chronic bronchitis (HCC)    COPD (chronic obstructive pulmonary disease) ([place])    Cryptogenic stroke ([place])    a. 06/2020 Head CT: low density caudate nucleus concerning for infarct; b. 07/2020 s/p MDT Linq; c. 11/2020 Finding of Afib on Linq.   Depression    Dyspnea    occ   Dysrhythmia    palpitations occ due to anxiety   ETOH abuse    GAD (generalized anxiety disorder)    GERD (gastroesophageal reflux disease)    Headache    "daily til I got glasses; now have headache once in awhile" (08/13/2016)   Hepatitis 1960   "isolated &  hospitalized for 2 weeks; don't know which kind of hepatitis"    Hernia, hiatal    High cholesterol    Hypertension    Hypertrophic obstructive cardiomyopathy (HCC)    Mild aortic stenosis    a. 01/2019 Echo: EF 55-60%. Mild AS; b. 12/2019 Echo: Nl EF. Mild diast dysfxn. Trace AS/AI/TR. Mild MR.   Nonobstructive CAD (coronary artery disease)    a. 12/2016 Cath: LM nl, LAD min irregs, LCX nl, OM1/2 min irregs, RCA nl, RPDA min irregs. EF 55-65%.   PAD (peripheral artery disease) ([place])    a. 09/29/2020 s/p  PTA/DBA to L prox/mid SFA and stenting of L SFA; b. 10/06/2020 PTA of R TP trunk and prox peroenal. PTA/DBA of R SFA and prox R Popliteal. R SFA stenting x 2; c. 10/2020 ABI: R 0.92, L 0.93.   PAF (paroxysmal atrial fibrillation) ([place])    a.11/2020 PAF noted on Linq; b. CHA2DS2VASc = 6-->eliquis.   Pneumonia    "once or twice" (08/13/2016)   PONV (postoperative nausea and vomiting)    Sciatica    Tobacco abuse     Past Surgical History:  Procedure Laterality Date   ANAL FISTULECTOMY  1970s X 3   BACK SURGERY     HERNIA REPAIR     KNEE ARTHROSCOPY Right    LAPAROSCOPIC CHOLECYSTECTOMY     LAPAROSCOPIC INCISIONAL / UMBILICAL / VENTRAL HERNIA REPAIR  08/13/2016   VHR w/mesh   LOWER EXTREMITY ANGIOGRAPHY Left 09/29/2020   Procedure: LOWER EXTREMITY ANGIOGRAPHY;  Surgeon: Algernon Huxley, MD;  Location: [place] [facility];  Service: Cardiovascular;  Laterality: Left;   LOWER EXTREMITY ANGIOGRAPHY Right 10/06/2020   Procedure: LOWER EXTREMITY ANGIOGRAPHY;  Surgeon: Algernon Huxley, MD;  Location: [place] [facility];  Service: Cardiovascular;  Laterality: Right;   LUMBAR DISC SURGERY     "herniated disc"   NASAL FRACTURE SURGERY  1970s  X 2   RIGHT/LEFT HEART CATH AND CORONARY ANGIOGRAPHY N/A 12/27/2016   Procedure: RIGHT/LEFT HEART CATH AND CORONARY ANGIOGRAPHY;  Surgeon: Leonie Man, MD;  Location: [facility] INVASIVE CV LAB: Mild-moderate pulmonary hypertension.  Hyperdynamic ventricle noted.  EF 55-65% -hyperdynamic (unable to measure LVOT gradient).  Mildly elevated very tortuous but angiographically normal coronary arteries, suggesting hypertensive heart disease   SHOULDER ARTHROSCOPY WITH ROTATOR CUFF REPAIR Right 1990s?   TONSILLECTOMY  1951   TRANSTHORACIC ECHOCARDIOGRAM  12/2016    EF 60-65% with dynamic outflow tract obstruction at rest. Peak gradient 52 mmHg consistent with HOCM.  GRII DD area. Aortic sclerosis but no stenosis. Systolic anterior motion of mitral valve chordae. Mild mitral  stenosis. Moderate LA dilation and mild RA dilation. Peak PA pressures 35 mmHg.   VENTRAL HERNIA REPAIR N/A 08/13/2016   Procedure: LAPAROSCOPIC VENTRAL HERNIA REPAIR WITH MESH;  Surgeon: Judeth Horn, MD;  Location: [place];  Service: General;  Laterality: N/A;    Current Medications: Current Meds  Medication Sig   apixaban (ELIQUIS) 5 MG TABS tablet Take 1 tablet (5 mg total) by mouth 2 (two) times daily.   atorvastatin (LIPITOR) 40 MG tablet Take 1 tablet (40 mg total) by mouth daily.   buPROPion (WELLBUTRIN XL) 150 MG 24 hr tablet TAKE 1 TABLET BY MOUTH  DAILY   busPIRone (BUSPAR) 15 MG tablet TAKE 1 TABLET BY MOUTH  TWICE DAILY   carvedilol (COREG) 12.5 MG tablet Take by mouth 2 (two) times daily.   celecoxib (CELEBREX) 100 MG capsule Take 1 capsule (100 mg total) by mouth daily.   cilostazol (PLETAL) 50 MG tablet TAKE 1 TABLET BY MOUTH  TWICE DAILY   clopidogrel (PLAVIX) 75 MG tablet Take 1 tablet (75 mg total) by mouth daily.   escitalopram (LEXAPRO) 10 MG tablet Take 1 tablet (10 mg total) by mouth daily.   fenofibrate micronized (LOFIBRA) 134 MG capsule Take 1 capsule (134 mg total) by mouth daily before breakfast. NEEDS APPOINTMENT FOR FUTURE REFILLS   folic acid (FOLVITE) 1 MG tablet Take 1 tablet (1 mg total) by mouth daily.   furosemide (LASIX) 40 MG tablet TAKE 1 TABLET BY MOUTH  DAILY   gabapentin (NEURONTIN) 100 MG capsule TAKE 2 CAPSULES BY MOUTH IN THE MORNING, 1 CAPSULE IN  THE AFTERNOON AND 2  CAPSULES AT NIGHT   hydrOXYzine (VISTARIL) 25 MG capsule Take 1 capsule (25 mg total) by mouth every 8 (eight) hours as needed.   meclizine (ANTIVERT) 25 MG tablet Take 1 tablet (25 mg total) by mouth 3 (three) times daily as needed for dizziness.   Multiple Vitamin (MULTIVITAMIN WITH MINERALS) TABS tablet Take 1 tablet by mouth daily.   pantoprazole (PROTONIX) 40 MG tablet Take 1 tablet (40 mg total) by mouth daily. NEEDS APPOINTMENT FOR FUTURE REFILLS   Potassium 99 MG TABS Take by  mouth.   tiZANidine (ZANAFLEX) 2 MG tablet TAKE 1 TABLET BY MOUTH  EVERY 8 HOURS AS NEEDED FOR MUSCLE SPASM(S)   traMADol (ULTRAM) 50 MG tablet Take 50-100 mg by mouth every 6 (six) hours as needed. for pain   traZODone (DESYREL) 100 MG tablet TAKE 1 TABLET BY MOUTH  DAILY AT BEDTIME   valsartan (DIOVAN) 160 MG tablet TAKE 1 TABLET BY MOUTH  DAILY     Allergies:   Patient has no known allergies.   Social History   Socioeconomic History   Marital status: Single    Spouse name: Not on file   Number  of children: Not on file   Years of education: Not on file   Highest education level: Not on file  Occupational History   Not on file  Tobacco Use   Smoking status: Every Day    Packs/day: 0.50    Years: 49.00    Pack years: 24.50    Types: Cigarettes   Smokeless tobacco: Never  Substance and Sexual Activity   Alcohol use: Not Currently    Alcohol/week: 84.0 standard drinks    Types: 36 Cans of beer, 48 Shots of liquor per week    Comment: occ   Drug use: Not Currently    Frequency: 1.0 times per week    Types: Marijuana, Cocaine    Comment: Cocaine + March 2017   Sexual activity: Not Currently  Other Topics Concern   Not on file  Social History Narrative   Karee has generalized anxiety disorder and clearcut Agoraphobia.   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    She currently lives with her sister.   Currently denies substance abuse beyond Tobacco (1-1/2 PPD) & EtOH (beer, vodka) - not ready to discuss quitting.   Social Determinants of Health   Financial Resource Strain: Not on file  Food Insecurity: Not on file  Transportation Needs: Not on file  Physical Activity: Not on file  Stress: Not on file  Social Connections: Not on file     Family History: The patient's family history includes Angina in her mother; Cancer in her brother; Diabetes in her mother; Heart attack  (age of onset: 79) in her mother; Hyperlipidemia in her mother; Hypertension in her father and mother; Stroke in her father.  ROS:   Please see the history of present illness.    All other systems reviewed and are negative.  EKGs/Labs/Other Studies Reviewed:    The following studies were reviewed today:  January 18, 2021 loop recorder implant personally reviewed Frequent episodes of sinus rhythm with frequent PACs labeled as atrial fibrillation by the device There are episodes of what appear to be actual atrial fibrillation intermixed with the above sinus rhythm with PACs.  EKG:  The ekg ordered today demonstrates sinus rhythm.  Recent Labs: 07/04/2020: B Natriuretic Peptide 137.4 07/06/2020: Magnesium 2.2 12/30/2020: ALT 9; BUN 15; Creatinine, Ser 0.89; Hemoglobin 16.3; Platelets 229; Potassium 3.8; Sodium 141; TSH 1.890  Recent Lipid Panel    Component Value Date/Time   CHOL 145 12/30/2020 0846   TRIG 310 (H) 12/30/2020 0846   HDL 40 12/30/2020 0846   CHOLHDL 4.7 07/05/2020 0545   VLDL 47 (H) 07/05/2020 0545   LDLCALC 57 12/30/2020 0846   LDLDIRECT 66.0 10/20/2015 0841    Physical Exam:    VS:  BP 120/66 (BP Location: Left Arm, Patient Position: Sitting, Cuff Size: Normal)   Pulse 63   Ht 5' 3.5" (1.613 m)   Wt 159 lb (72.1 kg)   BMI 27.72 kg/m     Wt Readings from Last 3 Encounters:  01/18/21 159 lb (72.1 kg)  01/05/21 162 lb 12.8 oz (73.8 kg)  12/09/20 169 lb (76.7 kg)     GEN:  Well nourished, well developed in no acute distress HEENT: Normal NECK: No JVD; No carotid bruits LYMPHATICS: No lymphadenopathy CARDIAC: RRR, no murmurs, rubs, gallops RESPIRATORY:  Clear to auscultation without rales, wheezing or rhonchi  ABDOMEN: Soft, non-tender, non-distended MUSCULOSKELETAL:  No edema; No deformity  SKIN: Warm and dry NEUROLOGIC:  Alert and oriented x 3 PSYCHIATRIC:  Normal affect   ASSESSMENT:    1. Cryptogenic stroke ([place])   2. PAF (paroxysmal atrial  fibrillation) (HCC)   3. Cardiac device in situ   4. Essential hypertension   5. Tobacco abuse    PLAN:    In order of problems listed above:  1. Cryptogenic stroke (HCC) 2. Cardiac device in situ (loop recorder) 3.  Paroxysmal atrial fibrillation  Tolerating anticoagulation.  Very low burden on loop recorder.  Continue to monitor loop for changes to A. fib burden.  3. Essential hypertension Controlled.  Continue current regimen  4. Tobacco abuse Cessation encouraged    Follow-up 1 year or sooner as needed      Total time spent with patient today 30 minutes. This includes reviewing records, evaluating the patient and coordinating care.   Medication Adjustments/Labs and Tests Ordered: Current medicines are reviewed at length with the patient today.  Concerns regarding medicines are outlined above.  Orders Placed This Encounter  Procedures   EKG 12-Lead   No orders of the defined types were placed in this encounter.    Signed, Lars Mage, MD, [facility], [facility] 01/18/2021 1:18 PM    Electrophysiology [facility]

## 2021-01-18 NOTE — Patient Instructions (Signed)
Medication Instructions:  - Your physician recommends that you continue on your current medications as directed. Please refer to the Current Medication list given to you today.  *If you need a refill on your cardiac medications before your next appointment, please call your pharmacy*   Lab Work: - none ordered  If you have labs (blood work) drawn today and your tests are completely normal, you will receive your results only by: [place] (if you have MyChart) OR A paper copy in the mail If you have any lab test that is abnormal or we need to change your treatment, we will call you to review the results.   Testing/Procedures: - none ordered   Follow-Up: At [facility], you and your health needs are our priority.  As part of our continuing mission to provide you with exceptional heart care, we have created designated Provider Care Teams.  These Care Teams include your primary Cardiologist (physician) and Advanced Practice Providers (APPs -  Physician Assistants and Nurse Practitioners) who all work together to provide you with the care you need, when you need it.  We recommend signing up for the patient portal called "MyChart".  Sign up information is provided on this After Visit Summary.  MyChart is used to connect with patients for Virtual Visits (Telemedicine).  Patients are able to view lab/test results, encounter notes, upcoming appointments, etc.  Non-urgent messages can be sent to your provider as well.   To learn more about what you can do with MyChart, go to [email or web address].    Your next appointment:   1 year(s)  The format for your next appointment:   In Person  Provider:   Lars Mage, MD   Other Instructions N/a

## 2021-01-19 ENCOUNTER — Encounter (INDEPENDENT_AMBULATORY_CARE_PROVIDER_SITE_OTHER): Payer: Medicare Other | Admitting: Internal Medicine

## 2021-01-19 ENCOUNTER — Ambulatory Visit
Admission: RE | Admit: 2021-01-19 | Discharge: 2021-01-19 | Disposition: A | Discharge status: Home / Self Care | Payer: Medicare Other | Source: Ambulatory Visit | Attending: Physician Assistant | Admitting: Physician Assistant

## 2021-01-19 DIAGNOSIS — Z780 Asymptomatic menopausal state: Secondary | ICD-10-CM | POA: Diagnosis not present

## 2021-01-19 DIAGNOSIS — M810 Age-related osteoporosis without current pathological fracture: Secondary | ICD-10-CM | POA: Diagnosis not present

## 2021-01-19 DIAGNOSIS — M8588 Other specified disorders of bone density and structure, other site: Secondary | ICD-10-CM | POA: Diagnosis not present

## 2021-01-19 DIAGNOSIS — G4733 Obstructive sleep apnea (adult) (pediatric): Secondary | ICD-10-CM | POA: Diagnosis not present

## 2021-01-19 DIAGNOSIS — E2839 Other primary ovarian failure: Secondary | ICD-10-CM | POA: Insufficient documentation

## 2021-01-24 NOTE — Procedures (Signed)
[facility] Report Part I  Phone: [phone] Fax: [phone]  Patient Name: Sarah, Barnes Acquisition Number: [id number]  Date of Birth: [birth year] Acquisition Date: 01/19/2021  Referring Physician: Drema Dallas, PA-C     History: The patient is a 70 year old female with obstructive sleep apnea for CPAP titration. Medical History: CHF, stroke and atrial fibrillation.  Medications: axiban, atorvastatin, bupropion, buspirone, carvedilol, celecoxib, ciloxtazol, clopidgrel, escitalopram, fenofibrate, folic acid, furosemide, gabapentin, meclizine, pantoprazole, potassium, tizanidine, tramadol, valsartan  Procedure: This routine overnight polysomnogram was performed on the [place] 5 using the standard CPAP protocol. This included 6 channels of EEG, 2 channels of EOG, chin EMG, bilateral anterior tibialis EMG, nasal/oral thermistor, PTAF (nasal pressure transducer), chest and abdominal wall movements, EKG, and pulse oximetry.  Description: The total recording time was 494.0 minutes. The total sleep time was 405.8 minutes. There were a total of 56.5 minutes of wakefulness after sleep onset for areducedsleep efficiency of 82.1%. The latency to sleep onset was slightly prolonged at 31.7 minutes. The R sleep onset latency was slightly prolonged at 126.0 minutes. Sleep parameters, as a percentage of the total sleep time, demonstrated 6.9% of sleep was in N1 sleep, 41.7% N2, 18.6% N3 and 32.8% R sleep. There were a total of 102 arousals for an arousal index of 15.1 arousals per hour of sleep that was slightly elevated.  Overall, there were a total of 72 respiratory events for a respiratory disturbance index, which includes apneas, hypopneas and RERAs (increased respiratory effort) of 10.6 respiratory events per hour of sleep during the pressure titration. CPAP was initiated at 5 cm H2O at lights out, 9:38 p.m. It was titrated in 1-2 cm increments for obstructive events to  11 cm H2O. The apnea was well controlled at this pressure in non-supine, REM sleep. Once the patient moved to the supine position, obstructive events re-emerged and the pressure was further titrated to the final pressure of 17 cm H2O. The apnea was well controlled at the final pressure and supine, REM sleep was observed.  Additionally, the baseline oxygen saturation during wakefulness was 96%, during NREM sleep averaged 95%, and during REM sleep averaged 97%. The total duration of oxygen < 90% was 0.5 minutes.  Cardiac monitoring- intermittent PACs with short runs of atrial tachycardia were observed.  Periodic limb movement monitoring- demonstrated that there were 202 periodic limb movements for a periodic limb movement index of 29.9 periodic limb movements per hour of sleep. Quasi-periodic limb movements were observed during periods of wakefulness.  Impression: This patient's obstructive sleep apnea demonstrated significant improvement with the utilization of nasal CPAP at  11 cm H2O in non-supine and 17 cm H2O in supine sleep.   There was a significantly elevated periodic limb movement index of 29.9 periodic limb movements per hour of sleep. In addition, quasi-periodic limb movements were observed during periods of wakefulness. These limb movements were also observed during the initial polysomnogram. Treatment may be indicated if sleep disruption or sleepiness persist once the patient is fully compliant with CPAP.   Recommendations: Would recommend utilization of auto-adjusting  CPAP at 11-17 cm H2O.      A F&P Small Simplus Full Face mask was used. Chin strap used during study- no. Humidifier used during study- yes.     Allyne Gee, MD, [facility] Diplomate ABMS-Pulmonary, Critical Care and Sleep Medicine  Electronically reviewed and digitally signed     [facility] CPAP/BIPAP Polysomnogram Report Part II Phone: (  [phone] Fax: [phone]  Patient last name [name]  Neck Size  13  in. Acquisition [phone]  Patient first name [name] Weight 167   lbs. Started 01/19/2021 at 9:33:34 PM  Birth date 1951/04/06 Height  63  in. Stopped 01/20/2021 at 5:55:22 AM  Age 45      Type Adult BMI    lb/in2 Duration 494.0  [name], RPSGT Sleep Data: Lights Out: 9:38:22 PM Sleep Onset: 10:10:04 PM  Lights On: 5:52:22 AM Sleep Efficiency: 82.1 %  Total Recording Time: 494.0 min Sleep Latency (from Lights Off) 31.7 min  Total Sleep Time (TST): 405.8 min R Latency (from Sleep Onset): 126.0 min  Sleep Period Time: 462.3 min Total number of awakenings: 8  Wake during sleep: 56.5 min Wake After Sleep Onset (WASO): 56.5 min   Sleep Data:         Arousal Summary: Stage  Latency from lights out (min) Latency from sleep onset (min) Duration (min) % Total Sleep Time  Normal values  N 1 31.7 0.0 28.0 6.9 (5%)  N 2 33.7 2.0 169.3 41.7 (50%)  N 3 51.7 20.0 75.5 18.6 (20%)  R 157.7 126.0 133.0 32.8 (25%)    Number Index  Spontaneous 52 7.7  Apneas & Hypopneas 38 5.6  RERAs 29 4.3       (Apneas & Hypopneas & RERAs)  (67) (9.9)  Limb Movement 5 0.7  Snore 0 0.0  TOTAL 124 18.3     Respiratory Data:  CA OA MA Apnea Hypopnea* A+ H RERA Total  Number 0 5 0 5 38 43 29 72  Mean Dur (sec) 0.0 24.7 0.0 24.7 49.9 47.0 25.5 38.3  Max Dur (sec) 0.0 38.0 0.0 38.0 91.0 91.0 59.5 91.0  Total Dur (min) 0.0 2.1 0.0 2.1 31.6 33.7 12.3 46.0  % of TST 0.0 0.5 0.0 0.5 7.8 8.3 3.0 11.3  Index (#/h TST) 0.0 0.7 0.0 0.7 5.6 6.4 4.3 10.6  *Hypopneas scored based on 4% or greater desaturation.  Sleep Stage:         REM NREM TST  AHI 3.2 7.9 6.4  RDI 5.0 13.4 10.6    Sleep (min) TST (%) REM (min) NREM (min) CA (#) OA (#) MA (#) HYP (#) AHI (#/h) RERA (#) RDI (#/h) Desat (#)  Supine 82.4 20.31 32.0 50.4 0 5 0 29 24.8 5 28.4 31  Non-Supine 323.40 79.69 101.00 222.40 0.00 0.00 0.00 9.00 1.67 24.00 6.12 24.00  Right: 323.1 79.62 101.0 222.1 0 0 0 9 1.7 24 6.1 24  UP: 0.3 0.07 0.0  0.3 0 0 0 0 0.0 0 0.00 0     Snoring: Total number of snoring episodes  0  Total time with snoring    min (   % of sleep)   Oximetry Distribution:             WK REM NREM TOTAL  Average (%)   96 97 95 96  < 90% 0.2 0.0 0.3 0.5  < 80% 0.0 0.0 0.0 0.0  < 70% 0.0 0.0 0.0 0.0  # of Desaturations* 0 6 49 55  Desat Index (#/hour) 0.0 2.7 10.9 8.2  Desat Max (%) 0 '5 9 9  '$ Desat Max Dur (sec) 0.0 84.0 101.0 101.0  Approx Min O2 during sleep 84  Approx min O2 during a respiratory event 84  Was Oxygen added (Y/N) and final rate No:   0 LPM  *Desaturations based on 3% or greater  drop from baseline.   Cheyne Stokes Breathing: None Present    Heart Rate Summary:  Average Heart Rate During Sleep 81.3 bpm      Highest Heart Rate During Sleep (95th %) 152.0 bpm (artifact)  Highest Heart Rate During Sleep 255 bpm (artifact)  Highest Heart Rate During Recording (TIB) 255 bpm (artifact)   Heart Rate Observations: Event Type # Events   Bradycardia 0 Lowest HR Scored: N/A  Sinus Tachycardia During Sleep 1 Highest HR Scored: 83 bpm  Narrow Complex Tachycardia 0 Highest HR Scored: N/A  Wide Complex Tachycardia 0 Highest HR Scored: N/A  Asystole 0 Longest Pause: N/A  Atrial Fibrillation 0 Duration Longest Event: N/A  Other Arrythmias  No Type:   Periodic Limb Movement Data: (Primary legs unless otherwise noted) Total # Limb Movement 202 Limb Movement Index 29.9  Total # PLMS 202 PLMS Index 29.9  Total # PLMS Arousals 5 PLMS Arousal Index 0.7  Percentage Sleep Time with PLMS 74.8mn (18.2 % sleep)  Mean Duration limb movements (secs) 1110.8    IPAP Level (cmH2O) EPAP Level (cmH2O) Total Duration (min) Sleep Duration (min) Sleep (%) REM (%) CA  #) OA # MA # HYP #) AHI (#/hr) RERAs # RERAs (#/hr) RDI (#/hr)  0 0 15.8 0.3 1.9 0.0 0 0 0 0 0.0 0 0.0 0.0  5 5 13.1 13.1 100.0 0.0 0 0 0 5 22.9 7 32.1 55.0  6 6 12.6 10.1 80.2 0.0 0 0 0 0 0.0 2 11.9 11.'9  7 7 '$ 45.7 44.7 97.8 0.0 0 0 0 3 4.0 5 6.7  10.'7  8 8 '$ 51.5 50.0 97.1 0.0 0 0 0 1 1.2 8 9.6 10.'8  9 9 '$ 3.0 3.0 100.0 40.0 0 0 0 0 0.0 0 0.0 0.0  10 10 15.6 15.6 100.0 87.2 0 0 0 0 0.0 2 7.7 7.'7  11 11 '$ 75.2 75.2 100.0 49.2 0 0 0 3 2.4 0 0.0 2.'4  12 12 '$ 12.3 12.3 100.0 0.0 0 0 0 7 34.1 0 0.0 34.'1  13 13 '$ 15.1 15.1 100.0 0.0 0 0 0 8 31.8 0 0.0 31.'8  14 14 '$ 12.1 12.1 100.0 0.0 0 0 0 4 19.8 2 9.9 29.'8  15 15 '$ 11.0 11.0 100.0 58.2 0 5 0 3 43.6 1 5.5 49.'1  16 16 '$ 13.6 13.6 100.0 92.6 0 0 0 4 17.6 2 8.8 26.'5  17 17 '$ 161.5 125.8 77.9 37.6 0 0 0 0 0.0 0 0.0 0.0

## 2021-01-26 ENCOUNTER — Other Ambulatory Visit: Payer: Self-pay | Admitting: Internal Medicine

## 2021-01-26 ENCOUNTER — Ambulatory Visit (INDEPENDENT_AMBULATORY_CARE_PROVIDER_SITE_OTHER): Payer: Medicare Other

## 2021-01-26 DIAGNOSIS — F411 Generalized anxiety disorder: Secondary | ICD-10-CM

## 2021-01-26 DIAGNOSIS — I639 Cerebral infarction, unspecified: Secondary | ICD-10-CM

## 2021-01-27 ENCOUNTER — Telehealth: Payer: Self-pay

## 2021-01-27 NOTE — Telephone Encounter (Signed)
Left vm to confirm 01/30/21 appointment-Toni

## 2021-01-30 ENCOUNTER — Other Ambulatory Visit: Payer: Self-pay

## 2021-01-30 ENCOUNTER — Encounter: Payer: Self-pay | Admitting: Physician Assistant

## 2021-01-30 ENCOUNTER — Ambulatory Visit (INDEPENDENT_AMBULATORY_CARE_PROVIDER_SITE_OTHER): Payer: Medicare Other | Admitting: Physician Assistant

## 2021-01-30 DIAGNOSIS — Z1212 Encounter for screening for malignant neoplasm of rectum: Secondary | ICD-10-CM

## 2021-01-30 DIAGNOSIS — M810 Age-related osteoporosis without current pathological fracture: Secondary | ICD-10-CM

## 2021-01-30 DIAGNOSIS — Z1211 Encounter for screening for malignant neoplasm of colon: Secondary | ICD-10-CM

## 2021-01-30 DIAGNOSIS — Z0001 Encounter for general adult medical examination with abnormal findings: Secondary | ICD-10-CM | POA: Diagnosis not present

## 2021-01-30 DIAGNOSIS — I4891 Unspecified atrial fibrillation: Secondary | ICD-10-CM | POA: Diagnosis not present

## 2021-01-30 DIAGNOSIS — G4733 Obstructive sleep apnea (adult) (pediatric): Secondary | ICD-10-CM | POA: Diagnosis not present

## 2021-01-30 DIAGNOSIS — R300 Dysuria: Secondary | ICD-10-CM | POA: Diagnosis not present

## 2021-01-30 DIAGNOSIS — F411 Generalized anxiety disorder: Secondary | ICD-10-CM | POA: Diagnosis not present

## 2021-01-30 DIAGNOSIS — R197 Diarrhea, unspecified: Secondary | ICD-10-CM | POA: Diagnosis not present

## 2021-01-30 MED ORDER — IBANDRONATE SODIUM 150 MG PO TABS: ORAL_TABLET | ORAL | 3 refills | Status: DC

## 2021-01-30 NOTE — Progress Notes (Signed)
[facility] [place], [place] [address]  Internal MEDICINE  Office Visit Note  Patient Name: Sarah Barnes  [id number]  [id number]  Date of Service: 01/31/2021  Chief Complaint  Patient presents with   Medicare Wellness   Hyperlipidemia   Hypertension   Depression   Quality Metric Gaps    Colonoscopy      HPI Pt is here for routine health maintenance examination -She is doing well today and reports that cardiology has cleared her for annual follow ups on her afib. She continues to be eliquis for this. -She had her CPAP titration study and results indicate she would do best on an APAP 11-17cm H2O which will be ordered today. -declines mammogram  -willing to do colonoscopy--states she has never had one and does admit to having diarrhea many days for a long time. She has never addressed this before. Discussed referring to GI to address this and complete colonscopy. She also has some LLQ discomfort at times but it is intermittent and tolerable. -Eye still bothers her from stroke, may look into seeing new eye doctor -Routine labs discussed at last visit -Bone density results reviewed and discussed that she does have osteoporosis and we will start monthly medication to help this. Advised to take on empty stomach and to remain upright after. She will notify office if unable to tolerate medication.  Current Medication: Outpatient Encounter Medications as of 01/30/2021  Medication Sig   apixaban (ELIQUIS) 5 MG TABS tablet Take 1 tablet (5 mg total) by mouth 2 (two) times daily.   buPROPion (WELLBUTRIN XL) 150 MG 24 hr tablet TAKE 1 TABLET BY MOUTH  DAILY   busPIRone (BUSPAR) 15 MG tablet TAKE 1 TABLET BY MOUTH  TWICE DAILY   carvedilol (COREG) 12.5 MG tablet Take by mouth 2 (two) times daily.   celecoxib (CELEBREX) 100 MG capsule Take 1 capsule (100 mg total) by mouth daily.   cilostazol (PLETAL) 50 MG tablet TAKE 1 TABLET BY MOUTH  TWICE DAILY    clopidogrel (PLAVIX) 75 MG tablet Take 1 tablet (75 mg total) by mouth daily.   escitalopram (LEXAPRO) 10 MG tablet TAKE 1 TABLET BY MOUTH  DAILY   fenofibrate micronized (LOFIBRA) 134 MG capsule Take 1 capsule (134 mg total) by mouth daily before breakfast. NEEDS APPOINTMENT FOR FUTURE REFILLS   folic acid (FOLVITE) 1 MG tablet Take 1 tablet (1 mg total) by mouth daily.   furosemide (LASIX) 40 MG tablet TAKE 1 TABLET BY MOUTH  DAILY   gabapentin (NEURONTIN) 100 MG capsule TAKE 2 CAPSULES BY MOUTH IN THE MORNING, 1 CAPSULE IN  THE AFTERNOON AND 2  CAPSULES AT NIGHT   hydrOXYzine (VISTARIL) 25 MG capsule Take 1 capsule (25 mg total) by mouth every 8 (eight) hours as needed.   ibandronate (BONIVA) 150 MG tablet Take in the morning with a full glass of water, on an empty stomach, and do not take anything else by mouth or lie down for the next 30 min.   meclizine (ANTIVERT) 25 MG tablet Take 1 tablet (25 mg total) by mouth 3 (three) times daily as needed for dizziness.   Multiple Vitamin (MULTIVITAMIN WITH MINERALS) TABS tablet Take 1 tablet by mouth daily.   pantoprazole (PROTONIX) 40 MG tablet Take 1 tablet (40 mg total) by mouth daily. NEEDS APPOINTMENT FOR FUTURE REFILLS   Potassium 99 MG TABS Take by mouth.   tiZANidine (ZANAFLEX) 2 MG tablet TAKE 1 TABLET BY MOUTH  EVERY 8  HOURS AS NEEDED FOR MUSCLE SPASM(S)   traMADol (ULTRAM) 50 MG tablet Take 50-100 mg by mouth every 6 (six) hours as needed. for pain   traZODone (DESYREL) 100 MG tablet TAKE 1 TABLET BY MOUTH  DAILY AT BEDTIME   valsartan (DIOVAN) 160 MG tablet TAKE 1 TABLET BY MOUTH  DAILY   atorvastatin (LIPITOR) 40 MG tablet Take 1 tablet (40 mg total) by mouth daily.   No facility-administered encounter medications on file as of 01/30/2021.    Surgical History: Past Surgical History:  Procedure Laterality Date   ANAL FISTULECTOMY  1970s X 3   BACK SURGERY     HERNIA REPAIR     KNEE ARTHROSCOPY Right    LAPAROSCOPIC CHOLECYSTECTOMY      LAPAROSCOPIC INCISIONAL / UMBILICAL / VENTRAL HERNIA REPAIR  08/13/2016   VHR w/mesh   LOWER EXTREMITY ANGIOGRAPHY Left 09/29/2020   Procedure: LOWER EXTREMITY ANGIOGRAPHY;  Surgeon: Algernon Huxley, MD;  Location: [facility];  Service: Cardiovascular;  Laterality: Left;   LOWER EXTREMITY ANGIOGRAPHY Right 10/06/2020   Procedure: LOWER EXTREMITY ANGIOGRAPHY;  Surgeon: Algernon Huxley, MD;  Location: [facility];  Service: Cardiovascular;  Laterality: Right;   LUMBAR DISC SURGERY     "herniated disc"   NASAL FRACTURE SURGERY  1970s X 2   RIGHT/LEFT HEART CATH AND CORONARY ANGIOGRAPHY N/A 12/27/2016   Procedure: RIGHT/LEFT HEART CATH AND CORONARY ANGIOGRAPHY;  Surgeon: Leonie Man, MD;  Location: [facility]: Mild-moderate pulmonary hypertension.  Hyperdynamic ventricle noted.  EF 55-65% -hyperdynamic (unable to measure LVOT gradient).  Mildly elevated very tortuous but angiographically normal coronary arteries, suggesting hypertensive heart disease   SHOULDER ARTHROSCOPY WITH ROTATOR CUFF REPAIR Right 1990s?   TONSILLECTOMY  1951   TRANSTHORACIC ECHOCARDIOGRAM  12/2016    EF 60-65% with dynamic outflow tract obstruction at rest. Peak gradient 52 mmHg consistent with HOCM.  GRII DD area. Aortic sclerosis but no stenosis. Systolic anterior motion of mitral valve chordae. Mild mitral stenosis. Moderate LA dilation and mild RA dilation. Peak PA pressures 35 mmHg.   VENTRAL HERNIA REPAIR N/A 08/13/2016   Procedure: LAPAROSCOPIC VENTRAL HERNIA REPAIR WITH MESH;  Surgeon: Judeth Horn, MD;  Location: [facility];  Service: General;  Laterality: N/A;    Medical History: Past Medical History:  Diagnosis Date   Agoraphobia with panic disorder    Anemia    Arthritis    "all over" (08/13/2016)   Chronic bronchitis ([place])    COPD (chronic obstructive pulmonary disease) ([place])    Cryptogenic stroke ([place])    a. 06/2020 Head CT: low density caudate nucleus concerning for infarct; b. 07/2020  s/p MDT Linq; c. 11/2020 Finding of Afib on Linq.   Depression    Dyspnea    occ   Dysrhythmia    palpitations occ due to anxiety   ETOH abuse    GAD (generalized anxiety disorder)    GERD (gastroesophageal reflux disease)    Headache    "daily til I got glasses; now have headache once in awhile" (08/13/2016)   Hepatitis 1960   "isolated &  hospitalized for 2 weeks; don't know which kind of hepatitis"    Hernia, hiatal    High cholesterol    Hypertension    Hypertrophic obstructive cardiomyopathy (HCC)    Mild aortic stenosis    a. 01/2019 Echo: EF 55-60%. Mild AS; b. 12/2019 Echo: Nl EF. Mild diast dysfxn. Trace AS/AI/TR. Mild MR.   Nonobstructive CAD (coronary artery  disease)    a. 12/2016 Cath: LM nl, LAD min irregs, LCX nl, OM1/2 min irregs, RCA nl, RPDA min irregs. EF 55-65%.   PAD (peripheral artery disease) ([place])    a. 09/29/2020 s/p PTA/DBA to L prox/mid SFA and stenting of L SFA; b. 10/06/2020 PTA of R TP trunk and prox peroenal. PTA/DBA of R SFA and prox R Popliteal. R SFA stenting x 2; c. 10/2020 ABI: R 0.92, L 0.93.   PAF (paroxysmal atrial fibrillation) ([place])    a.11/2020 PAF noted on Linq; b. CHA2DS2VASc = 6-->eliquis.   Pneumonia    "once or twice" (08/13/2016)   PONV (postoperative nausea and vomiting)    Sciatica    Tobacco abuse     Family History: Family History  Problem Relation Age of Onset   Diabetes Mother    Hyperlipidemia Mother    Hypertension Mother    Heart attack Mother 51   Angina Mother        chronic problem   Hypertension Father    Stroke Father    Cancer Brother       Review of Systems  Constitutional:  Positive for fatigue. Negative for chills and unexpected weight change.  HENT:  Negative for congestion, postnasal drip, rhinorrhea, sneezing and sore throat.   Eyes:  Negative for redness.  Respiratory:  Negative for cough, chest tightness and shortness of breath.   Cardiovascular:  Negative for chest pain, palpitations and leg swelling.   Gastrointestinal:  Positive for diarrhea. Negative for abdominal pain, constipation, nausea and vomiting.  Genitourinary:  Negative for dysuria and frequency.  Musculoskeletal:  Positive for arthralgias and myalgias. Negative for back pain, joint swelling and neck pain.  Skin:  Negative for rash.  Neurological: Negative.  Negative for tremors and numbness.  Hematological:  Negative for adenopathy. Does not bruise/bleed easily.  Psychiatric/Behavioral:  Positive for sleep disturbance. Negative for behavioral problems (Depression) and suicidal ideas. The patient is nervous/anxious.     Vital Signs: BP 130/80   Pulse 65   Temp 97.8 F (36.6 C)   Resp 16   Ht $R'5\' 3"'hY$  (1.6 m)   Wt 158 lb (71.7 kg)   SpO2 97%   BMI 27.99 kg/m    Physical Exam Vitals and nursing note reviewed.  Constitutional:      General: She is not in acute distress.    Appearance: She is well-developed. She is not diaphoretic.  HENT:     Head: Normocephalic and atraumatic.     Right Ear: External ear normal.     Left Ear: External ear normal.     Nose: Nose normal.     Mouth/Throat:     Pharynx: No oropharyngeal exudate.  Eyes:     General: No scleral icterus.       Right eye: No discharge.        Left eye: No discharge.     Conjunctiva/sclera: Conjunctivae normal.     Pupils: Pupils are equal, round, and reactive to light.  Neck:     Thyroid: No thyromegaly.     Vascular: No JVD.     Trachea: No tracheal deviation.  Cardiovascular:     Rate and Rhythm: Normal rate and regular rhythm.     Heart sounds: Normal heart sounds. No murmur heard.   No friction rub. No gallop.  Pulmonary:     Effort: Pulmonary effort is normal. No respiratory distress.     Breath sounds: Normal breath sounds. No stridor. No wheezing or rales.  Chest:     Chest wall: No tenderness.  Breasts:    Right: Normal. No mass.     Left: Normal. No mass.  Abdominal:     General: Bowel sounds are normal. There is no distension.      Palpations: Abdomen is soft. There is no mass.     Tenderness: There is abdominal tenderness. There is no guarding or rebound.     Comments: Mild TTP in LLQ  Musculoskeletal:        General: No tenderness or deformity. Normal range of motion.     Cervical back: Normal range of motion and neck supple.  Lymphadenopathy:     Cervical: No cervical adenopathy.  Skin:    General: Skin is warm and dry.     Coloration: Skin is not pale.     Findings: No erythema or rash.  Neurological:     Mental Status: She is alert.     Cranial Nerves: No cranial nerve deficit.     Motor: No abnormal muscle tone.     Coordination: Coordination normal.     Deep Tendon Reflexes: Reflexes are normal and symmetric.  Psychiatric:        Behavior: Behavior normal.        Thought Content: Thought content normal.        Judgment: Judgment normal.     LABS: Recent Results (from the past 2160 hour(s))  CUP PACEART REMOTE DEVICE CHECK     Status: None   Collection Time: 11/26/20  2:34 PM  Result Value Ref Range   Date Time Interrogation Session [id number]    Pulse Generator Manufacturer MERM    Pulse Gen Model LNQ22 LINQ II    Pulse Gen Serial Number [id number]    Clinic Name [facility]    Implantable Pulse Generator Type ICM/ILR    Implantable Pulse Generator Implant Date [id number]    Eval Rhythm SR at 70 bpm   CUP PACEART REMOTE DEVICE CHECK     Status: None   Collection Time: 12/29/20  2:38 PM  Result Value Ref Range   Date Time Interrogation Session [id number]    Pulse Generator Manufacturer MERM    Pulse Gen Model LNQ22 LINQ II    Pulse Gen Serial Number [id number]    Clinic Name [facility]    Implantable Pulse Generator Type ICM/ILR    Implantable Pulse Generator Implant Date 78938101   CBC w/Diff/Platelet     Status: Abnormal   Collection Time: 12/30/20  8:46 AM  Result Value Ref Range   WBC 13.0 (H) 3.4 - 10.8 x10E3/uL   RBC 5.22 3.77 - 5.28 x10E6/uL   Hemoglobin 16.3 (H)  11.1 - 15.9 g/dL   Hematocrit 47.2 (H) 34.0 - 46.6 %   MCV 90 79 - 97 fL   MCH 31.2 26.6 - 33.0 pg   MCHC 34.5 31.5 - 35.7 g/dL   RDW 12.8 11.7 - 15.4 %   Platelets 229 150 - 450 x10E3/uL   Neutrophils 45 Not Estab. %    Comment: Occasional metamyelocyte seen on scan.   Lymphs 45 Not Estab. %   Monocytes 7 Not Estab. %   Eos 2 Not Estab. %   Basos 1 Not Estab. %   Neutrophils Absolute 6.0 1.4 - 7.0 x10E3/uL   Lymphocytes Absolute 5.8 (H) 0.7 - 3.1 x10E3/uL   Monocytes Absolute 0.9 0.1 - 0.9 x10E3/uL   EOS (ABSOLUTE) 0.2 0.0 - 0.4 x10E3/uL   Basophils Absolute 0.1 0.0 -  0.2 x10E3/uL   Immature Granulocytes 0 Not Estab. %   Immature Grans (Abs) 0.0 0.0 - 0.1 x10E3/uL   Hematology Comments: Note:     Comment: Verified by microscopic examination.  Comprehensive metabolic panel     Status: Abnormal   Collection Time: 12/30/20  8:46 AM  Result Value Ref Range   Glucose 111 (H) 65 - 99 mg/dL   BUN 15 8 - 27 mg/dL   Creatinine, Ser 0.89 0.57 - 1.00 mg/dL   eGFR 69 >59 mL/min/1.73   BUN/Creatinine Ratio 17 12 - 28   Sodium 141 134 - 144 mmol/L   Potassium 3.8 3.5 - 5.2 mmol/L   Chloride 103 96 - 106 mmol/L   CO2 23 20 - 29 mmol/L   Calcium 10.0 8.7 - 10.3 mg/dL   Total Protein 6.8 6.0 - 8.5 g/dL   Albumin 4.6 3.7 - 4.7 g/dL   Globulin, Total 2.2 1.5 - 4.5 g/dL   Albumin/Globulin Ratio 2.1 1.2 - 2.2   Bilirubin Total 0.8 0.0 - 1.2 mg/dL   Alkaline Phosphatase 75 44 - 121 IU/L   AST 13 0 - 40 IU/L   ALT 9 0 - 32 IU/L  B12 and Folate Panel     Status: None   Collection Time: 12/30/20  8:46 AM  Result Value Ref Range   Vitamin B-12 507 232 - 1,245 pg/mL   Folate >20.0 >3.0 ng/mL    Comment: A serum folate concentration of less than 3.1 ng/mL is considered to represent clinical deficiency.   TSH + free T4     Status: None   Collection Time: 12/30/20  8:46 AM  Result Value Ref Range   TSH 1.890 0.450 - 4.500 uIU/mL   Free T4 1.49 0.82 - 1.77 ng/dL  Lipid Panel With LDL/HDL  Ratio     Status: Abnormal   Collection Time: 12/30/20  8:46 AM  Result Value Ref Range   Cholesterol, Total 145 100 - 199 mg/dL   Triglycerides 310 (H) 0 - 149 mg/dL   HDL 40 >39 mg/dL   VLDL Cholesterol Cal 48 (H) 5 - 40 mg/dL   LDL Chol Calc (NIH) 57 0 - 99 mg/dL   LDL/HDL Ratio 1.4 0.0 - 3.2 ratio    Comment:                                     LDL/HDL Ratio                                             Men  Women                               1/2 Avg.Risk  1.0    1.5                                   Avg.Risk  3.6    3.2                                2X Avg.Risk  6.2    5.0  3X Avg.Risk  8.0    6.1   Iron, TIBC and Ferritin Panel     Status: None   Collection Time: 12/30/20  8:46 AM  Result Value Ref Range   Total Iron Binding Capacity 293 250 - 450 ug/dL   UIBC 209 118 - 369 ug/dL   Iron 84 27 - 139 ug/dL   Iron Saturation 29 15 - 55 %   Ferritin 103 15 - 150 ng/mL  UA/M w/rflx Culture, Routine     Status: None (Preliminary result)   Collection Time: 01/30/21  3:28 PM   Specimen: Urine   Urine  Result Value Ref Range   Specific Gravity, UA 1.006 1.005 - 1.030   pH, UA 6.5 5.0 - 7.5   Color, UA Yellow Yellow   Appearance Ur Clear Clear   Leukocytes,UA Negative Negative   Protein,UA Negative Negative/Trace   Glucose, UA Negative Negative   Ketones, UA Negative Negative   RBC, UA Negative Negative   Bilirubin, UA Negative Negative   Urobilinogen, Ur 0.2 0.2 - 1.0 mg/dL   Nitrite, UA Negative Negative   Microscopic Examination Comment     Comment: Microscopic follows if indicated.   Microscopic Examination See below:     Comment: Microscopic was indicated and was performed.   Urinalysis Reflex Comment     Comment: This specimen has reflexed to a Urine Culture.  Microscopic Examination     Status: Abnormal   Collection Time: 01/30/21  3:28 PM   Urine  Result Value Ref Range   WBC, UA 0-5 0 - 5 /hpf   RBC 0-2 0 - 2 /hpf   Epithelial  Cells (non renal) 0-10 0 - 10 /hpf   Casts None seen None seen /lpf   Bacteria, UA Few (A) None seen/Few  Urine Culture, Reflex     Status: None (Preliminary result)   Collection Time: 01/30/21  3:28 PM   Urine  Result Value Ref Range   Urine Culture, Routine WILL FOLLOW     .   Assessment/Plan: 1. Encounter for general adult medical examination with abnormal findings CPE performed, labs reviewed at prior visit, patient declines mammography but is willing to have colonoscopy and referral was placed.  Patient also up-to-date on bone density  2. OSA (obstructive sleep apnea) Patient will be started on APAP 11-17 cm H2O - For home use only DME continuous positive airway pressure (CPAP)  3. Age-related osteoporosis without current pathological fracture Patient will start on Boniva once monthly, given instructions on how to take this medication and patient will call if unable to tolerate - ibandronate (BONIVA) 150 MG tablet; Take in the morning with a full glass of water, on an empty stomach, and do not take anything else by mouth or lie down for the next 30 min.  Dispense: 3 tablet; Refill: 3  4. GAD (generalized anxiety disorder) Stable, continue current medications  5. Screening for colorectal cancer - Ambulatory referral to Gastroenterology  6. New onset a-fib [facility]) Followed by cardiology, on Eliquis  7. Diarrhea, unspecified type Intermittent for a long time, will refer to GI for evaluation and colonoscopy - Ambulatory referral to Gastroenterology  8. Dysuria - UA/M w/rflx Culture, Routine   General Counseling: [place] verbalizes understanding of the findings of todays visit and agrees with plan of treatment. I have discussed any further diagnostic evaluation that may be needed or ordered today. We also reviewed her medications today. she has been encouraged to call the office with any questions  or concerns that should arise related to todays  visit.    Counseling:    Orders Placed This Encounter  Procedures   For home use only DME continuous positive airway pressure (CPAP)   Microscopic Examination   Urine Culture, Reflex   UA/M w/rflx Culture, Routine   Ambulatory referral to Gastroenterology    Meds ordered this encounter  Medications   ibandronate (BONIVA) 150 MG tablet    Sig: Take in the morning with a full glass of water, on an empty stomach, and do not take anything else by mouth or lie down for the next 30 min.    Dispense:  3 tablet    Refill:  3    This patient was seen by Drema Dallas, PA-C in collaboration with Dr. Clayborn Bigness as a part of collaborative care agreement.  Total time spent:35 Minutes  Time spent includes review of chart, medications, test results, and follow up plan with the patient.     [name], MD  Internal Medicine

## 2021-01-31 ENCOUNTER — Other Ambulatory Visit: Payer: Self-pay

## 2021-01-31 ENCOUNTER — Other Ambulatory Visit (INDEPENDENT_AMBULATORY_CARE_PROVIDER_SITE_OTHER): Payer: Self-pay | Admitting: Nurse Practitioner

## 2021-01-31 DIAGNOSIS — M810 Age-related osteoporosis without current pathological fracture: Secondary | ICD-10-CM

## 2021-01-31 DIAGNOSIS — Z9889 Other specified postprocedural states: Secondary | ICD-10-CM

## 2021-01-31 LAB — CUP PACEART REMOTE DEVICE CHECK
Date Time Interrogation Session: 20220913143810
Implantable Pulse Generator Implant Date: 20220330

## 2021-01-31 MED ORDER — IBANDRONATE SODIUM 150 MG PO TABS: ORAL_TABLET | ORAL | 3 refills | Status: DC

## 2021-02-03 ENCOUNTER — Ambulatory Visit (INDEPENDENT_AMBULATORY_CARE_PROVIDER_SITE_OTHER): Payer: Medicare Other

## 2021-02-03 ENCOUNTER — Encounter (INDEPENDENT_AMBULATORY_CARE_PROVIDER_SITE_OTHER): Payer: Self-pay | Admitting: Vascular Surgery

## 2021-02-03 ENCOUNTER — Ambulatory Visit (INDEPENDENT_AMBULATORY_CARE_PROVIDER_SITE_OTHER): Payer: Medicare Other | Admitting: Vascular Surgery

## 2021-02-03 ENCOUNTER — Other Ambulatory Visit: Payer: Self-pay

## 2021-02-03 VITALS — BP 147/79 | HR 52 | Resp 16 | Wt 158.6 lb

## 2021-02-03 DIAGNOSIS — I10 Essential (primary) hypertension: Secondary | ICD-10-CM | POA: Diagnosis not present

## 2021-02-03 DIAGNOSIS — Z9889 Other specified postprocedural states: Secondary | ICD-10-CM

## 2021-02-03 DIAGNOSIS — I739 Peripheral vascular disease, unspecified: Secondary | ICD-10-CM | POA: Diagnosis not present

## 2021-02-03 DIAGNOSIS — E785 Hyperlipidemia, unspecified: Secondary | ICD-10-CM | POA: Diagnosis not present

## 2021-02-03 DIAGNOSIS — I70213 Atherosclerosis of native arteries of extremities with intermittent claudication, bilateral legs: Secondary | ICD-10-CM

## 2021-02-03 NOTE — Progress Notes (Signed)
Carelink Summary Report / Loop Recorder 

## 2021-02-03 NOTE — Progress Notes (Signed)
MRN : [id number]  Sarah Barnes is a 70 y.o. (August 13, 1950) female who presents with chief complaint of  Chief Complaint  Patient presents with   Follow-up    Ultrasound follow up  .  History of Present Illness: Patient returns today in follow up of her PAD.  She is about 22-monthstatus post staged bilateral lower extremity revascularizations for short distance claudication.  This has resulted in significant improvement in her pain in her legs.  Her walking distances are much better.  She is doing well and has no specific complaints today related to her PAD.  ABIs today are 1.03 on the right and 1.06 on the left with multiphasic waveforms and digital pressures of greater than 100 bilaterally.  Current Outpatient Medications  Medication Sig Dispense Refill   apixaban (ELIQUIS) 5 MG TABS tablet Take 1 tablet (5 mg total) by mouth 2 (two) times daily. 180 tablet 3   atorvastatin (LIPITOR) 40 MG tablet Take 1 tablet (40 mg total) by mouth daily. 90 tablet 1   buPROPion (WELLBUTRIN XL) 150 MG 24 hr tablet TAKE 1 TABLET BY MOUTH  DAILY 90 tablet 3   busPIRone (BUSPAR) 15 MG tablet TAKE 1 TABLET BY MOUTH  TWICE DAILY 180 tablet 0   carvedilol (COREG) 12.5 MG tablet Take by mouth 2 (two) times daily.     celecoxib (CELEBREX) 100 MG capsule Take 1 capsule (100 mg total) by mouth daily. 90 capsule 0   cilostazol (PLETAL) 50 MG tablet TAKE 1 TABLET BY MOUTH  TWICE DAILY 180 tablet 3   clopidogrel (PLAVIX) 75 MG tablet Take 1 tablet (75 mg total) by mouth daily. 30 tablet 11   escitalopram (LEXAPRO) 10 MG tablet TAKE 1 TABLET BY MOUTH  DAILY 90 tablet 1   fenofibrate micronized (LOFIBRA) 134 MG capsule Take 1 capsule (134 mg total) by mouth daily before breakfast. NEEDS APPOINTMENT FOR FUTURE REFILLS 15 capsule 0   folic acid (FOLVITE) 1 MG tablet Take 1 tablet (1 mg total) by mouth daily. 30 tablet 0   furosemide (LASIX) 40 MG tablet TAKE 1 TABLET BY MOUTH  DAILY 90 tablet 3   gabapentin  (NEURONTIN) 100 MG capsule TAKE 2 CAPSULES BY MOUTH IN THE MORNING, 1 CAPSULE IN  THE AFTERNOON AND 2  CAPSULES AT NIGHT 450 capsule 3   hydrOXYzine (VISTARIL) 25 MG capsule Take 1 capsule (25 mg total) by mouth every 8 (eight) hours as needed. 90 capsule 1   ibandronate (BONIVA) 150 MG tablet Take one  tab po once a month  in the morning with a full glass of water, on an empty stomach, and do not take anything else by mouth or lie down for the next 30 min. 3 tablet 3   meclizine (ANTIVERT) 25 MG tablet Take 1 tablet (25 mg total) by mouth 3 (three) times daily as needed for dizziness. 30 tablet 0   Multiple Vitamin (MULTIVITAMIN WITH MINERALS) TABS tablet Take 1 tablet by mouth daily.     pantoprazole (PROTONIX) 40 MG tablet Take 1 tablet (40 mg total) by mouth daily. NEEDS APPOINTMENT FOR FUTURE REFILLS 90 tablet 1   Potassium 99 MG TABS Take by mouth.     tiZANidine (ZANAFLEX) 2 MG tablet TAKE 1 TABLET BY MOUTH  EVERY 8 HOURS AS NEEDED FOR MUSCLE SPASM(S) 270 tablet 1   traMADol (ULTRAM) 50 MG tablet Take 50-100 mg by mouth every 6 (six) hours as needed. for pain     traZODone (  DESYREL) 100 MG tablet TAKE 1 TABLET BY MOUTH  DAILY AT BEDTIME 90 tablet 3   valsartan (DIOVAN) 160 MG tablet TAKE 1 TABLET BY MOUTH  DAILY 90 tablet 3   No current facility-administered medications for this visit.    Past Medical History:  Diagnosis Date   Agoraphobia with panic disorder    Anemia    Arthritis    "all over" (08/13/2016)   Chronic bronchitis (HCC)    COPD (chronic obstructive pulmonary disease) ([place])    Cryptogenic stroke ([place])    a. 06/2020 Head CT: low density caudate nucleus concerning for infarct; b. 07/2020 s/p MDT Linq; c. 11/2020 Finding of Afib on Linq.   Depression    Dyspnea    occ   Dysrhythmia    palpitations occ due to anxiety   ETOH abuse    GAD (generalized anxiety disorder)    GERD (gastroesophageal reflux disease)    Headache    "daily til I got glasses; now have headache  once in awhile" (08/13/2016)   Hepatitis 1960   "isolated &  hospitalized for 2 weeks; don't know which kind of hepatitis"    Hernia, hiatal    High cholesterol    Hypertension    Hypertrophic obstructive cardiomyopathy (HCC)    Mild aortic stenosis    a. 01/2019 Echo: EF 55-60%. Mild AS; b. 12/2019 Echo: Nl EF. Mild diast dysfxn. Trace AS/AI/TR. Mild MR.   Nonobstructive CAD (coronary artery disease)    a. 12/2016 Cath: LM nl, LAD min irregs, LCX nl, OM1/2 min irregs, RCA nl, RPDA min irregs. EF 55-65%.   PAD (peripheral artery disease) ([place])    a. 09/29/2020 s/p PTA/DBA to L prox/mid SFA and stenting of L SFA; b. 10/06/2020 PTA of R TP trunk and prox peroenal. PTA/DBA of R SFA and prox R Popliteal. R SFA stenting x 2; c. 10/2020 ABI: R 0.92, L 0.93.   PAF (paroxysmal atrial fibrillation) ([place])    a.11/2020 PAF noted on Linq; b. CHA2DS2VASc = 6-->eliquis.   Pneumonia    "once or twice" (08/13/2016)   PONV (postoperative nausea and vomiting)    Sciatica    Tobacco abuse     Past Surgical History:  Procedure Laterality Date   ANAL FISTULECTOMY  1970s X 3   BACK SURGERY     HERNIA REPAIR     KNEE ARTHROSCOPY Right    LAPAROSCOPIC CHOLECYSTECTOMY     LAPAROSCOPIC INCISIONAL / UMBILICAL / VENTRAL HERNIA REPAIR  08/13/2016   VHR w/mesh   LOWER EXTREMITY ANGIOGRAPHY Left 09/29/2020   Procedure: LOWER EXTREMITY ANGIOGRAPHY;  Surgeon: Algernon Huxley, MD;  Location: [facility];  Service: Cardiovascular;  Laterality: Left;   LOWER EXTREMITY ANGIOGRAPHY Right 10/06/2020   Procedure: LOWER EXTREMITY ANGIOGRAPHY;  Surgeon: Algernon Huxley, MD;  Location: [facility];  Service: Cardiovascular;  Laterality: Right;   LUMBAR DISC SURGERY     "herniated disc"   NASAL FRACTURE SURGERY  1970s X 2   RIGHT/LEFT HEART CATH AND CORONARY ANGIOGRAPHY N/A 12/27/2016   Procedure: RIGHT/LEFT HEART CATH AND CORONARY ANGIOGRAPHY;  Surgeon: Leonie Man, MD;  Location: [facility]: Mild-moderate  pulmonary hypertension.  Hyperdynamic ventricle noted.  EF 55-65% -hyperdynamic (unable to measure LVOT gradient).  Mildly elevated very tortuous but angiographically normal coronary arteries, suggesting hypertensive heart disease   SHOULDER ARTHROSCOPY WITH ROTATOR CUFF REPAIR Right 1990s?   TONSILLECTOMY  1951   TRANSTHORACIC ECHOCARDIOGRAM  12/2016  EF 60-65% with dynamic outflow tract obstruction at rest. Peak gradient 52 mmHg consistent with HOCM.  GRII DD area. Aortic sclerosis but no stenosis. Systolic anterior motion of mitral valve chordae. Mild mitral stenosis. Moderate LA dilation and mild RA dilation. Peak PA pressures 35 mmHg.   VENTRAL HERNIA REPAIR N/A 08/13/2016   Procedure: LAPAROSCOPIC VENTRAL HERNIA REPAIR WITH MESH;  Surgeon: Judeth Horn, MD;  Location: [facility];  Service: General;  Laterality: N/A;     Social History   Tobacco Use   Smoking status: Every Day    Packs/day: 0.50    Years: 49.00    Pack years: 24.50    Types: Cigarettes   Smokeless tobacco: Never  Substance Use Topics   Alcohol use: Not Currently    Alcohol/week: 84.0 standard drinks    Types: 36 Cans of beer, 48 Shots of liquor per week    Comment: occ   Drug use: Not Currently    Frequency: 1.0 times per week    Types: Marijuana, Cocaine    Comment: Cocaine + March 2017       Family History  Problem Relation Age of Onset   Diabetes Mother    Hyperlipidemia Mother    Hypertension Mother    Heart attack Mother 50   Angina Mother        chronic problem   Hypertension Father    Stroke Father    Cancer Brother      No Known Allergies  REVIEW OF SYSTEMS (Negative unless checked)   Constitutional: []Weight loss  []Fever  []Chills Cardiac: []Chest pain   []Chest pressure   []Palpitations   []Shortness of breath when laying flat   []Shortness of breath at rest   []Shortness of breath with exertion. Vascular:  [x]Pain in legs with walking   [x]Pain in legs at rest   []Pain in legs when  laying flat   [x]Claudication   []Pain in feet when walking  []Pain in feet at rest  []Pain in feet when laying flat   []History of DVT   []Phlebitis   []Swelling in legs   []Varicose veins   []Non-healing ulcers Pulmonary:   []Uses home oxygen   []Productive cough   []Hemoptysis   []Wheeze  []COPD   []Asthma Neurologic:  []Dizziness  []Blackouts   []Seizures   []History of stroke   []History of TIA  []Aphasia   []Temporary blindness   []Dysphagia   []Weakness or numbness in arms   []Weakness or numbness in legs Musculoskeletal:  [x]Arthritis   []Joint swelling   [x]Joint pain   []Low back pain Hematologic:  []Easy bruising  []Easy bleeding   []Hypercoagulable state   []Anemic  []Hepatitis Gastrointestinal:  []Blood in stool   []Vomiting blood  []Gastroesophageal reflux/heartburn   []Abdominal pain Genitourinary:  []Chronic kidney disease   []Difficult urination  []Frequent urination  []Burning with urination   []Hematuria Skin:  []Rashes   []Ulcers   []Wounds Psychological:  []History of anxiety   [] History of major depression.  Physical Examination  BP (!) 147/79 (BP Location: Right Arm)   Pulse (!) 52   Resp 16   Wt 158 lb 9.6 oz (71.9 kg)   BMI 28.09 kg/m  Gen:  WD/WN, NAD Head: [place]/AT, No temporalis wasting. Ear/Nose/Throat: Hearing grossly intact, nares w/o erythema or drainage Eyes: Conjunctiva clear. Sclera non-icteric Neck: Supple.  Trachea midline Pulmonary:  Good air movement, no use of accessory muscles.  Cardiac: RRR, no JVD Vascular:  Vessel  Right Left  Radial Palpable Palpable                          PT Palpable Palpable  DP Palpable Palpable   Gastrointestinal: soft, non-tender/non-distended. No guarding/reflex.  Musculoskeletal: M/S 5/5 throughout.  No deformity or atrophy. No edema. Neurologic: Sensation grossly intact in extremities.  Symmetrical.  Speech is fluent.  Psychiatric: Judgment intact, Mood & affect appropriate for pt's clinical  situation. Dermatologic: No rashes or ulcers noted.  No cellulitis or open wounds.      Labs Recent Results (from the past 2160 hour(s))  CUP PACEART REMOTE DEVICE CHECK     Status: None   Collection Time: 11/26/20  2:34 PM  Result Value Ref Range   Date Time Interrogation Session [id number]    Pulse Generator Manufacturer MERM    Pulse Gen Model LNQ22 LINQ II    Pulse Gen Serial Number [id number]    Clinic [facility]    Implantable Pulse Generator Type ICM/ILR    Implantable Pulse Generator Implant Date [id number]    Eval Rhythm SR at 70 bpm   CUP PACEART REMOTE DEVICE CHECK     Status: None   Collection Time: 12/29/20  2:38 PM  Result Value Ref Range   Date Time Interrogation Session [id number]    Pulse Generator Manufacturer MERM    Pulse Gen Model LNQ22 LINQ II    Pulse Gen Serial Number [id number]    [facility]    Implantable Pulse Generator Type ICM/ILR    Implantable Pulse Generator Implant Date [id number]   CBC w/Diff/Platelet     Status: Abnormal   Collection Time: 12/30/20  8:46 AM  Result Value Ref Range   WBC 13.0 (H) 3.4 - 10.8 x10E3/uL   RBC 5.22 3.77 - 5.28 x10E6/uL   Hemoglobin 16.3 (H) 11.1 - 15.9 g/dL   Hematocrit 47.2 (H) 34.0 - 46.6 %   MCV 90 79 - 97 fL   MCH 31.2 26.6 - 33.0 pg   MCHC 34.5 31.5 - 35.7 g/dL   RDW 12.8 11.7 - 15.4 %   Platelets 229 150 - 450 x10E3/uL   Neutrophils 45 Not Estab. %    Comment: Occasional metamyelocyte seen on scan.   Lymphs 45 Not Estab. %   Monocytes 7 Not Estab. %   Eos 2 Not Estab. %   Basos 1 Not Estab. %   Neutrophils Absolute 6.0 1.4 - 7.0 x10E3/uL   Lymphocytes Absolute 5.8 (H) 0.7 - 3.1 x10E3/uL   Monocytes Absolute 0.9 0.1 - 0.9 x10E3/uL   EOS (ABSOLUTE) 0.2 0.0 - 0.4 x10E3/uL   Basophils Absolute 0.1 0.0 - 0.2 x10E3/uL   Immature Granulocytes 0 Not Estab. %   Immature Grans (Abs) 0.0 0.0 - 0.1 x10E3/uL   Hematology Comments: Note:     Comment: Verified by microscopic  examination.  Comprehensive metabolic panel     Status: Abnormal   Collection Time: 12/30/20  8:46 AM  Result Value Ref Range   Glucose 111 (H) 65 - 99 mg/dL   BUN 15 8 - 27 mg/dL   Creatinine, Ser 0.89 0.57 - 1.00 mg/dL   eGFR 69 >59 mL/min/1.73   BUN/Creatinine Ratio 17 12 - 28   Sodium 141 134 - 144 mmol/L   Potassium 3.8 3.5 - 5.2 mmol/L   Chloride 103 96 - 106 mmol/L   CO2 23 20 - 29 mmol/L   Calcium 10.0 8.7 - 10.3  mg/dL   Total Protein 6.8 6.0 - 8.5 g/dL   Albumin 4.6 3.7 - 4.7 g/dL   Globulin, Total 2.2 1.5 - 4.5 g/dL   Albumin/Globulin Ratio 2.1 1.2 - 2.2   Bilirubin Total 0.8 0.0 - 1.2 mg/dL   Alkaline Phosphatase 75 44 - 121 IU/L   AST 13 0 - 40 IU/L   ALT 9 0 - 32 IU/L  B12 and Folate Panel     Status: None   Collection Time: 12/30/20  8:46 AM  Result Value Ref Range   Vitamin B-12 507 232 - 1,245 pg/mL   Folate >20.0 >3.0 ng/mL    Comment: A serum folate concentration of less than 3.1 ng/mL is considered to represent clinical deficiency.   TSH + free T4     Status: None   Collection Time: 12/30/20  8:46 AM  Result Value Ref Range   TSH 1.890 0.450 - 4.500 uIU/mL   Free T4 1.49 0.82 - 1.77 ng/dL  Lipid Panel With LDL/HDL Ratio     Status: Abnormal   Collection Time: 12/30/20  8:46 AM  Result Value Ref Range   Cholesterol, Total 145 100 - 199 mg/dL   Triglycerides 310 (H) 0 - 149 mg/dL   HDL 40 >39 mg/dL   VLDL Cholesterol Cal 48 (H) 5 - 40 mg/dL   LDL Chol Calc (NIH) 57 0 - 99 mg/dL   LDL/HDL Ratio 1.4 0.0 - 3.2 ratio    Comment:                                     LDL/HDL Ratio                                             Men  Women                               1/2 Avg.Risk  1.0    1.5                                   Avg.Risk  3.6    3.2                                2X Avg.Risk  6.2    5.0                                3X Avg.Risk  8.0    6.1   Iron, TIBC and Ferritin Panel     Status: None   Collection Time: 12/30/20  8:46 AM  Result Value Ref  Range   Total Iron Binding Capacity 293 250 - 450 ug/dL   UIBC 209 118 - 369 ug/dL   Iron 84 27 - 139 ug/dL   Iron Saturation 29 15 - 55 %   Ferritin 103 15 - 150 ng/mL  UA/M w/rflx Culture, Routine     Status: None (Preliminary result)   Collection Time: 01/30/21  3:28 PM   Specimen: Urine   Urine  Result Value Ref Range  Specific Gravity, UA 1.006 1.005 - 1.030   pH, UA 6.5 5.0 - 7.5   Color, UA Yellow Yellow   Appearance Ur Clear Clear   Leukocytes,UA Negative Negative   Protein,UA Negative Negative/Trace   Glucose, UA Negative Negative   Ketones, UA Negative Negative   RBC, UA Negative Negative   Bilirubin, UA Negative Negative   Urobilinogen, Ur 0.2 0.2 - 1.0 mg/dL   Nitrite, UA Negative Negative   Microscopic Examination Comment     Comment: Microscopic follows if indicated.   Microscopic Examination See below:     Comment: Microscopic was indicated and was performed.   Urinalysis Reflex Comment     Comment: This specimen has reflexed to a Urine Culture.  Microscopic Examination     Status: Abnormal   Collection Time: 01/30/21  3:28 PM   Urine  Result Value Ref Range   WBC, UA 0-5 0 - 5 /hpf   RBC 0-2 0 - 2 /hpf   Epithelial Cells (non renal) 0-10 0 - 10 /hpf   Casts None seen None seen /lpf   Bacteria, UA Few (A) None seen/Few  Urine Culture, Reflex     Status: None (Preliminary result)   Collection Time: 01/30/21  3:28 PM   Urine  Result Value Ref Range   Urine Culture, Routine WILL FOLLOW   CUP PACEART REMOTE DEVICE CHECK     Status: None   Collection Time: 01/31/21  2:38 PM  Result Value Ref Range   Date Time Interrogation Session [id number]    Pulse Generator Manufacturer MERM    Pulse Gen Model LNQ22 LINQ II    Pulse Gen Serial Number [id number]    Clinic Name [facility] Pulse Generator Type ICM/ILR    Implantable Pulse Generator Implant Date [id number]     Radiology DG Bone Density  Result Date: 01/19/2021 EXAM: DUAL X-RAY  ABSORPTIOMETRY (DXA) FOR BONE MINERAL DENSITY IMPRESSION: Your patient Moksha Dorgan completed a BMD test on 01/19/2021 using the Raymond (software version: 14.10) manufactured by [employer]. The following summarizes the results of our evaluation. Technologist: [facility] PATIENT BIOGRAPHICAL: Name: Kanylah, Muench Patient ID: [id number] Birth Date: August 05, 1950 Height: 63.0 in. Gender: Female Exam Date: 01/19/2021 Weight: 159.2 lbs. Indications: Advanced Age, Caucasian, Postmenopausal Fractures: Treatments: DENSITOMETRY RESULTS: Site      Region      Measured Date Measured Age WHO Classification Young Adult T-score BMD         %Change vs. Previous Significant Change (*) AP Spine L1-L2 01/19/20[age].3 Osteopenia -2.1 0.914 g/cm2 DualFemur Total Right 01/19/2021 72.3 Osteoporosis -2.5 0.697 g/cm2 ASSESSMENT: The BMD measured at Femur Total Right is 0.697 g/cm2 with a T-score of -2.5. This patient is considered osteoporotic according to [facility]) criteria. The scan quality is good. L-3 & 4 was excluded due to degenerative changes. World [employer]) criteria for post-menopausal, Caucasian Women: Normal:                   T-score at or above -1 SD Osteopenia/low bone mass: T-score between -1 and -2.5 SD Osteoporosis:             T-score at or below -2.5 SD RECOMMENDATIONS: 1. All patients should optimize calcium and vitamin D intake. 2. Consider FDA-approved medical therapies in postmenopausal women and men aged 7 years and older, based on the following: a. A hip or vertebral(clinical or morphometric) fracture b. T-score < -2.5  at the femoral neck or spine after appropriate evaluation to exclude secondary causes c. Low bone mass (T-score between -1.0 and -2.5 at the femoral neck or spine) and a 10-year probability of a hip fracture > 3% or a 10-year probability of a major osteoporosis-related fracture > 20% based on the [place]-adapted WHO algorithm 3. Clinician  judgment and/or patient preferences may indicate treatment for people with 10-year fracture probabilities above or below these levels FOLLOW-UP: People with diagnosed cases of osteoporosis or at high risk for fracture should have regular bone mineral density tests. For patients eligible for Medicare, routine testing is allowed once every 2 years. The testing frequency can be increased to one year for patients who have rapidly progressing disease, those who are receiving or discontinuing medical therapy to restore bone mass, or have additional risk factors. I have reviewed this report, and agree with the above findings. [facility] Electronically Signed   By: Rolm Baptise M.D.   On: 01/19/2021 14:59   CUP PACEART REMOTE DEVICE CHECK  Result Date: 01/31/2021 ILR summary report received. Battery status OK. Normal device function. No new symptom, tachy, brady, or pause episodes. AF burden 9.5%, CVR, +OAC Monthly summary reports and ROV/PRN LR   Assessment/Plan Essential hypertension blood pressure control important in reducing the progression of atherosclerotic disease. On appropriate oral medications.     HLD (hyperlipidemia) lipid control important in reducing the progression of atherosclerotic disease. Continue statin therapy  Atherosclerosis of native arteries of extremity with intermittent claudication (HCC) ABIs today are 1.03 on the right and 1.06 on the left with multiphasic waveforms and normal digital pressures bilaterally.  Symptoms are markedly improved after revascularization about 4 months ago.  Doing well.  Continue current medical regimen.  Recheck in 6 months.    Leotis Pain, MD  02/03/2021 12:47 PM    This note was created with Dragon medical transcription system.  Any errors from dictation are purely unintentional

## 2021-02-03 NOTE — Assessment & Plan Note (Signed)
ABIs today are 1.03 on the right and 1.06 on the left with multiphasic waveforms and normal digital pressures bilaterally.  Symptoms are markedly improved after revascularization about 4 months ago.  Doing well.  Continue current medical regimen.  Recheck in 6 months.

## 2021-02-05 ENCOUNTER — Other Ambulatory Visit: Payer: Self-pay | Admitting: Physician Assistant

## 2021-02-05 DIAGNOSIS — M064 Inflammatory polyarthropathy: Secondary | ICD-10-CM

## 2021-02-05 LAB — UA/M W/RFLX CULTURE, ROUTINE
Bilirubin, UA: NEGATIVE
Glucose, UA: NEGATIVE
Ketones, UA: NEGATIVE
Leukocytes,UA: NEGATIVE
Nitrite, UA: NEGATIVE
Protein,UA: NEGATIVE
RBC, UA: NEGATIVE
Specific Gravity, UA: 1.006 (ref 1.005–1.030)
Urobilinogen, Ur: 0.2 mg/dL (ref 0.2–1.0)
pH, UA: 6.5 (ref 5.0–7.5)

## 2021-02-05 LAB — MICROSCOPIC EXAMINATION: Casts: NONE SEEN /lpf

## 2021-02-05 LAB — URINE CULTURE, REFLEX

## 2021-02-08 ENCOUNTER — Other Ambulatory Visit: Payer: Self-pay | Admitting: Physician Assistant

## 2021-02-08 ENCOUNTER — Telehealth: Payer: Self-pay

## 2021-02-08 DIAGNOSIS — N3000 Acute cystitis without hematuria: Secondary | ICD-10-CM

## 2021-02-08 MED ORDER — NITROFURANTOIN MONOHYD MACRO 100 MG PO CAPS: ORAL_CAPSULE | ORAL | 0 refills | Status: DC

## 2021-02-08 NOTE — Telephone Encounter (Signed)
-----   Message from Mylinda Latina, PA-C sent at 02/08/2021  1:05 PM EDT ----- Please let patient know she has a UTI and I am sending ABX to [facility]

## 2021-02-08 NOTE — Telephone Encounter (Signed)
Spoke to pt and informed her of lab results and that Zavala was sent to her pharmacy

## 2021-02-27 ENCOUNTER — Ambulatory Visit: Payer: Medicare Other

## 2021-03-03 ENCOUNTER — Other Ambulatory Visit: Payer: Self-pay | Admitting: Physician Assistant

## 2021-03-07 ENCOUNTER — Other Ambulatory Visit: Payer: Self-pay | Admitting: Internal Medicine

## 2021-03-09 LAB — CUP PACEART REMOTE DEVICE CHECK
Date Time Interrogation Session: 20221020075649
Implantable Pulse Generator Implant Date: 20220330

## 2021-03-30 ENCOUNTER — Ambulatory Visit (INDEPENDENT_AMBULATORY_CARE_PROVIDER_SITE_OTHER): Payer: Medicare Other | Admitting: Gastroenterology

## 2021-03-30 ENCOUNTER — Encounter: Payer: Self-pay | Admitting: Gastroenterology

## 2021-03-30 VITALS — BP 118/78 | HR 73 | Temp 97.7°F | Ht 63.0 in | Wt 160.0 lb

## 2021-03-30 DIAGNOSIS — K529 Noninfective gastroenteritis and colitis, unspecified: Secondary | ICD-10-CM | POA: Diagnosis not present

## 2021-03-30 MED ORDER — NA SULFATE-K SULFATE-MG SULF 17.5-3.13-1.6 GM/177ML PO SOLN: 1.0000 | Freq: Once | ORAL | 0 refills | Status: AC

## 2021-03-30 NOTE — Addendum Note (Signed)
Addended by: Lurlean Nanny on: 03/30/2021 03:48 PM   Modules accepted: Orders, SmartSet

## 2021-03-30 NOTE — Patient Instructions (Signed)
Please go to [facility] they have a [facility] drawing station inside, they close at [place], [place], [place] [id number]

## 2021-03-30 NOTE — Progress Notes (Signed)
[name] MD, MRCP(U.K) [address]  [place]  [place], [place] [address]  Main: [phone]  Fax: [phone]   Gastroenterology Consultation  Referring Provider:     Mylinda Latina, PA* Primary Care Physician:  [name], MD Primary Gastroenterologist:  Dr. [name]  Reason for Consultation:     Colon cancer screening and diarrhea        HPI:   ANTWANETTE WESCHE is a 70 y.o. y/o female referred for consultation & management  by Dr. Humphrey Rolls, Timoteo Gaul, MD.    She has been referred for colon cancer screening and diarrhea.  She states that she has been having diarrhea for more than 6 months.  Multiple bowel movements a day watery in nature.  Not on any medication such as metformin.  Denies any use of NSAIDs.  Denies any use of artificial sugars or sweeteners or excess consumption of sodas.  No unintentional weight loss.  No prior colonoscopy.  No blood in the stool.  No family history of colon cancer or polyps.  12/30/2020 iron studies normal, TSH normal, B12 folate normal, CMP normal except elevated glucose.  Hemoglobin 16.3 g.  Past Medical History:  Diagnosis Date   Agoraphobia with panic disorder    Anemia    Arthritis    "all over" (08/13/2016)   Chronic bronchitis (HCC)    COPD (chronic obstructive pulmonary disease) ([facility])    Cryptogenic stroke ([place])    a. 06/2020 Head CT: low density caudate nucleus concerning for infarct; b. 07/2020 s/p MDT Linq; c. 11/2020 Finding of Afib on Linq.   Depression    Dyspnea    occ   Dysrhythmia    palpitations occ due to anxiety   ETOH abuse    GAD (generalized anxiety disorder)    GERD (gastroesophageal reflux disease)    Headache    "daily til I got glasses; now have headache once in awhile" (08/13/2016)   Hepatitis 1960   "isolated &  hospitalized for 2 weeks; don't know which kind of hepatitis"    Hernia, hiatal    High cholesterol    Hypertension    Hypertrophic obstructive cardiomyopathy (HCC)    Mild aortic  stenosis    a. 01/2019 Echo: EF 55-60%. Mild AS; b. 12/2019 Echo: Nl EF. Mild diast dysfxn. Trace AS/AI/TR. Mild MR.   Nonobstructive CAD (coronary artery disease)    a. 12/2016 Cath: LM nl, LAD min irregs, LCX nl, OM1/2 min irregs, RCA nl, RPDA min irregs. EF 55-65%.   PAD (peripheral artery disease) ([facility])    a. 09/29/2020 s/p PTA/DBA to L prox/mid SFA and stenting of L SFA; b. 10/06/2020 PTA of R TP trunk and prox peroenal. PTA/DBA of R SFA and prox R Popliteal. R SFA stenting x 2; c. 10/2020 ABI: R 0.92, L 0.93.   PAF (paroxysmal atrial fibrillation) ([facility])    a.11/2020 PAF noted on Linq; b. CHA2DS2VASc = 6-->eliquis.   Pneumonia    "once or twice" (08/13/2016)   PONV (postoperative nausea and vomiting)    Sciatica    Tobacco abuse     Past Surgical History:  Procedure Laterality Date   ANAL FISTULECTOMY  1970s X 3   BACK SURGERY     HERNIA REPAIR     KNEE ARTHROSCOPY Right    LAPAROSCOPIC CHOLECYSTECTOMY     LAPAROSCOPIC INCISIONAL / UMBILICAL / VENTRAL HERNIA REPAIR  08/13/2016   VHR w/mesh   LOWER EXTREMITY ANGIOGRAPHY Left 09/29/2020  Procedure: LOWER EXTREMITY ANGIOGRAPHY;  Surgeon: Algernon Huxley, MD;  Location: [facility];  Service: Cardiovascular;  Laterality: Left;   LOWER EXTREMITY ANGIOGRAPHY Right 10/06/2020   Procedure: LOWER EXTREMITY ANGIOGRAPHY;  Surgeon: Algernon Huxley, MD;  Location: [facility];  Service: Cardiovascular;  Laterality: Right;   LUMBAR DISC SURGERY     "herniated disc"   NASAL FRACTURE SURGERY  1970s X 2   RIGHT/LEFT HEART CATH AND CORONARY ANGIOGRAPHY N/A 12/27/2016   Procedure: RIGHT/LEFT HEART CATH AND CORONARY ANGIOGRAPHY;  Surgeon: Leonie Man, MD;  Location: [facility]: Mild-moderate pulmonary hypertension.  Hyperdynamic ventricle noted.  EF 55-65% -hyperdynamic (unable to measure LVOT gradient).  Mildly elevated very tortuous but angiographically normal coronary arteries, suggesting hypertensive heart disease   SHOULDER  ARTHROSCOPY WITH ROTATOR CUFF REPAIR Right 1990s?   TONSILLECTOMY  1951   TRANSTHORACIC ECHOCARDIOGRAM  12/2016    EF 60-65% with dynamic outflow tract obstruction at rest. Peak gradient 52 mmHg consistent with HOCM.  GRII DD area. Aortic sclerosis but no stenosis. Systolic anterior motion of mitral valve chordae. Mild mitral stenosis. Moderate LA dilation and mild RA dilation. Peak PA pressures 35 mmHg.   VENTRAL HERNIA REPAIR N/A 08/13/2016   Procedure: LAPAROSCOPIC VENTRAL HERNIA REPAIR WITH MESH;  Surgeon: Judeth Horn, MD;  Location: [facility];  Service: General;  Laterality: N/A;    Prior to Admission medications   Medication Sig Start Date End Date Taking? Authorizing Provider  apixaban (ELIQUIS) 5 MG TABS tablet Take 1 tablet (5 mg total) by mouth 2 (two) times daily. 12/09/20   Theora Gianotti, NP  atorvastatin (LIPITOR) 40 MG tablet Take 1 tablet (40 mg total) by mouth daily. 12/02/20 02/03/21  [name], MD  buPROPion (WELLBUTRIN XL) 150 MG 24 hr tablet TAKE 1 TABLET BY MOUTH  DAILY 09/06/20   McDonough, Si Gaul, PA-C  busPIRone (BUSPAR) 15 MG tablet TAKE 1 TABLET BY MOUTH  TWICE DAILY 12/02/20   [name], MD  carvedilol (COREG) 12.5 MG tablet Take by mouth 2 (two) times daily. 11/19/19   [provider]  celecoxib (CELEBREX) 100 MG capsule TAKE 1 CAPSULE BY MOUTH  DAILY 02/05/21   McDonough, Si Gaul, PA-C  cilostazol (PLETAL) 50 MG tablet TAKE 1 TABLET BY MOUTH  TWICE DAILY 10/09/20   [name], MD  clopidogrel (PLAVIX) 75 MG tablet Take 1 tablet (75 mg total) by mouth daily. 09/29/20   Algernon Huxley, MD  escitalopram (LEXAPRO) 10 MG tablet TAKE 1 TABLET BY MOUTH  DAILY 01/27/21   McDonough, Si Gaul, PA-C  fenofibrate micronized (LOFIBRA) 134 MG capsule Take 1 capsule (134 mg total) by mouth daily before breakfast. NEEDS APPOINTMENT FOR FUTURE REFILLS 06/20/18   Leonie Man, MD  folic acid (FOLVITE) 1 MG tablet Take 1 tablet (1 mg total) by mouth daily. 01/28/19    Demetrios Loll, MD  furosemide (LASIX) 40 MG tablet TAKE 1 TABLET BY MOUTH  DAILY 09/06/20   McDonough, Si Gaul, PA-C  gabapentin (NEURONTIN) 100 MG capsule TAKE 2 CAPSULES BY MOUTH IN THE MORNING, 1 CAPSULE IN  THE AFTERNOON AND 2  CAPSULES AT NIGHT 11/22/20   [name], MD  hydrOXYzine (VISTARIL) 25 MG capsule TAKE 1 CAPSULE BY MOUTH  EVERY 8 HOURS AS NEEDED 03/03/21   McDonough, Si Gaul, PA-C  ibandronate (BONIVA) 150 MG tablet Take one  tab po once a month  in the morning with a full  glass of water, on an empty stomach, and do not take anything else by mouth or lie down for the next 30 min. 01/31/21   McDonough, Si Gaul, PA-C  meclizine (ANTIVERT) 25 MG tablet Take 1 tablet (25 mg total) by mouth 3 (three) times daily as needed for dizziness. 07/07/20   Ward, Delice Bison, DO  Multiple Vitamin (MULTIVITAMIN WITH MINERALS) TABS tablet Take 1 tablet by mouth daily. 12/29/16   Barton Dubois, MD  nitrofurantoin, macrocrystal-monohydrate, (MACROBID) 100 MG capsule Take 1 cap twice per day for 10 days. 02/08/21   McDonough, Si Gaul, PA-C  pantoprazole (PROTONIX) 40 MG tablet TAKE 1 TABLET BY MOUTH  DAILY 03/08/21   McDonough, Si Gaul, PA-C  Potassium 99 MG TABS Take by mouth.    [provider]  tiZANidine (ZANAFLEX) 2 MG tablet TAKE 1 TABLET BY MOUTH  EVERY 8 HOURS AS NEEDED FOR MUSCLE SPASM(S) 12/02/20   [name], MD  traMADol (ULTRAM) 50 MG tablet Take 50-100 mg by mouth every 6 (six) hours as needed. for pain 10/19/20   [provider]  traZODone (DESYREL) 100 MG tablet TAKE 1 TABLET BY MOUTH  DAILY AT BEDTIME 08/25/20   [name], MD  valsartan (DIOVAN) 160 MG tablet TAKE 1 TABLET BY MOUTH  DAILY 11/21/20   [name], MD    Family History  Problem Relation Age of Onset   Diabetes Mother    Hyperlipidemia Mother    Hypertension Mother    Heart attack Mother 45   Angina Mother        chronic problem   Hypertension Father    Stroke Father    Cancer Brother      Social  History   Tobacco Use   Smoking status: Every Day    Packs/day: 0.50    Years: 49.00    Pack years: 24.50    Types: Cigarettes   Smokeless tobacco: Never  Substance Use Topics   Alcohol use: Not Currently    Alcohol/week: 84.0 standard drinks    Types: 36 Cans of beer, 48 Shots of liquor per week    Comment: occ   Drug use: Not Currently    Frequency: 1.0 times per week    Types: Marijuana, Cocaine    Comment: Cocaine + March 2017    Allergies as of 03/30/2021   (No Known Allergies)    Review of Systems:    All systems reviewed and negative except where noted in HPI.   Physical Exam:  BP 118/78   Pulse 73   Temp 97.7 F (36.5 C) (Oral)   Ht 5\' 3"  (1.6 m)   Wt 160 lb (72.6 kg)   BMI 28.34 kg/m  No LMP recorded. Patient is postmenopausal. Psych:  Alert and cooperative. Normal mood and affect. General:   Alert,  Well-developed, well-nourished, pleasant and cooperative in NAD Head:  Normocephalic and atraumatic. Eyes:  Sclera clear, no icterus.   Conjunctiva pink. Lungs:  Respirations even and unlabored.  Clear throughout to auscultation.   No wheezes, crackles, or rhonchi. No acute distress. Heart:  Regular rate and rhythm; no murmurs, clicks, rubs, or gallops. Abdomen:  Normal bowel sounds.  No bruits.  Soft, non-tender and non-distended without masses, hepatosplenomegaly or hernias noted.  No guarding or rebound tenderness.    Neurologic:  Alert and oriented x3;  grossly normal neurologically. Psych:  Alert and cooperative. Normal mood and affect.  Imaging Studies: CUP PACEART REMOTE DEVICE CHECK  Result Date:  03/09/2021 ILR summary report received. Battery status OK. Normal device function. No new symptom, tachy, brady, or pause episodes. 19 AF episodes all from 10/14 & 10/17 duration 2-10min, CVR Burden 0.2%, PVC's 0.5%, Eliquis, Coreg prescribed 1 pause event, no EGM Monthly summary reports and ROV/PRN LR   Assessment and Plan:   BRET STAMOUR is a 70 y.o.  y/o female has been referred for chronic diarrhea and colon cancer screening.  No prior colonoscopy  Plan 1.  Diagnostic colonoscopy.  She is on blood thinners will need cardiac clearance to hold them. 2.  We will perform stool test and celiac serology   I have discussed alternative options, risks & benefits,  which include, but are not limited to, bleeding, infection, perforation,respiratory complication & drug reaction.  The patient agrees with this plan & written consent will be obtained.     Follow up in 2 to 3 months  Dr [name] MD,MRCP(U.K)

## 2021-04-06 DIAGNOSIS — K529 Noninfective gastroenteritis and colitis, unspecified: Secondary | ICD-10-CM | POA: Diagnosis not present

## 2021-04-10 LAB — C DIFFICILE TOXINS A+B W/RFLX: C difficile Toxins A+B, EIA: NEGATIVE

## 2021-04-10 LAB — C DIFFICILE, CYTOTOXIN B

## 2021-04-11 ENCOUNTER — Ambulatory Visit (INDEPENDENT_AMBULATORY_CARE_PROVIDER_SITE_OTHER): Payer: Medicare Other

## 2021-04-11 DIAGNOSIS — I639 Cerebral infarction, unspecified: Secondary | ICD-10-CM | POA: Diagnosis not present

## 2021-04-11 LAB — GI PROFILE, STOOL, PCR

## 2021-04-11 LAB — CUP PACEART REMOTE DEVICE CHECK
Date Time Interrogation Session: 20221122080316
Implantable Pulse Generator Implant Date: 20220330

## 2021-04-11 LAB — CELIAC DISEASE AB SCREEN W/RFX
Antigliadin Abs, IgA: 3 units (ref 0–19)
IgA/Immunoglobulin A, Serum: 139 mg/dL (ref 64–422)
Transglutaminase IgA: <2 U/mL (ref 0–3)

## 2021-04-11 LAB — CALPROTECTIN, FECAL: Calprotectin, Fecal: 34 ug/g (ref 0–120)

## 2021-04-20 ENCOUNTER — Telehealth: Payer: Self-pay

## 2021-04-20 NOTE — Telephone Encounter (Signed)
Can start imodium and will need colonoscopy to see whats going on inside the colon

## 2021-04-20 NOTE — Telephone Encounter (Signed)
Called patient to check how she was feeling and she stated that she was still having diarrhea and a matter of fact, today she stated that she went to the restroom and had thought that she was finished with her bowel movements and went back to sleep and all of a sudden she had an accident without even noticing it. Patient wanted to know if there was something to help stop the diarrhea. Please advise.

## 2021-04-20 NOTE — Telephone Encounter (Signed)
-----   Message from [name], MD sent at 04/17/2021 10:41 AM EST ----- Inform stool test was positive for norovirus- usually resolves on own - no specific treatment- enquire how he is doing

## 2021-04-20 NOTE — Progress Notes (Signed)
Carelink Summary Report / Loop Recorder 

## 2021-04-21 ENCOUNTER — Encounter: Payer: Self-pay | Admitting: Gastroenterology

## 2021-04-24 ENCOUNTER — Encounter
Admission: RE | Disposition: A | Discharge status: Home / Self Care | Payer: Self-pay | Source: Home / Self Care | Attending: Gastroenterology

## 2021-04-24 ENCOUNTER — Other Ambulatory Visit: Payer: Self-pay

## 2021-04-24 ENCOUNTER — Telehealth: Payer: Self-pay | Admitting: Cardiology

## 2021-04-24 ENCOUNTER — Ambulatory Visit
Admission: RE | Admit: 2021-04-24 | Discharge: 2021-04-24 | Disposition: A | Discharge status: Home / Self Care | Payer: Medicare Other | Attending: Gastroenterology | Admitting: Gastroenterology

## 2021-04-24 DIAGNOSIS — Z1211 Encounter for screening for malignant neoplasm of colon: Secondary | ICD-10-CM

## 2021-04-24 DIAGNOSIS — Z538 Procedure and treatment not carried out for other reasons: Secondary | ICD-10-CM | POA: Insufficient documentation

## 2021-04-24 DIAGNOSIS — R197 Diarrhea, unspecified: Secondary | ICD-10-CM | POA: Diagnosis not present

## 2021-04-24 DIAGNOSIS — K529 Noninfective gastroenteritis and colitis, unspecified: Secondary | ICD-10-CM

## 2021-04-24 HISTORY — PX: COLONOSCOPY WITH PROPOFOL: SHX5780

## 2021-04-24 SURGERY — COLONOSCOPY WITH PROPOFOL
Anesthesia: General

## 2021-04-24 MED ORDER — SODIUM CHLORIDE 0.9 % IV SOLN: INTRAVENOUS | Status: DC

## 2021-04-24 MED ORDER — NA SULFATE-K SULFATE-MG SULF 17.5-3.13-1.6 GM/177ML PO SOLN: 354.0000 mL | Freq: Once | ORAL | 0 refills | Status: AC

## 2021-04-24 NOTE — Telephone Encounter (Signed)
Patient stated that she continued to have diarrhea, therefore, I told her that Dr. Vicente Males wanted her to start taking Imodium. Patient stated that she would have to reschedule her colonoscopy that she had scheduled for today but since she did not hold her Eliquis, she will have to reschedule it. She then greed on having her colonoscopy done on 05/18/2021.

## 2021-04-24 NOTE — Telephone Encounter (Signed)
   [facility] Pre-operative Risk Assessment    Patient Name: Sarah Barnes  DOB: Apr 25, 1951 MRN: [id number]  HEARTCARE STAFF:  - IMPORTANT!!!!!! Under Visit Info/Reason for Call, type in Other and utilize the format Clearance MM/DD/YY or Clearance TBD. Do not use dashes or single digits. - Please review there is not already an duplicate clearance open for this procedure. - If request is for dental extraction, please clarify the # of teeth to be extracted. - If the patient is currently at the dentist's office, call Pre-Op Callback Staff (MA/nurse) to input urgent request.  - If the patient is not currently in the dentist office, please route to the Pre-Op pool.  Request for surgical clearance:  What type of surgery is being performed? Colonoscopy   When is this surgery scheduled? 05/18/21  What type of clearance is required (medical clearance vs. Pharmacy clearance to hold med vs. Both)? Both   Are there any medications that need to be held prior to surgery and how long? Eliquis  Practice name and name of physician performing surgery? [facility],  What is the office phone number? [phone]   7.   What is the office fax number? [phone]  8.   Anesthesia type (None, local, MAC, general) ? Not listed   Pilar A Ham 04/24/2021, 3:17 PM  _________________________________________________________________   (provider comments below)

## 2021-04-24 NOTE — Progress Notes (Signed)
Case cancelled due to not stopping eEliquis

## 2021-04-25 ENCOUNTER — Encounter: Payer: Self-pay | Admitting: Gastroenterology

## 2021-04-26 NOTE — Telephone Encounter (Signed)
Patient with diagnosis of afib on Eliquis for anticoagulation.    Procedure: colonoscopy Date of procedure: 05/18/21   CHA2DS2-VASc Score = 7   This indicates a 11.2% annual risk of stroke. The patient's score is based upon: CHF History: 1 HTN History: 1 Diabetes History: 0 Stroke History: 2 Vascular Disease History: 1 Age Score: 1 Gender Score: 1    CrCl 54.5 ml/min  Per office protocol, patient can hold Eliquis for 1 day prior to procedure.

## 2021-04-26 NOTE — Telephone Encounter (Signed)
Left VM (could not call using Jabber)

## 2021-04-27 NOTE — Telephone Encounter (Signed)
Patient returning call.

## 2021-04-27 NOTE — Telephone Encounter (Signed)
   Name: Sarah Barnes  DOB: 03/25/1951  MRN: [id number]   Primary Cardiologist: Dr. Quentin Ore with EP  Chart reviewed as part of pre-operative protocol coverage. Patient was contacted 04/27/2021 in reference to pre-operative risk assessment for pending surgery as outlined below.  KHAMARI YOUSUF was last seen on 12/2020 by Dr. Quentin Ore. Also remotely saw Dr. Ellyn Hack. Cath 2018 with no significant CAD. Echo 12/2019 with normal LVEF, mild DD, mild MR. Now followed by Dr. Quentin Ore for hx of  PAF with prior ILR/CVA. I reached out to patient for update on how she is doing. The patient affirms she has been doing well without any new cardiac symptoms. Therefore, based on ACC/AHA guidelines, the patient would be at acceptable risk for the planned procedure without further cardiovascular testing. The patient was advised that if she develops new symptoms prior to surgery to contact our office to arrange for a follow-up visit, and she verbalized understanding.  Per office protocol, patient can hold Eliquis for 1 day prior to procedure.    Will route this bundled recommendation to requesting provider via Epic fax function. Please call with questions.  Charlie Pitter, PA-C 04/27/2021, 9:25 AM

## 2021-05-01 ENCOUNTER — Ambulatory Visit: Payer: Medicare Other | Admitting: Physician Assistant

## 2021-05-02 ENCOUNTER — Telehealth: Payer: Self-pay | Admitting: Licensed Clinical Social Worker

## 2021-05-02 ENCOUNTER — Telehealth: Payer: Self-pay

## 2021-05-02 ENCOUNTER — Telehealth: Payer: Self-pay | Admitting: Cardiology

## 2021-05-02 NOTE — Telephone Encounter (Signed)
Per review of pulmonology note-Pt was having left arm pain prior to her fall.  She should see first available [address] APP to evaluate.

## 2021-05-02 NOTE — Telephone Encounter (Signed)
Pt is having left arm pain due to a fall last weekend, pt said she hit her left  side and her left shoulder on door jam. No swelling but it is bruised [place]. Pt's PCP advised pt to call Dr. Quentin Ore and make him aware of her pain just incase she needs to bee seen.

## 2021-05-02 NOTE — Telephone Encounter (Signed)
Returned call to Pt.  She is concerned that prior to her fall she was having "excruciating" pain in her left arm.  States pain is from her elbow to her hand.  She denies SOB or swelling.  She has had leg weakness recently that resulted in a fall.  States her BP's have been good- 130/68  Will have her see APP to evaluate.  Message sent to social work to arrange transportation for Pt.

## 2021-05-02 NOTE — Telephone Encounter (Signed)
CSW received request to assist patient with transportation to an appointment at [facility] this Friday. CSW contacted patient and made arrangements for roundtrip transportation to appointment. Patient verbalizes understanding and has CSW contact information if needed. Raquel Sarna, [place], [place]

## 2021-05-02 NOTE — Telephone Encounter (Signed)
Spoke with pt she said she already have appt with cardiologist on Friday and also advised as per lauren that if her pain getting worse need  go to ED and pt agreed  and she said if her symptoms goes worse she will go to ED otherwise she will she cardiologist on Friday

## 2021-05-04 ENCOUNTER — Other Ambulatory Visit: Payer: Self-pay | Admitting: Internal Medicine

## 2021-05-05 ENCOUNTER — Other Ambulatory Visit: Payer: Self-pay

## 2021-05-05 ENCOUNTER — Encounter: Payer: Self-pay | Admitting: Nurse Practitioner

## 2021-05-05 ENCOUNTER — Ambulatory Visit (INDEPENDENT_AMBULATORY_CARE_PROVIDER_SITE_OTHER): Payer: Medicare Other | Admitting: Nurse Practitioner

## 2021-05-05 VITALS — BP 120/70 | HR 60 | Ht 63.0 in | Wt 165.0 lb

## 2021-05-05 DIAGNOSIS — M79601 Pain in right arm: Secondary | ICD-10-CM | POA: Diagnosis not present

## 2021-05-05 DIAGNOSIS — M79602 Pain in left arm: Secondary | ICD-10-CM

## 2021-05-05 DIAGNOSIS — I480 Paroxysmal atrial fibrillation: Secondary | ICD-10-CM

## 2021-05-05 DIAGNOSIS — I2510 Atherosclerotic heart disease of native coronary artery without angina pectoris: Secondary | ICD-10-CM | POA: Diagnosis not present

## 2021-05-05 DIAGNOSIS — I739 Peripheral vascular disease, unspecified: Secondary | ICD-10-CM | POA: Diagnosis not present

## 2021-05-05 DIAGNOSIS — I10 Essential (primary) hypertension: Secondary | ICD-10-CM

## 2021-05-05 DIAGNOSIS — E785 Hyperlipidemia, unspecified: Secondary | ICD-10-CM

## 2021-05-05 DIAGNOSIS — Z720 Tobacco use: Secondary | ICD-10-CM

## 2021-05-05 NOTE — Patient Instructions (Signed)
Medication Instructions:  No changes at this time.  *If you need a refill on your cardiac medications before your next appointment, please call your pharmacy*   Lab Work: None  If you have labs (blood work) drawn today and your tests are completely normal, you will receive your results only by: [place] (if you have MyChart) OR A paper copy in the mail If you have any lab test that is abnormal or we need to change your treatment, we will call you to review the results.   Testing/Procedures: None   Follow-Up: At [facility], you and your health needs are our priority.  As part of our continuing mission to provide you with exceptional heart care, we have created designated Provider Care Teams.  These Care Teams include your primary Cardiologist (physician) and Advanced Practice Providers (APPs -  Physician Assistants and Nurse Practitioners) who all work together to provide you with the care you need, when you need it.   Your next appointment:   6 month(s)  The format for your next appointment:   In Person  Provider:   Glenetta Hew, MD or Murray Hodgkins, NP

## 2021-05-05 NOTE — Progress Notes (Signed)
Office Visit    Patient Name: JENEAN ESCANDON Date of Encounter: 05/05/2021  Primary Care Provider:  [name], MD Primary Cardiologist:  Glenetta Hew, MD/EP:  Grayce Sessions, MD  Chief Complaint    70 y/o ? w/ a h/o paroxysmal atrial fibrillation, nonobs CAD, HTN, HL, tob abuse, etoh abuse, diast dysfxn, anxiety, depression, COPD, peripheral arterial disease, and cryptogenic stroke, who presents for follow-up of A. fib.  Past Medical History    Past Medical History:  Diagnosis Date   Agoraphobia with panic disorder    Anemia    Arthritis    "all over" (08/13/2016)   Chronic bronchitis (HCC)    COPD (chronic obstructive pulmonary disease) ([place])    Cryptogenic stroke ([place])    a. 06/2020 Head CT: low density caudate nucleus concerning for infarct; b. 07/2020 s/p MDT Linq; c. 11/2020 Finding of Afib on Linq.   Depression    Dyspnea    occ   Dysrhythmia    palpitations occ due to anxiety   ETOH abuse    GAD (generalized anxiety disorder)    GERD (gastroesophageal reflux disease)    Headache    "daily til I got glasses; now have headache once in awhile" (08/13/2016)   Hepatitis 1960   "isolated &  hospitalized for 2 weeks; don't know which kind of hepatitis"    Hernia, hiatal    High cholesterol    Hypertension    Hypertrophic obstructive cardiomyopathy (HCC)    Mild aortic stenosis    a. 01/2019 Echo: EF 55-60%. Mild AS; b. 12/2019 Echo: Nl EF. Mild diast dysfxn. Trace AS/AI/TR. Mild MR.   Nonobstructive CAD (coronary artery disease)    a. 12/2016 Cath: LM nl, LAD min irregs, LCX nl, OM1/2 min irregs, RCA nl, RPDA min irregs. EF 55-65%.   PAD (peripheral artery disease) ([place])    a. 09/29/2020 s/p PTA/DBA to L prox/mid SFA and stenting of L SFA; b. 10/06/2020 PTA of R TP trunk and prox peroenal. PTA/DBA of R SFA and prox R Popliteal. R SFA stenting x 2; c. 10/2020 ABI: R 0.92, L 0.93.   PAF (paroxysmal atrial fibrillation) ([place])    a.11/2020 PAF noted on Linq; b. CHA2DS2VASc =  6-->eliquis.   Pneumonia    "once or twice" (08/13/2016)   PONV (postoperative nausea and vomiting)    Sciatica    Tobacco abuse    Past Surgical History:  Procedure Laterality Date   ANAL FISTULECTOMY  1970s X 3   BACK SURGERY     COLONOSCOPY WITH PROPOFOL N/A 04/24/2021   Procedure: COLONOSCOPY WITH PROPOFOL;  Surgeon: [name], MD;  Location: [facility] ENDOSCOPY;  Service: Gastroenterology;  Laterality: N/A;   HERNIA REPAIR     KNEE ARTHROSCOPY Right    LAPAROSCOPIC CHOLECYSTECTOMY     LAPAROSCOPIC INCISIONAL / UMBILICAL / VENTRAL HERNIA REPAIR  08/13/2016   [place] w/mesh   LOWER EXTREMITY ANGIOGRAPHY Left 09/29/2020   Procedure: LOWER EXTREMITY ANGIOGRAPHY;  Surgeon: Algernon Huxley, MD;  Location[facility];  Service: Cardiovascular;  Laterality: Left;   LOWER EXTREMITY ANGIOGRAPHY Right 10/06/2020   Procedure: LOWER EXTREMITY ANGIOGRAPHY;  Surgeon: Algernon Huxley, MD;  Location: [facility];  Service: Cardiovascular;  Laterality: Right;   LUMBAR DISC SURGERY     "herniated disc"   NASAL FRACTURE SURGERY  1970s X 2   RIGHT/LEFT HEART CATH AND CORONARY ANGIOGRAPHY N/A 12/27/2016   Procedure: RIGHT/LEFT HEART CATH AND CORONARY ANGIOGRAPHY;  Surgeon:  Leonie Man, MD;  Location: [facility] INVASIVE CV LAB: Mild-moderate pulmonary hypertension.  Hyperdynamic ventricle noted.  EF 55-65% -hyperdynamic (unable to measure LVOT gradient).  Mildly elevated very tortuous but angiographically normal coronary arteries, suggesting hypertensive heart disease   SHOULDER ARTHROSCOPY WITH ROTATOR CUFF REPAIR Right 1990s?   TONSILLECTOMY  1951   TRANSTHORACIC ECHOCARDIOGRAM  12/2016    EF 60-65% with dynamic outflow tract obstruction at rest. Peak gradient 52 mmHg consistent with HOCM.  GRII DD area. Aortic sclerosis but no stenosis. Systolic anterior motion of mitral valve chordae. Mild mitral stenosis. Moderate LA dilation and mild RA dilation. Peak PA pressures 35 mmHg.   VENTRAL HERNIA REPAIR  N/A 08/13/2016   Procedure: LAPAROSCOPIC VENTRAL HERNIA REPAIR WITH MESH;  Surgeon: Judeth Horn, MD;  Location: [place];  Service: General;  Laterality: N/A;    Allergies  No Known Allergies  History of Present Illness    70 year old female with the above past medical history including paroxysmal atrial fibrillation, nonobstructive CAD, hypertension, hyperlipidemia, tobacco abuse, alcohol use, diastolic dysfunction, anxiety, depression, COPD, peripheral arterial disease, and cryptogenic stroke.  She previously underwent diagnostic catheterization in August 2018, showing minor irregularities in the LAD, obtuse marginal branches, and RPDA, with normal LV function.  She was medically managed.  Echocardiogram in August 2021, showed normal LV function with mild diastolic dysfunction, and mild mitral regurgitation.  In February 2022, she was admitted to [facility] with right-sided facial numbness and blurred vision.  CT showed infarct involving the caudate nucleus.  TEE was not performed.  She subsequently underwent implantable loop recorder in March.  In May 2022, in the setting of claudication, she underwent peripheral arteriography revealing severe bilateral lower extremity disease with staged PTA and stenting of the left lower extremity on May 12, followed by PTA and stenting of the right lower extremity on May 19.  Follow-up ABI in June 2022 was normal.  In July, she was found to have paroxysmal atrial fibrillation on her loop recorder and was placed on oral anticoagulation.  Ms. Strong was last seen in cardiology clinic in August, at which time she was doing well.  She has since noted intermittent bilateral arm pain though more frequently in the left arm, that typically occurs at rest a few times a month, without associated symptoms, lasting 5 to 10 minutes, and resolving by using a heating pad.  When left arm pain occurs, it is associated with tightness/spasms of the muscles in her hands and she notes that  she has trouble making a fist during these episodes.  Symptoms are more likely to occur when it is cool and damp outside and do not change with palpation or upper body/arm movements/position changes.  She has not had any chest pain.  She is relatively inactive in the setting of chronic leg pain, which is unchanged over time.  She continues to smoke a pack of cigarettes per day and has some degree of chronic dyspnea on exertion, which is unchanged.  She denies palpitations, PND, orthopnea, dizziness, syncope, edema, or early satiety.  She recently fell in her home without associated presyncope or syncope and struck her left knee and left arm.  She still has some bruising on her left arm.  Home Medications    Current Outpatient Medications  Medication Sig Dispense Refill   apixaban (ELIQUIS) 5 MG TABS tablet Take 1 tablet (5 mg total) by mouth 2 (two) times daily. 180 tablet 3   atorvastatin (LIPITOR) 40 MG tablet TAKE 1 TABLET  BY MOUTH  DAILY 90 tablet 1   buPROPion (WELLBUTRIN XL) 150 MG 24 hr tablet TAKE 1 TABLET BY MOUTH  DAILY 90 tablet 3   busPIRone (BUSPAR) 15 MG tablet TAKE 1 TABLET BY MOUTH  TWICE DAILY 180 tablet 0   carvedilol (COREG) 12.5 MG tablet Take by mouth 2 (two) times daily.     celecoxib (CELEBREX) 100 MG capsule TAKE 1 CAPSULE BY MOUTH  DAILY 90 capsule 3   cilostazol (PLETAL) 50 MG tablet TAKE 1 TABLET BY MOUTH  TWICE DAILY 180 tablet 3   escitalopram (LEXAPRO) 10 MG tablet TAKE 1 TABLET BY MOUTH  DAILY 90 tablet 1   fenofibrate micronized (LOFIBRA) 134 MG capsule Take 1 capsule (134 mg total) by mouth daily before breakfast. NEEDS APPOINTMENT FOR FUTURE REFILLS 15 capsule 0   folic acid (FOLVITE) 1 MG tablet Take 1 tablet (1 mg total) by mouth daily. 30 tablet 0   furosemide (LASIX) 40 MG tablet TAKE 1 TABLET BY MOUTH  DAILY 90 tablet 3   gabapentin (NEURONTIN) 100 MG capsule TAKE 2 CAPSULES BY MOUTH IN THE MORNING, 1 CAPSULE IN  THE AFTERNOON AND 2  CAPSULES AT NIGHT 450  capsule 3   hydrOXYzine (VISTARIL) 25 MG capsule TAKE 1 CAPSULE BY MOUTH  EVERY 8 HOURS AS NEEDED 180 capsule 1   ibandronate (BONIVA) 150 MG tablet Take one  tab po once a month  in the morning with a full glass of water, on an empty stomach, and do not take anything else by mouth or lie down for the next 30 min. 3 tablet 3   meclizine (ANTIVERT) 25 MG tablet Take 1 tablet (25 mg total) by mouth 3 (three) times daily as needed for dizziness. 30 tablet 0   Multiple Vitamin (MULTIVITAMIN WITH MINERALS) TABS tablet Take 1 tablet by mouth daily.     pantoprazole (PROTONIX) 40 MG tablet TAKE 1 TABLET BY MOUTH  DAILY 90 tablet 1   Potassium 99 MG TABS Take by mouth.     tiZANidine (ZANAFLEX) 2 MG tablet TAKE 1 TABLET BY MOUTH  EVERY 8 HOURS AS NEEDED FOR MUSCLE SPASM(S) 270 tablet 1   traZODone (DESYREL) 100 MG tablet TAKE 1 TABLET BY MOUTH  DAILY AT BEDTIME 90 tablet 3   valsartan (DIOVAN) 160 MG tablet TAKE 1 TABLET BY MOUTH  DAILY 90 tablet 3   No current facility-administered medications for this visit.     Review of Systems    Bilateral arm pain-more frequently in the left.  Chronic dyspnea on exertion.  Chronic bilateral lower extremity pain -unchanged.  She denies palpitations, PND, orthopnea, dizziness, syncope, edema, or early satiety.  All other systems reviewed and are otherwise negative except as noted above.    Physical Exam    VS:  BP 120/70 (BP Location: Left Arm, Patient Position: Sitting, Cuff Size: Normal)    Pulse 60    Ht 5\' 3"  (1.6 m)    Wt 165 lb (74.8 kg)    SpO2 98%    BMI 29.23 kg/m  , BMI Body mass index is 29.23 kg/m.     GEN: Well nourished, well developed, in no acute distress. HEENT: normal. Neck: Supple, no JVD, carotid bruits, or masses. Cardiac: RRR, 2/6 systolic murmur heard throughout, no rubs or gallops. No clubbing, cyanosis, edema.  Radials 2+/PT 1+ and equal bilaterally.  Respiratory:  Respirations regular and unlabored, scattered rhonchi  bilaterally. GI: Soft, nontender, nondistended, BS + x 4. MS: no  deformity or atrophy. Skin: warm and dry, no rash. Neuro:  Strength and sensation are intact. Psych: Normal affect.  Accessory Clinical Findings    ECG personally reviewed by me today -regular sinus rhythm, 60- no acute changes.  Lab Results  Component Value Date   WBC 13.0 (H) 12/30/2020   HGB 16.3 (H) 12/30/2020   HCT 47.2 (H) 12/30/2020   MCV 90 12/30/2020   PLT 229 12/30/2020   Lab Results  Component Value Date   CREATININE 0.89 12/30/2020   BUN 15 12/30/2020   NA 141 12/30/2020   K 3.8 12/30/2020   CL 103 12/30/2020   CO2 23 12/30/2020   Lab Results  Component Value Date   ALT 9 12/30/2020   AST 13 12/30/2020   ALKPHOS 75 12/30/2020   BILITOT 0.8 12/30/2020   Lab Results  Component Value Date   CHOL 145 12/30/2020   HDL 40 12/30/2020   LDLCALC 57 12/30/2020   LDLDIRECT 66.0 10/20/2015   TRIG 310 (H) 12/30/2020   CHOLHDL 4.7 07/05/2020    Lab Results  Component Value Date   HGBA1C 5.1 07/05/2020    Assessment & Plan    1.  Bilateral arm pain/nonobstructive CAD: Patient advised to follow-up with cardiology due to bilateral arm pain that has been occurring over several months.  It's more frequently occurring in the left arm, several times per month, typically when it is cool and damp outside, exclusively at rest, associated with left hand muscle spasms, lasting around 10 minutes, and improving following application of a heating pad.  She has never had exertional symptoms and denies any associated dyspnea.  Discomfort does not seem to worsen with palpation or use of the upper extremities/position changes.  She previously underwent diagnostic catheterization in August 2018 showing minor irregularities in the LAD, obtuse marginal, and RPDA.  Current symptoms are fairly atypical for a cardiac etiology.  We discussed potential options for evaluation and at this time, mutually agreed to hold off on stress  testing.  She remains on statin therapy.  No aspirin in the setting of chronic Eliquis.  2.  Paroxysmal atrial fibrillation: Noted on implantable loop recorder.  Asymptomatic.  In sinus rhythm today.  Continue beta-blocker and metoprolol.  3.  Essential hypertension: Stable on beta-blocker and ARB.  4.  Hyperlipidemia: Hyperlipidemia: LDL of 57 in August.  She remains on statin therapy.  5.  Peripheral arterial disease: Status post bilateral lower extremity angioplasty and stenting in May 2022.  Normal ABIs in June.  She has chronic bilateral thigh discomfort however, she has not had any recurrent claudication.  She is followed by vascular surgery.  6.  Tobacco abuse: Still smoking a pack a day.  She does not foresee quitting and notes that she smokes because she is bored.  Complete cessation advised.  7.  History of stroke: Continues to have some unsteadiness to her gait and right visual disturbance but overall has been stable.  She is on statin and Eliquis therapy as outlined above.  8.  Disposition: Patient will contact us for any worsening of arm pain or if she changes her mind regarding noninvasive ischemic evaluation.  Otherwise, follow-up in 6 months or sooner if necessary.   Murray Hodgkins, NP 05/05/2021, 12:08 PM

## 2021-05-11 ENCOUNTER — Encounter: Payer: Self-pay | Admitting: Internal Medicine

## 2021-05-11 ENCOUNTER — Telehealth: Payer: Self-pay | Admitting: Gastroenterology

## 2021-05-11 LAB — CUP PACEART REMOTE DEVICE CHECK
Date Time Interrogation Session: 20221222082544
Implantable Pulse Generator Implant Date: 20220330

## 2021-05-11 MED ORDER — NA SULFATE-K SULFATE-MG SULF 17.5-3.13-1.6 GM/177ML PO SOLN: 354.0000 mL | Freq: Once | ORAL | 0 refills | Status: AC

## 2021-05-11 NOTE — Telephone Encounter (Signed)
Prep was sent to [facility] at [place] per patient's request.

## 2021-05-11 NOTE — Telephone Encounter (Signed)
Inbound call from pt requesting for her prep to be sent to [facility] on [address]. Thank you.

## 2021-05-17 ENCOUNTER — Ambulatory Visit (INDEPENDENT_AMBULATORY_CARE_PROVIDER_SITE_OTHER): Payer: Medicare Other

## 2021-05-17 ENCOUNTER — Encounter: Payer: Self-pay | Admitting: Gastroenterology

## 2021-05-17 DIAGNOSIS — I639 Cerebral infarction, unspecified: Secondary | ICD-10-CM

## 2021-05-18 ENCOUNTER — Encounter
Admission: RE | Disposition: A | Discharge status: Home / Self Care | Payer: Self-pay | Source: Home / Self Care | Attending: Gastroenterology

## 2021-05-18 ENCOUNTER — Ambulatory Visit
Admission: RE | Admit: 2021-05-18 | Discharge: 2021-05-18 | Disposition: A | Discharge status: Home / Self Care | Payer: Medicare Other | Attending: Gastroenterology | Admitting: Gastroenterology

## 2021-05-18 ENCOUNTER — Ambulatory Visit: Payer: Medicare Other | Admitting: Registered Nurse

## 2021-05-18 ENCOUNTER — Encounter: Payer: Self-pay | Admitting: Gastroenterology

## 2021-05-18 DIAGNOSIS — Z1211 Encounter for screening for malignant neoplasm of colon: Secondary | ICD-10-CM | POA: Diagnosis not present

## 2021-05-18 DIAGNOSIS — R197 Diarrhea, unspecified: Secondary | ICD-10-CM | POA: Diagnosis not present

## 2021-05-18 DIAGNOSIS — Z538 Procedure and treatment not carried out for other reasons: Secondary | ICD-10-CM | POA: Diagnosis not present

## 2021-05-18 DIAGNOSIS — K529 Noninfective gastroenteritis and colitis, unspecified: Secondary | ICD-10-CM

## 2021-05-18 SURGERY — COLONOSCOPY WITH PROPOFOL
Anesthesia: General

## 2021-05-18 MED ORDER — PROPOFOL 500 MG/50ML IV EMUL
INTRAVENOUS | Status: AC
  Filled 2021-05-18: qty 100

## 2021-05-18 MED ORDER — SODIUM CHLORIDE 0.9 % IV SOLN: INTRAVENOUS | Status: DC

## 2021-05-18 MED ORDER — LIDOCAINE HCL (PF) 2 % IJ SOLN
INTRAMUSCULAR | Status: AC
  Filled 2021-05-18: qty 10

## 2021-05-18 MED ORDER — PHENYLEPHRINE HCL-NACL 20-0.9 MG/250ML-% IV SOLN
INTRAVENOUS | Status: AC
  Filled 2021-05-18: qty 250

## 2021-05-18 NOTE — Anesthesia Preprocedure Evaluation (Deleted)
Anesthesia Evaluation    History of Anesthesia Complications (+) PONV and history of anesthetic complications  Airway        Dental   Pulmonary COPD, Current Smoker and Patient abstained from smoking.,           Cardiovascular hypertension, + Peripheral Vascular Disease (s/p LE stents) and +CHF (diastolic dysfunction)  + dysrhythmias (a fib)   ECG 05/05/21: normal   Neuro/Psych  Headaches, PSYCHIATRIC DISORDERS (agoraphobia with panic disorder) Anxiety Depression Alcohol use disorder CVA (on Eliquis)    GI/Hepatic GERD  ,  Endo/Other    Renal/GU      Musculoskeletal  (+) Arthritis ,   Abdominal   Peds  Hematology  (+) Blood dyscrasia, anemia ,   Anesthesia Other Findings Reviewed 05/05/21 cardiology note  Reproductive/Obstetrics                             Anesthesia Physical Anesthesia Plan  ASA: 4  Anesthesia Plan: General   Post-op Pain Management:    Induction: Intravenous  PONV Risk Score and Plan: 3 and Propofol infusion, TIVA and Treatment may vary due to age or medical condition  Airway Management Planned: Natural Airway  Additional Equipment:   Intra-op Plan:   Post-operative Plan:   Informed Consent: I have reviewed the patients History and Physical, chart, labs and discussed the procedure including the risks, benefits and alternatives for the proposed anesthesia with the patient or authorized representative who has indicated his/her understanding and acceptance.       Plan Discussed with: CRNA  Anesthesia Plan Comments: (LMA/GETA backup discussed.  Patient consented for risks of anesthesia including but not limited to:  - adverse reactions to medications - damage to eyes, teeth, lips or other oral mucosa - nerve damage due to positioning  - sore throat or hoarseness - damage to heart, brain, nerves, lungs, other parts of body or loss of life  Informed patient  about role of CRNA in peri- and intra-operative care.  Patient voiced understanding.)        Anesthesia Quick Evaluation

## 2021-05-18 NOTE — Progress Notes (Signed)
Patient here for colonoscopy. Says she took last dose of Eliquis on Tuesday, stating "The office told me to take it Tuesday. This is the second time I have been here for the colonoscopy". Dr. Vicente Males notified and offered patient to stay clear liquids today and reschedule colonoscopy for tomorrow Friday. Spoke with niece who is her driver and niece cannot drive her home tomorrow. Niece and patient aware that office will call her to reschedule.  Niece on phone voiced concern over patient having an episode last night where she was slow to respond and also concerns over her not being able to take care of herself at home. Advised niece to contact patient's Primary care MD about the home situation and patient was alert and oriented and VSS this morning but advised to take her to ED if she feels further evaluation for the "episode needs to be checked out". Niece understands and agrees. They will take get her food and drink this morning

## 2021-05-26 NOTE — Progress Notes (Signed)
Carelink Summary Report / Loop Recorder 

## 2021-05-29 ENCOUNTER — Other Ambulatory Visit: Payer: Self-pay

## 2021-05-29 ENCOUNTER — Ambulatory Visit (INDEPENDENT_AMBULATORY_CARE_PROVIDER_SITE_OTHER): Payer: Medicare Other | Admitting: Gastroenterology

## 2021-05-29 ENCOUNTER — Encounter: Payer: Self-pay | Admitting: Gastroenterology

## 2021-05-29 VITALS — BP 146/99 | HR 62 | Temp 98.4°F | Ht 63.0 in | Wt 174.2 lb

## 2021-05-29 DIAGNOSIS — Z8619 Personal history of other infectious and parasitic diseases: Secondary | ICD-10-CM

## 2021-05-29 DIAGNOSIS — K529 Noninfective gastroenteritis and colitis, unspecified: Secondary | ICD-10-CM

## 2021-05-29 DIAGNOSIS — Z1211 Encounter for screening for malignant neoplasm of colon: Secondary | ICD-10-CM

## 2021-05-29 DIAGNOSIS — R69 Illness, unspecified: Secondary | ICD-10-CM | POA: Diagnosis not present

## 2021-05-29 NOTE — Progress Notes (Signed)
Sarah Bellows MD, MRCP(U.K) [address]  [place]  [place], [place] [address]  Main: [phone]  Fax: [phone]   Primary Care Physician: Sarah Guise, MD  Primary Gastroenterologist:  Dr. Jonathon Barnes   Chief Complaint  Patient presents with   Diarrhea   Follow-up    HPI: Sarah Barnes is a 71 y.o. female    Summary of history :  He was initially referred and seen on 03/30/2021 for chronic diarrhea and colon cancer screening.  Has been ongoing for 6 months.  Multiple bowel movements per day very watery in nature.  Denies any NSAID use.  Denies any use of artificial sugars or sweeteners or sodas.  No unintentional weight loss. 12/30/2020 iron studies normal, TSH normal, B12 folate normal, CMP normal except elevated glucose.  Hemoglobin 16.3 g.  Interval history   03/30/2021-05/29/2021   04/02/2021 stool for C. difficile negative.  GI PCR detected norovirus.  Fecal calprotectin was negative.  Celiac serology was negative.  Colonoscopy was rescheduled.  Eventually was not done on 2 occasions due to some form of miscommunication and as a result of which her Eliquis was not held for at least 2 days. Since her last visit the diarrhea is completely stopped.  She is willing to proceed with her colonoscopy for colon cancer screening at this point of time   Current Outpatient Medications  Medication Sig Dispense Refill   apixaban (ELIQUIS) 5 MG TABS tablet Take 1 tablet (5 mg total) by mouth 2 (two) times daily. 180 tablet 3   atorvastatin (LIPITOR) 40 MG tablet TAKE 1 TABLET BY MOUTH  DAILY 90 tablet 1   buPROPion (WELLBUTRIN XL) 150 MG 24 hr tablet TAKE 1 TABLET BY MOUTH  DAILY 90 tablet 3   busPIRone (BUSPAR) 15 MG tablet TAKE 1 TABLET BY MOUTH  TWICE DAILY 180 tablet 0   carvedilol (COREG) 12.5 MG tablet Take by mouth 2 (two) times daily.     celecoxib (CELEBREX) 100 MG capsule TAKE 1 CAPSULE BY MOUTH  DAILY 90 capsule 3   cilostazol (PLETAL) 50 MG tablet TAKE 1  TABLET BY MOUTH  TWICE DAILY 180 tablet 3   escitalopram (LEXAPRO) 10 MG tablet TAKE 1 TABLET BY MOUTH  DAILY 90 tablet 1   fenofibrate micronized (LOFIBRA) 134 MG capsule Take 1 capsule (134 mg total) by mouth daily before breakfast. NEEDS APPOINTMENT FOR FUTURE REFILLS 15 capsule 0   folic acid (FOLVITE) 1 MG tablet Take 1 tablet (1 mg total) by mouth daily. 30 tablet 0   furosemide (LASIX) 40 MG tablet TAKE 1 TABLET BY MOUTH  DAILY 90 tablet 3   gabapentin (NEURONTIN) 100 MG capsule TAKE 2 CAPSULES BY MOUTH IN THE MORNING, 1 CAPSULE IN  THE AFTERNOON AND 2  CAPSULES AT NIGHT 450 capsule 3   hydrOXYzine (VISTARIL) 25 MG capsule TAKE 1 CAPSULE BY MOUTH  EVERY 8 HOURS AS NEEDED 180 capsule 1   ibandronate (BONIVA) 150 MG tablet Take one  tab po once a month  in the morning with a full glass of water, on an empty stomach, and do not take anything else by mouth or lie down for the next 30 min. 3 tablet 3   meclizine (ANTIVERT) 25 MG tablet Take 1 tablet (25 mg total) by mouth 3 (three) times daily as needed for dizziness. 30 tablet 0   Multiple Vitamin (MULTIVITAMIN WITH MINERALS) TABS tablet Take 1 tablet by mouth daily.     pantoprazole (  PROTONIX) 40 MG tablet TAKE 1 TABLET BY MOUTH  DAILY 90 tablet 1   Potassium 99 MG TABS Take by mouth.     tiZANidine (ZANAFLEX) 2 MG tablet TAKE 1 TABLET BY MOUTH  EVERY 8 HOURS AS NEEDED FOR MUSCLE SPASM(S) 270 tablet 1   traZODone (DESYREL) 100 MG tablet TAKE 1 TABLET BY MOUTH  DAILY AT BEDTIME 90 tablet 3   valsartan (DIOVAN) 160 MG tablet TAKE 1 TABLET BY MOUTH  DAILY 90 tablet 3   No current facility-administered medications for this visit.    Allergies as of 05/29/2021   (No Known Allergies)    ROS:  General: Negative for anorexia, weight loss, fever, chills, fatigue, weakness. ENT: Negative for hoarseness, difficulty swallowing , nasal congestion. CV: Negative for chest pain, angina, palpitations, dyspnea on exertion, peripheral edema.   Respiratory: Negative for dyspnea at rest, dyspnea on exertion, cough, sputum, wheezing.  GI: See history of present illness. GU:  Negative for dysuria, hematuria, urinary incontinence, urinary frequency, nocturnal urination.  Endo: Negative for unusual weight change.    Physical Examination:   BP (!) 146/99 (BP Location: Left Arm, Patient Position: Sitting, Cuff Size: Normal)    Pulse 62    Temp 98.4 F (36.9 C) (Oral)    Ht 5\' 3"  (1.6 m)    Wt 174 lb 3.2 oz (79 kg)    BMI 30.86 kg/m   General: Well-nourished, well-developed in no acute distress.  Eyes: No icterus. Conjunctivae pink. Neuro: Alert and oriented x 3.  Grossly intact. Skin: Warm and dry, no jaundice.   Psych: Alert and cooperative, normal mood and affect.   Imaging Studies: CUP PACEART REMOTE DEVICE CHECK  Result Date: 05/11/2021 ILR summary report received. Battery status OK. Normal device function. No new symptom, tachy, or brady episodes. No new AF episodes. There was one false pause event detected that was undersensing the QRS complex.   Monthly summary reports and ROV/PRN Kathy Breach, RN, CCDS, [employer]   Assessment and Plan:   Sarah Barnes is a 71 y.o. y/o female initially referred for chronic diarrhea.  Stool studies to monitor norovirus.  Colonoscopy was postponed but was scheduled on 05/18/2021 but was canceled as she had not had her Eliquis for the adequate period of 2 days.  She is ready to schedule her procedure at this point of time her chronic diarrhea has resolved.  Plan 1.  Colonoscopy for colon cancer screening and hold Eliquis for at least 2 days prior to procedure  2.  Chronic diarrhea has resolved likely secondary to norovirus that is usually self resolving   I have discussed alternative options, risks & benefits,  which include, but are not limited to, bleeding, infection, perforation,respiratory complication & drug reaction.  The patient agrees with this plan & written consent  will be obtained.       Dr Sarah Bellows  MD,MRCP [facility]) Follow up in as needed

## 2021-06-01 ENCOUNTER — Other Ambulatory Visit: Payer: Self-pay

## 2021-06-01 ENCOUNTER — Ambulatory Visit (INDEPENDENT_AMBULATORY_CARE_PROVIDER_SITE_OTHER): Payer: Commercial Managed Care - HMO | Admitting: Physician Assistant

## 2021-06-01 ENCOUNTER — Encounter: Payer: Self-pay | Admitting: Physician Assistant

## 2021-06-01 DIAGNOSIS — I4891 Unspecified atrial fibrillation: Secondary | ICD-10-CM

## 2021-06-01 DIAGNOSIS — W19XXXA Unspecified fall, initial encounter: Secondary | ICD-10-CM

## 2021-06-01 DIAGNOSIS — R531 Weakness: Secondary | ICD-10-CM | POA: Diagnosis not present

## 2021-06-01 DIAGNOSIS — M25552 Pain in left hip: Secondary | ICD-10-CM | POA: Diagnosis not present

## 2021-06-01 DIAGNOSIS — R69 Illness, unspecified: Secondary | ICD-10-CM | POA: Diagnosis not present

## 2021-06-01 DIAGNOSIS — I10 Essential (primary) hypertension: Secondary | ICD-10-CM | POA: Diagnosis not present

## 2021-06-01 DIAGNOSIS — M79604 Pain in right leg: Secondary | ICD-10-CM

## 2021-06-01 MED ORDER — TRAMADOL HCL 50 MG PO TABS: 50.0000 mg | ORAL_TABLET | Freq: Two times a day (BID) | ORAL | 0 refills | Status: DC | PRN

## 2021-06-01 NOTE — Progress Notes (Signed)
[facility] [place], [place] [address]  Internal MEDICINE  Office Visit Note  Patient Name: Sarah Barnes  [id number]  [id number]  Date of Service: 06/06/2021  Chief Complaint  Patient presents with   Follow-up   Depression   Hypertension   Hyperlipidemia   Gastroesophageal Reflux   COPD   Hip Pain    Left hip and leg pain; woke pt out of sleep this morning - wants to know what she can take to help with pain    HPI Pt is here for routine follow up with several acute concerns -Had an instance of trying to get up to restroom the week before Christmas and had to have help getting up. Did fall and hit left side--arm and leg, did not hit head or lose consciousness. States that pain in arm and weakness started prior to fall though. She had a follow up visit with cardiology due to left arm pain preceding fall, but nothing concerning on exam. Admits that she was supposed to have a colonoscopy prior and on 2 instances this had to be cancelled due to a miscommunication with holding Eliquis. She does state that after doing the prep for colonoscopy one of the times she did feel very weak and may have been dehydrated following prep. Additionally she was having diarrhea prior to this and was found to have norovirus via GI office which may have contributed to feel more weak and fatigued. -She states she continues to feel somewhat weaker on the left side and still has some pain in left hip down into her leg. Discussed obtaining an xray to ensure no acute changes and that PT may be indicated to help with regaining strength. -patient does have a hx of CVA which affected her eye but denies any unilateral weakness from this. -denies any Chest pain, problems with speech, or new vision changes -Discussed if any acute worsening of symptoms or new symptoms to go to ED -Additionally states she could not tolerate cpap machine and turned it back in  Current Medication: Outpatient  Encounter Medications as of 06/01/2021  Medication Sig   apixaban (ELIQUIS) 5 MG TABS tablet Take 1 tablet (5 mg total) by mouth 2 (two) times daily.   atorvastatin (LIPITOR) 40 MG tablet TAKE 1 TABLET BY MOUTH  DAILY   buPROPion (WELLBUTRIN XL) 150 MG 24 hr tablet TAKE 1 TABLET BY MOUTH  DAILY   busPIRone (BUSPAR) 15 MG tablet TAKE 1 TABLET BY MOUTH  TWICE DAILY   carvedilol (COREG) 12.5 MG tablet Take by mouth 2 (two) times daily.   celecoxib (CELEBREX) 100 MG capsule TAKE 1 CAPSULE BY MOUTH  DAILY   cilostazol (PLETAL) 50 MG tablet TAKE 1 TABLET BY MOUTH  TWICE DAILY   escitalopram (LEXAPRO) 10 MG tablet TAKE 1 TABLET BY MOUTH  DAILY   fenofibrate micronized (LOFIBRA) 134 MG capsule Take 1 capsule (134 mg total) by mouth daily before breakfast. NEEDS APPOINTMENT FOR FUTURE REFILLS   folic acid (FOLVITE) 1 MG tablet Take 1 tablet (1 mg total) by mouth daily.   furosemide (LASIX) 40 MG tablet TAKE 1 TABLET BY MOUTH  DAILY   gabapentin (NEURONTIN) 100 MG capsule TAKE 2 CAPSULES BY MOUTH IN THE MORNING, 1 CAPSULE IN  THE AFTERNOON AND 2  CAPSULES AT NIGHT   hydrOXYzine (VISTARIL) 25 MG capsule TAKE 1 CAPSULE BY MOUTH  EVERY 8 HOURS AS NEEDED   ibandronate (BONIVA) 150 MG tablet Take one  tab po once  a month  in the morning with a full glass of water, on an empty stomach, and do not take anything else by mouth or lie down for the next 30 min.   meclizine (ANTIVERT) 25 MG tablet Take 1 tablet (25 mg total) by mouth 3 (three) times daily as needed for dizziness.   Multiple Vitamin (MULTIVITAMIN WITH MINERALS) TABS tablet Take 1 tablet by mouth daily.   pantoprazole (PROTONIX) 40 MG tablet TAKE 1 TABLET BY MOUTH  DAILY   Potassium 99 MG TABS Take by mouth.   tiZANidine (ZANAFLEX) 2 MG tablet TAKE 1 TABLET BY MOUTH  EVERY 8 HOURS AS NEEDED FOR MUSCLE SPASM(S)   traMADol (ULTRAM) 50 MG tablet Take 1 tablet (50 mg total) by mouth every 12 (twelve) hours as needed for up to 5 days.   traZODone (DESYREL)  100 MG tablet TAKE 1 TABLET BY MOUTH  DAILY AT BEDTIME   valsartan (DIOVAN) 160 MG tablet TAKE 1 TABLET BY MOUTH  DAILY   No facility-administered encounter medications on file as of 06/01/2021.    Surgical History: Past Surgical History:  Procedure Laterality Date   ANAL FISTULECTOMY  1970s X 3   BACK SURGERY     COLONOSCOPY WITH PROPOFOL N/A 04/24/2021   Procedure: COLONOSCOPY WITH PROPOFOL;  Surgeon: [name], MD;  Location[facility] ENDOSCOPY;  Service: Gastroenterology;  Laterality: N/A;   HERNIA REPAIR     KNEE ARTHROSCOPY Right    LAPAROSCOPIC CHOLECYSTECTOMY     LAPAROSCOPIC INCISIONAL / UMBILICAL / VENTRAL HERNIA REPAIR  08/13/2016   [place] w/mesh   LOWER EXTREMITY ANGIOGRAPHY Left 09/29/2020   Procedure: LOWER EXTREMITY ANGIOGRAPHY;  Surgeon: Algernon Huxley, MD;  Location: [place] CV LAB;  Service: Cardiovascular;  Laterality: Left;   LOWER EXTREMITY ANGIOGRAPHY Right 10/06/2020   Procedure: LOWER EXTREMITY ANGIOGRAPHY;  Surgeon: Algernon Huxley, MD;  Location: [place] CV LAB;  Service: Cardiovascular;  Laterality: Right;   LUMBAR DISC SURGERY     "herniated disc"   NASAL FRACTURE SURGERY  1970s X 2   RIGHT/LEFT HEART CATH AND CORONARY ANGIOGRAPHY N/A 12/27/2016   Procedure: RIGHT/LEFT HEART CATH AND CORONARY ANGIOGRAPHY;  Surgeon: Leonie Man, MD;  Location: [place] INVASIVE CV LAB: Mild-moderate pulmonary hypertension.  Hyperdynamic ventricle noted.  EF 55-65% -hyperdynamic (unable to measure LVOT gradient).  Mildly elevated very tortuous but angiographically normal coronary arteries, suggesting hypertensive heart disease   SHOULDER ARTHROSCOPY WITH ROTATOR CUFF REPAIR Right 1990s?   TONSILLECTOMY  1951   TRANSTHORACIC ECHOCARDIOGRAM  12/2016    EF 60-65% with dynamic outflow tract obstruction at rest. Peak gradient 52 mmHg consistent with HOCM.  GRII DD area. Aortic sclerosis but no stenosis. Systolic anterior motion of mitral valve chordae. Mild mitral stenosis. Moderate LA  dilation and mild RA dilation. Peak PA pressures 35 mmHg.   VENTRAL HERNIA REPAIR N/A 08/13/2016   Procedure: LAPAROSCOPIC VENTRAL HERNIA REPAIR WITH MESH;  Surgeon: Judeth Horn, MD;  Location: [facility];  Service: General;  Laterality: N/A;    Medical History: Past Medical History:  Diagnosis Date   Agoraphobia with panic disorder    Anemia    Arthritis    "all over" (08/13/2016)   Chronic bronchitis ([place])    COPD (chronic obstructive pulmonary disease) ([place])    Cryptogenic stroke ([place])    a. 06/2020 Head CT: low density caudate nucleus concerning for infarct; b. 07/2020 s/p MDT Linq; c. 11/2020 Finding of Afib on Linq.   Depression    Dyspnea  occ   Dysrhythmia    palpitations occ due to anxiety   ETOH abuse    GAD (generalized anxiety disorder)    GERD (gastroesophageal reflux disease)    Headache    "daily til I got glasses; now have headache once in awhile" (08/13/2016)   Hepatitis 1960   "isolated &  hospitalized for 2 weeks; don't know which kind of hepatitis"    Hernia, hiatal    High cholesterol    Hypertension    Hypertrophic obstructive cardiomyopathy ([place])    Mild aortic stenosis    a. 01/2019 Echo: EF 55-60%. Mild AS; b. 12/2019 Echo: Nl EF. Mild diast dysfxn. Trace AS/AI/TR. Mild MR.   Nonobstructive CAD (coronary artery disease)    a. 12/2016 Cath: LM nl, LAD min irregs, LCX nl, OM1/2 min irregs, RCA nl, RPDA min irregs. EF 55-65%.   PAD (peripheral artery disease) ([place])    a. 09/29/2020 s/p PTA/DBA to L prox/mid SFA and stenting of L SFA; b. 10/06/2020 PTA of R TP trunk and prox peroenal. PTA/DBA of R SFA and prox R Popliteal. R SFA stenting x 2; c. 10/2020 ABI: R 0.92, L 0.93.   PAF (paroxysmal atrial fibrillation) ([place])    a.11/2020 PAF noted on Linq; b. CHA2DS2VASc = 6-->eliquis.   Pneumonia    "once or twice" (08/13/2016)   PONV (postoperative nausea and vomiting)    Sciatica    Tobacco abuse     Family History: Family History  Problem Relation Age of Onset    Diabetes Mother    Hyperlipidemia Mother    Hypertension Mother    Heart attack Mother 73   Angina Mother        chronic problem   Hypertension Father    Stroke Father    Cancer Brother     Social History   Socioeconomic History   Marital status: Single    Spouse name: Not on file   Number of children: Not on file   Years of education: Not on file   Highest education level: Not on file  Occupational History   Not on file  Tobacco Use   Smoking status: Every Day    Packs/day: 1.00    Years: 49.00    Pack years: 49.00    Types: Cigarettes   Smokeless tobacco: Never  Vaping Use   Vaping Use: Former  Substance and Sexual Activity   Alcohol use: Not Currently    Alcohol/week: 84.0 standard drinks    Types: 36 Cans of beer, 48 Shots of liquor per week    Comment: occ   Drug use: Not Currently    Frequency: 1.0 times per week    Types: Marijuana, Cocaine    Comment: Cocaine + March 2017   Sexual activity: Not Currently  Other Topics Concern   Not on file  Social History Narrative   Sarah Barnes has generalized anxiety disorder and clearcut Agoraphobia.   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    She currently lives with her sister.   Currently denies substance abuse beyond Tobacco (1-1/2 PPD) & EtOH (beer, vodka) - not ready to discuss quitting.   Social Determinants of Health   Financial Resource Strain: Not on file  Food Insecurity: Not on file  Transportation Needs: Not on file  Physical Activity: Not on file  Stress: Not on file  Social Connections: Not  on file  Intimate Partner Violence: Not on file      Review of Systems  Constitutional:  Positive for fatigue. Negative for chills and unexpected weight change.  HENT:  Negative for congestion, postnasal drip, rhinorrhea, sneezing and sore throat.   Eyes:  Negative for redness.  Respiratory:  Negative for cough,  chest tightness and shortness of breath.   Cardiovascular:  Negative for chest pain, palpitations and leg swelling.  Gastrointestinal:  Negative for abdominal pain, constipation, diarrhea, nausea and vomiting.  Genitourinary:  Negative for dysuria and frequency.  Musculoskeletal:  Positive for arthralgias and myalgias. Negative for back pain, joint swelling and neck pain.  Skin:  Negative for rash.  Neurological:  Positive for weakness. Negative for tremors and numbness.  Hematological:  Negative for adenopathy. Does not bruise/bleed easily.  Psychiatric/Behavioral:  Positive for sleep disturbance. Negative for behavioral problems (Depression) and suicidal ideas. The patient is nervous/anxious.    Vital Signs: BP (!) 150/90    Pulse 66    Temp 98.5 F (36.9 C)    Resp 16    Ht 5\' 3"  (1.6 m)    Wt 165 lb (74.8 kg)    SpO2 97%    BMI 29.23 kg/m    Physical Exam Vitals and nursing note reviewed.  Constitutional:      General: She is not in acute distress.    Appearance: She is well-developed. She is not diaphoretic.  HENT:     Head: Normocephalic and atraumatic.     Mouth/Throat:     Pharynx: No oropharyngeal exudate.  Eyes:     Pupils: Pupils are equal, round, and reactive to light.  Neck:     Thyroid: No thyromegaly.     Vascular: No JVD.     Trachea: No tracheal deviation.  Cardiovascular:     Rate and Rhythm: Normal rate and regular rhythm.     Heart sounds: Normal heart sounds. No murmur heard.   No friction rub. No gallop.  Pulmonary:     Effort: Pulmonary effort is normal. No respiratory distress.     Breath sounds: No wheezing or rales.  Chest:     Chest wall: No tenderness.  Abdominal:     General: Bowel sounds are normal.     Palpations: Abdomen is soft.  Musculoskeletal:        General: Tenderness present. Normal range of motion.     Cervical back: Normal range of motion and neck supple.     Comments: Tenderness in left hip radiates down leg with movement   Lymphadenopathy:     Cervical: No cervical adenopathy.  Skin:    General: Skin is warm and dry.  Neurological:     Mental Status: She is alert and oriented to person, place, and time.     Cranial Nerves: No cranial nerve deficit.  Psychiatric:        Behavior: Behavior normal.        Thought Content: Thought content normal.        Judgment: Judgment normal.       Assessment/Plan: 1. Left-sided weakness Will order xray of left hip and refer to PT to work on strengthening. If any worsening or new symptoms advised to go to ED. - DG Hip Unilat W OR W/O Pelvis 2-3 Views Left; Future - Ambulatory referral to Physical Therapy  2. Left hip pain Will order xray of left hip and refer to PT to work on strengthening. May take tramadol as needed but cautioned  on use as this can make her groggy - DG Hip Unilat W OR W/O Pelvis 2-3 Views Left; Future - traMADol (ULTRAM) 50 MG tablet; Take 1 tablet (50 mg total) by mouth every 12 (twelve) hours as needed for up to 5 days.  Dispense: 30 tablet; Refill: 0 - Ambulatory referral to Physical Therapy  3. Fall, initial encounter Unclear etiology, possibly due to weakness following GI infection. Will order xray even though it has been a month since fall to rule out any pathologic findings and refer to PT to work on strengthening - DG Hip Unilat W OR W/O Pelvis 2-3 Views Left; Future - Ambulatory referral to Physical Therapy  4. Primary hypertension Elevated in office, possible due to pain, continue to monitor  5. Atrial fibrillation, unspecified type ([place]) Followed by cardiology, on eliquis   General Counseling: Debralee verbalizes understanding of the findings of todays visit and agrees with plan of treatment. I have discussed any further diagnostic evaluation that may be needed or ordered today. We also reviewed her medications today. she has been encouraged to call the office with any questions or concerns that should arise related to todays  visit.    Orders Placed This Encounter  Procedures   DG Hip Unilat W OR W/O Pelvis 2-3 Views Left   Ambulatory referral to Physical Therapy    Meds ordered this encounter  Medications   traMADol (ULTRAM) 50 MG tablet    Sig: Take 1 tablet (50 mg total) by mouth every 12 (twelve) hours as needed for up to 5 days.    Dispense:  30 tablet    Refill:  0    This patient was seen by Drema Dallas, PA-C in collaboration with Dr. Clayborn Bigness as a part of collaborative care agreement.   Total time spent:35 Minutes Time spent includes review of chart, medications, test results, and follow up plan with the patient.      Dr [name] Internal medicine

## 2021-06-07 ENCOUNTER — Other Ambulatory Visit: Payer: Self-pay | Admitting: Physician Assistant

## 2021-06-07 DIAGNOSIS — M25552 Pain in left hip: Secondary | ICD-10-CM

## 2021-06-14 ENCOUNTER — Encounter: Payer: Self-pay | Admitting: Gastroenterology

## 2021-06-14 ENCOUNTER — Ambulatory Visit: Payer: Medicare Other | Attending: Physician Assistant

## 2021-06-14 DIAGNOSIS — W19XXXA Unspecified fall, initial encounter: Secondary | ICD-10-CM | POA: Insufficient documentation

## 2021-06-14 DIAGNOSIS — R531 Weakness: Secondary | ICD-10-CM | POA: Insufficient documentation

## 2021-06-14 DIAGNOSIS — M25652 Stiffness of left hip, not elsewhere classified: Secondary | ICD-10-CM | POA: Insufficient documentation

## 2021-06-14 DIAGNOSIS — R262 Difficulty in walking, not elsewhere classified: Secondary | ICD-10-CM | POA: Diagnosis not present

## 2021-06-14 DIAGNOSIS — M25552 Pain in left hip: Secondary | ICD-10-CM | POA: Insufficient documentation

## 2021-06-14 DIAGNOSIS — M6281 Muscle weakness (generalized): Secondary | ICD-10-CM | POA: Insufficient documentation

## 2021-06-14 NOTE — Therapy (Signed)
[facility] [address] [place], [address] Phone: [phone]   Fax:  [phone]  Physical Therapy Evaluation  Patient Details  Name: Sarah Barnes MRN: [id number] Date of Birth: [birth year] Referring Provider (PT): Drema Dallas PA-C   Encounter Date: 06/14/2021   PT End of Session - 06/14/21 1055     Visit Number 1    Number of Visits 16    Date for PT Re-Evaluation 08/09/21    Authorization Type [employer] Medicare primary, [place] Medicaid secondary    Authorization Time Period 06/14/21-08/09/21    Progress Note Due on Visit 10    PT Start Time 1000   arrived early   PT Stop Time 1045    PT Time Calculation (min) 45 min    Equipment Utilized During Treatment Gait belt    Activity Tolerance Patient tolerated treatment well;No increased pain;Patient limited by fatigue    Behavior During Therapy [facility] for tasks assessed/performed             Past Medical History:  Diagnosis Date   Agoraphobia with panic disorder    Anemia    Arthritis    "all over" (08/13/2016)   Chronic bronchitis (HCC)    COPD (chronic obstructive pulmonary disease) ([place])    Cryptogenic stroke ([place])    a. 06/2020 Head CT: low density caudate nucleus concerning for infarct; b. 07/2020 s/p MDT Linq; c. 11/2020 Finding of Afib on Linq.   Depression    Dyspnea    occ   Dysrhythmia    palpitations occ due to anxiety   ETOH abuse    GAD (generalized anxiety disorder)    GERD (gastroesophageal reflux disease)    Headache    "daily til I got glasses; now have headache once in awhile" (08/13/2016)   Hepatitis 1960   "isolated &  hospitalized for 2 weeks; don't know which kind of hepatitis"    Hernia, hiatal    High cholesterol    Hypertension    Hypertrophic obstructive cardiomyopathy (HCC)    Mild aortic stenosis    a. 01/2019 Echo: EF 55-60%. Mild AS; b. 12/2019 Echo: Nl EF. Mild diast dysfxn. Trace AS/AI/TR. Mild MR.   Nonobstructive  CAD (coronary artery disease)    a. 12/2016 Cath: LM nl, LAD min irregs, LCX nl, OM1/2 min irregs, RCA nl, RPDA min irregs. EF 55-65%.   PAD (peripheral artery disease) ([place])    a. 09/29/2020 s/p PTA/DBA to L prox/mid SFA and stenting of L SFA; b. 10/06/2020 PTA of R TP trunk and prox peroenal. PTA/DBA of R SFA and prox R Popliteal. R SFA stenting x 2; c. 10/2020 ABI: R 0.92, L 0.93.   PAF (paroxysmal atrial fibrillation) ([place])    a.11/2020 PAF noted on Linq; b. CHA2DS2VASc = 6-->eliquis.   Pneumonia    "once or twice" (08/13/2016)   PONV (postoperative nausea and vomiting)    Sciatica    Tobacco abuse     Past Surgical History:  Procedure Laterality Date   ANAL FISTULECTOMY  1970s X 3   BACK SURGERY     COLONOSCOPY WITH PROPOFOL N/A 04/24/2021   Procedure: COLONOSCOPY WITH PROPOFOL;  Surgeon: [name], MD;  Location: [facility] ENDOSCOPY;  Service: Gastroenterology;  Laterality: N/A;   FRACTURE SURGERY     HERNIA REPAIR     KNEE ARTHROSCOPY Right    LAPAROSCOPIC CHOLECYSTECTOMY     LAPAROSCOPIC INCISIONAL / UMBILICAL / VENTRAL HERNIA REPAIR  08/13/2016  VHR w/mesh   LOWER EXTREMITY ANGIOGRAPHY Left 09/29/2020   Procedure: LOWER EXTREMITY ANGIOGRAPHY;  Surgeon: Algernon Huxley, MD;  Location: [facility] CV LAB;  Service: Cardiovascular;  Laterality: Left;   LOWER EXTREMITY ANGIOGRAPHY Right 10/06/2020   Procedure: LOWER EXTREMITY ANGIOGRAPHY;  Surgeon: Algernon Huxley, MD;  Location: [facility] CV LAB;  Service: Cardiovascular;  Laterality: Right;   LUMBAR DISC SURGERY     "herniated disc"   NASAL FRACTURE SURGERY  1970s X 2   RIGHT/LEFT HEART CATH AND CORONARY ANGIOGRAPHY N/A 12/27/2016   Procedure: RIGHT/LEFT HEART CATH AND CORONARY ANGIOGRAPHY;  Surgeon: Leonie Man, MD;  Location: [facility] INVASIVE CV LAB: Mild-moderate pulmonary hypertension.  Hyperdynamic ventricle noted.  EF 55-65% -hyperdynamic (unable to measure LVOT gradient).  Mildly elevated very tortuous but angiographically  normal coronary arteries, suggesting hypertensive heart disease   SHOULDER ARTHROSCOPY WITH ROTATOR CUFF REPAIR Right 1990s?   TONSILLECTOMY  1951   TRANSTHORACIC ECHOCARDIOGRAM  12/2016    EF 60-65% with dynamic outflow tract obstruction at rest. Peak gradient 52 mmHg consistent with HOCM.  GRII DD area. Aortic sclerosis but no stenosis. Systolic anterior motion of mitral valve chordae. Mild mitral stenosis. Moderate LA dilation and mild RA dilation. Peak PA pressures 35 mmHg.   VENTRAL HERNIA REPAIR N/A 08/13/2016   Procedure: LAPAROSCOPIC VENTRAL HERNIA REPAIR WITH MESH;  Surgeon: Judeth Horn, MD;  Location: [facility];  Service: General;  Laterality: N/A;    There were no vitals filed for this visit.    Subjective Assessment - 06/14/21 1011     Subjective Pt presents to OPPT to address insidious onset BLE weakness and pain, strength has partial returned but not normal, balance remains off, pain remains frequent.    HPI Sarah Barnes is a 25yoF who presents to OPPT for evaluation of acute, insidious onset BLE weakness and inability to walk that began week of Christmas 2022 and resulted in 1-2 days in ability to walk, attempts resulted in a fall to floor requiring assist from family to help off floor. Pt referred by PCP, but medical workup has minimal by time of PT evaluation here. Pt denies any paresthesia, but has since has partial improvement in strength, now able to AMB all needed to perform ADL, IADL with intermittent concern of LLE buckling when up for prolonged periods. Pt also reports concordant pain in bilat thighs each day which seems to abate after morning time. Pt denies any prior history of similar episodes. Per pt- hip imaging is pending. At baseline pt is fully independent in ADL, IADL, community ambulation as needed, no assistive device use, no falls history, pt is a retired Biomedical scientist.   How long can you sit comfortably? Chronic knee pain limited prolonged sitting to 15-20 minutes    How  long can you stand comfortably? 5-10 minutes prior to leg weakness and shaking    How long can you walk comfortably? Pt able to walk enough to perform IADL/ADL, but anxious about pushing it further.    Patient Stated Goals Improve new legs pain and return balance confidence    Currently in Pain? No/denies               [facility] PT Assessment - 06/14/21 0001       Assessment   Medical Diagnosis Insidious onset leg pain/weakness starting end of December 2022    Referring Provider (PT) Drema Dallas PA-C    Onset Date/Surgical Date --   Late December 2022   Hand Dominance  Right    Prior Therapy None      Precautions   Precautions Fall      Balance Screen   Has the patient fallen in the past 6 months Yes    How many times? 1    Has the patient had a decrease in activity level because of a fear of falling?  Yes    Is the patient reluctant to leave their home because of a fear of falling?  Yes      [place] Private residence    Living Arrangements Other relatives   BIL   Available Help at Discharge Available 24 hours/day   BIL   Type of [place] to enter    Entrance Stairs-Number of Steps 3 or 4    Entrance Stairs-Rails Left;Right;Can reach both    Home Layout One level    [place] - single point      Prior Function   Level of Independence Independent with basic ADLs;Independent with community mobility without device    Vocation Retired    Leisure watching TV- [employer]      Cognition   Overall Cognitive Status Within Functional Limits for tasks assessed      Observation/Other Assessments   Focus on Therapeutic Outcomes (FOTO)  38      ROM / Strength   AROM / PROM / Strength PROM      PROM   PROM Assessment Site Hip    Right/Left Hip Right;Left    Right Hip Flexion 118   acetabular restriction in axilary line, less prominent than Left side   Right Hip External Rotation  50    Right Hip Internal  Rotation  16    Right Hip ABduction 26    Left Hip Flexion 108   firm, end-feel with obvious acetabular restriction in axilary line   Left Hip External Rotation  45    Left Hip Internal Rotation  12    Left Hip ABduction 27      Transfers   Five time sit to stand comments  hands ad lib on knees/thighs   17.14sec     Ambulation/Gait   Ambulation Distance (Feet) 430 Feet    Assistive device None    Gait velocity 0.64m/s      Balance   Balance Assessed Yes    Balance comment SLS< 2sec bilat (5 trials each side); EC, narrow, foam: 2 LOB at ~5sec each              Objective measurements completed on examination: See above findings.      PT Education - 06/14/21 1054     Education Details Limitations seen in both hip joints    Person(s) Educated Patient    Methods Explanation;Demonstration    Comprehension Verbalized understanding;Need further instruction             PT Short Term Goals - 06/15/21 0837       PT SHORT TERM GOAL #1   Title Pt to demonstrate correct HEP performance and report good compliancat at home.    Time 4    Period Weeks    Status New    Target Date 07/13/21      PT SHORT TERM GOAL #2   Title Pt to show improved bilat hip ER P/RM to >60 degrees bilat to facilitate improved motor control in gait and ADL performance.    Baseline 06/14/21: Rt 50,  Lt: 45    Time 4    Period Weeks    Status New    Target Date 07/13/21               PT Long Term Goals - 06/15/21 0837       PT LONG TERM GOAL #1   Title Pt to demonstrate FOTO score improvement by >15 to imply improve self-perception of facility in basic moblity for ADL/IADL.    Baseline 06/14/21: 53    Time 8    Period Weeks    Status New    Target Date 08/10/21      PT LONG TERM GOAL #2   Title Pt to demonstrate improved movement tolerance AEB 6MWT performance >1545ft without exacerbation of pain, and ROM testing tolerance without pain exacerbation.    Baseline 1/25: pain increase  with 3 min AMB, pain with ROM testing    Time 8    Period Weeks    Status New    Target Date 08/10/21      PT LONG TERM GOAL #3   Title Pt to demonstrate improved motor performance AEB 5xSTS<12sec hands free and MMT 5/5 in hips and knees.    Baseline 1/25: 5xSTS: 17sec, MMT pending    Time 8    Period Weeks    Status New    Target Date 08/10/21                Plan - 06/14/21 1056     Clinical Impression Statement Pt evlauated for acute insidious weakness and pain in BLE. Condition has since resolved, as pt was unable to walking inititally for 2-3 days. Pt denies any frank N/T paresthesias associated. Medical workup has been minimal at time of PT evaluation. Pt has flat pressures from sitting to standing, appropriate increase with activity, however sustained bradycardia is curious and likely limiting for longer actiivty- chronicity unclear at this time. Global screening of several systems demonstrates some increaed pain in Left posterior hip over 3 minutes AMB, capsular restriction of bilat hips Left > Right with symptomatic Left leg pain at end range of flexion, IR, ABDCT, and extension. Pt has rigid adn restricted mobility of pelvis and trunk in gait and demonstrates reduced control and higher variability of pelvis in frontal plane during gait, a sign that hip stabilizers are not functioning optimally. Pt has poor single limb stance performance. Pt will benefit from PT intervention to reduce pain, improve activity tolerance for return to PLOF in ADL, IADL.    Personal Factors and Comorbidities Time since onset of injury/illness/exacerbation    Examination-Activity Limitations Locomotion Level;Stand;Transfers    Examination-Participation Restrictions Yard Work    Stability/Clinical Decision Making Evolving/Moderate complexity    Clinical Decision Making Moderate    Rehab Potential Good    PT Frequency 2x / week    PT Duration 8 weeks    PT Treatment/Interventions Moist  Heat;Electrical Stimulation;Gait training;Stair training;Therapeutic activities;Functional mobility training;Therapeutic exercise;Neuromuscular re-education;Balance training;Patient/family education;Manual techniques;Passive range of motion;Dry needling    PT Next Visit Plan MMT BLE, begin basic leg strength routine for here and home    PT Home Exercise Plan Deferred to next visit    Consulted and Agree with Plan of Care Patient             Patient will benefit from skilled therapeutic intervention in order to improve the following deficits and impairments:     Visit Diagnosis: Stiffness of left hip, not elsewhere classified  Difficulty in walking,  not elsewhere classified  Muscle weakness (generalized)     Problem List Patient Active Problem List   Diagnosis Date Noted   OSA (obstructive sleep apnea) 01/03/2021   Other hypersomnia 01/03/2021   Atherosclerosis of native arteries of extremity with intermittent claudication ([place]) 09/13/2020   Stroke ([place]) 07/04/2020   COPD (chronic obstructive pulmonary disease) ([place])    ETOH abuse    HLD (hyperlipidemia)    CAD (coronary artery disease)    Tobacco abuse    Chronic diastolic CHF (congestive heart failure) ([place])    CKD (chronic kidney disease), stage IIIb    Encounter for general adult medical examination with abnormal findings 02/09/2020   Abnormal renal function finding 02/09/2020   Vitamin D deficiency 02/09/2020   Flu vaccine need 02/09/2020   Dysuria 02/09/2020   Bilateral leg pain 11/28/2019   Generalized abdominal pain 11/28/2019   Inflammatory polyarthropathy ([place]) 11/28/2019   Generalized weakness 01/26/2019   Alcohol use disorder    Hypertrophic obstructive cardiomyopathy ([place])    Aortic valve stenosis    Abnormal EKG 12/26/2016   Troponin level elevated 12/26/2016   Syncope 12/25/2016   Heart palpitations - PACs 12/24/2016   Shortness of breath at rest 12/24/2016   Ventral hernia 08/13/2016   Agitation  08/11/2015   Substance induced mood disorder ([place]) 08/11/2015   Panic disorder with agoraphobia 07/13/2015   Major depressive disorder, recurrent severe without psychotic features ([place]) 07/12/2015   Tobacco use disorder 07/12/2015   Substance abuse ([place]) 07/12/2015   Knee pain, bilateral 09/17/2014   Generalized anxiety disorder 05/17/2014   Hypertriglyceridemia 05/05/2014   GERD (gastroesophageal reflux disease) 05/05/2014   Essential hypertension 05/05/2014   8:49 AM, 06/15/21 Etta Grandchild, PT, DPT Physical Therapist - [place] [phone] (Office)  [place] C, PT 06/14/2021, 1:21 PM  [facility] [address] [place], [address] Phone: [phone]   Fax:  [phone]  Name: Sarah Barnes MRN: [id number] Date of Birth: 08-22-1950

## 2021-06-15 ENCOUNTER — Ambulatory Visit: Payer: Medicare Other | Admitting: Anesthesiology

## 2021-06-15 ENCOUNTER — Other Ambulatory Visit: Payer: Self-pay

## 2021-06-15 ENCOUNTER — Encounter
Admission: RE | Disposition: A | Discharge status: Home / Self Care | Payer: Self-pay | Source: Home / Self Care | Attending: Gastroenterology

## 2021-06-15 ENCOUNTER — Encounter: Payer: Self-pay | Admitting: Gastroenterology

## 2021-06-15 ENCOUNTER — Ambulatory Visit
Admission: RE | Admit: 2021-06-15 | Discharge: 2021-06-15 | Disposition: A | Discharge status: Home / Self Care | Payer: Medicare Other | Attending: Gastroenterology | Admitting: Gastroenterology

## 2021-06-15 DIAGNOSIS — I2510 Atherosclerotic heart disease of native coronary artery without angina pectoris: Secondary | ICD-10-CM | POA: Diagnosis not present

## 2021-06-15 DIAGNOSIS — I509 Heart failure, unspecified: Secondary | ICD-10-CM | POA: Diagnosis not present

## 2021-06-15 DIAGNOSIS — Z1211 Encounter for screening for malignant neoplasm of colon: Secondary | ICD-10-CM | POA: Diagnosis not present

## 2021-06-15 DIAGNOSIS — I110 Hypertensive heart disease with heart failure: Secondary | ICD-10-CM | POA: Diagnosis not present

## 2021-06-15 DIAGNOSIS — D127 Benign neoplasm of rectosigmoid junction: Secondary | ICD-10-CM | POA: Insufficient documentation

## 2021-06-15 DIAGNOSIS — K5730 Diverticulosis of large intestine without perforation or abscess without bleeding: Secondary | ICD-10-CM | POA: Insufficient documentation

## 2021-06-15 DIAGNOSIS — K5790 Diverticulosis of intestine, part unspecified, without perforation or abscess without bleeding: Secondary | ICD-10-CM | POA: Diagnosis not present

## 2021-06-15 DIAGNOSIS — I739 Peripheral vascular disease, unspecified: Secondary | ICD-10-CM | POA: Diagnosis not present

## 2021-06-15 DIAGNOSIS — K621 Rectal polyp: Secondary | ICD-10-CM | POA: Insufficient documentation

## 2021-06-15 DIAGNOSIS — K649 Unspecified hemorrhoids: Secondary | ICD-10-CM | POA: Diagnosis not present

## 2021-06-15 DIAGNOSIS — F4001 Agoraphobia with panic disorder: Secondary | ICD-10-CM | POA: Insufficient documentation

## 2021-06-15 DIAGNOSIS — F419 Anxiety disorder, unspecified: Secondary | ICD-10-CM | POA: Insufficient documentation

## 2021-06-15 DIAGNOSIS — K635 Polyp of colon: Secondary | ICD-10-CM

## 2021-06-15 DIAGNOSIS — D124 Benign neoplasm of descending colon: Secondary | ICD-10-CM | POA: Insufficient documentation

## 2021-06-15 DIAGNOSIS — I480 Paroxysmal atrial fibrillation: Secondary | ICD-10-CM | POA: Insufficient documentation

## 2021-06-15 DIAGNOSIS — F17210 Nicotine dependence, cigarettes, uncomplicated: Secondary | ICD-10-CM | POA: Insufficient documentation

## 2021-06-15 DIAGNOSIS — F32A Depression, unspecified: Secondary | ICD-10-CM | POA: Diagnosis not present

## 2021-06-15 DIAGNOSIS — G4733 Obstructive sleep apnea (adult) (pediatric): Secondary | ICD-10-CM | POA: Diagnosis not present

## 2021-06-15 HISTORY — PX: COLONOSCOPY WITH PROPOFOL: SHX5780

## 2021-06-15 SURGERY — COLONOSCOPY WITH PROPOFOL
Anesthesia: General

## 2021-06-15 MED ORDER — LIDOCAINE HCL (CARDIAC) PF 100 MG/5ML IV SOSY
PREFILLED_SYRINGE | INTRAVENOUS | Status: DC | PRN
  Administered 2021-06-15: 60 mg via INTRAVENOUS

## 2021-06-15 MED ORDER — PROPOFOL 500 MG/50ML IV EMUL
INTRAVENOUS | Status: DC | PRN
  Administered 2021-06-15: 140 ug/kg/min via INTRAVENOUS

## 2021-06-15 MED ORDER — PROPOFOL 10 MG/ML IV BOLUS
INTRAVENOUS | Status: DC | PRN
  Administered 2021-06-15: 70 mg via INTRAVENOUS

## 2021-06-15 MED ORDER — SODIUM CHLORIDE 0.9 % IV SOLN: INTRAVENOUS | Status: DC

## 2021-06-15 NOTE — Transfer of Care (Signed)
Immediate Anesthesia Transfer of Care Note  Patient: Sarah Barnes  Procedure(s) Performed: COLONOSCOPY WITH PROPOFOL  Patient Location: PACU and Endoscopy Unit  Anesthesia Type:General  Level of Consciousness: drowsy  Airway & Oxygen Therapy: Patient Spontanous Breathing  Post-op Assessment: Report given to RN and Post -op Vital signs reviewed and stable  Post vital signs: Reviewed and stable  Last Vitals:  Vitals Value Taken Time  BP 135/73 06/15/21 1011  Temp 35.8 C 06/15/21 1011  Pulse 61 06/15/21 1012  Resp 21 06/15/21 1012  SpO2 98 % 06/15/21 1012  Vitals shown include unvalidated device data.  Last Pain:  Vitals:   06/15/21 1011  TempSrc: Temporal  PainSc: Asleep         Complications: No notable events documented.

## 2021-06-15 NOTE — H&P (Signed)
Sarah Bellows, MD [address], [place], [place], [place], [address] [address], [place], [place], [place], [address] Phone: [phone]  Fax: [phone]  Primary Care Physician:  [name], MD   Pre-Procedure History & Physical: HPI:  Sarah Barnes is a 71 y.o. female is here for an colonoscopy.   Past Medical History:  Diagnosis Date   Agoraphobia with panic disorder    Anemia    Arthritis    "all over" (08/13/2016)   Chronic bronchitis (HCC)    COPD (chronic obstructive pulmonary disease) ([place])    Cryptogenic stroke ([facility])    a. 06/2020 Head CT: low density caudate nucleus concerning for infarct; b. 07/2020 s/p MDT Linq; c. 11/2020 Finding of Afib on Linq.   Depression    Dyspnea    occ   Dysrhythmia    palpitations occ due to anxiety   ETOH abuse    GAD (generalized anxiety disorder)    GERD (gastroesophageal reflux disease)    Headache    "daily til I got glasses; now have headache once in awhile" (08/13/2016)   Hepatitis 1960   "isolated &  hospitalized for 2 weeks; don't know which kind of hepatitis"    Hernia, hiatal    High cholesterol    Hypertension    Hypertrophic obstructive cardiomyopathy (HCC)    Mild aortic stenosis    a. 01/2019 Echo: EF 55-60%. Mild AS; b. 12/2019 Echo: Nl EF. Mild diast dysfxn. Trace AS/AI/TR. Mild MR.   Nonobstructive CAD (coronary artery disease)    a. 12/2016 Cath: LM nl, LAD min irregs, LCX nl, OM1/2 min irregs, RCA nl, RPDA min irregs. EF 55-65%.   PAD (peripheral artery disease) ([facility])    a. 09/29/2020 s/p PTA/DBA to L prox/mid SFA and stenting of L SFA; b. 10/06/2020 PTA of R TP trunk and prox peroenal. PTA/DBA of R SFA and prox R Popliteal. R SFA stenting x 2; c. 10/2020 ABI: R 0.92, L 0.93.   PAF (paroxysmal atrial fibrillation) ([facility])    a.11/2020 PAF noted on Linq; b. CHA2DS2VASc = 6-->eliquis.   Pneumonia    "once or twice" (08/13/2016)   PONV (postoperative nausea and vomiting)    Sciatica    Tobacco abuse      Past Surgical History:  Procedure Laterality Date   ANAL FISTULECTOMY  1970s X 3   BACK SURGERY     COLONOSCOPY WITH PROPOFOL N/A 04/24/2021   Procedure: COLONOSCOPY WITH PROPOFOL;  Surgeon: Sarah Bellows, MD;  Location: [facility] ENDOSCOPY;  Service: Gastroenterology;  Laterality: N/A;   FRACTURE SURGERY     HERNIA REPAIR     KNEE ARTHROSCOPY Right    LAPAROSCOPIC CHOLECYSTECTOMY     LAPAROSCOPIC INCISIONAL / UMBILICAL / VENTRAL HERNIA REPAIR  08/13/2016   Ruby w/mesh   LOWER EXTREMITY ANGIOGRAPHY Left 09/29/2020   Procedure: LOWER EXTREMITY ANGIOGRAPHY;  Surgeon: Algernon Huxley, MD;  Location: [facility] CV [facility];  Service: Cardiovascular;  Laterality: Left;   LOWER EXTREMITY ANGIOGRAPHY Right 10/06/2020   Procedure: LOWER EXTREMITY ANGIOGRAPHY;  Surgeon: Algernon Huxley, MD;  Location: [facility];  Service: Cardiovascular;  Laterality: Right;   LUMBAR DISC SURGERY     "herniated disc"   NASAL FRACTURE SURGERY  1970s X 2   RIGHT/LEFT HEART CATH AND CORONARY ANGIOGRAPHY N/A 12/27/2016   Procedure: RIGHT/LEFT HEART CATH AND CORONARY ANGIOGRAPHY;  Surgeon: Leonie Man, MD;  Location: [facility]ASIVE [facility] LAB: Mild-moderate pulmonary hypertension.  Hyperdynamic ventricle  noted.  EF 55-65% -hyperdynamic (unable to measure LVOT gradient).  Mildly elevated very tortuous but angiographically normal coronary arteries, suggesting hypertensive heart disease   SHOULDER ARTHROSCOPY WITH ROTATOR CUFF REPAIR Right 1990s?   TONSILLECTOMY  1951   TRANSTHORACIC ECHOCARDIOGRAM  12/2016    EF 60-65% with dynamic outflow tract obstruction at rest. Peak gradient 52 mmHg consistent with HOCM.  GRII DD area. Aortic sclerosis but no stenosis. Systolic anterior motion of mitral valve chordae. Mild mitral stenosis. Moderate LA dilation and mild RA dilation. Peak PA pressures 35 mmHg.   VENTRAL HERNIA REPAIR N/A 08/13/2016   Procedure: LAPAROSCOPIC VENTRAL HERNIA REPAIR WITH MESH;  Surgeon: Judeth Horn, MD;   Location: [facility];  Service: General;  Laterality: N/A;    Prior to Admission medications   Medication Sig Start Date End Date Taking? Authorizing Provider  atorvastatin (LIPITOR) 40 MG tablet TAKE 1 TABLET BY MOUTH  DAILY 05/04/21  Yes McDonough, Lauren K, PA-C  busPIRone (BUSPAR) 15 MG tablet TAKE 1 TABLET BY MOUTH  TWICE DAILY 12/02/20  Yes [name], MD  carvedilol (COREG) 12.5 MG tablet Take by mouth 2 (two) times daily. 11/19/19  Yes [provider]  celecoxib (CELEBREX) 100 MG capsule TAKE 1 CAPSULE BY MOUTH  DAILY 02/05/21  Yes McDonough, Lauren K, PA-C  cilostazol (PLETAL) 50 MG tablet TAKE 1 TABLET BY MOUTH  TWICE DAILY 10/09/20  Yes [name], MD  escitalopram (LEXAPRO) 10 MG tablet TAKE 1 TABLET BY MOUTH  DAILY 01/27/21  Yes McDonough, Lauren K, PA-C  fenofibrate micronized (LOFIBRA) 134 MG capsule Take 1 capsule (134 mg total) by mouth daily before breakfast. NEEDS APPOINTMENT FOR FUTURE REFILLS 06/20/18  Yes Leonie Man, MD  folic acid (FOLVITE) 1 MG tablet Take 1 tablet (1 mg total) by mouth daily. 01/28/19  Yes Demetrios Loll, MD  gabapentin (NEURONTIN) 100 MG capsule TAKE 2 CAPSULES BY MOUTH IN THE MORNING, 1 CAPSULE IN  THE AFTERNOON AND 2  CAPSULES AT NIGHT 11/22/20  Yes [name], MD  hydrOXYzine (VISTARIL) 25 MG capsule TAKE 1 CAPSULE BY MOUTH  EVERY 8 HOURS AS NEEDED 03/03/21  Yes McDonough, Lauren K, PA-C  ibandronate (BONIVA) 150 MG tablet Take one  tab po once a month  in the morning with a full glass of water, on an empty stomach, and do not take anything else by mouth or lie down for the next 30 min. 01/31/21  Yes McDonough, Si Gaul, PA-C  Multiple Vitamin (MULTIVITAMIN WITH MINERALS) TABS tablet Take 1 tablet by mouth daily. 12/29/16  Yes Barton Dubois, MD  pantoprazole (PROTONIX) 40 MG tablet TAKE 1 TABLET BY MOUTH  DAILY 03/08/21  Yes McDonough, Lauren K, PA-C  Potassium 99 MG TABS Take by mouth.   Yes [provider]  apixaban (ELIQUIS) 5 MG TABS  tablet Take 1 tablet (5 mg total) by mouth 2 (two) times daily. 12/09/20   Theora Gianotti, NP  buPROPion (WELLBUTRIN XL) 150 MG 24 hr tablet TAKE 1 TABLET BY MOUTH  DAILY 09/06/20   McDonough, Si Gaul, PA-C  furosemide (LASIX) 40 MG tablet TAKE 1 TABLET BY MOUTH  DAILY 09/06/20   McDonough, Si Gaul, PA-C  meclizine (ANTIVERT) 25 MG tablet Take 1 tablet (25 mg total) by mouth 3 (three) times daily as needed for dizziness. Patient not taking: Reported on 06/14/2021 07/07/20   Ward, Delice Bison, DO  tiZANidine (ZANAFLEX) 2 MG tablet TAKE 1 TABLET BY MOUTH  EVERY 8 HOURS  AS NEEDED FOR MUSCLE SPASM(S) 12/02/20   [name], MD  traMADol (ULTRAM) 50 MG tablet Take 1 tablet (50 mg total) by mouth every 12 (twelve) hours as needed. 06/07/21   McDonough, Si Gaul, PA-C  traZODone (DESYREL) 100 MG tablet TAKE 1 TABLET BY MOUTH  DAILY AT BEDTIME 08/25/20   [name], MD  valsartan (DIOVAN) 160 MG tablet TAKE 1 TABLET BY MOUTH  DAILY 11/21/20   [name], MD    Allergies as of 05/29/2021   (No Known Allergies)    Family History  Problem Relation Age of Onset   Diabetes Mother    Hyperlipidemia Mother    Hypertension Mother    Heart attack Mother 65   Angina Mother        chronic problem   Hypertension Father    Stroke Father    Cancer Brother     Social History   Socioeconomic History   Marital status: Single    Spouse name: Not on file   Number of children: Not on file   Years of education: Not on file   Highest education level: Not on file  Occupational History   Not on file  Tobacco Use   Smoking status: Every Day    Packs/day: 1.00    Years: 49.00    Pack years: 49.00    Types: Cigarettes   Smokeless tobacco: Never  Vaping Use   Vaping Use: Former  Substance and Sexual Activity   Alcohol use: Not Currently    Alcohol/week: 84.0 standard drinks    Types: 36 Cans of beer, 48 Shots of liquor per week    Comment: occ   Drug use: Not Currently    Frequency: 1.0  times per week    Types: Marijuana, Cocaine    Comment: Cocaine + March 2017   Sexual activity: Not Currently  Other Topics Concern   Not on file  Social History Narrative   Brier has generalized anxiety disorder and clearcut Agoraphobia.   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    She currently lives with her sister.   Currently denies substance abuse beyond Tobacco (1-1/2 PPD) & EtOH (beer, vodka) - not ready to discuss quitting.   Social Determinants of Health   Financial Resource Strain: Not on file  Food Insecurity: Not on file  Transportation Needs: Not on file  Physical Activity: Not on file  Stress: Not on file  Social Connections: Not on file  Intimate Partner Violence: Not on file    Review of Systems: See HPI, otherwise negative ROS  Physical Exam: BP (!) 142/100    Pulse 67    Temp (!) 96.7 F (35.9 C) (Temporal)    Resp 20    Ht 5\' 3"  (1.6 m)    Wt 74.8 kg    SpO2 97%    BMI 29.21 kg/m  General:   Alert,  pleasant and cooperative in NAD Head:  Normocephalic and atraumatic. Neck:  Supple; no masses or thyromegaly. Lungs:  Clear throughout to auscultation, normal respiratory effort.    Heart:  +S1, +S2, Regular rate and rhythm, No edema. Abdomen:  Soft, nontender and nondistended. Normal bowel sounds, without guarding, and without rebound.   Neurologic:  Alert and  oriented x4;  grossly normal neurologically.  Impression/Plan: MARGUERITA STAPP is here for an colonoscopy to be  performed for Screening colonoscopy average risk   Risks, benefits, limitations, and alternatives regarding  colonoscopy have been reviewed with the patient.  Questions have been answered.  All parties agreeable.   Sarah Bellows, MD  06/15/2021, 9:29 AM

## 2021-06-15 NOTE — Anesthesia Preprocedure Evaluation (Addendum)
Anesthesia Evaluation  Patient identified by MRN, date of birth, ID band Patient awake    Reviewed: Allergy & Precautions, NPO status , Patient's Chart, lab work & pertinent test results  History of Anesthesia Complications (+) PONV and history of anesthetic complications  Airway Mallampati: IV   Neck ROM: Full    Dental  (+) Poor Dentition   Pulmonary COPD, Current Smoker (1 ppd)Patient did not abstain from smoking.,    Pulmonary exam normal breath sounds clear to auscultation       Cardiovascular hypertension, + CAD (nonobstructive), + Peripheral Vascular Disease (s/p LE stents) and +CHF  Normal cardiovascular exam+ dysrhythmias (paroxysmal a fib) + Valvular Problems/Murmurs (mild) AS  Rhythm:Regular Rate:Normal  HOCM  ECG 05/05/21: NSR   Neuro/Psych PSYCHIATRIC DISORDERS (agoraphobia) Anxiety Depression Alcohol use disorder, none in a month CVA (on Eliquis, residual right eye vision loss)    GI/Hepatic GERD  ,  Endo/Other  negative endocrine ROS  Renal/GU      Musculoskeletal  (+) Arthritis ,   Abdominal   Peds  Hematology   Anesthesia Other Findings Reviewed 05/05/21 cardiology note.  Reproductive/Obstetrics                            Anesthesia Physical Anesthesia Plan  ASA: 4  Anesthesia Plan: General   Post-op Pain Management:    Induction: Intravenous  PONV Risk Score and Plan: 3 and Propofol infusion, TIVA and Treatment may vary due to age or medical condition  Airway Management Planned: Natural Airway  Additional Equipment:   Intra-op Plan:   Post-operative Plan:   Informed Consent: I have reviewed the patients History and Physical, chart, labs and discussed the procedure including the risks, benefits and alternatives for the proposed anesthesia with the patient or authorized representative who has indicated his/her understanding and acceptance.       Plan  Discussed with: CRNA  Anesthesia Plan Comments: (LMA/GETA backup discussed.  Patient consented for risks of anesthesia including but not limited to:  - adverse reactions to medications - damage to eyes, teeth, lips or other oral mucosa - nerve damage due to positioning  - sore throat or hoarseness - damage to heart, brain, nerves, lungs, other parts of body or loss of life  Informed patient about role of CRNA in peri- and intra-operative care.  Patient voiced understanding.)        Anesthesia Quick Evaluation

## 2021-06-15 NOTE — Op Note (Signed)
[facility] Gastroenterology Patient Name: Sarah Barnes Procedure Date: 06/15/2021 9:28 AM MRN: [id number] Account #: [id number] Date of Birth: 1950/10/09 Admit Type: Outpatient Age: 71 Room: [facility] ENDO ROOM 3 Gender: Female Note Status: Finalized Instrument Name: Jasper Riling [id number] Procedure:             Colonoscopy Indications:           Screening for colorectal malignant neoplasm Providers:             [name] MD, MD Referring MD:          [name], MD (Referring MD) Medicines:             Monitored Anesthesia Care Complications:         No immediate complications. Procedure:             Pre-Anesthesia Assessment:                        - Prior to the procedure, a History and Physical was                         performed, and patient medications, allergies and                         sensitivities were reviewed. The patient's tolerance                         of previous anesthesia was reviewed.                        - The risks and benefits of the procedure and the                         sedation options and risks were discussed with the                         patient. All questions were answered and informed                         consent was obtained.                        - ASA Grade Assessment: II - A patient with mild                         systemic disease.                        - ASA Grade Assessment: II - A patient with mild                         systemic disease.                        After obtaining informed consent, the colonoscope was                         passed under direct vision. Throughout the procedure,                         the patient's blood  pressure, pulse, and oxygen                         saturations were monitored continuously. The                         Colonoscope was introduced through the anus and                         advanced to the the cecum, identified by the                         appendiceal  orifice. The colonoscopy was performed                         with ease. The patient tolerated the procedure well.                         The quality of the bowel preparation was excellent. Findings:      The perianal and digital rectal examinations were normal.      Multiple small-mouthed diverticula were found in the sigmoid colon.      Three sessile polyps were found in the descending colon. The polyps were       4 to 6 mm in size. These polyps were removed with a cold snare.       Resection and retrieval were complete.      A 5 mm polyp was found in the rectum. The polyp was sessile. The polyp       was removed with a cold snare. Resection and retrieval were complete.      The exam was otherwise without abnormality on direct and retroflexion       views. Impression:            - Diverticulosis in the sigmoid colon.                        - Three 4 to 6 mm polyps in the descending colon,                         removed with a cold snare. Resected and retrieved.                        - One 5 mm polyp in the rectum, removed with a cold                         snare. Resected and retrieved.                        - The examination was otherwise normal on direct and                         retroflexion views. Recommendation:        - Discharge patient to home (with escort).                        - Resume previous diet.                        - Continue present medications.                        -  Await pathology results.                        - Repeat colonoscopy for surveillance based on                         pathology results. Procedure Code(s):     --- Professional ---                        [phone], Colonoscopy, flexible; with removal of                         tumor(s), polyp(s), or other lesion(s) by snare                         technique Diagnosis Code(s):     --- Professional ---                        Z12.11, Encounter for screening for malignant neoplasm                          of colon                        K63.5, Polyp of colon                        K62.1, Rectal polyp                        K57.30, Diverticulosis of large intestine without                         perforation or abscess without bleeding CPT copyright 2019 American Medical Association. All rights reserved. The codes documented in this report are preliminary and upon coder review may  be revised to meet current compliance requirements. [name], MD [name] MD, MD 06/15/2021 10:11:14 AM This report has been signed electronically. Number of Addenda: 0 Note Initiated On: 06/15/2021 9:28 AM Scope Withdrawal Time: 0 hours 10 minutes 28 seconds  Total Procedure Duration: 0 hours 12 minutes 7 seconds  Estimated Blood Loss:  Estimated blood loss: none.      [facility]

## 2021-06-15 NOTE — Anesthesia Postprocedure Evaluation (Signed)
Anesthesia Post Note  Patient: Sarah Barnes  Procedure(s) Performed: COLONOSCOPY WITH PROPOFOL  Patient location during evaluation: PACU Anesthesia Type: General Level of consciousness: awake and alert, oriented and patient cooperative Pain management: pain level controlled Vital Signs Assessment: post-procedure vital signs reviewed and stable Respiratory status: spontaneous breathing, nonlabored ventilation and respiratory function stable Cardiovascular status: blood pressure returned to baseline and stable Postop Assessment: adequate PO intake Anesthetic complications: no   No notable events documented.   Last Vitals:  Vitals:   06/15/21 1021 06/15/21 1031  BP: (!) 153/92 (!) 166/93  Pulse: (!) 59 60  Resp: 15 17  Temp:    SpO2: 100% 100%    Last Pain:  Vitals:   06/15/21 1031  TempSrc:   PainSc: 0-No pain                 Darrin Nipper

## 2021-06-16 ENCOUNTER — Telehealth: Payer: Self-pay

## 2021-06-16 ENCOUNTER — Encounter: Payer: Self-pay | Admitting: Gastroenterology

## 2021-06-16 ENCOUNTER — Other Ambulatory Visit: Payer: Self-pay | Admitting: Internal Medicine

## 2021-06-16 ENCOUNTER — Other Ambulatory Visit: Payer: Self-pay

## 2021-06-16 DIAGNOSIS — F411 Generalized anxiety disorder: Secondary | ICD-10-CM

## 2021-06-16 LAB — SURGICAL PATHOLOGY

## 2021-06-16 MED ORDER — AZITHROMYCIN 250 MG PO TABS: 250.0000 mg | ORAL_TABLET | Freq: Every day | ORAL | 0 refills | Status: DC

## 2021-06-16 MED ORDER — PREDNISONE 10 MG PO TABS: 10.0000 mg | ORAL_TABLET | Freq: Every day | ORAL | 0 refills | Status: DC

## 2021-06-16 NOTE — Telephone Encounter (Signed)
Pt called and informed us that she was exposed to covid and she tested this morning and tested positive for covid.  She advised she has HA and a sore throat.  Per lauren we sent in zpak and prednisone to her pharmacy

## 2021-06-16 NOTE — Telephone Encounter (Signed)
Pt called and advised she tested positive for covid. HA's and sore throat.  Per lauren we sent prednisone and azithromycin to pharmacy.

## 2021-06-19 ENCOUNTER — Ambulatory Visit (INDEPENDENT_AMBULATORY_CARE_PROVIDER_SITE_OTHER): Payer: Medicare Other

## 2021-06-19 DIAGNOSIS — I639 Cerebral infarction, unspecified: Secondary | ICD-10-CM

## 2021-06-19 LAB — CUP PACEART REMOTE DEVICE CHECK
Date Time Interrogation Session: 20230130105823
Implantable Pulse Generator Implant Date: 20220330

## 2021-06-21 ENCOUNTER — Encounter: Payer: Self-pay | Admitting: Gastroenterology

## 2021-06-21 ENCOUNTER — Encounter: Payer: Commercial Managed Care - HMO | Admitting: Physical Therapy

## 2021-06-25 ENCOUNTER — Other Ambulatory Visit: Payer: Self-pay | Admitting: Physician Assistant

## 2021-06-25 DIAGNOSIS — F411 Generalized anxiety disorder: Secondary | ICD-10-CM

## 2021-06-26 NOTE — Progress Notes (Signed)
Carelink Summary Report / Loop Recorder 

## 2021-06-27 ENCOUNTER — Ambulatory Visit: Payer: Medicare Other | Attending: Physician Assistant | Admitting: Physical Therapy

## 2021-06-27 ENCOUNTER — Telehealth: Payer: Self-pay

## 2021-06-27 DIAGNOSIS — R262 Difficulty in walking, not elsewhere classified: Secondary | ICD-10-CM | POA: Insufficient documentation

## 2021-06-27 DIAGNOSIS — M25652 Stiffness of left hip, not elsewhere classified: Secondary | ICD-10-CM | POA: Insufficient documentation

## 2021-06-27 DIAGNOSIS — M6281 Muscle weakness (generalized): Secondary | ICD-10-CM | POA: Insufficient documentation

## 2021-06-27 NOTE — Telephone Encounter (Signed)
Patient did not show up for 1015 appointment today.  Author called patient to follow-up, patient reports she was unaware of today's appointment.  Patient also not aware of next appointment on Thursday, February 9.  Patient made aware of next appointment and confirmed plans to attend appointment on February 9.  10:37 AM, 06/27/21 Etta Grandchild, PT, DPT Physical Therapist - [place] [phone] (Office)

## 2021-06-29 ENCOUNTER — Emergency Department: Payer: Medicare Other

## 2021-06-29 ENCOUNTER — Emergency Department
Admission: EM | Admit: 2021-06-29 | Discharge: 2021-06-29 | Disposition: A | Discharge status: Home / Self Care | Payer: Medicare Other | Attending: Emergency Medicine | Admitting: Emergency Medicine

## 2021-06-29 ENCOUNTER — Ambulatory Visit: Payer: Medicare Other | Admitting: Physical Therapy

## 2021-06-29 ENCOUNTER — Telehealth: Payer: Self-pay

## 2021-06-29 DIAGNOSIS — Z7901 Long term (current) use of anticoagulants: Secondary | ICD-10-CM | POA: Diagnosis not present

## 2021-06-29 DIAGNOSIS — J449 Chronic obstructive pulmonary disease, unspecified: Secondary | ICD-10-CM | POA: Insufficient documentation

## 2021-06-29 DIAGNOSIS — N390 Urinary tract infection, site not specified: Secondary | ICD-10-CM | POA: Diagnosis not present

## 2021-06-29 DIAGNOSIS — W06XXXA Fall from bed, initial encounter: Secondary | ICD-10-CM | POA: Insufficient documentation

## 2021-06-29 DIAGNOSIS — S0990XA Unspecified injury of head, initial encounter: Secondary | ICD-10-CM

## 2021-06-29 DIAGNOSIS — S62601A Fracture of unspecified phalanx of left index finger, initial encounter for closed fracture: Secondary | ICD-10-CM

## 2021-06-29 DIAGNOSIS — R040 Epistaxis: Secondary | ICD-10-CM

## 2021-06-29 DIAGNOSIS — S62641A Nondisplaced fracture of proximal phalanx of left index finger, initial encounter for closed fracture: Secondary | ICD-10-CM | POA: Diagnosis not present

## 2021-06-29 DIAGNOSIS — I10 Essential (primary) hypertension: Secondary | ICD-10-CM | POA: Insufficient documentation

## 2021-06-29 DIAGNOSIS — Z79899 Other long term (current) drug therapy: Secondary | ICD-10-CM | POA: Diagnosis not present

## 2021-06-29 DIAGNOSIS — R58 Hemorrhage, not elsewhere classified: Secondary | ICD-10-CM | POA: Diagnosis not present

## 2021-06-29 DIAGNOSIS — W19XXXA Unspecified fall, initial encounter: Secondary | ICD-10-CM

## 2021-06-29 LAB — CBC WITH DIFFERENTIAL/PLATELET
Abs Immature Granulocytes: 0.06 10*3/uL (ref 0.00–0.07)
Basophils Absolute: 0 10*3/uL (ref 0.0–0.1)
Basophils Relative: 0 %
Eosinophils Absolute: 0.1 10*3/uL (ref 0.0–0.5)
Eosinophils Relative: 1 %
HCT: 44.3 % (ref 36.0–46.0)
Hemoglobin: 15.2 g/dL — ABNORMAL HIGH (ref 12.0–15.0)
Immature Granulocytes: 1 %
Lymphocytes Relative: 27 %
Lymphs Abs: 3.2 10*3/uL (ref 0.7–4.0)
MCH: 31.1 pg (ref 26.0–34.0)
MCHC: 34.3 g/dL (ref 30.0–36.0)
MCV: 90.8 fL (ref 80.0–100.0)
Monocytes Absolute: 0.8 10*3/uL (ref 0.1–1.0)
Monocytes Relative: 7 %
Neutro Abs: 7.5 10*3/uL (ref 1.7–7.7)
Neutrophils Relative %: 64 %
Platelets: 186 10*3/uL (ref 150–400)
RBC: 4.88 MIL/uL (ref 3.87–5.11)
RDW: 12.5 % (ref 11.5–15.5)
WBC: 11.7 10*3/uL — ABNORMAL HIGH (ref 4.0–10.5)
nRBC: 0 % (ref 0.0–0.2)

## 2021-06-29 LAB — URINALYSIS, ROUTINE W REFLEX MICROSCOPIC
Bilirubin Urine: NEGATIVE
Glucose, UA: NEGATIVE mg/dL
Ketones, ur: NEGATIVE mg/dL
Nitrite: NEGATIVE
Protein, ur: 30 mg/dL — AB
RBC / HPF: >50 RBC/hpf — ABNORMAL HIGH (ref 0–5)
Specific Gravity, Urine: 1.016 (ref 1.005–1.030)
pH: 5 (ref 5.0–8.0)

## 2021-06-29 LAB — COMPREHENSIVE METABOLIC PANEL
ALT: 16 U/L (ref 0–44)
AST: 22 U/L (ref 15–41)
Albumin: 3.8 g/dL (ref 3.5–5.0)
Alkaline Phosphatase: 50 U/L (ref 38–126)
Anion gap: 7 (ref 5–15)
BUN: 15 mg/dL (ref 8–23)
CO2: 24 mmol/L (ref 22–32)
Calcium: 8.2 mg/dL — ABNORMAL LOW (ref 8.9–10.3)
Chloride: 110 mmol/L (ref 98–111)
Creatinine, Ser: 0.63 mg/dL (ref 0.44–1.00)
GFR, Estimated: >60 mL/min (ref >60)
Glucose, Bld: 153 mg/dL — ABNORMAL HIGH (ref 70–99)
Potassium: 3.2 mmol/L — ABNORMAL LOW (ref 3.5–5.1)
Sodium: 141 mmol/L (ref 135–145)
Total Bilirubin: 0.7 mg/dL (ref 0.3–1.2)
Total Protein: 6.6 g/dL (ref 6.5–8.1)

## 2021-06-29 LAB — LIPASE, BLOOD: Lipase: 39 U/L (ref 11–51)

## 2021-06-29 MED ORDER — NITROFURANTOIN MONOHYD MACRO 100 MG PO CAPS: 100.0000 mg | ORAL_CAPSULE | Freq: Two times a day (BID) | ORAL | 0 refills | Status: AC

## 2021-06-29 MED ORDER — NITROFURANTOIN MONOHYD MACRO 100 MG PO CAPS
100.0000 mg | ORAL_CAPSULE | Freq: Once | ORAL | Status: AC
  Administered 2021-06-29: 100 mg via ORAL
  Filled 2021-06-29: qty 1

## 2021-06-29 MED ORDER — NITROFURANTOIN MONOHYD MACRO 100 MG PO CAPS: 100.0000 mg | ORAL_CAPSULE | Freq: Two times a day (BID) | ORAL | 0 refills | Status: DC

## 2021-06-29 MED ORDER — CEPHALEXIN 500 MG PO CAPS: 500.0000 mg | ORAL_CAPSULE | Freq: Once | ORAL | Status: DC

## 2021-06-29 NOTE — Discharge Instructions (Addendum)
You may use nasal saline to keep your mucous membranes/nasal cavities moist. You may use a humidifier in your house or bedroom when sleeping.  Other than nasal saline, please do not put anything into your nose for the next 3-4 days. Please do not blow your nose for the next 3-4 days. If your nose begins to bleed again, at this time it is okay to blow your nose and blow out all of the clots and then spray Afrin nasal spray into both nostrils and hold direct pressure for 30 minutes without stopping. If this does not stop the bleeding, please return to the hospital.   You may take Tylenol 1000 mg every 6 hours as needed for pain.

## 2021-06-29 NOTE — ED Triage Notes (Signed)
See paper chart for downtime 

## 2021-06-29 NOTE — ED Provider Notes (Addendum)
[facility] Provider Note    Event Date/Time   First MD Initiated Contact with Patient 06/29/21 [phone]     (approximate)   History   Fall and Diarrhea   HPI  Sarah Barnes is a 71 y.o. female with history of COPD, hypertension, hyperlipidemia, peripheral arterial disease, A-fib on Eliquis who presents to the emergency department after she fell out of bed.  States she was having a left-sided nosebleed that has now stopped.  She is having a mild headache.  She is also having left index finger pain.  No preceding chest pain or shortness of breath.  Has had some diarrhea recently.  No vomiting or abdominal pain.  No fever.   History provided by patient.    Past Medical History:  Diagnosis Date   Agoraphobia with panic disorder    Anemia    Arthritis    "all over" (08/13/2016)   Chronic bronchitis (HCC)    COPD (chronic obstructive pulmonary disease) ([place])    Cryptogenic stroke ([place])    a. 06/2020 Head CT: low density caudate nucleus concerning for infarct; b. 07/2020 s/p MDT Linq; c. 11/2020 Finding of Afib on Linq.   Depression    Dyspnea    occ   Dysrhythmia    palpitations occ due to anxiety   ETOH abuse    GAD (generalized anxiety disorder)    GERD (gastroesophageal reflux disease)    Headache    "daily til I got glasses; now have headache once in awhile" (08/13/2016)   Hepatitis 1960   "isolated &  hospitalized for 2 weeks; don't know which kind of hepatitis"    Hernia, hiatal    High cholesterol    Hypertension    Hypertrophic obstructive cardiomyopathy (HCC)    Mild aortic stenosis    a. 01/2019 Echo: EF 55-60%. Mild AS; b. 12/2019 Echo: Nl EF. Mild diast dysfxn. Trace AS/AI/TR. Mild MR.   Nonobstructive CAD (coronary artery disease)    a. 12/2016 Cath: LM nl, LAD min irregs, LCX nl, OM1/2 min irregs, RCA nl, RPDA min irregs. EF 55-65%.   PAD (peripheral artery disease) ([place])    a. 09/29/2020 s/p PTA/DBA to L prox/mid SFA and stenting of L  SFA; b. 10/06/2020 PTA of R TP trunk and prox peroenal. PTA/DBA of R SFA and prox R Popliteal. R SFA stenting x 2; c. 10/2020 ABI: R 0.92, L 0.93.   PAF (paroxysmal atrial fibrillation) ([place])    a.11/2020 PAF noted on Linq; b. CHA2DS2VASc = 6-->eliquis.   Pneumonia    "once or twice" (08/13/2016)   PONV (postoperative nausea and vomiting)    Sciatica    Tobacco abuse     Past Surgical History:  Procedure Laterality Date   ANAL FISTULECTOMY  1970s X 3   BACK SURGERY     COLONOSCOPY WITH PROPOFOL N/A 04/24/2021   Procedure: COLONOSCOPY WITH PROPOFOL;  Surgeon: [name], MD;  Location: [facility] ENDOSCOPY;  Service: Gastroenterology;  Laterality: N/A;   COLONOSCOPY WITH PROPOFOL N/A 06/15/2021   Procedure: COLONOSCOPY WITH PROPOFOL;  Surgeon: [name], MD;  Location[facility] END[facility]COPY;  Service: Gastroenterology;  Laterality: N/A;   FRACTURE SURGERY     HERNIA REPAIR     KNEE ARTHROSCOPY Right    LAPAROSCOPIC CHOLECYSTECTOMY     LAPAROSCOPIC INCISIONAL / UMBILICAL / VENTRAL HERNIA REPAIR  08/13/2016   [place] w/mesh   LOWER EXTREMITY ANGIOGRAPHY Left 09/29/2020   Procedure: LOWER EXTREMITY ANGIOGRAPHY;  Surgeon: Algernon Huxley, MD;  Location: [place] CV LAB;  Service: Cardiovascular;  Laterality: Left;   LOWER EXTREMITY ANGIOGRAPHY Right 10/06/2020   Procedure: LOWER EXTREMITY ANGIOGRAPHY;  Surgeon: Algernon Huxley, MD;  Location: [place] CV LAB;  Service: Cardiovascular;  Laterality: Right;   LUMBAR DISC SURGERY     "herniated disc"   NASAL FRACTURE SURGERY  1970s X 2   RIGHT/LEFT HEART CATH AND CORONARY ANGIOGRAPHY N/A 12/27/2016   Procedure: RIGHT/LEFT HEART CATH AND CORONARY ANGIOGRAPHY;  Surgeon: Leonie Man, MD;  Location: [place] INVASIVE CV LAB: Mild-moderate pulmonary hypertension.  Hyperdynamic ventricle noted.  EF 55-65% -hyperdynamic (unable to measure LVOT gradient).  Mildly elevated very tortuous but angiographically normal coronary arteries, suggesting hypertensive heart  disease   SHOULDER ARTHROSCOPY WITH ROTATOR CUFF REPAIR Right 1990s?   TONSILLECTOMY  1951   TRANSTHORACIC ECHOCARDIOGRAM  12/2016    EF 60-65% with dynamic outflow tract obstruction at rest. Peak gradient 52 mmHg consistent with HOCM.  GRII DD area. Aortic sclerosis but no stenosis. Systolic anterior motion of mitral valve chordae. Mild mitral stenosis. Moderate LA dilation and mild RA dilation. Peak PA pressures 35 mmHg.   VENTRAL HERNIA REPAIR N/A 08/13/2016   Procedure: LAPAROSCOPIC VENTRAL HERNIA REPAIR WITH MESH;  Surgeon: Judeth Horn, MD;  Location: [place];  Service: General;  Laterality: N/A;    MEDICATIONS:  Prior to Admission medications   Medication Sig Start Date End Date Taking? Authorizing Provider  apixaban (ELIQUIS) 5 MG TABS tablet Take 1 tablet (5 mg total) by mouth 2 (two) times daily. 12/09/20   Theora Gianotti, NP  atorvastatin (LIPITOR) 40 MG tablet TAKE 1 TABLET BY MOUTH  DAILY 05/04/21   McDonough, Si Gaul, PA-C  azithromycin (ZITHROMAX) 250 MG tablet Take 1 tablet (250 mg total) by mouth daily. Take 1 tablet by mouth daily for 10 days 06/16/21   Mylinda Latina, PA-C  buPROPion (WELLBUTRIN XL) 150 MG 24 hr tablet TAKE 1 TABLET BY MOUTH  DAILY 09/06/20   McDonough, Lauren K, PA-C  busPIRone (BUSPAR) 15 MG tablet TAKE 1 TABLET BY MOUTH  TWICE DAILY 06/16/21   McDonough, Lauren K, PA-C  carvedilol (COREG) 12.5 MG tablet Take by mouth 2 (two) times daily. 11/19/19   [provider]  celecoxib (CELEBREX) 100 MG capsule TAKE 1 CAPSULE BY MOUTH  DAILY 02/05/21   McDonough, Si Gaul, PA-C  cilostazol (PLETAL) 50 MG tablet TAKE 1 TABLET BY MOUTH  TWICE DAILY 10/09/20   [name], MD  escitalopram (LEXAPRO) 10 MG tablet TAKE 1 TABLET BY MOUTH  DAILY 06/26/21   [name], MD  fenofibrate micronized (LOFIBRA) 134 MG capsule Take 1 capsule (134 mg total) by mouth daily before breakfast. NEEDS APPOINTMENT FOR FUTURE REFILLS 06/20/18   Leonie Man, MD  folic  acid (FOLVITE) 1 MG tablet Take 1 tablet (1 mg total) by mouth daily. 01/28/19   Demetrios Loll, MD  furosemide (LASIX) 40 MG tablet TAKE 1 TABLET BY MOUTH  DAILY 09/06/20   McDonough, Si Gaul, PA-C  gabapentin (NEURONTIN) 100 MG capsule TAKE 2 CAPSULES BY MOUTH IN THE MORNING, 1 CAPSULE IN  THE AFTERNOON AND 2  CAPSULES AT NIGHT 11/22/20   [name], MD  hydrOXYzine (VISTARIL) 25 MG capsule TAKE 1 CAPSULE BY MOUTH  EVERY 8 HOURS AS NEEDED 03/03/21   McDonough, Si Gaul, PA-C  ibandronate (BONIVA) 150 MG tablet Take one  tab po once a month  in the morning with a full glass of  water, on an empty stomach, and do not take anything else by mouth or lie down for the next 30 min. 01/31/21   McDonough, Si Gaul, PA-C  meclizine (ANTIVERT) 25 MG tablet Take 1 tablet (25 mg total) by mouth 3 (three) times daily as needed for dizziness. Patient not taking: Reported on 06/14/2021 07/07/20   Kacey Vicuna, Delice Bison, DO  Multiple Vitamin (MULTIVITAMIN WITH MINERALS) TABS tablet Take 1 tablet by mouth daily. 12/29/16   Barton Dubois, MD  pantoprazole (PROTONIX) 40 MG tablet TAKE 1 TABLET BY MOUTH  DAILY 03/08/21   McDonough, Si Gaul, PA-C  Potassium 99 MG TABS Take by mouth.    [provider]  predniSONE (DELTASONE) 10 MG tablet Take 1 tablet (10 mg total) by mouth daily with breakfast. take one tab 3 X day for 3 days, then take one tab 2 X a day for 3 days and then take one tab a day for 3 days 06/16/21   McDonough, Si Gaul, PA-C  tiZANidine (ZANAFLEX) 2 MG tablet TAKE 1 TABLET BY MOUTH  EVERY 8 HOURS AS NEEDED FOR MUSCLE SPASM(S) 12/02/20   [name], MD  traMADol (ULTRAM) 50 MG tablet Take 1 tablet (50 mg total) by mouth every 12 (twelve) hours as needed. 06/07/21   McDonough, Si Gaul, PA-C  traZODone (DESYREL) 100 MG tablet TAKE 1 TABLET BY MOUTH  DAILY AT BEDTIME 08/25/20   [name], MD  valsartan (DIOVAN) 160 MG tablet TAKE 1 TABLET BY MOUTH  DAILY 11/21/20   [name], MD    Physical Exam   Triage  Vital Signs: ED Triage Vitals  Enc Vitals Group     BP      Pulse      Resp      Temp      Temp src      SpO2      Weight      Height      Head Circumference      Peak Flow      Pain Score      Pain Loc      Pain Edu?      Excl. in [place]?     Most recent vital signs: Vitals:   06/29/21 0727 06/29/21 0734  BP:  109/85  Pulse: 75 73  Resp: 12 15  SpO2: 97% 96%     CONSTITUTIONAL: Alert and oriented and responds appropriately to questions. Well-appearing; well-nourished; GCS 15 HEAD: Normocephalic; atraumatic EYES: Conjunctivae clear, PERRL, EOMI ENT: normal nose; no rhinorrhea; moist mucous membranes; pharynx without lesions noted; no dental injury; no septal hematoma, red blood noted in the left nostril without any active bleeding and no blood in the posterior oropharynx, no facial deformity or tenderness NECK: Supple, no midline spinal tenderness, step-off or deformity; trachea midline CARD: RRR; S1 and S2 appreciated; no murmurs, no clicks, no rubs, no gallops RESP: Normal chest excursion without splinting or tachypnea; breath sounds clear and equal bilaterally; no wheezes, no rhonchi, no rales; no hypoxia or respiratory distress CHEST:  chest wall stable, no crepitus or ecchymosis or deformity, nontender to palpation; no flail chest ABD/GI: Normal bowel sounds; non-distended; soft, non-tender, no rebound, no guarding; no ecchymosis or other lesions noted PELVIS:  stable, nontender to palpation BACK:  The back appears normal; no midline spinal tenderness, step-off or deformity EXT: Normal ROM in all joints; to palpation over the left second digit without swelling or ecchymosis and otherwise extremities are non-tender to palpation;  no edema; normal capillary refill; no cyanosis, no bony tenderness or bony deformity of patient's extremities, no joint effusion, compartments are soft, extremities are warm and well-perfused, no ecchymosis SKIN: Normal color for age and race;  warm NEURO: No facial asymmetry, normal speech, moving all extremities equally  ED Results / Procedures / Treatments   LABS: (all labs ordered are listed, but only abnormal results are displayed) Labs Reviewed  CBC WITH DIFFERENTIAL/PLATELET - Abnormal; Notable for the following components:      Result Value   WBC 11.7 (*)    Hemoglobin 15.2 (*)    All other components within normal limits  COMPREHENSIVE METABOLIC PANEL - Abnormal; Notable for the following components:   Potassium 3.2 (*)    Glucose, Bld 153 (*)    Calcium 8.2 (*)    All other components within normal limits  URINALYSIS, ROUTINE W REFLEX MICROSCOPIC - Abnormal; Notable for the following components:   Color, Urine YELLOW (*)    APPearance CLOUDY (*)    Hgb urine dipstick LARGE (*)    Protein, ur 30 (*)    Leukocytes,Ua SMALL (*)    RBC / HPF >50 (*)    Bacteria, UA RARE (*)    All other components within normal limits  URINE CULTURE  LIPASE, BLOOD     EKG:    RADIOLOGY: My personal review and interpretation of imaging: CT scans show no acute abnormality.  X-ray of the left index finger shows nondisplaced avulsion fracture at the base of the phalanx.  I have personally reviewed all radiology reports. CT HEAD WO CONTRAST (5MM)  Result Date: 06/29/2021 CLINICAL DATA:  Head trauma. EXAM: CT HEAD WITHOUT CONTRAST CT MAXILLOFACIAL WITHOUT CONTRAST CT CERVICAL SPINE WITHOUT CONTRAST TECHNIQUE: Multidetector CT imaging of the head, cervical spine, and maxillofacial structures were performed using the standard protocol without intravenous contrast. Multiplanar CT image reconstructions of the cervical spine and maxillofacial structures were also generated. RADIATION DOSE REDUCTION: This exam was performed according to the departmental dose-optimization program which includes automated exposure control, adjustment of the mA and/or kV according to patient size and/or use of iterative reconstruction technique. COMPARISON:   Head CT 07/06/2020 FINDINGS: CT HEAD FINDINGS Brain: No evidence of acute infarction, hemorrhage, hydrocephalus, extra-axial collection or mass lesion/mass effect. Chronic appearing lacune is in the bilateral caudate and right corona radiata. Right caudate head lacune has occurred since prior but is well-defined. Cluster of calcifications along the left occipital convexity without associated swelling or encephalomalacia is seen, likely related to old insult. Area unremarkable by brain MRI 1 year ago. Vascular: Atheromatous calcification Skull: No acute fracture CT MAXILLOFACIAL FINDINGS Osseous: Pinched appearance of the nose, presumably for epistaxis. Associated congestion of nasal mucosa. No discrete and acute nasal bone fracture is seen. Negative for orbital or mandibular fracture. The temporomandibular joints are located. Orbits: No visible injury. Sinuses: Negative for hemosinus. Soft tissues: No hematoma or opaque foreign body. CT CERVICAL SPINE FINDINGS Alignment: No traumatic malalignment. Generalized straightening of the cervical spine Skull base and vertebrae: No acute fracture. No primary bone lesion or focal pathologic process. Soft tissues and spinal canal: No prevertebral fluid or swelling. No visible canal hematoma. Disc levels:  Ordinary degenerative changes. Upper chest: No evidence of injury IMPRESSION: 1. No evidence of acute intracranial or cervical spine injury. 2. No acute facial fracture. 3. Chronic small vessel ischemia with chronic lacunes. A chronic right caudate lacunar infarct has occurred since CT 1 year ago. Electronically Signed   By: Roderic Palau  Watts M.D.   On: 06/29/2021 07:03   CT CERVICAL SPINE WO CONTRAST  Result Date: 06/29/2021 CLINICAL DATA:  Head trauma. EXAM: CT HEAD WITHOUT CONTRAST CT MAXILLOFACIAL WITHOUT CONTRAST CT CERVICAL SPINE WITHOUT CONTRAST TECHNIQUE: Multidetector CT imaging of the head, cervical spine, and maxillofacial structures were performed using the  standard protocol without intravenous contrast. Multiplanar CT image reconstructions of the cervical spine and maxillofacial structures were also generated. RADIATION DOSE REDUCTION: This exam was performed according to the departmental dose-optimization program which includes automated exposure control, adjustment of the mA and/or kV according to patient size and/or use of iterative reconstruction technique. COMPARISON:  Head CT 07/06/2020 FINDINGS: CT HEAD FINDINGS Brain: No evidence of acute infarction, hemorrhage, hydrocephalus, extra-axial collection or mass lesion/mass effect. Chronic appearing lacune is in the bilateral caudate and right corona radiata. Right caudate head lacune has occurred since prior but is well-defined. Cluster of calcifications along the left occipital convexity without associated swelling or encephalomalacia is seen, likely related to old insult. Area unremarkable by brain MRI 1 year ago. Vascular: Atheromatous calcification Skull: No acute fracture CT MAXILLOFACIAL FINDINGS Osseous: Pinched appearance of the nose, presumably for epistaxis. Associated congestion of nasal mucosa. No discrete and acute nasal bone fracture is seen. Negative for orbital or mandibular fracture. The temporomandibular joints are located. Orbits: No visible injury. Sinuses: Negative for hemosinus. Soft tissues: No hematoma or opaque foreign body. CT CERVICAL SPINE FINDINGS Alignment: No traumatic malalignment. Generalized straightening of the cervical spine Skull base and vertebrae: No acute fracture. No primary bone lesion or focal pathologic process. Soft tissues and spinal canal: No prevertebral fluid or swelling. No visible canal hematoma. Disc levels:  Ordinary degenerative changes. Upper chest: No evidence of injury IMPRESSION: 1. No evidence of acute intracranial or cervical spine injury. 2. No acute facial fracture. 3. Chronic small vessel ischemia with chronic lacunes. A chronic right caudate lacunar  infarct has occurred since CT 1 year ago. Electronically Signed   By: Jorje Guild M.D.   On: 06/29/2021 07:03   DG Finger Index Left  Result Date: 06/29/2021 CLINICAL DATA:  Fall EXAM: LEFT INDEX FINGER 2+V COMPARISON:  None. FINDINGS: Avulsion fracture at the medial base of the second proximal phalanx, nondisplaced. Generalized osteopenia. Degenerative interphalangeal spurring. Study performed during epic downtime with prelim placed on images. IMPRESSION: Nondisplaced avulsion fracture at the base of the second proximal phalanx. Electronically Signed   By: Jorje Guild M.D.   On: 06/29/2021 07:10   CT MAXILLOFACIAL WO CONTRAST  Result Date: 06/29/2021 CLINICAL DATA:  Head trauma. EXAM: CT HEAD WITHOUT CONTRAST CT MAXILLOFACIAL WITHOUT CONTRAST CT CERVICAL SPINE WITHOUT CONTRAST TECHNIQUE: Multidetector CT imaging of the head, cervical spine, and maxillofacial structures were performed using the standard protocol without intravenous contrast. Multiplanar CT image reconstructions of the cervical spine and maxillofacial structures were also generated. RADIATION DOSE REDUCTION: This exam was performed according to the departmental dose-optimization program which includes automated exposure control, adjustment of the mA and/or kV according to patient size and/or use of iterative reconstruction technique. COMPARISON:  Head CT 07/06/2020 FINDINGS: CT HEAD FINDINGS Brain: No evidence of acute infarction, hemorrhage, hydrocephalus, extra-axial collection or mass lesion/mass effect. Chronic appearing lacune is in the bilateral caudate and right corona radiata. Right caudate head lacune has occurred since prior but is well-defined. Cluster of calcifications along the left occipital convexity without associated swelling or encephalomalacia is seen, likely related to old insult. Area unremarkable by brain MRI 1 year ago. Vascular: Atheromatous calcification Skull:  No acute fracture CT MAXILLOFACIAL FINDINGS  Osseous: Pinched appearance of the nose, presumably for epistaxis. Associated congestion of nasal mucosa. No discrete and acute nasal bone fracture is seen. Negative for orbital or mandibular fracture. The temporomandibular joints are located. Orbits: No visible injury. Sinuses: Negative for hemosinus. Soft tissues: No hematoma or opaque foreign body. CT CERVICAL SPINE FINDINGS Alignment: No traumatic malalignment. Generalized straightening of the cervical spine Skull base and vertebrae: No acute fracture. No primary bone lesion or focal pathologic process. Soft tissues and spinal canal: No prevertebral fluid or swelling. No visible canal hematoma. Disc levels:  Ordinary degenerative changes. Upper chest: No evidence of injury IMPRESSION: 1. No evidence of acute intracranial or cervical spine injury. 2. No acute facial fracture. 3. Chronic small vessel ischemia with chronic lacunes. A chronic right caudate lacunar infarct has occurred since CT 1 year ago. Electronically Signed   By: Jorje Guild M.D.   On: 06/29/2021 07:03     PROCEDURES:  Critical Care performed: No   SPLINT APPLICATION Date/Time: 7:67 AM Authorized by: Cyril Mourning Kaliq Lege Consent: Verbal consent obtained. Risks and benefits: risks, benefits and alternatives were discussed Consent given by: patient Splint applied by: nurse Location details: Left index finger Splint type: Aluminum finger splint Supplies used: Aluminum finger splint Post-procedure: The splinted body part was neurovascularly unchanged following the procedure. Patient tolerance: Patient tolerated the procedure well with no immediate complications.    Procedures    IMPRESSION / MDM / ASSESSMENT AND PLAN / ED COURSE  I reviewed the triage vital signs and the nursing notes.  Patient here with a fall out of bed.  No preceding symptoms.  Has recently had some diarrhea.     DIFFERENTIAL DIAGNOSIS (includes but not limited to):   Head injury, concussion,  intracranial hemorrhage, skull fracture, finger fracture, finger sprain, epistaxis.  Doubt colitis, diverticulitis, appendicitis.  Has been on antibiotics recently for recurrent UTI but no abdominal pain or diarrhea here.  Lower suspicion for C. difficile.   PLAN: We will obtain CT of the head, face and cervical spine.  Will obtain x-ray of the left finger.  Will give Tylenol for pain control.  Will obtain CBC, CMP, lipase, urine.   MEDICATIONS GIVEN IN ED: Medications  nitrofurantoin (macrocrystal-monohydrate) (MACROBID) capsule 100 mg (100 mg Oral Given 06/29/21 0733)     ED COURSE: CT scans reviewed by myself and radiologist and showed no acute abnormality.  She does have a nondisplaced avulsion fracture to the base of the second left phalanx.  We will place her in an aluminum finger splint.  She does not need orthopedic follow-up for this.  Labs reassuring.  Normal electrolytes.  Minimal leukocytosis.  Glucose normal.  Normal renal function.  Urine appears infected today.  Will send urine culture.  She has previously grown Enterococcus and E. coli both sensitive to Macrobid.  Will discharge on Macrobid today.  Abdominal exam benign.  She has not had any diarrhea here in the emergency department making my suspicion for C. difficile much lower.  Epistaxis has resolved after compression.  No active bleeding after several hours of monitoring.  Discussed nosebleed return precautions and supportive care instructions   At this time, I do not feel there is any life-threatening condition present. I reviewed all nursing notes, vitals, pertinent previous records.  All lab and urine results, EKGs, imaging ordered have been independently reviewed and interpreted by myself.  I reviewed all available radiology reports from any imaging ordered this visit.  Based  on my assessment, I feel the patient is safe to be discharged home without further emergent workup and can continue workup as an outpatient as  needed. Discussed all findings, treatment plan as well as usual and customary return precautions with patient.  They verbalize understanding and are comfortable with this plan.  Outpatient follow-up has been provided as needed.  All questions have been answered.   CONSULTS: No admission needed at this time given reassuring work-up, patient improving.   OUTSIDE RECORDS REVIEWED: Reviewed patient's most recent colonoscopy on 06/15/2021.  Patient had polyps and diverticuli noted.  Labs negative for C. difficile in November 2022.     Please see downtime documentation.    FINAL CLINICAL IMPRESSION(S) / ED DIAGNOSES   Final diagnoses:  Injury of head, initial encounter  Left-sided epistaxis  Acute UTI  Fracture of unspecified phalanx of left index finger, initial encounter for closed fracture     Rx / DC Orders   ED Discharge Orders          Ordered    nitrofurantoin, macrocrystal-monohydrate, (MACROBID) 100 MG capsule  2 times daily,   Status:  Discontinued        06/29/21 0725    nitrofurantoin, macrocrystal-monohydrate, (MACROBID) 100 MG capsule  2 times daily        06/29/21 0725             Note:  This document was prepared using Dragon voice recognition software and may include unintentional dictation errors.   Sarah Barnes, Delice Bison, DO 06/29/21 [place], Delice Bison, DO 06/29/21 [phone]

## 2021-06-29 NOTE — Telephone Encounter (Signed)
Lvm to schedule ED follow up-Toni 

## 2021-06-30 ENCOUNTER — Telehealth: Payer: Self-pay

## 2021-06-30 NOTE — Telephone Encounter (Signed)
Left another vm to schedule ED follow up-Toni

## 2021-07-01 LAB — URINE CULTURE: Culture: <10000 — AB

## 2021-07-04 ENCOUNTER — Ambulatory Visit: Payer: Medicare Other

## 2021-07-04 ENCOUNTER — Encounter: Payer: Self-pay | Admitting: Physical Therapy

## 2021-07-04 ENCOUNTER — Other Ambulatory Visit: Payer: Self-pay

## 2021-07-04 DIAGNOSIS — M6281 Muscle weakness (generalized): Secondary | ICD-10-CM

## 2021-07-04 DIAGNOSIS — M25652 Stiffness of left hip, not elsewhere classified: Secondary | ICD-10-CM

## 2021-07-04 DIAGNOSIS — R262 Difficulty in walking, not elsewhere classified: Secondary | ICD-10-CM

## 2021-07-04 NOTE — Therapy (Signed)
[facility] [address], [place], [address] Phone: [phone]   Fax:  [phone]  Physical Therapy Treatment  Patient Details  Name: Sarah Barnes MRN: [id number] Date of Birth: Sep 27, 1950 Referring Provider (PT): Drema Dallas PA-C   Encounter Date: 07/04/2021   PT End of Session - 07/04/21 1117     Visit Number 2    Number of Visits 16    Date for PT Re-Evaluation 08/09/21    Authorization Type [employer] Medicare primary, [place] Medicaid secondary    Authorization Time Period 06/14/21-08/09/21    Progress Note Due on Visit 10    PT Start Time 1017    PT Stop Time 1058    PT Time Calculation (min) 41 min    Equipment Utilized During Treatment Gait belt    Activity Tolerance Patient tolerated treatment well;No increased pain    Behavior During Therapy WFL for tasks assessed/performed             Past Medical History:  Diagnosis Date   Agoraphobia with panic disorder    Anemia    Arthritis    "all over" (08/13/2016)   Chronic bronchitis (HCC)    COPD (chronic obstructive pulmonary disease) ([place])    Cryptogenic stroke ([place])    a. 06/2020 Head CT: low density caudate nucleus concerning for infarct; b. 07/2020 s/p MDT Linq; c. 11/2020 Finding of Afib on Linq.   Depression    Dyspnea    occ   Dysrhythmia    palpitations occ due to anxiety   ETOH abuse    GAD (generalized anxiety disorder)    GERD (gastroesophageal reflux disease)    Headache    "daily til I got glasses; now have headache once in awhile" (08/13/2016)   Hepatitis 1960   "isolated &  hospitalized for 2 weeks; don't know which kind of hepatitis"    Hernia, hiatal    High cholesterol    Hypertension    Hypertrophic obstructive cardiomyopathy (HCC)    Mild aortic stenosis    a. 01/2019 Echo: EF 55-60%. Mild AS; b. 12/2019 Echo: Nl EF. Mild diast dysfxn. Trace AS/AI/TR. Mild MR.   Nonobstructive CAD (coronary artery disease)    a. 12/2016  Cath: LM nl, LAD min irregs, LCX nl, OM1/2 min irregs, RCA nl, RPDA min irregs. EF 55-65%.   PAD (peripheral artery disease) ([place])    a. 09/29/2020 s/p PTA/DBA to L prox/mid SFA and stenting of L SFA; b. 10/06/2020 PTA of R TP trunk and prox peroenal. PTA/DBA of R SFA and prox R Popliteal. R SFA stenting x 2; c. 10/2020 ABI: R 0.92, L 0.93.   PAF (paroxysmal atrial fibrillation) ([place])    a.11/2020 PAF noted on Linq; b. CHA2DS2VASc = 6-->eliquis.   Pneumonia    "once or twice" (08/13/2016)   PONV (postoperative nausea and vomiting)    Sciatica    Tobacco abuse     Past Surgical History:  Procedure Laterality Date   ANAL FISTULECTOMY  1970s X 3   BACK SURGERY     COLONOSCOPY WITH PROPOFOL N/A 04/24/2021   Procedure: COLONOSCOPY WITH PROPOFOL;  Surgeon: [name], MD;  Location: [facility] ENDOSCOPY;  Service: Gastroenterology;  Laterality: N/A;   COLONOSCOPY WITH PROPOFOL N/A 06/15/2021   Procedure: COLONOSCOPY WITH PROPOFOL;  Surgeon: [name], MD;  Location: [facility] ENDOSCOPY;  Service: Gastroenterology;  Laterality: N/A;   FRACTURE SURGERY     HERNIA REPAIR     KNEE  ARTHROSCOPY Right    LAPAROSCOPIC CHOLECYSTECTOMY     LAPAROSCOPIC INCISIONAL / UMBILICAL / VENTRAL HERNIA REPAIR  08/13/2016   VHR w/mesh   LOWER EXTREMITY ANGIOGRAPHY Left 09/29/2020   Procedure: LOWER EXTREMITY ANGIOGRAPHY;  Surgeon: Algernon Huxley, MD;  Location: [facility];  Service: Cardiovascular;  Laterality: Left;   LOWER EXTREMITY ANGIOGRAPHY Right 10/06/2020   Procedure: LOWER EXTREMITY ANGIOGRAPHY;  Surgeon: Algernon Huxley, MD;  Location: [facility];  Service: Cardiovascular;  Laterality: Right;   LUMBAR DISC SURGERY     "herniated disc"   NASAL FRACTURE SURGERY  1970s X 2   RIGHT/LEFT HEART CATH AND CORONARY ANGIOGRAPHY N/A 12/27/2016   Procedure: RIGHT/LEFT HEART CATH AND CORONARY ANGIOGRAPHY;  Surgeon: Leonie Man, MD;  Location: [facility] LAB: Mild-moderate pulmonary hypertension.   Hyperdynamic ventricle noted.  EF 55-65% -hyperdynamic (unable to measure LVOT gradient).  Mildly elevated very tortuous but angiographically normal coronary arteries, suggesting hypertensive heart disease   SHOULDER ARTHROSCOPY WITH ROTATOR CUFF REPAIR Right 1990s?   TONSILLECTOMY  1951   TRANSTHORACIC ECHOCARDIOGRAM  12/2016    EF 60-65% with dynamic outflow tract obstruction at rest. Peak gradient 52 mmHg consistent with HOCM.  GRII DD area. Aortic sclerosis but no stenosis. Systolic anterior motion of mitral valve chordae. Mild mitral stenosis. Moderate LA dilation and mild RA dilation. Peak PA pressures 35 mmHg.   VENTRAL HERNIA REPAIR N/A 08/13/2016   Procedure: LAPAROSCOPIC VENTRAL HERNIA REPAIR WITH MESH;  Surgeon: Judeth Horn, MD;  Location: [facility];  Service: General;  Laterality: N/A;    There were no vitals filed for this visit.   Subjective Assessment - 07/04/21 1017     Subjective Pt returns for first treatment. Reports being exposed to [place] so couldn't come in. Also had ED visit on February 9th from fall out of bed. No pain currently, denies falls since eval besides on the 9th falling out of bed.    Currently in Pain? No/denies               There.ex:   Began session assessing MMT for understanding LLE weakness compared to RLE.   R/L   Hip flex: 4/3+  Hip abd: 3+/3+  Hip ER:4+/4  Hip IR: 4/3  Knee ext: 4/4  Knee flex: 3/3  Ankle DF: 4+/4+    Pt performing HEP as listed below. Pt educated on frequency, reps, and sets and home modification set up for pt safety.   Access Code: [email or web address] URL: [email or web address] Date: 07/04/2021 Prepared by: Larna Daughters  Exercises Standing March with Counter Support - 1 x daily - 4 x weekly - 2 sets - 10 reps Standing Hip Abduction with Unilateral Counter Support - 1 x daily - 4 x weekly - 2 sets - 10 reps Supine Bridge with Gluteal Set and Spinal Articulation - 1 x daily - 4 x weekly - 2 sets - 10  reps Standing Knee Flexion with Unilateral Counter Support - 1 x daily - 4 x weekly - 2 sets - 10 reps    Please read clinical impression for understanding of glut med weakness with HEP and compensations pt requires to perform.  SLS at stair rail: 1x30 sec/ BUE support. 2x30 sec SUE support. Does require mod multimodal cues for neutral core alignment as pt rotates to compensate for glut med weakness.  Heel raises with BUE support:  2x12  Toe raises with BUE support: 2x12. Cuing to prevent hip hinge to raise toes off  floor  STS: x8 with UE's on arm rests. Cuing for eccentric control. Requires UE support throughout   PT Education - 07/04/21 1117     Education Details HEP. Form/technique with exercises    Person(s) Educated Patient    Methods Explanation;Demonstration;Tactile cues;Verbal cues;Handout    Comprehension Verbalized understanding;Verbal cues required;Tactile cues required;Need further instruction              PT Short Term Goals - 06/15/21 0837       PT SHORT TERM GOAL #1   Title Pt to demonstrate correct HEP performance and report good compliancat at home.    Time 4    Period Weeks    Status New    Target Date 07/13/21      PT SHORT TERM GOAL #2   Title Pt to show improved bilat hip ER P/RM to >60 degrees bilat to facilitate improved motor control in gait and ADL performance.    Baseline 06/14/21: Rt 50, Lt: 45    Time 4    Period Weeks    Status New    Target Date 07/13/21               PT Long Term Goals - 06/15/21 0837       PT LONG TERM GOAL #1   Title Pt to demonstrate FOTO score improvement by >15 to imply improve self-perception of facility in basic moblity for ADL/IADL.    Baseline 06/14/21: 53    Time 8    Period Weeks    Status New    Target Date 08/10/21      PT LONG TERM GOAL #2   Title Pt to demonstrate improved movement tolerance AEB 6MWT performance >1564ft without exacerbation of pain, and ROM testing tolerance without pain  exacerbation.    Baseline 1/25: pain increase with 3 min AMB, pain with ROM testing    Time 8    Period Weeks    Status New    Target Date 08/10/21      PT LONG TERM GOAL #3   Title Pt to demonstrate improved motor performance AEB 5xSTS<12sec hands free and MMT 5/5 in hips and knees.    Baseline 1/25: 5xSTS: 17sec, MMT pending    Time 8    Period Weeks    Status New    Target Date 08/10/21                   Plan - 07/04/21 1117     Clinical Impression Statement Focus of session on development of HEP. Pt appears to present with proximal weakness > distal weakness as evidenced via MMT and with standing exercises. Pt presents with glut max weakness as inability to clear gluts with bridge, trendelenburg with SLS on RLE , and reverse trendeleburg on LLE with SLS and decreased stance time with poor eccentric control throughout L>R. Pt educated and provided on HEP to assist in strengthening LE's to assist in standing and walking ADL's. Pt will continue to benefit from skilled PT services to progress strength, gait, and balance as tolerated to reduce risk of falls and injury.    Personal Factors and Comorbidities Time since onset of injury/illness/exacerbation    Examination-Activity Limitations Locomotion Level;Stand;Transfers    Examination-Participation Restrictions Yard Work    Stability/Clinical Decision Making Evolving/Moderate complexity    Rehab Potential Good    PT Frequency 2x / week    PT Duration 8 weeks    PT Treatment/Interventions Moist Heat;Electrical Stimulation;Gait training;Stair training;Therapeutic  activities;Functional mobility training;Therapeutic exercise;Neuromuscular re-education;Balance training;Patient/family education;Manual techniques;Passive range of motion;Dry needling    PT Next Visit Plan Reassess HEP    PT Home Exercise Plan Access Code: [email or web address]    Consulted and Agree with Plan of Care Patient             Patient will benefit from skilled  therapeutic intervention in order to improve the following deficits and impairments:  Abnormal gait, Decreased balance, Cardiopulmonary status limiting activity, Decreased activity tolerance, Decreased mobility, Decreased range of motion, Difficulty walking, Decreased strength  Visit Diagnosis: Stiffness of left hip, not elsewhere classified  Muscle weakness (generalized)  Difficulty in walking, not elsewhere classified     Problem List Patient Active Problem List   Diagnosis Date Noted   OSA (obstructive sleep apnea) 01/03/2021   Other hypersomnia 01/03/2021   Atherosclerosis of native arteries of extremity with intermittent claudication ([place]) 09/13/2020   Stroke ([place]) 07/04/2020   COPD (chronic obstructive pulmonary disease) ([place])    ETOH abuse    HLD (hyperlipidemia)    CAD (coronary artery disease)    Tobacco abuse    Chronic diastolic CHF (congestive heart failure) ([place])    CKD (chronic kidney disease), stage IIIb    Encounter for general adult medical examination with abnormal findings 02/09/2020   Abnormal renal function finding 02/09/2020   Vitamin D deficiency 02/09/2020   Flu vaccine need 02/09/2020   Dysuria 02/09/2020   Bilateral leg pain 11/28/2019   Generalized abdominal pain 11/28/2019   Inflammatory polyarthropathy ([place]) 11/28/2019   Generalized weakness 01/26/2019   Alcohol use disorder    Hypertrophic obstructive cardiomyopathy ([place])    Aortic valve stenosis    Abnormal EKG 12/26/2016   Troponin level elevated 12/26/2016   Syncope 12/25/2016   Heart palpitations - PACs 12/24/2016   Shortness of breath at rest 12/24/2016   Ventral hernia 08/13/2016   Agitation 08/11/2015   Substance induced mood disorder ([place]) 08/11/2015   Panic disorder with agoraphobia 07/13/2015   Major depressive disorder, recurrent severe without psychotic features ([place]) 07/12/2015   Tobacco use disorder 07/12/2015   Substance abuse ([place]) 07/12/2015   Knee pain, bilateral  09/17/2014   Generalized anxiety disorder 05/17/2014   Hypertriglyceridemia 05/05/2014   GERD (gastroesophageal reflux disease) 05/05/2014   Essential hypertension 05/05/2014    Salem Caster. Fairly IV, PT, DPT Physical Therapist- [facility]  07/04/2021, 11:27 AM  [facility] [address] [place], [address] Phone: [phone]   Fax:  [phone]  Name: JASHLEY YELLIN MRN: [id number] Date of Birth: Sep 21, 1950

## 2021-07-06 ENCOUNTER — Other Ambulatory Visit: Payer: Self-pay

## 2021-07-06 ENCOUNTER — Ambulatory Visit: Payer: Medicare Other

## 2021-07-06 DIAGNOSIS — R262 Difficulty in walking, not elsewhere classified: Secondary | ICD-10-CM | POA: Diagnosis not present

## 2021-07-06 DIAGNOSIS — M25652 Stiffness of left hip, not elsewhere classified: Secondary | ICD-10-CM

## 2021-07-06 DIAGNOSIS — M6281 Muscle weakness (generalized): Secondary | ICD-10-CM

## 2021-07-06 NOTE — Therapy (Signed)
[facility] [address], [place], [address] Phone: [phone]   Fax:  [phone]  Physical Therapy Treatment  Patient Details  Name: Sarah Barnes MRN: [id number] Date of Birth: 1950/06/07 Referring Provider (PT): Drema Dallas PA-C   Encounter Date: 07/06/2021   PT End of Session - 07/06/21 1032     Visit Number 3    Number of Visits 16    Date for PT Re-Evaluation 08/09/21    Authorization Type [employer] Medicare primary, [place] Medicaid secondary    Authorization Time Period 06/14/21-08/09/21    Progress Note Due on Visit 10    PT Start Time 1018    PT Stop Time 1100    PT Time Calculation (min) 42 min    Equipment Utilized During Treatment Gait belt    Activity Tolerance Patient tolerated treatment well;No increased pain    Behavior During Therapy WFL for tasks assessed/performed             Past Medical History:  Diagnosis Date   Agoraphobia with panic disorder    Anemia    Arthritis    "all over" (08/13/2016)   Chronic bronchitis (HCC)    COPD (chronic obstructive pulmonary disease) ([place])    Cryptogenic stroke ([place])    a. 06/2020 Head CT: low density caudate nucleus concerning for infarct; b. 07/2020 s/p MDT Linq; c. 11/2020 Finding of Afib on Linq.   Depression    Dyspnea    occ   Dysrhythmia    palpitations occ due to anxiety   ETOH abuse    GAD (generalized anxiety disorder)    GERD (gastroesophageal reflux disease)    Headache    "daily til I got glasses; now have headache once in awhile" (08/13/2016)   Hepatitis 1960   "isolated &  hospitalized for 2 weeks; don't know which kind of hepatitis"    Hernia, hiatal    High cholesterol    Hypertension    Hypertrophic obstructive cardiomyopathy (HCC)    Mild aortic stenosis    a. 01/2019 Echo: EF 55-60%. Mild AS; b. 12/2019 Echo: Nl EF. Mild diast dysfxn. Trace AS/AI/TR. Mild MR.   Nonobstructive CAD (coronary artery disease)    a. 12/2016  Cath: LM nl, LAD min irregs, LCX nl, OM1/2 min irregs, RCA nl, RPDA min irregs. EF 55-65%.   PAD (peripheral artery disease) ([place])    a. 09/29/2020 s/p PTA/DBA to L prox/mid SFA and stenting of L SFA; b. 10/06/2020 PTA of R TP trunk and prox peroenal. PTA/DBA of R SFA and prox R Popliteal. R SFA stenting x 2; c. 10/2020 ABI: R 0.92, L 0.93.   PAF (paroxysmal atrial fibrillation) ([place])    a.11/2020 PAF noted on Linq; b. CHA2DS2VASc = 6-->eliquis.   Pneumonia    "once or twice" (08/13/2016)   PONV (postoperative nausea and vomiting)    Sciatica    Tobacco abuse     Past Surgical History:  Procedure Laterality Date   ANAL FISTULECTOMY  1970s X 3   BACK SURGERY     COLONOSCOPY WITH PROPOFOL N/A 04/24/2021   Procedure: COLONOSCOPY WITH PROPOFOL;  Surgeon: [name], MD;  Location: [facility] ENDOSCOPY;  Service: Gastroenterology;  Laterality: N/A;   COLONOSCOPY WITH PROPOFOL N/A 06/15/2021   Procedure: COLONOSCOPY WITH PROPOFOL;  Surgeon: [name], MD;  Location: [facility] ENDOSCOPY;  Service: Gastroenterology;  Laterality: N/A;   FRACTURE SURGERY     HERNIA REPAIR     KNEE  ARTHROSCOPY Right    LAPAROSCOPIC CHOLECYSTECTOMY     LAPAROSCOPIC INCISIONAL / UMBILICAL / VENTRAL HERNIA REPAIR  08/13/2016   VHR w/mesh   LOWER EXTREMITY ANGIOGRAPHY Left 09/29/2020   Procedure: LOWER EXTREMITY ANGIOGRAPHY;  Surgeon: Algernon Huxley, MD;  Location: [facility] CV LAB;  Service: Cardiovascular;  Laterality: Left;   LOWER EXTREMITY ANGIOGRAPHY Right 10/06/2020   Procedure: LOWER EXTREMITY ANGIOGRAPHY;  Surgeon: Algernon Huxley, MD;  Location: [facility];  Service: Cardiovascular;  Laterality: Right;   LUMBAR DISC SURGERY     "herniated disc"   NASAL FRACTURE SURGERY  1970s X 2   RIGHT/LEFT HEART CATH AND CORONARY ANGIOGRAPHY N/A 12/27/2016   Procedure: RIGHT/LEFT HEART CATH AND CORONARY ANGIOGRAPHY;  Surgeon: Leonie Man, MD;  Location: [facility] IN[facility]AS[facility] LAB: Mild-moderate pulmonary hypertension.   Hyperdynamic ventricle noted.  EF 55-65% -hyperdynamic (unable to measure LVOT gradient).  Mildly elevated very tortuous but angiographically normal coronary arteries, suggesting hypertensive heart disease   SHOULDER ARTHROSCOPY WITH ROTATOR CUFF REPAIR Right 1990s?   TONSILLECTOMY  1951   TRANSTHORACIC ECHOCARDIOGRAM  12/2016    EF 60-65% with dynamic outflow tract obstruction at rest. Peak gradient 52 mmHg consistent with HOCM.  GRII DD area. Aortic sclerosis but no stenosis. Systolic anterior motion of mitral valve chordae. Mild mitral stenosis. Moderate LA dilation and mild RA dilation. Peak PA pressures 35 mmHg.   VENTRAL HERNIA REPAIR N/A 08/13/2016   Procedure: LAPAROSCOPIC VENTRAL HERNIA REPAIR WITH MESH;  Surgeon: Judeth Horn, MD;  Location: [facility];  Service: General;  Laterality: N/A;    There were no vitals filed for this visit.   Subjective Assessment - 07/06/21 1022     Subjective Pt reports no pain prior to session. Reports up to 10/10 pain in L hip that woke her up in her sleep saying she was sleeping on her L side since previous session. Pain improved after half hour with positional changes. Compliant with HEP. reports wanting to improve safety getting in/out of her tub shower being able to wash her hair with eyes closed without LOB.    Currently in Pain? No/denies             There.ex:   Nu-Step L1 for gentle LE mobility. 5 min with UE/LE use.    SLS at stair rail: 3x30 sec/ BUE support. 3x30 sec Does require mod multimodal cues for neutral core alignment as pt rotates to compensate for glut med weakness.  Foot taps on 6 step with BUE support: 2x20/LE.   Standing hip abduction with BUE support: 2x10/LE. Limited stance time on BLE's with circumduction motion on L >R. Unable to perform L hip abduction without significant Trendelenburg on R side. With max TC's and blocking of R hip pt unable to abduct L hip beyond small side step.    Standing with EC with no UE support:  3x30 sec holds. CGA provided due to posterolateral (to left) sway. Requires significant ankle strategy to correct but able to perform without PT support.    Heel raises with BUE support:  2x12. Requires use of momentum, limited foot clearance.    Seated ankle PF with RTB: 2x15/LE. PT demo initially. Good carryover. Added to HEP with removal of heel raises due to limited foot clearance and ROM with standing exercise.   Bridges: 2x10, cuing for foot proximity to glutes for improved glut max activation versus hamstrings.       Pt educated on varying sleeping postures to prevent exacerbation  of L hip pain with lying on L side with use of pillows. Education on possible benefits of shower seat to perform safe showering ADL's due to reports of poor balance standing in shower to bath.     PT Education - 07/06/21 1024     Education Details form/technique with exercise. Sleeping postures to off load L hip, updated HEP.    Person(s) Educated Patient    Methods Explanation;Demonstration;Tactile cues;Verbal cues;Handout    Comprehension Verbalized understanding;Returned demonstration;Verbal cues required;Tactile cues required              PT Short Term Goals - 06/15/21 0837       PT SHORT TERM GOAL #1   Title Pt to demonstrate correct HEP performance and report good compliancat at home.    Time 4    Period Weeks    Status New    Target Date 07/13/21      PT SHORT TERM GOAL #2   Title Pt to show improved bilat hip ER P/RM to >60 degrees bilat to facilitate improved motor control in gait and ADL performance.    Baseline 06/14/21: Rt 50, Lt: 45    Time 4    Period Weeks    Status New    Target Date 07/13/21               PT Long Term Goals - 06/15/21 0837       PT LONG TERM GOAL #1   Title Pt to demonstrate FOTO score improvement by >15 to imply improve self-perception of facility in basic moblity for ADL/IADL.    Baseline 06/14/21: 53    Time 8    Period Weeks    Status  New    Target Date 08/10/21      PT LONG TERM GOAL #2   Title Pt to demonstrate improved movement tolerance AEB 6MWT performance >1568ft without exacerbation of pain, and ROM testing tolerance without pain exacerbation.    Baseline 1/25: pain increase with 3 min AMB, pain with ROM testing    Time 8    Period Weeks    Status New    Target Date 08/10/21      PT LONG TERM GOAL #3   Title Pt to demonstrate improved motor performance AEB 5xSTS<12sec hands free and MMT 5/5 in hips and knees.    Baseline 1/25: 5xSTS: 17sec, MMT pending    Time 8    Period Weeks    Status New    Target Date 08/10/21                   Plan - 07/06/21 1114     Clinical Impression Statement Pt maintaining difficulty with form/technique due to proximal hip weakness. Required mod to max TC's for form/technique with standing exercises with HEP adjustment with heel raises to seated ankle PF with resisted band due to heavy compensations required in standing. Pt educated on use of pillows and varying sleeping postures to prevent exacerbation of L hip pain with L side lying. Also educated on temporary benefits of seated shower seat due to reports of LOB and diffculty standing, bathing ADL's. Updated HEP provided. Pt continues to require skilled PT services due to muscular weakness and balance impairments placing pt at risk for falls.    Personal Factors and Comorbidities Time since onset of injury/illness/exacerbation    Examination-Activity Limitations Locomotion Level;Stand;Transfers    Examination-Participation Restrictions Yard Work    Stability/Clinical Decision Making Evolving/Moderate complexity  Rehab Potential Good    PT Frequency 2x / week    PT Duration 8 weeks    PT Treatment/Interventions Moist Heat;Electrical Stimulation;Gait training;Stair training;Therapeutic activities;Functional mobility training;Therapeutic exercise;Neuromuscular re-education;Balance training;Patient/family education;Manual  techniques;Passive range of motion;Dry needling    PT Next Visit Plan Hip stability, balance on firm surfaces with narrowed BOS and/or eyes closed    PT Home Exercise Plan Access Code: [email or web address]    Consulted and Agree with Plan of Care Patient             Patient will benefit from skilled therapeutic intervention in order to improve the following deficits and impairments:  Abnormal gait, Decreased balance, Cardiopulmonary status limiting activity, Decreased activity tolerance, Decreased mobility, Decreased range of motion, Difficulty walking, Decreased strength  Visit Diagnosis: Stiffness of left hip, not elsewhere classified  Muscle weakness (generalized)  Difficulty in walking, not elsewhere classified     Problem List Patient Active Problem List   Diagnosis Date Noted   OSA (obstructive sleep apnea) 01/03/2021   Other hypersomnia 01/03/2021   Atherosclerosis of native arteries of extremity with intermittent claudication ([place]) 09/13/2020   Stroke ([place]) 07/04/2020   COPD (chronic obstructive pulmonary disease) ([place])    ETOH abuse    HLD (hyperlipidemia)    CAD (coronary artery disease)    Tobacco abuse    Chronic diastolic CHF (congestive heart failure) ([place])    CKD (chronic kidney disease), stage IIIb    Encounter for general adult medical examination with abnormal findings 02/09/2020   Abnormal renal function finding 02/09/2020   Vitamin D deficiency 02/09/2020   Flu vaccine need 02/09/2020   Dysuria 02/09/2020   Bilateral leg pain 11/28/2019   Generalized abdominal pain 11/28/2019   Inflammatory polyarthropathy ([place]) 11/28/2019   Generalized weakness 01/26/2019   Alcohol use disorder    Hypertrophic obstructive cardiomyopathy ([place])    Aortic valve stenosis    Abnormal EKG 12/26/2016   Troponin level elevated 12/26/2016   Syncope 12/25/2016   Heart palpitations - PACs 12/24/2016   Shortness of breath at rest 12/24/2016   Ventral hernia 08/13/2016   Agitation  08/11/2015   Substance induced mood disorder ([place]) 08/11/2015   Panic disorder with agoraphobia 07/13/2015   Major depressive disorder, recurrent severe without psychotic features ([place]) 07/12/2015   Tobacco use disorder 07/12/2015   Substance abuse ([place]) 07/12/2015   Knee pain, bilateral 09/17/2014   Generalized anxiety disorder 05/17/2014   Hypertriglyceridemia 05/05/2014   GERD (gastroesophageal reflux disease) 05/05/2014   Essential hypertension 05/05/2014    Salem Caster. Fairly IV, PT, DPT Physical Therapist- [facility]  07/06/2021, 11:21 AM  [facility] [address], [place], [address] Phone: [phone]   Fax:  [phone]  Name: JACOYA BAUMAN MRN: [id number] Date of Birth: 01-02-1951

## 2021-07-08 ENCOUNTER — Other Ambulatory Visit: Payer: Self-pay | Admitting: Physician Assistant

## 2021-07-08 ENCOUNTER — Other Ambulatory Visit: Payer: Self-pay | Admitting: Internal Medicine

## 2021-07-08 DIAGNOSIS — M064 Inflammatory polyarthropathy: Secondary | ICD-10-CM

## 2021-07-11 ENCOUNTER — Ambulatory Visit: Payer: Medicare Other

## 2021-07-11 ENCOUNTER — Other Ambulatory Visit: Payer: Self-pay

## 2021-07-11 DIAGNOSIS — M6281 Muscle weakness (generalized): Secondary | ICD-10-CM | POA: Diagnosis not present

## 2021-07-11 DIAGNOSIS — R262 Difficulty in walking, not elsewhere classified: Secondary | ICD-10-CM

## 2021-07-11 DIAGNOSIS — M25652 Stiffness of left hip, not elsewhere classified: Secondary | ICD-10-CM

## 2021-07-11 NOTE — Therapy (Signed)
[facility] [address] [place], [address] Phone: [phone]   Fax:  [phone]  Physical Therapy Treatment  Patient Details  Name: Sarah Barnes MRN: [id number] Date of Birth: [birth year] Referring Provider (PT): Drema Dallas PA-C   Encounter Date: 07/11/2021   PT End of Session - 07/11/21 1000     Visit Number 4    Number of Visits 16    Date for PT Re-Evaluation 08/09/21    Authorization Type [employer] Medicare primary, [place] Medicaid secondary    Authorization Time Period 06/14/21-08/09/21    Progress Note Due on Visit 10    PT Start Time 0955    PT Stop Time 1035    PT Time Calculation (min) 40 min    Equipment Utilized During Treatment Gait belt    Activity Tolerance Patient tolerated treatment well;No increased pain    Behavior During Therapy WFL for tasks assessed/performed             Past Medical History:  Diagnosis Date   Agoraphobia with panic disorder    Anemia    Arthritis    "all over" (08/13/2016)   Chronic bronchitis (HCC)    COPD (chronic obstructive pulmonary disease) ([place])    Cryptogenic stroke ([place])    a. 06/2020 Head CT: low density caudate nucleus concerning for infarct; b. 07/2020 s/p MDT Linq; c. 11/2020 Finding of Afib on Linq.   Depression    Dyspnea    occ   Dysrhythmia    palpitations occ due to anxiety   ETOH abuse    GAD (generalized anxiety disorder)    GERD (gastroesophageal reflux disease)    Headache    "daily til I got glasses; now have headache once in awhile" (08/13/2016)   Hepatitis 1960   "isolated &  hospitalized for 2 weeks; don't know which kind of hepatitis"    Hernia, hiatal    High cholesterol    Hypertension    Hypertrophic obstructive cardiomyopathy (HCC)    Mild aortic stenosis    a. 01/2019 Echo: EF 55-60%. Mild AS; b. 12/2019 Echo: Nl EF. Mild diast dysfxn. Trace AS/AI/TR. Mild MR.   Nonobstructive CAD (coronary artery disease)    a. 12/2016  Cath: LM nl, LAD min irregs, LCX nl, OM1/2 min irregs, RCA nl, RPDA min irregs. EF 55-65%.   PAD (peripheral artery disease) ([place])    a. 09/29/2020 s/p PTA/DBA to L prox/mid SFA and stenting of L SFA; b. 10/06/2020 PTA of R TP trunk and prox peroenal. PTA/DBA of R SFA and prox R Popliteal. R SFA stenting x 2; c. 10/2020 ABI: R 0.92, L 0.93.   PAF (paroxysmal atrial fibrillation) ([place])    a.11/2020 PAF noted on Linq; b. CHA2DS2VASc = 6-->eliquis.   Pneumonia    "once or twice" (08/13/2016)   PONV (postoperative nausea and vomiting)    Sciatica    Tobacco abuse     Past Surgical History:  Procedure Laterality Date   ANAL FISTULECTOMY  1970s X 3   BACK SURGERY     COLONOSCOPY WITH PROPOFOL N/A 04/24/2021   Procedure: COLONOSCOPY WITH PROPOFOL;  Surgeon: [name], MD;  Location[facility]COPY;  Service: Gastroenterology;  Laterality: N/A;   COLONOSCOPY WITH PROPOFOL N/A 06/15/2021   Procedure: COLONOSCOPY WITH PROPOFOL;  Surgeon: [name], MD;  Location: [facility] ENDOSCOPY;  Service: Gastroenterology;  Laterality: N/A;   FRACTURE SURGERY     HERNIA REPAIR     KNEE  ARTHROSCOPY Right    LAPAROSCOPIC CHOLECYSTECTOMY     LAPAROSCOPIC INCISIONAL / UMBILICAL / VENTRAL HERNIA REPAIR  08/13/2016   VHR w/mesh   LOWER EXTREMITY ANGIOGRAPHY Left 09/29/2020   Procedure: LOWER EXTREMITY ANGIOGRAPHY;  Surgeon: Algernon Huxley, MD;  Location: [facility] CV LAB;  Service: Cardiovascular;  Laterality: Left;   LOWER EXTREMITY ANGIOGRAPHY Right 10/06/2020   Procedure: LOWER EXTREMITY ANGIOGRAPHY;  Surgeon: Algernon Huxley, MD;  Location: [facility];  Service: Cardiovascular;  Laterality: Right;   LUMBAR DISC SURGERY     "herniated disc"   NASAL FRACTURE SURGERY  1970s X 2   RIGHT/LEFT HEART CATH AND CORONARY ANGIOGRAPHY N/A 12/27/2016   Procedure: RIGHT/LEFT HEART CATH AND CORONARY ANGIOGRAPHY;  Surgeon: Leonie Man, MD;  Location: [facility] INVASIVE CV LAB: Mild-moderate pulmonary hypertension.   Hyperdynamic ventricle noted.  EF 55-65% -hyperdynamic (unable to measure LVOT gradient).  Mildly elevated very tortuous but angiographically normal coronary arteries, suggesting hypertensive heart disease   SHOULDER ARTHROSCOPY WITH ROTATOR CUFF REPAIR Right 1990s?   TONSILLECTOMY  1951   TRANSTHORACIC ECHOCARDIOGRAM  12/2016    EF 60-65% with dynamic outflow tract obstruction at rest. Peak gradient 52 mmHg consistent with HOCM.  GRII DD area. Aortic sclerosis but no stenosis. Systolic anterior motion of mitral valve chordae. Mild mitral stenosis. Moderate LA dilation and mild RA dilation. Peak PA pressures 35 mmHg.   VENTRAL HERNIA REPAIR N/A 08/13/2016   Procedure: LAPAROSCOPIC VENTRAL HERNIA REPAIR WITH MESH;  Surgeon: Judeth Horn, MD;  Location: [facility];  Service: General;  Laterality: N/A;    There were no vitals filed for this visit.   Subjective Assessment - 07/11/21 0957     Subjective Pt doing well today, reports some difficulty with performing hip ABDCT in HEP at home. No pain today, no medication updates since prior visit.    Pertinent History [name] is a 13yoF who presents to OPPT for evaluation of acute, insidious onset BLE weakness and inability to walk that began week of Christmas 2022 and resulted in 1-2 days in ability to walk, attempts resulted in a fall to floor requiring assist from family to help off floor. Pt referred by PCP, but medical workup has minimal by time of PT evaluation here. Pt denies any paresthesia, but has since has partial improvement in strength, now able to AMB all needed to perform ADL, IADL with intermittent concern of LLE buckling when up for prolonged periods. Pt also reports concordant pain in bilat thighs each day which seems to abate after morning time. Pt denies any prior history of similar episodes. Per pt- hip imaging is pending. At baseline pt is fully independent in ADL, IADL, community ambulation as needed, no assistive device use, no falls  history, pt is a retired Biomedical scientist.    Currently in Pain? No/denies            INTERVENTION THIS DATE: -AA/ROM on NuSTEP: Level 1 x 5 minutes, RPM ad lib (50s), seat 8, arms 11  -STS from chair + airex 1x10 hands free  -standing heel raises 1x15, BUE support  -standing hip ABDCT 1x10x1secH each side   -STS from chair + airex 1x10 hands free  -standing heel raises 1x15, BUE support  -standing hip ABDCT 1x10x1secH each side   -Seated (chair + airex) red TB clam 1x15, green ball between feet -Seated (chair + airex) yellow TB IR 1x15, green ball between knees (left hip weakness seen)  -seated (chair + airex) marching wide  1x15 bilat   -Narrow stance on airex pad 1x60sec -step taps 1x20 alternating sides (intermittent RUE support >50% of time due to postural fatigue and imbalance)  -lateral step overs 1x30 alteranting sides, ad lib UE support  -Narrow stance on airex pad 1x60sec -step taps 1x20 alternating sides (intermittent RUE support >50% of time due to postural fatigue and imbalance)  -lateral step overs 1x30 alteranting sides, ad lib UE support      PT Education - 07/11/21 0959     Education Details Reviewed recommendationsfor sleep posturing for reduced left hip pain upon waking    Person(s) Educated Patient    Methods Explanation;Demonstration    Comprehension Verbalized understanding;Need further instruction              PT Short Term Goals - 06/15/21 0837       PT SHORT TERM GOAL #1   Title Pt to demonstrate correct HEP performance and report good compliancat at home.    Time 4    Period Weeks    Status New    Target Date 07/13/21      PT SHORT TERM GOAL #2   Title Pt to show improved bilat hip ER P/RM to >60 degrees bilat to facilitate improved motor control in gait and ADL performance.    Baseline 06/14/21: Rt 50, Lt: 45    Time 4    Period Weeks    Status New    Target Date 07/13/21               PT Long Term Goals - 06/15/21 0837       PT  LONG TERM GOAL #1   Title Pt to demonstrate FOTO score improvement by >15 to imply improve self-perception of facility in basic moblity for ADL/IADL.    Baseline 06/14/21: 53    Time 8    Period Weeks    Status New    Target Date 08/10/21      PT LONG TERM GOAL #2   Title Pt to demonstrate improved movement tolerance AEB 6MWT performance >1566ft without exacerbation of pain, and ROM testing tolerance without pain exacerbation.    Baseline 1/25: pain increase with 3 min AMB, pain with ROM testing    Time 8    Period Weeks    Status New    Target Date 08/10/21      PT LONG TERM GOAL #3   Title Pt to demonstrate improved motor performance AEB 5xSTS<12sec hands free and MMT 5/5 in hips and knees.    Baseline 1/25: 5xSTS: 17sec, MMT pending    Time 8    Period Weeks    Status New    Target Date 08/10/21                   Plan - 07/11/21 1000     Clinical Impression Statement Continued with generalized BLE strengthening and motor control training in standing/walking. Pt able to complete without any pain exacerbation. No updates to HEP at this time.    Personal Factors and Comorbidities Time since onset of injury/illness/exacerbation    Examination-Activity Limitations Locomotion Level;Stand;Transfers    Examination-Participation Restrictions Yard Work    Stability/Clinical Decision Making Evolving/Moderate complexity    Clinical Decision Making Moderate    Rehab Potential Good    PT Frequency 2x / week    PT Duration 8 weeks    PT Treatment/Interventions Moist Heat;Electrical Stimulation;Gait training;Stair training;Therapeutic activities;Functional mobility training;Therapeutic exercise;Neuromuscular re-education;Balance training;Patient/family education;Manual techniques;Passive range of  motion;Dry needling    PT Next Visit Plan Continue with gross strengthening of BLE and standing motor control training    PT Home Exercise Plan Access Code: [email or web address]    Consulted and Agree  with Plan of Care Patient             Patient will benefit from skilled therapeutic intervention in order to improve the following deficits and impairments:  Abnormal gait, Decreased balance, Cardiopulmonary status limiting activity, Decreased activity tolerance, Decreased mobility, Decreased range of motion, Difficulty walking, Decreased strength  Visit Diagnosis: Stiffness of left hip, not elsewhere classified  Muscle weakness (generalized)  Difficulty in walking, not elsewhere classified     Problem List Patient Active Problem List   Diagnosis Date Noted   OSA (obstructive sleep apnea) 01/03/2021   Other hypersomnia 01/03/2021   Atherosclerosis of native arteries of extremity with intermittent claudication ([place]) 09/13/2020   Stroke ([place]) 07/04/2020   COPD (chronic obstructive pulmonary disease) ([place])    ETOH abuse    HLD (hyperlipidemia)    CAD (coronary artery disease)    Tobacco abuse    Chronic diastolic CHF (congestive heart failure) ([place])    CKD (chronic kidney disease), stage IIIb    Encounter for general adult medical examination with abnormal findings 02/09/2020   Abnormal renal function finding 02/09/2020   Vitamin D deficiency 02/09/2020   Flu vaccine need 02/09/2020   Dysuria 02/09/2020   Bilateral leg pain 11/28/2019   Generalized abdominal pain 11/28/2019   Inflammatory polyarthropathy ([place]) 11/28/2019   Generalized weakness 01/26/2019   Alcohol use disorder    Hypertrophic obstructive cardiomyopathy ([place])    Aortic valve stenosis    Abnormal EKG 12/26/2016   Troponin level elevated 12/26/2016   Syncope 12/25/2016   Heart palpitations - PACs 12/24/2016   Shortness of breath at rest 12/24/2016   Ventral hernia 08/13/2016   Agitation 08/11/2015   Substance induced mood disorder ([place]) 08/11/2015   Panic disorder with agoraphobia 07/13/2015   Major depressive disorder, recurrent severe without psychotic features ([place]) 07/12/2015   Tobacco use  disorder 07/12/2015   Substance abuse ([place]) 07/12/2015   Knee pain, bilateral 09/17/2014   Generalized anxiety disorder 05/17/2014   Hypertriglyceridemia 05/05/2014   GERD (gastroesophageal reflux disease) 05/05/2014   Essential hypertension 05/05/2014   10:19 AM, 07/11/21 Etta Grandchild, PT, DPT Physical Therapist - [facility] [phone] (Office)   [place] C, PT 07/11/2021, 10:09 AM  [facility] [address] [place], [address] Phone: [phone]   Fax:  [phone]  Name: JO BOOZE MRN: [id number] Date of Birth: [birth year]

## 2021-07-12 ENCOUNTER — Other Ambulatory Visit: Payer: Self-pay | Admitting: Physician Assistant

## 2021-07-13 ENCOUNTER — Encounter: Payer: Self-pay | Admitting: Physician Assistant

## 2021-07-13 ENCOUNTER — Ambulatory Visit: Payer: Medicare Other | Admitting: Physical Therapy

## 2021-07-13 ENCOUNTER — Ambulatory Visit (INDEPENDENT_AMBULATORY_CARE_PROVIDER_SITE_OTHER): Payer: Medicare Other | Admitting: Nurse Practitioner

## 2021-07-13 ENCOUNTER — Other Ambulatory Visit: Payer: Self-pay

## 2021-07-13 VITALS — BP 120/70 | HR 59 | Temp 98.4°F | Resp 16 | Ht 63.0 in | Wt 166.2 lb

## 2021-07-13 DIAGNOSIS — R531 Weakness: Secondary | ICD-10-CM | POA: Diagnosis not present

## 2021-07-13 DIAGNOSIS — M810 Age-related osteoporosis without current pathological fracture: Secondary | ICD-10-CM

## 2021-07-13 DIAGNOSIS — H5340 Unspecified visual field defects: Secondary | ICD-10-CM

## 2021-07-13 DIAGNOSIS — G4733 Obstructive sleep apnea (adult) (pediatric): Secondary | ICD-10-CM

## 2021-07-13 DIAGNOSIS — W19XXXD Unspecified fall, subsequent encounter: Secondary | ICD-10-CM

## 2021-07-13 DIAGNOSIS — I10 Essential (primary) hypertension: Secondary | ICD-10-CM | POA: Diagnosis not present

## 2021-07-13 NOTE — Progress Notes (Signed)
[facility] [place], [place] [address]  Internal MEDICINE  Office Visit Note  Patient Name: Sarah Barnes  [id number]  [id number]  Date of Service: 07/13/2021  Chief Complaint  Patient presents with   Follow-up    Pt seems wobbly and off balance, started this morning, a few weeks ago was in the ED for nose bleeds    Depression   Gastroesophageal Reflux   Hyperlipidemia   Hypertension   Anxiety   Anemia   COPD    HPI Sarah Barnes presents for follow-up visit after having an ER visit for a fall couple of weeks ago.  She fell out of the bed.  She denies hitting her head or having any injury and denies having any injury to her nose.  After the fall she got up after little bit got back into bed and fell asleep, she woke up a little bit later and her sugar was covered in blood from her nose.  She has not had any nosebleeds since this incident.  She reports feeling wobbly and off balance at times more so this morning.  She does have a cane at home to use if she needs it but she does not use it.  Her blood pressure is normal and she does not have diabetes.  She denies any dizziness but reports that her legs feel weak.  She does go to physical therapy regularly and they are working with her to strengthen her legs.    Current Medication: Outpatient Encounter Medications as of 07/13/2021  Medication Sig Note   apixaban (ELIQUIS) 5 MG TABS tablet Take 1 tablet (5 mg total) by mouth 2 (two) times daily. 06/14/2021: Stopped until after colonoscopy    atorvastatin (LIPITOR) 40 MG tablet TAKE 1 TABLET BY MOUTH  DAILY    azithromycin (ZITHROMAX) 250 MG tablet Take 1 tablet (250 mg total) by mouth daily. Take 1 tablet by mouth daily for 10 days    buPROPion (WELLBUTRIN XL) 150 MG 24 hr tablet TAKE 1 TABLET BY MOUTH  DAILY    busPIRone (BUSPAR) 15 MG tablet TAKE 1 TABLET BY MOUTH  TWICE DAILY    carvedilol (COREG) 12.5 MG tablet Take by mouth 2 (two) times daily.    celecoxib  (CELEBREX) 100 MG capsule TAKE 1 CAPSULE BY MOUTH  DAILY    cilostazol (PLETAL) 50 MG tablet TAKE 1 TABLET BY MOUTH  TWICE DAILY    escitalopram (LEXAPRO) 10 MG tablet TAKE 1 TABLET BY MOUTH  DAILY    fenofibrate micronized (LOFIBRA) 134 MG capsule Take 1 capsule (134 mg total) by mouth daily before breakfast. NEEDS APPOINTMENT FOR FUTURE REFILLS    folic acid (FOLVITE) 1 MG tablet Take 1 tablet (1 mg total) by mouth daily.    furosemide (LASIX) 40 MG tablet TAKE 1 TABLET BY MOUTH  DAILY    gabapentin (NEURONTIN) 100 MG capsule TAKE 2 CAPSULES BY MOUTH IN THE MORNING, 1 CAPSULE IN  THE AFTERNOON AND 2  CAPSULES AT NIGHT    hydrOXYzine (VISTARIL) 25 MG capsule TAKE 1 CAPSULE BY MOUTH  EVERY 8 HOURS AS NEEDED    ibandronate (BONIVA) 150 MG tablet Take one  tab po once a month  in the morning with a full glass of water, on an empty stomach, and do not take anything else by mouth or lie down for the next 30 min.    meclizine (ANTIVERT) 25 MG tablet Take 1 tablet (25 mg total) by mouth 3 (three)  times daily as needed for dizziness.    Multiple Vitamin (MULTIVITAMIN WITH MINERALS) TABS tablet Take 1 tablet by mouth daily.    pantoprazole (PROTONIX) 40 MG tablet TAKE 1 TABLET BY MOUTH  DAILY    Potassium 99 MG TABS Take by mouth.    predniSONE (DELTASONE) 10 MG tablet Take 1 tablet (10 mg total) by mouth daily with breakfast. take one tab 3 X day for 3 days, then take one tab 2 X a day for 3 days and then take one tab a day for 3 days    tiZANidine (ZANAFLEX) 2 MG tablet TAKE 1 TABLET BY MOUTH  EVERY 8 HOURS AS NEEDED FOR MUSCLE SPASM(S)    traMADol (ULTRAM) 50 MG tablet Take 1 tablet (50 mg total) by mouth every 12 (twelve) hours as needed.    traZODone (DESYREL) 100 MG tablet TAKE 1 TABLET BY MOUTH  DAILY AT BEDTIME    valsartan (DIOVAN) 160 MG tablet TAKE 1 TABLET BY MOUTH  DAILY    No facility-administered encounter medications on file as of 07/13/2021.    Surgical History: Past Surgical History:   Procedure Laterality Date   ANAL FISTULECTOMY  1970s X 3   BACK SURGERY     COLONOSCOPY WITH PROPOFOL N/A 04/24/2021   Procedure: COLONOSCOPY WITH PROPOFOL;  Surgeon: [name], MD;  [facility] ENDOSCOPY;  Service: Gastroenterology;  Laterality: N/A;   COLONOSCOPY WITH PROPOFOL N/A 06/15/2021   Procedure: COLONOSCOPY WITH PROPOFOL;  Surgeon: [name], MD;  Location: [facility] ENDOSCOPY;  Service: Gastroenterology;  Laterality: N/A;   FRACTURE SURGERY     HERNIA REPAIR     KNEE ARTHROSCOPY Right    LAPAROSCOPIC CHOLECYSTECTOMY     LAPAROSCOPIC INCISIONAL / UMBILICAL / VENTRAL HERNIA REPAIR  08/13/2016   Tok w/mesh   LOWER EXTREMITY ANGIOGRAPHY Left 09/29/2020   Procedure: LOWER EXTREMITY ANGIOGRAPHY;  Surgeon: Algernon Huxley, MD;  Location: [place] CV LAB;  Service: Cardiovascular;  Laterality: Left;   LOWER EXTREMITY ANGIOGRAPHY Right 10/06/2020   Procedure: LOWER EXTREMITY ANGIOGRAPHY;  Surgeon: Algernon Huxley, MD;  Location: [place] CV LAB;  Service: Cardiovascular;  Laterality: Right;   LUMBAR DISC SURGERY     "herniated disc"   NASAL FRACTURE SURGERY  1970s X 2   RIGHT/LEFT HEART CATH AND CORONARY ANGIOGRAPHY N/A 12/27/2016   Procedure: RIGHT/LEFT HEART CATH AND CORONARY ANGIOGRAPHY;  Surgeon: Leonie Man, MD;  Location: [place] INVASIVE CV LAB: Mild-moderate pulmonary hypertension.  Hyperdynamic ventricle noted.  EF 55-65% -hyperdynamic (unable to measure LVOT gradient).  Mildly elevated very tortuous but angiographically normal coronary arteries, suggesting hypertensive heart disease   SHOULDER ARTHROSCOPY WITH ROTATOR CUFF REPAIR Right 1990s?   TONSILLECTOMY  1951   TRANSTHORACIC ECHOCARDIOGRAM  12/2016    EF 60-65% with dynamic outflow tract obstruction at rest. Peak gradient 52 mmHg consistent with HOCM.  GRII DD area. Aortic sclerosis but no stenosis. Systolic anterior motion of mitral valve chordae. Mild mitral stenosis. Moderate LA dilation and mild RA dilation. Peak  PA pressures 35 mmHg.   VENTRAL HERNIA REPAIR N/A 08/13/2016   Procedure: LAPAROSCOPIC VENTRAL HERNIA REPAIR WITH MESH;  Surgeon: Judeth Horn, MD;  Location: [place];  Service: General;  Laterality: N/A;    Medical History: Past Medical History:  Diagnosis Date   Agoraphobia with panic disorder    Anemia    Arthritis    "all over" (08/13/2016)   Chronic bronchitis ([place])    COPD (chronic obstructive pulmonary disease) ([place])  Cryptogenic stroke ([place])    a. 06/2020 Head CT: low density caudate nucleus concerning for infarct; b. 07/2020 s/p MDT Linq; c. 11/2020 Finding of Afib on Linq.   Depression    Dyspnea    occ   Dysrhythmia    palpitations occ due to anxiety   ETOH abuse    GAD (generalized anxiety disorder)    GERD (gastroesophageal reflux disease)    Headache    "daily til I got glasses; now have headache once in awhile" (08/13/2016)   Hepatitis 1960   "isolated &  hospitalized for 2 weeks; don't know which kind of hepatitis"    Hernia, hiatal    High cholesterol    Hypertension    Hypertrophic obstructive cardiomyopathy (HCC)    Mild aortic stenosis    a. 01/2019 Echo: EF 55-60%. Mild AS; b. 12/2019 Echo: Nl EF. Mild diast dysfxn. Trace AS/AI/TR. Mild MR.   Nonobstructive CAD (coronary artery disease)    a. 12/2016 Cath: LM nl, LAD min irregs, LCX nl, OM1/2 min irregs, RCA nl, RPDA min irregs. EF 55-65%.   PAD (peripheral artery disease) ([place])    a. 09/29/2020 s/p PTA/DBA to L prox/mid SFA and stenting of L SFA; b. 10/06/2020 PTA of R TP trunk and prox peroenal. PTA/DBA of R SFA and prox R Popliteal. R SFA stenting x 2; c. 10/2020 ABI: R 0.92, L 0.93.   PAF (paroxysmal atrial fibrillation) ([place])    a.11/2020 PAF noted on Linq; b. CHA2DS2VASc = 6-->eliquis.   Pneumonia    "once or twice" (08/13/2016)   PONV (postoperative nausea and vomiting)    Sciatica    Tobacco abuse     Family History: Family History  Problem Relation Age of Onset   Diabetes Mother    Hyperlipidemia  Mother    Hypertension Mother    Heart attack Mother 82   Angina Mother        chronic problem   Hypertension Father    Stroke Father    Cancer Brother     Social History   Socioeconomic History   Marital status: Single    Spouse name: Not on file   Number of children: Not on file   Years of education: Not on file   Highest education level: Not on file  Occupational History   Not on file  Tobacco Use   Smoking status: Every Day    Packs/day: 1.00    Years: 49.00    Pack years: 49.00    Types: Cigarettes   Smokeless tobacco: Never  Vaping Use   Vaping Use: Former  Substance and Sexual Activity   Alcohol use: Not Currently    Alcohol/week: 84.0 standard drinks    Types: 36 Cans of beer, 48 Shots of liquor per week    Comment: occ   Drug use: Not Currently    Frequency: 1.0 times per week    Types: Marijuana, Cocaine    Comment: Cocaine + March 2017   Sexual activity: Not Currently  Other Topics Concern   Not on file  Social History Narrative   Alizeh has generalized anxiety disorder and clearcut Agoraphobia.   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    She currently lives with her sister.   Currently denies substance abuse beyond Tobacco (1-1/2 PPD) & EtOH (beer, vodka) - not ready to discuss quitting.  Social Determinants of Health   Financial Resource Strain: Not on file  Food Insecurity: Not on file  Transportation Needs: Not on file  Physical Activity: Not on file  Stress: Not on file  Social Connections: Not on file  Intimate Partner Violence: Not on file      Review of Systems  Constitutional:  Negative for chills, fatigue and unexpected weight change.  HENT:  Positive for nosebleeds (x1). Negative for congestion, rhinorrhea, sneezing and sore throat.   Eyes:  Positive for visual disturbance (due to prior stroke). Negative for redness.  Respiratory:  Negative.  Negative for cough, chest tightness, shortness of breath and wheezing.   Cardiovascular: Negative.  Negative for chest pain and palpitations.  Gastrointestinal: Negative.  Negative for abdominal pain, constipation, diarrhea, nausea and vomiting.  Genitourinary:        Incontinent of bladder and stool.   Musculoskeletal:  Positive for gait problem. Negative for arthralgias, back pain, joint swelling and neck pain.       History of falls, walks with cane  Skin:  Negative for rash.  Neurological:  Negative for tremors and numbness.  Psychiatric/Behavioral:  Negative for behavioral problems (Depression), sleep disturbance and suicidal ideas. The patient is not nervous/anxious.    Vital Signs: BP 120/70    Pulse (!) 59    Temp 98.4 F (36.9 C)    Resp 16    Ht 5\' 3"  (1.6 m)    Wt 166 lb 3.2 oz (75.4 kg)    SpO2 99%    BMI 29.44 kg/m    Physical Exam Vitals reviewed.  Constitutional:      General: She is not in acute distress.    Appearance: Normal appearance. She is obese. She is not ill-appearing.  HENT:     Head: Normocephalic and atraumatic.  Eyes:     Pupils: Pupils are equal, round, and reactive to light.  Cardiovascular:     Rate and Rhythm: Normal rate and regular rhythm.  Pulmonary:     Effort: Pulmonary effort is normal. No respiratory distress.  Neurological:     Mental Status: She is alert and oriented to person, place, and time.     Cranial Nerves: No cranial nerve deficit.     Coordination: Coordination normal.     Gait: Gait abnormal.  Psychiatric:        Mood and Affect: Mood normal.        Behavior: Behavior normal.       Assessment/Plan: 1. Left-sided weakness Is sometimes off balance, has a cane that she can use for ambulation but does not use it.  Discussed in detail the importance of using assistive devices for ambulation when necessary to prevent the patient from falling, patient is agreeable to plan and acknowledges that she will use her cane  for ambulation when she needs to.  Patient will continue to work with physical therapy to increase strength in her legs.  2. Fall, subsequent encounter She has a history of falls.  Today is a follow-up for an ED visit related to a recent fall.  She has balance issues and a history of osteoporosis so she is at risk for fractures.  Discussed use of assistive devices for ambulation in detail with patient.  3. Primary hypertension Well-controlled with current medication  4. OSA (obstructive sleep apnea) Patient reports she cannot use CPAP.  Encourage patient to try nasal resistance devices or mouth guards that are made for obstructive sleep apnea to see if they help  at all.  She still reports being sleepy during the day which may be related to the sleep apnea.  5. Age-related osteoporosis without current pathological fracture Patient has a prior diagnosis of osteoporosis which means she is at increased risk of fractures and she has a history of falls left-sided weakness and has a cane that she does not use.  As noted in assessment and plan under previous problems above, patient will start using cane when she needs it  6. Visual field cut Patient has had issues with her vision since her stroke which does affect her balance and her eyes get tired which makes her feel tired  General Counseling: Sarah Barnes verbalizes understanding of the findings of todays visit and agrees with plan of treatment. I have discussed any further diagnostic evaluation that may be needed or ordered today. We also reviewed her medications today. she has been encouraged to call the office with any questions or concerns that should arise related to todays visit.    No orders of the defined types were placed in this encounter.   No orders of the defined types were placed in this encounter.   Return if symptoms worsen or fail to improve, for  and continue physical therapy.   Total time spent:30 Minutes Time spent includes  review of chart, medications, test results, and follow up plan with the patient.   [place] Controlled Substance Database was reviewed by me.  This patient was seen by Jonetta Osgood, FNP-C in collaboration with Dr. Clayborn Bigness as a part of collaborative care agreement.   Jeromy Borcherding R. Valetta Fuller, MSN, FNP-C Internal medicine

## 2021-07-18 ENCOUNTER — Other Ambulatory Visit: Payer: Self-pay

## 2021-07-18 ENCOUNTER — Ambulatory Visit: Payer: Medicare Other

## 2021-07-18 DIAGNOSIS — M6281 Muscle weakness (generalized): Secondary | ICD-10-CM

## 2021-07-18 DIAGNOSIS — R262 Difficulty in walking, not elsewhere classified: Secondary | ICD-10-CM | POA: Diagnosis not present

## 2021-07-18 DIAGNOSIS — M25652 Stiffness of left hip, not elsewhere classified: Secondary | ICD-10-CM | POA: Diagnosis not present

## 2021-07-18 NOTE — Therapy (Signed)
[facility] [address] [place], [address] Phone: [phone]   Fax:  [phone]  Physical Therapy Treatment  Patient Details  Name: Sarah Barnes MRN: [id number] Date of Birth: 1950-09-19 Referring Provider (PT): Drema Dallas PA-C   Encounter Date: 07/18/2021   PT End of Session - 07/18/21 1020     Visit Number 5    Number of Visits 16    Date for PT Re-Evaluation 08/09/21    Authorization Type UHC Medicare primary, [place] Medicaid secondary    Authorization Time Period 06/14/21-08/09/21    Progress Note Due on Visit 10    PT Start Time 1015    PT Stop Time 1055    PT Time Calculation (min) 40 min    Equipment Utilized During Treatment Gait belt    Activity Tolerance Patient tolerated treatment well;No increased pain    Behavior During Therapy WFL for tasks assessed/performed             Past Medical History:  Diagnosis Date   Agoraphobia with panic disorder    Anemia    Arthritis    "all over" (08/13/2016)   Chronic bronchitis (HCC)    COPD (chronic obstructive pulmonary disease) ([place])    Cryptogenic stroke ([place])    a. 06/2020 Head CT: low density caudate nucleus concerning for infarct; b. 07/2020 s/p MDT Linq; c. 11/2020 Finding of Afib on Linq.   Depression    Dyspnea    occ   Dysrhythmia    palpitations occ due to anxiety   ETOH abuse    GAD (generalized anxiety disorder)    GERD (gastroesophageal reflux disease)    Headache    "daily til I got glasses; now have headache once in awhile" (08/13/2016)   Hepatitis 1960   "isolated &  hospitalized for 2 weeks; don't know which kind of hepatitis"    Hernia, hiatal    High cholesterol    Hypertension    Hypertrophic obstructive cardiomyopathy (HCC)    Mild aortic stenosis    a. 01/2019 Echo: EF 55-60%. Mild AS; b. 12/2019 Echo: Nl EF. Mild diast dysfxn. Trace AS/AI/TR. Mild MR.   Nonobstructive CAD (coronary artery disease)    a. 12/2016  Cath: LM nl, LAD min irregs, LCX nl, OM1/2 min irregs, RCA nl, RPDA min irregs. EF 55-65%.   PAD (peripheral artery disease) ([place])    a. 09/29/2020 s/p PTA/DBA to L prox/mid SFA and stenting of L SFA; b. 10/06/2020 PTA of R TP trunk and prox peroenal. PTA/DBA of R SFA and prox R Popliteal. R SFA stenting x 2; c. 10/2020 ABI: R 0.92, L 0.93.   PAF (paroxysmal atrial fibrillation) ([place])    a.11/2020 PAF noted on Linq; b. CHA2DS2VASc = 6-->eliquis.   Pneumonia    "once or twice" (08/13/2016)   PONV (postoperative nausea and vomiting)    Sciatica    Tobacco abuse     Past Surgical History:  Procedure Laterality Date   ANAL FISTULECTOMY  1970s X 3   BACK SURGERY     COLONOSCOPY WITH PROPOFOL N/A 04/24/2021   Procedure: COLONOSCOPY WITH PROPOFOL;  Surgeon: [name], MD;  Location: [facility] ENDOSCOPY;  Service: Gastroenterology;  Laterality: N/A;   COLONOSCOPY WITH PROPOFOL N/A 06/15/2021   Procedure: COLONOSCOPY WITH PROPOFOL;  Surgeon: [name], MD;  Location: [facility] ENDOSCOPY;  Service: Gastroenterology;  Laterality: N/A;   FRACTURE SURGERY     HERNIA REPAIR     KNEE  ARTHROSCOPY Right    LAPAROSCOPIC CHOLECYSTECTOMY     LAPAROSCOPIC INCISIONAL / UMBILICAL / VENTRAL HERNIA REPAIR  08/13/2016   VHR w/mesh   LOWER EXTREMITY ANGIOGRAPHY Left 09/29/2020   Procedure: LOWER EXTREMITY ANGIOGRAPHY;  Surgeon: Algernon Huxley, MD;  Location: [facility] CV LAB;  Service: Cardiovascular;  Laterality: Left;   LOWER EXTREMITY ANGIOGRAPHY Right 10/06/2020   Procedure: LOWER EXTREMITY ANGIOGRAPHY;  Surgeon: Algernon Huxley, MD;  Location: [facility] CV LAB;  Service: Cardiovascular;  Laterality: Right;   LUMBAR DISC SURGERY     "herniated disc"   NASAL FRACTURE SURGERY  1970s X 2   RIGHT/LEFT HEART CATH AND CORONARY ANGIOGRAPHY N/A 12/27/2016   Procedure: RIGHT/LEFT HEART CATH AND CORONARY ANGIOGRAPHY;  Surgeon: Leonie Man, MD;  Location: [facility] INVASIVE CV LAB: Mild-moderate pulmonary hypertension.   Hyperdynamic ventricle noted.  EF 55-65% -hyperdynamic (unable to measure LVOT gradient).  Mildly elevated very tortuous but angiographically normal coronary arteries, suggesting hypertensive heart disease   SHOULDER ARTHROSCOPY WITH ROTATOR CUFF REPAIR Right 1990s?   TONSILLECTOMY  1951   TRANSTHORACIC ECHOCARDIOGRAM  12/2016    EF 60-65% with dynamic outflow tract obstruction at rest. Peak gradient 52 mmHg consistent with HOCM.  GRII DD area. Aortic sclerosis but no stenosis. Systolic anterior motion of mitral valve chordae. Mild mitral stenosis. Moderate LA dilation and mild RA dilation. Peak PA pressures 35 mmHg.   VENTRAL HERNIA REPAIR N/A 08/13/2016   Procedure: LAPAROSCOPIC VENTRAL HERNIA REPAIR WITH MESH;  Surgeon: Judeth Horn, MD;  Location: [facility];  Service: General;  Laterality: N/A;    There were no vitals filed for this visit.   Subjective Assessment - 07/18/21 1018     Subjective Pt doing well today. Report sher Left knee has been giving out on her the last ocuple days, painful upon occurence, otherwise no pain to report. Pt reports tired after last session. Pt reports hip ABDCT exercise is performing without issue. She also report sher balance to feel better earlier todya when walking on a gravel driveway.    Pertinent History Sarah Barnes is a 33yoF who presents to OPPT for evaluation of acute, insidious onset BLE weakness and inability to walk that began week of Christmas 2022 and resulted in 1-2 days in ability to walk, attempts resulted in a fall to floor requiring assist from family to help off floor. Pt referred by PCP, but medical workup has minimal by time of PT evaluation here. Pt denies any paresthesia, but has since has partial improvement in strength, now able to AMB all needed to perform ADL, IADL with intermittent concern of LLE buckling when up for prolonged periods. Pt also reports concordant pain in bilat thighs each day which seems to abate after morning time. Pt  denies any prior history of similar episodes. Per pt- hip imaging is pending. At baseline pt is fully independent in ADL, IADL, community ambulation as needed, no assistive device use, no falls history, pt is a retired Biomedical scientist.              INTERVENTION: -AA/ROM Nustep seat 8, arms 11, level 1 x 5 minutes  Set 1  -STS from chair + airex 1x10 hands free  -LAQ from chair + airex 1x12 bilat 2lb AW -standing heel raises 1x15, BUE support  -standing hamstrings curl 1x12 bilat, 2lb AW  -standing hip ABDCT 1x10x1secH each side   2 minute recovery interval  -STS from chair + airex 1x10 hands free (consider moving down in surface  height next session)  -LAQ from chair + airex 1x12 bilat 2lb AW -standing heel raises 1x15, BUE support  -standing hamstrings curl 1x12 bilat, 2lb AW  -standing hip ABDCT 1x10x1secH each side   2 minute recovery interval  Set 2 -marching in place on airex pad 1x90sec *pt struggles with weight shift over Rt foot, does not lift LLE very high due to confidence  -firm surface forward step taps 1x20 alternating sides  -lateral hurdle stepovers 1x30 alteranting sides, ad lib UE support at treadmill  Set 3 -Seated (chair + airex) red TB clam 1x15, green ball between feet -Seated (chair + airex) yellow TB IR 1x15, green ball between knees (left hip weakness seen)  -seated        PT Education - 07/18/21 1020     Education Details Conitnued to given details on form for various exericses as needed.    Person(s) Educated Patient    Methods Explanation    Comprehension Verbalized understanding              PT Short Term Goals - 06/15/21 0837       PT SHORT TERM GOAL #1   Title Pt to demonstrate correct HEP performance and report good compliancat at home.    Time 4    Period Weeks    Status New    Target Date 07/13/21      PT SHORT TERM GOAL #2   Title Pt to show improved bilat hip ER P/RM to >60 degrees bilat to facilitate improved motor control in  gait and ADL performance.    Baseline 06/14/21: Rt 50, Lt: 45    Time 4    Period Weeks    Status New    Target Date 07/13/21               PT Long Term Goals - 06/15/21 0837       PT LONG TERM GOAL #1   Title Pt to demonstrate FOTO score improvement by >15 to imply improve self-perception of facility in basic moblity for ADL/IADL.    Baseline 06/14/21: 53    Time 8    Period Weeks    Status New    Target Date 08/10/21      PT LONG TERM GOAL #2   Title Pt to demonstrate improved movement tolerance AEB 6MWT performance >1562ft without exacerbation of pain, and ROM testing tolerance without pain exacerbation.    Baseline 1/25: pain increase with 3 min AMB, pain with ROM testing    Time 8    Period Weeks    Status New    Target Date 08/10/21      PT LONG TERM GOAL #3   Title Pt to demonstrate improved motor performance AEB 5xSTS<12sec hands free and MMT 5/5 in hips and knees.    Baseline 1/25: 5xSTS: 17sec, MMT pending    Time 8    Period Weeks    Status New    Target Date 08/10/21                   Plan - 07/18/21 1021     Clinical Impression Statement Pt appears to be moving notably better today, less pain/fatigue limitations. Pt has more power performing STS from chair. Added in more knee-targetting exercises without issue today.Pt continues to make progress toward goals overall.    Personal Factors and Comorbidities Time since onset of injury/illness/exacerbation    Examination-Activity Limitations Locomotion Level;Stand;Transfers    Examination-Participation Restrictions [employer]  Work    Stability/Clinical Decision Making Evolving/Moderate complexity    Clinical Decision Making Moderate    Rehab Potential Good    PT Frequency 2x / week    PT Duration 8 weeks    PT Treatment/Interventions Moist Heat;Electrical Stimulation;Gait training;Stair training;Therapeutic activities;Functional mobility training;Therapeutic exercise;Neuromuscular re-education;Balance  training;Patient/family education;Manual techniques;Passive range of motion;Dry needling    PT Next Visit Plan Continue with gross strengthening of BLE and standing motor control training    PT Home Exercise Plan Access Code: [email or web address]    Consulted and Agree with Plan of Care Patient             Patient will benefit from skilled therapeutic intervention in order to improve the following deficits and impairments:  Abnormal gait, Decreased balance, Cardiopulmonary status limiting activity, Decreased activity tolerance, Decreased mobility, Decreased range of motion, Difficulty walking, Decreased strength  Visit Diagnosis: Stiffness of left hip, not elsewhere classified  Muscle weakness (generalized)  Difficulty in walking, not elsewhere classified     Problem List Patient Active Problem List   Diagnosis Date Noted   OSA (obstructive sleep apnea) 01/03/2021   Other hypersomnia 01/03/2021   Atherosclerosis of native arteries of extremity with intermittent claudication ([place]) 09/13/2020   Stroke ([place]) 07/04/2020   COPD (chronic obstructive pulmonary disease) ([place])    ETOH abuse    HLD (hyperlipidemia)    CAD (coronary artery disease)    Tobacco abuse    Chronic diastolic CHF (congestive heart failure) ([place])    CKD (chronic kidney disease), stage IIIb    Encounter for general adult medical examination with abnormal findings 02/09/2020   Abnormal renal function finding 02/09/2020   Vitamin D deficiency 02/09/2020   Flu vaccine need 02/09/2020   Dysuria 02/09/2020   Bilateral leg pain 11/28/2019   Generalized abdominal pain 11/28/2019   Inflammatory polyarthropathy ([place]) 11/28/2019   Generalized weakness 01/26/2019   Alcohol use disorder    Hypertrophic obstructive cardiomyopathy ([place])    Aortic valve stenosis    Abnormal EKG 12/26/2016   Troponin level elevated 12/26/2016   Syncope 12/25/2016   Heart palpitations - PACs 12/24/2016   Shortness of breath at rest 12/24/2016    Ventral hernia 08/13/2016   Agitation 08/11/2015   Substance induced mood disorder ([place]) 08/11/2015   Panic disorder with agoraphobia 07/13/2015   Major depressive disorder, recurrent severe without psychotic features ([place]) 07/12/2015   Tobacco use disorder 07/12/2015   Substance abuse ([place]) 07/12/2015   Knee pain, bilateral 09/17/2014   Generalized anxiety disorder 05/17/2014   Hypertriglyceridemia 05/05/2014   GERD (gastroesophageal reflux disease) 05/05/2014   Essential hypertension 05/05/2014   10:36 AM, 07/18/21 Etta Grandchild, PT, DPT Physical Therapist - [place] [phone] (Office)   [place] C, PT 07/18/2021, 10:22 AM  [facility] [address] [place], [address] Phone: [phone]   Fax:  [phone]  Name: NATALE BARBA MRN: [id number] Date of Birth: [birth year]

## 2021-07-20 ENCOUNTER — Ambulatory Visit: Payer: Medicare Other | Admitting: Physical Therapy

## 2021-07-24 ENCOUNTER — Ambulatory Visit (INDEPENDENT_AMBULATORY_CARE_PROVIDER_SITE_OTHER): Payer: Medicare Other

## 2021-07-24 DIAGNOSIS — I639 Cerebral infarction, unspecified: Secondary | ICD-10-CM | POA: Diagnosis not present

## 2021-07-24 LAB — CUP PACEART REMOTE DEVICE CHECK
Date Time Interrogation Session: 20230304044817
Implantable Pulse Generator Implant Date: 20220330

## 2021-07-27 ENCOUNTER — Ambulatory Visit: Payer: Medicare Other | Admitting: Physical Therapy

## 2021-08-01 ENCOUNTER — Encounter: Payer: Medicare Other | Admitting: Physical Therapy

## 2021-08-01 NOTE — Progress Notes (Signed)
Carelink Summary Report / Loop Recorder 

## 2021-08-03 ENCOUNTER — Other Ambulatory Visit (INDEPENDENT_AMBULATORY_CARE_PROVIDER_SITE_OTHER): Payer: Self-pay | Admitting: Vascular Surgery

## 2021-08-03 ENCOUNTER — Ambulatory Visit: Payer: Medicare Other | Attending: Physician Assistant | Admitting: Physical Therapy

## 2021-08-03 ENCOUNTER — Encounter: Payer: Self-pay | Admitting: Physical Therapy

## 2021-08-03 ENCOUNTER — Other Ambulatory Visit: Payer: Self-pay

## 2021-08-03 DIAGNOSIS — M25652 Stiffness of left hip, not elsewhere classified: Secondary | ICD-10-CM | POA: Insufficient documentation

## 2021-08-03 DIAGNOSIS — M6281 Muscle weakness (generalized): Secondary | ICD-10-CM | POA: Diagnosis not present

## 2021-08-03 DIAGNOSIS — R262 Difficulty in walking, not elsewhere classified: Secondary | ICD-10-CM | POA: Insufficient documentation

## 2021-08-03 NOTE — Therapy (Addendum)
[facility] ?[address] ?[place], [place], [address] ?Phone: [phone]   Fax:  [phone] ? ?Physical Therapy Treatment ? ?Patient Details  ?Name: Sarah Barnes ?MRN: [id number] ?Date of Birth: Jul 23, 1950 ?Referring Provider (PT): Sarah Dallas PA-C ? ? ?Encounter Date: 08/03/2021 ? ? PT End of Session - 08/03/21 1204   ? ? Visit Number 6   ? Number of Visits 16   ? Date for PT Re-Evaluation 08/09/21   ? Authorization Type UHC Medicare primary, [place] Medicaid secondary   ? Authorization Time Period 06/14/21-08/09/21   ? Progress Note Due on Visit 10   ? PT Start Time 1200   ? PT Stop Time 1230   ? PT Time Calculation (min) 30 min   ? Equipment Utilized During Treatment Gait belt   ? Activity Tolerance Patient tolerated treatment well;No increased pain   ? Behavior During Therapy [facility] for tasks assessed/performed   ? ?  ?  ? ?  ? ? ?Past Medical History:  ?Diagnosis Date  ? Agoraphobia with panic disorder   ? Anemia   ? Arthritis   ? "all over" (08/13/2016)  ? Chronic bronchitis ([place])   ? COPD (chronic obstructive pulmonary disease) ([place])   ? Cryptogenic stroke ([place])   ? a. 06/2020 Head CT: low density caudate nucleus concerning for infarct; b. 07/2020 s/p MDT Linq; c. 11/2020 Finding of Afib on Linq.  ? Depression   ? Dyspnea   ? occ  ? Dysrhythmia   ? palpitations occ due to anxiety  ? ETOH abuse   ? GAD (generalized anxiety disorder)   ? GERD (gastroesophageal reflux disease)   ? Headache   ? "daily til I got glasses; now have headache once in awhile" (08/13/2016)  ? Hepatitis 1960  ? "isolated &  hospitalized for 2 weeks; don't know which kind of hepatitis"   ? Hernia, hiatal   ? High cholesterol   ? Hypertension   ? Hypertrophic obstructive cardiomyopathy ([place])   ? Mild aortic stenosis   ? a. 01/2019 Echo: EF 55-60%. Mild AS; b. 12/2019 Echo: Nl EF. Mild diast dysfxn. Trace AS/AI/TR. Mild MR.  ? Nonobstructive CAD (coronary artery disease)   ? a. 12/2016  Cath: LM nl, LAD min irregs, LCX nl, OM1/2 min irregs, RCA nl, RPDA min irregs. EF 55-65%.  ? PAD (peripheral artery disease) ([place])   ? a. 09/29/2020 s/p PTA/DBA to L prox/mid SFA and stenting of L SFA; b. 10/06/2020 PTA of R TP trunk and prox peroenal. PTA/DBA of R SFA and prox R Popliteal. R SFA stenting x 2; c. 10/2020 ABI: R 0.92, L 0.93.  ? PAF (paroxysmal atrial fibrillation) ([place])   ? a.11/2020 PAF noted on Linq; b. CHA2DS2VASc = 6-->eliquis.  ? Pneumonia   ? "once or twice" (08/13/2016)  ? PONV (postoperative nausea and vomiting)   ? Sciatica   ? Tobacco abuse   ? ? ?Past Surgical History:  ?Procedure Laterality Date  ? ANAL FISTULECTOMY  1970s X 3  ? BACK SURGERY    ? COLONOSCOPY WITH PROPOFOL N/A 04/24/2021  ? Procedure: COLONOSCOPY WITH PROPOFOL;  Surgeon: Sarah Bellows, MD;  Location: [facility] ENDOSCOPY;  Service: Gastroenterology;  Laterality: N/A;  ? COLONOSCOPY WITH PROPOFOL N/A 06/15/2021  ? Procedure: COLONOSCOPY WITH PROPOFOL;  Surgeon: Sarah Bellows, MD;  Location: [facility] ENDOSCOPY;  Service: Gastroenterology;  Laterality: N/A;  ? FRACTURE SURGERY    ? HERNIA REPAIR    ? KNEE  ARTHROSCOPY Right   ? LAPAROSCOPIC CHOLECYSTECTOMY    ? LAPAROSCOPIC INCISIONAL / UMBILICAL / VENTRAL HERNIA REPAIR  08/13/2016  ? VHR w/mesh  ? LOWER EXTREMITY ANGIOGRAPHY Left 09/29/2020  ? Procedure: LOWER EXTREMITY ANGIOGRAPHY;  Surgeon: Sarah Huxley, MD;  Location: [facility] CV LAB;  Service: Cardiovascular;  Laterality: Left;  ? LOWER EXTREMITY ANGIOGRAPHY Right 10/06/2020  ? Procedure: LOWER EXTREMITY ANGIOGRAPHY;  Surgeon: Sarah Huxley, MD;  Location: [facility] LAB;  Service: Cardiovascular;  Laterality: Right;  ? LUMBAR DISC SURGERY    ? "herniated disc"  ? NASAL FRACTURE SURGERY  1970s X 2  ? RIGHT/LEFT HEART CATH AND CORONARY ANGIOGRAPHY N/A 12/27/2016  ? Procedure: RIGHT/LEFT HEART CATH AND CORONARY ANGIOGRAPHY;  Surgeon: Sarah Man, MD;  Location: [facility] INVASIVE CV LAB: Mild-moderate pulmonary hypertension.   Hyperdynamic ventricle noted.  EF 55-65% -hyperdynamic (unable to measure LVOT gradient).  Mildly elevated very tortuous but angiographically normal coronary arteries, suggesting hypertensive heart disease  ? SHOULDER ARTHROSCOPY WITH ROTATOR CUFF REPAIR Right 1990s?  ? TONSILLECTOMY  1951  ? TRANSTHORACIC ECHOCARDIOGRAM  12/2016  ?  EF 60-65% with dynamic outflow tract obstruction at rest. Peak gradient 52 mmHg consistent with HOCM.  GRII DD area. Aortic sclerosis but no stenosis. Systolic anterior motion of mitral valve chordae. Mild mitral stenosis. Moderate LA dilation and mild RA dilation. Peak PA pressures 35 mmHg.  ? VENTRAL HERNIA REPAIR N/A 08/13/2016  ? Procedure: LAPAROSCOPIC VENTRAL HERNIA REPAIR WITH MESH;  Surgeon: Sarah Horn, MD;  Location: [facility];  Service: General;  Laterality: N/A;  ? ? ?There were no vitals filed for this visit. ? ? ? 08/03/21 1201  ?Symptoms/Limitations  ?Subjective Pt presents 15 minutes late because ride that she ordered did not show up on time. Pt reports that her legs feel weaker and her left hip feels in more pain. She has been able to do her exercises. She is concerned about transportation and mentions that it is difficult for her to consistently get to clinic and she cannot order ride before three days.  ?Pertinent History Sarah Barnes is a 71yoF who presents to OPPT for evaluation of acute, insidious onset BLE weakness and inability to walk that began week of Christmas 2022 and resulted in 1-2 days in ability to walk, attempts resulted in a fall to floor requiring assist from family to help off floor. Pt referred by PCP, but medical workup has minimal by time of PT evaluation here. Pt denies any paresthesia, but has since has partial improvement in strength, now able to AMB all needed to perform ADL, IADL with intermittent concern of LLE buckling when up for prolonged periods. Pt also reports concordant pain in bilat thighs each day which seems to abate after morning  time. Pt denies any prior history of similar episodes. Per pt- hip imaging is pending. At baseline pt is fully independent in ADL, IADL, community ambulation as needed, no assistive device use, no falls history, pt is a retired Biomedical scientist.  ?Pain Assessment  ?Currently in Pain? Yes  ?Pain Score 5  ?Pain Location Hip  ?Pain Orientation Left  ?Pain Descriptors / Indicators Aching  ?Pain Type Chronic pain  ? ? ? ?[place]: ? ?Sit to Stand 1 x 10  ?-Increased radiation right sided low back pain to right groin  ? ?Supine Bridges 3 x 10  ?Supine SLR 3 x 10  ? ?Sidelying Hip Abduction with Knee extension 2 x 10  ?Sidelying Hip Abduction with Knee Flexed  2 x 10  ?Seated Hip Abduction 1 x 10 with GTB  ? ?Seated Hip Adduction Isometric with Pillow 1 x 10 with 5 sec squeeze  ? ? ? ? ? ? ? ? ? ? ? ? ? ? ? ? ? ? ? ? ? ? ? ? ? ? ? ? ? ? PT Short Term Goals - 08/03/21 1209   ? ?  ? PT SHORT TERM GOAL #1  ? Title Pt to demonstrate correct HEP performance and report good compliancat at home.   ? Time 4   ? Period Weeks   ? Status New   ? Target Date 07/13/21   ?  ? PT SHORT TERM GOAL #2  ? Title Pt to show improved bilat hip ER P/RM to >60 degrees bilat to facilitate improved motor control in gait and ADL performance.   ? Baseline 06/14/21: Rt 50, Lt: 45   ? Time 4   ? Period Weeks   ? Status New   ? Target Date 07/13/21   ? ?  ?  ? ?  ? ? ? ? PT Long Term Goals - 08/03/21 1328   ? ?  ? PT LONG TERM GOAL #1  ? Title Pt to demonstrate FOTO score improvement by >15 to imply improve self-perception of facility in basic moblity for ADL/IADL.   ? Baseline 06/14/21: 53   ? Time 8   ? Period Weeks   ? Status New   ? Target Date 08/10/21   ?  ? PT LONG TERM GOAL #2  ? Title Pt to demonstrate improved movement tolerance AEB 6MWT performance >1580f without exacerbation of pain, and ROM testing tolerance without pain exacerbation.   ? Baseline 1/25: pain increase with 3 min AMB, pain with ROM testing   ? Time 8   ? Period Weeks   ? Status New   ?  Target Date 08/10/21   ?  ? PT LONG TERM GOAL #3  ? Title Pt to demonstrate improved motor performance AEB 5xSTS<12sec hands free and MMT 5/5 in hips and knees.   ? Baseline 1/25: 5xSTS: 17sec, MMT pending   ? T

## 2021-08-04 ENCOUNTER — Ambulatory Visit (INDEPENDENT_AMBULATORY_CARE_PROVIDER_SITE_OTHER): Payer: Medicare Other | Admitting: Nurse Practitioner

## 2021-08-04 ENCOUNTER — Encounter (INDEPENDENT_AMBULATORY_CARE_PROVIDER_SITE_OTHER): Payer: Self-pay | Admitting: Nurse Practitioner

## 2021-08-04 ENCOUNTER — Ambulatory Visit (INDEPENDENT_AMBULATORY_CARE_PROVIDER_SITE_OTHER): Payer: Medicare Other

## 2021-08-04 VITALS — BP 126/78 | HR 60 | Resp 16 | Wt 167.0 lb

## 2021-08-04 DIAGNOSIS — E785 Hyperlipidemia, unspecified: Secondary | ICD-10-CM | POA: Diagnosis not present

## 2021-08-04 DIAGNOSIS — Z720 Tobacco use: Secondary | ICD-10-CM

## 2021-08-04 DIAGNOSIS — I10 Essential (primary) hypertension: Secondary | ICD-10-CM

## 2021-08-04 DIAGNOSIS — I70213 Atherosclerosis of native arteries of extremities with intermittent claudication, bilateral legs: Secondary | ICD-10-CM

## 2021-08-05 ENCOUNTER — Encounter (INDEPENDENT_AMBULATORY_CARE_PROVIDER_SITE_OTHER): Payer: Self-pay | Admitting: Nurse Practitioner

## 2021-08-05 NOTE — Progress Notes (Signed)
Subjective:    Patient ID: Sarah Barnes, female    DOB: 08-14-1950, 71 y.o.   MRN: [id number] Chief Complaint  Patient presents with   Follow-up    Ultrasound follow up    Sarah Barnes is a 71 year old female that presents today for follow-up of her PAD.  She notes that the weekend before Christmas she had some severe left leg pain and fell was unable to walk due to this pain.  It has since resolved and she has been working with physical therapy since that time.  She denies any significant claudication however.  Has been no rest pain or new wounds or ulcerations.  Today noninvasive studies show a right ABI of 1.02 with a left of 1.06.  This is consistent with the previous studies that were done on 02/03/2021.  Patient also had bilateral lower extremity arterial duplexes which shows patent stents with biphasic waveforms bilaterally with good toe waveforms.   Review of Systems  Neurological:  Positive for weakness.  All other systems reviewed and are negative.     Objective:   Physical Exam Vitals reviewed.  HENT:     Head: Normocephalic.  Cardiovascular:     Rate and Rhythm: Normal rate.     Pulses:          Dorsalis pedis pulses are 1+ on the right side and 1+ on the left side.  Pulmonary:     Effort: Pulmonary effort is normal.  Skin:    General: Skin is warm and dry.  Neurological:     Mental Status: She is alert and oriented to person, place, and time.  Psychiatric:        Mood and Affect: Mood normal.        Behavior: Behavior normal.        Thought Content: Thought content normal.        Judgment: Judgment normal.    BP 126/78 (BP Location: Right Arm)   Pulse 60   Resp 16   Wt 167 lb (75.8 kg)   BMI 29.58 kg/m   Past Medical History:  Diagnosis Date   Agoraphobia with panic disorder    Anemia    Arthritis    "all over" (08/13/2016)   Chronic bronchitis (HCC)    COPD (chronic obstructive pulmonary disease) (HCC)    Cryptogenic stroke (HCC)    a.  06/2020 Head CT: low density caudate nucleus concerning for infarct; b. 07/2020 s/p MDT Linq; c. 11/2020 Finding of Afib on Linq.   Depression    Dyspnea    occ   Dysrhythmia    palpitations occ due to anxiety   ETOH abuse    GAD (generalized anxiety disorder)    GERD (gastroesophageal reflux disease)    Headache    "daily til I got glasses; now have headache once in awhile" (08/13/2016)   Hepatitis 1960   "isolated &  hospitalized for 2 weeks; don't know which kind of hepatitis"    Hernia, hiatal    High cholesterol    Hypertension    Hypertrophic obstructive cardiomyopathy (HCC)    Mild aortic stenosis    a. 01/2019 Echo: EF 55-60%. Mild AS; b. 12/2019 Echo: Nl EF. Mild diast dysfxn. Trace AS/AI/TR. Mild MR.   Nonobstructive CAD (coronary artery disease)    a. 12/2016 Cath: LM nl, LAD min irregs, LCX nl, OM1/2 min irregs, RCA nl, RPDA min irregs. EF 55-65%.   PAD (peripheral artery disease) (HCC)    a. 09/29/2020 s/p  PTA/DBA to L prox/mid SFA and stenting of L SFA; b. 10/06/2020 PTA of R TP trunk and prox peroenal. PTA/DBA of R SFA and prox R Popliteal. R SFA stenting x 2; c. 10/2020 ABI: R 0.92, L 0.93.   PAF (paroxysmal atrial fibrillation) (HCC)    a.11/2020 PAF noted on Linq; b. CHA2DS2VASc = 6-->eliquis.   Pneumonia    "once or twice" (08/13/2016)   PONV (postoperative nausea and vomiting)    Sciatica    Tobacco abuse     Social History   Socioeconomic History   Marital status: Single    Spouse name: Not on file   Number of children: Not on file   Years of education: Not on file   Highest education level: Not on file  Occupational History   Not on file  Tobacco Use   Smoking status: Every Day    Packs/day: 1.00    Years: 49.00    Pack years: 49.00    Types: Cigarettes   Smokeless tobacco: Never  Vaping Use   Vaping Use: Former  Substance and Sexual Activity   Alcohol use: Not Currently    Alcohol/week: 84.0 standard drinks    Types: 36 Cans of beer, 48 Shots of liquor  per week    Comment: occ   Drug use: Not Currently    Frequency: 1.0 times per week    Types: Marijuana, Cocaine    Comment: Cocaine + March 2017   Sexual activity: Not Currently  Other Topics Concern   Not on file  Social History Narrative   Sarah Barnes has generalized anxiety disorder and clearcut Agoraphobia.   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    She currently lives with her sister.   Currently denies substance abuse beyond Tobacco (1-1/2 PPD) & EtOH (beer, vodka) - not ready to discuss quitting.   Social Determinants of Health   Financial Resource Strain: Not on file  Food Insecurity: Not on file  Transportation Needs: Not on file  Physical Activity: Not on file  Stress: Not on file  Social Connections: Not on file  Intimate Partner Violence: Not on file    Past Surgical History:  Procedure Laterality Date   ANAL FISTULECTOMY  1970s X 3   BACK SURGERY     COLONOSCOPY WITH PROPOFOL N/A 04/24/2021   Procedure: COLONOSCOPY WITH PROPOFOL;  Surgeon: Wyline Mood, MD;  Location: [facility] ENDOSCOPY;  Service: Gastroenterology;  Laterality: N/A;   COLONOSCOPY WITH PROPOFOL N/A 06/15/2021   Procedure: COLONOSCOPY WITH PROPOFOL;  Surgeon: Wyline Mood, MD;  Location[facility]COPY;  Service: Gastroenterology;  Laterality: N/A;   FRACTURE SURGERY     HERNIA REPAIR     KNEE ARTHROSCOPY Right    LAPAROSCOPIC CHOLECYSTECTOMY     LAPAROSCOPIC INCISIONAL / UMBILICAL / VENTRAL HERNIA REPAIR  08/13/2016   VHR w/mesh   LOWER EXTREMITY ANGIOGRAPHY Left 09/29/2020   Procedure: LOWER EXTREMITY ANGIOGRAPHY;  Surgeon: Annice Needy, MD;  Location: [facility] INVASIVE CV LAB;  Service: Cardiovascular;  Laterality: Left;   LOWER EXTREMITY ANGIOGRAPHY Right 10/06/2020   Procedure: LOWER EXTREMITY ANGIOGRAPHY;  Surgeon: Annice Needy, MD;  Location: [facility] INVASIVE CV LAB;  Service: Cardiovascular;  Laterality:  Right;   LUMBAR DISC SURGERY     "herniated disc"   NASAL FRACTURE SURGERY  1970s X 2   RIGHT/LEFT HEART CATH AND CORONARY ANGIOGRAPHY N/A 12/27/2016  Procedure: RIGHT/LEFT HEART CATH AND CORONARY ANGIOGRAPHY;  Surgeon: Marykay Lex, MD;  Location: [facility] INVASIVE CV LAB: Mild-moderate pulmonary hypertension.  Hyperdynamic ventricle noted.  EF 55-65% -hyperdynamic (unable to measure LVOT gradient).  Mildly elevated very tortuous but angiographically normal coronary arteries, suggesting hypertensive heart disease   SHOULDER ARTHROSCOPY WITH ROTATOR CUFF REPAIR Right 1990s?   TONSILLECTOMY  1951   TRANSTHORACIC ECHOCARDIOGRAM  12/2016    EF 60-65% with dynamic outflow tract obstruction at rest. Peak gradient 52 mmHg consistent with HOCM.  GRII DD area. Aortic sclerosis but no stenosis. Systolic anterior motion of mitral valve chordae. Mild mitral stenosis. Moderate LA dilation and mild RA dilation. Peak PA pressures 35 mmHg.   VENTRAL HERNIA REPAIR N/A 08/13/2016   Procedure: LAPAROSCOPIC VENTRAL HERNIA REPAIR WITH MESH;  Surgeon: Jimmye Norman, MD;  Location: [facility] OR;  Service: General;  Laterality: N/A;    Family History  Problem Relation Age of Onset   Diabetes Mother    Hyperlipidemia Mother    Hypertension Mother    Heart attack Mother 15   Angina Mother        chronic problem   Hypertension Father    Stroke Father    Cancer Brother     No Known Allergies  CBC Latest Ref Rng & Units 06/29/2021 12/30/2020 07/06/2020  WBC 4.0 - 10.5 K/uL 11.7(H) 13.0(H) 11.6(H)  Hemoglobin 12.0 - 15.0 g/dL 15.2(H) 16.3(H) 13.6  Hematocrit 36.0 - 46.0 % 44.3 47.2(H) 38.7  Platelets 150 - 400 K/uL 186 229 272      CMP     Component Value Date/Time   NA 141 06/29/2021 0337   NA 141 12/30/2020 0846   K 3.2 (L) 06/29/2021 0337   CL 110 06/29/2021 0337   CO2 24 06/29/2021 0337   GLUCOSE 153 (H) 06/29/2021 0337   BUN 15 06/29/2021 0337   BUN 15 12/30/2020 0846   CREATININE 0.63 06/29/2021 0337    CALCIUM 8.2 (L) 06/29/2021 0337   PROT 6.6 06/29/2021 0337   PROT 6.8 12/30/2020 0846   ALBUMIN 3.8 06/29/2021 0337   ALBUMIN 4.6 12/30/2020 0846   AST 22 06/29/2021 0337   ALT 16 06/29/2021 0337   ALKPHOS 50 06/29/2021 0337   BILITOT 0.7 06/29/2021 0337   BILITOT 0.8 12/30/2020 0846   GFRNONAA >60 06/29/2021 0337   GFRAA 35 (L) 11/20/2019 1317     No results found.     Assessment & Plan:   1. Atherosclerosis of native artery of both lower extremities with intermittent claudication (HCC)  Recommend:  The patient has evidence of atherosclerosis of the lower extremities with  no claudication.  The patient does not voice lifestyle limiting changes at this point in time.  Noninvasive studies do not suggest clinically significant change.  No invasive studies, angiography or surgery at this time The patient should continue walking and begin a more formal exercise program.  The patient should continue antiplatelet therapy and aggressive treatment of the lipid abnormalities  No changes in the patient's medications at this time  The patient should continue wearing graduated compression socks 10-15 mmHg strength to control the mild edema.    2. Essential hypertension Continue antihypertensive medications as already ordered, these medications have been reviewed and there are no changes at this time.   3. Hyperlipidemia, unspecified hyperlipidemia type Continue statin as ordered and reviewed, no changes at this time   4. Tobacco abuse Smoking cessation was discussed, 3-10 minutes spent on this topic specifically  Current Outpatient Medications on File Prior to Visit  Medication Sig Dispense Refill   apixaban (ELIQUIS) 5 MG TABS tablet Take 1 tablet (5 mg total) by mouth 2 (two) times daily. 180 tablet 3   atorvastatin (LIPITOR) 40 MG tablet TAKE 1 TABLET BY MOUTH  DAILY 90 tablet 1   buPROPion (WELLBUTRIN XL) 150 MG 24 hr tablet TAKE 1 TABLET BY MOUTH  DAILY 90 tablet 3    busPIRone (BUSPAR) 15 MG tablet TAKE 1 TABLET BY MOUTH  TWICE DAILY 180 tablet 1   carvedilol (COREG) 12.5 MG tablet Take by mouth 2 (two) times daily.     celecoxib (CELEBREX) 100 MG capsule TAKE 1 CAPSULE BY MOUTH  DAILY 90 capsule 3   cilostazol (PLETAL) 50 MG tablet TAKE 1 TABLET BY MOUTH  TWICE DAILY 180 tablet 3   escitalopram (LEXAPRO) 10 MG tablet TAKE 1 TABLET BY MOUTH  DAILY 90 tablet 3   fenofibrate micronized (LOFIBRA) 134 MG capsule Take 1 capsule (134 mg total) by mouth daily before breakfast. NEEDS APPOINTMENT FOR FUTURE REFILLS 15 capsule 0   folic acid (FOLVITE) 1 MG tablet Take 1 tablet (1 mg total) by mouth daily. 30 tablet 0   furosemide (LASIX) 40 MG tablet TAKE 1 TABLET BY MOUTH  DAILY 90 tablet 3   gabapentin (NEURONTIN) 100 MG capsule TAKE 2 CAPSULES BY MOUTH IN THE MORNING, 1 CAPSULE IN  THE AFTERNOON AND 2  CAPSULES AT NIGHT 450 capsule 3   hydrOXYzine (VISTARIL) 25 MG capsule TAKE 1 CAPSULE BY MOUTH  EVERY 8 HOURS AS NEEDED 90 capsule 1   ibandronate (BONIVA) 150 MG tablet Take one  tab po once a month  in the morning with a full glass of water, on an empty stomach, and do not take anything else by mouth or lie down for the next 30 min. 3 tablet 3   meclizine (ANTIVERT) 25 MG tablet Take 1 tablet (25 mg total) by mouth 3 (three) times daily as needed for dizziness. 30 tablet 0   Multiple Vitamin (MULTIVITAMIN WITH MINERALS) TABS tablet Take 1 tablet by mouth daily.     pantoprazole (PROTONIX) 40 MG tablet TAKE 1 TABLET BY MOUTH  DAILY 90 tablet 1   Potassium 99 MG TABS Take by mouth.     predniSONE (DELTASONE) 10 MG tablet Take 1 tablet (10 mg total) by mouth daily with breakfast. take one tab 3 X day for 3 days, then take one tab 2 X a day for 3 days and then take one tab a day for 3 days 18 tablet 0   tiZANidine (ZANAFLEX) 2 MG tablet TAKE 1 TABLET BY MOUTH  EVERY 8 HOURS AS NEEDED FOR MUSCLE SPASM(S) 270 tablet 1   traMADol (ULTRAM) 50 MG tablet Take 1 tablet (50 mg  total) by mouth every 12 (twelve) hours as needed. 30 tablet 0   traZODone (DESYREL) 100 MG tablet TAKE 1 TABLET BY MOUTH  DAILY AT BEDTIME 90 tablet 3   valsartan (DIOVAN) 160 MG tablet TAKE 1 TABLET BY MOUTH  DAILY 90 tablet 3   azithromycin (ZITHROMAX) 250 MG tablet Take 1 tablet (250 mg total) by mouth daily. Take 1 tablet by mouth daily for 10 days (Patient not taking: Reported on 08/04/2021) 10 each 0   No current facility-administered medications on file prior to visit.    There are no Patient Instructions on file for this visit. No follow-ups on file.   Georgiana Spinner, NP

## 2021-08-08 ENCOUNTER — Encounter: Payer: Medicare Other | Admitting: Physical Therapy

## 2021-08-09 ENCOUNTER — Ambulatory Visit: Payer: Medicare Other | Admitting: Physical Therapy

## 2021-08-09 ENCOUNTER — Other Ambulatory Visit: Payer: Self-pay

## 2021-08-09 ENCOUNTER — Encounter: Payer: Self-pay | Admitting: Physical Therapy

## 2021-08-09 ENCOUNTER — Other Ambulatory Visit: Payer: Self-pay | Admitting: Internal Medicine

## 2021-08-09 DIAGNOSIS — R262 Difficulty in walking, not elsewhere classified: Secondary | ICD-10-CM | POA: Diagnosis not present

## 2021-08-09 DIAGNOSIS — M6281 Muscle weakness (generalized): Secondary | ICD-10-CM

## 2021-08-09 DIAGNOSIS — M25652 Stiffness of left hip, not elsewhere classified: Secondary | ICD-10-CM

## 2021-08-09 NOTE — Therapy (Addendum)
?OUTPATIENT PHYSICAL THERAPY PROGRESS NOTE/ Discharge Summary ? ?Dates of Reporting Period: 06/14/21- 08/09/21 ? ?Patient Name: Sarah Barnes ?MRN: [id number] ?DOB:1950-11-05, 71 y.o., female ?Today's Date: 08/09/2021 ? ?PCP: Mylinda Latina, PA-C ?REFERRING PROVIDER: Mylinda Latina, PA* ? ? PT End of Session - 08/09/21 1029   ? ? Visit Number 7   ? Number of Visits 16   ? Date for PT Re-Evaluation 08/09/21   ? Authorization Type [employer] Medicare primary, [place] Medicaid secondary   ? Authorization Time Period 06/14/21-08/09/21   ? Progress Note Due on Visit 10   ? PT Start Time 1020   ? PT Stop Time 1100   ? PT Time Calculation (min) 40 min   ? Equipment Utilized During Treatment Gait belt   ? Activity Tolerance Patient tolerated treatment well;No increased pain   ? Behavior During Therapy [facility] for tasks assessed/performed   ? ?  ?  ? ?  ? ? ?Past Medical History:  ?Diagnosis Date  ? Agoraphobia with panic disorder   ? Anemia   ? Arthritis   ? "all over" (08/13/2016)  ? Chronic bronchitis ([place])   ? COPD (chronic obstructive pulmonary disease) ([place])   ? Cryptogenic stroke ([place])   ? a. 06/2020 Head CT: low density caudate nucleus concerning for infarct; b. 07/2020 s/p MDT Linq; c. 11/2020 Finding of Afib on Linq.  ? Depression   ? Dyspnea   ? occ  ? Dysrhythmia   ? palpitations occ due to anxiety  ? ETOH abuse   ? GAD (generalized anxiety disorder)   ? GERD (gastroesophageal reflux disease)   ? Headache   ? "daily til I got glasses; now have headache once in awhile" (08/13/2016)  ? Hepatitis 1960  ? "isolated &  hospitalized for 2 weeks; don't know which kind of hepatitis"   ? Hernia, hiatal   ? High cholesterol   ? Hypertension   ? Hypertrophic obstructive cardiomyopathy ([place])   ? Mild aortic stenosis   ? a. 01/2019 Echo: EF 55-60%. Mild AS; b. 12/2019 Echo: Nl EF. Mild diast dysfxn. Trace AS/AI/TR. Mild MR.  ? Nonobstructive CAD (coronary artery disease)   ? a. 12/2016 Cath: LM nl, LAD min irregs, LCX nl, OM1/2 min irregs,  RCA nl, RPDA min irregs. EF 55-65%.  ? PAD (peripheral artery disease) ([place])   ? a. 09/29/2020 s/p PTA/DBA to L prox/mid SFA and stenting of L SFA; b. 10/06/2020 PTA of R TP trunk and prox peroenal. PTA/DBA of R SFA and prox R Popliteal. R SFA stenting x 2; c. 10/2020 ABI: R 0.92, L 0.93.  ? PAF (paroxysmal atrial fibrillation) ([place])   ? a.11/2020 PAF noted on Linq; b. CHA2DS2VASc = 6-->eliquis.  ? Pneumonia   ? "once or twice" (08/13/2016)  ? PONV (postoperative nausea and vomiting)   ? Sciatica   ? Tobacco abuse   ? ?Past Surgical History:  ?Procedure Laterality Date  ? ANAL FISTULECTOMY  1970s X 3  ? BACK SURGERY    ? COLONOSCOPY WITH PROPOFOL N/A 04/24/2021  ? Procedure: COLONOSCOPY WITH PROPOFOL;  Surgeon: [name], MD;  Location: [facility] ENDOSCOPY;  Service: Gastroenterology;  Laterality: N/A;  ? COLONOSCOPY WITH PROPOFOL N/A 06/15/2021  ? Procedure: COLONOSCOPY WITH PROPOFOL;  Surgeon: [name], MD;  Location: [facility] ENDOSCOPY;  Service: Gastroenterology;  Laterality: N/A;  ? FRACTURE SURGERY    ? HERNIA REPAIR    ? KNEE ARTHROSCOPY Right   ? LAPAROSCOPIC CHOLECYSTECTOMY    ? LAPAROSCOPIC INCISIONAL /  UMBILICAL / VENTRAL HERNIA REPAIR  08/13/2016  ? VHR w/mesh  ? LOWER EXTREMITY ANGIOGRAPHY Left 09/29/2020  ? Procedure: LOWER EXTREMITY ANGIOGRAPHY;  Surgeon: Algernon Huxley, MD;  Location: [place] CV LAB;  Service: Cardiovascular;  Laterality: Left;  ? LOWER EXTREMITY ANGIOGRAPHY Right 10/06/2020  ? Procedure: LOWER EXTREMITY ANGIOGRAPHY;  Surgeon: Algernon Huxley, MD;  Location: [place] CV LAB;  Service: Cardiovascular;  Laterality: Right;  ? LUMBAR DISC SURGERY    ? "herniated disc"  ? NASAL FRACTURE SURGERY  1970s X 2  ? RIGHT/LEFT HEART CATH AND CORONARY ANGIOGRAPHY N/A 12/27/2016  ? Procedure: RIGHT/LEFT HEART CATH AND CORONARY ANGIOGRAPHY;  Surgeon: Leonie Man, MD;  Location: [facility] INVASIVE CV LAB: Mild-moderate pulmonary hypertension.  Hyperdynamic ventricle noted.  EF 55-65% -hyperdynamic (unable  to measure LVOT gradient).  Mildly elevated very tortuous but angiographically normal coronary arteries, suggesting hypertensive heart disease  ? SHOULDER ARTHROSCOPY WITH ROTATOR CUFF REPAIR Right 1990s?  ? TONSILLECTOMY  1951  ? TRANSTHORACIC ECHOCARDIOGRAM  12/2016  ?  EF 60-65% with dynamic outflow tract obstruction at rest. Peak gradient 52 mmHg consistent with HOCM.  GRII DD area. Aortic sclerosis but no stenosis. Systolic anterior motion of mitral valve chordae. Mild mitral stenosis. Moderate LA dilation and mild RA dilation. Peak PA pressures 35 mmHg.  ? VENTRAL HERNIA REPAIR N/A 08/13/2016  ? Procedure: LAPAROSCOPIC VENTRAL HERNIA REPAIR WITH MESH;  Surgeon: Judeth Horn, MD;  Location: [place];  Service: General;  Laterality: N/A;  ? ?Patient Active Problem List  ? Diagnosis Date Noted  ? OSA (obstructive sleep apnea) 01/03/2021  ? Other hypersomnia 01/03/2021  ? Atherosclerosis of native arteries of extremity with intermittent claudication ([place]) 09/13/2020  ? Stroke ([place]) 07/04/2020  ? COPD (chronic obstructive pulmonary disease) ([place])   ? ETOH abuse   ? HLD (hyperlipidemia)   ? CAD (coronary artery disease)   ? Tobacco abuse   ? Chronic diastolic CHF (congestive heart failure) ([place])   ? CKD (chronic kidney disease), stage IIIb   ? Encounter for general adult medical examination with abnormal findings 02/09/2020  ? Abnormal renal function finding 02/09/2020  ? Vitamin D deficiency 02/09/2020  ? Flu vaccine need 02/09/2020  ? Dysuria 02/09/2020  ? Bilateral leg pain 11/28/2019  ? Generalized abdominal pain 11/28/2019  ? Inflammatory polyarthropathy ([place]) 11/28/2019  ? Generalized weakness 01/26/2019  ? Alcohol use disorder   ? Hypertrophic obstructive cardiomyopathy ([place])   ? Aortic valve stenosis   ? Abnormal EKG 12/26/2016  ? Troponin level elevated 12/26/2016  ? Syncope 12/25/2016  ? Heart palpitations - PACs 12/24/2016  ? Shortness of breath at rest 12/24/2016  ? Ventral hernia 08/13/2016  ? Agitation  08/11/2015  ? Substance induced mood disorder ([place]) 08/11/2015  ? Panic disorder with agoraphobia 07/13/2015  ? Major depressive disorder, recurrent severe without psychotic features ([place]) 07/12/2015  ? Tobacco use disorder 07/12/2015  ? Substance abuse ([place]) 07/12/2015  ? Knee pain, bilateral 09/17/2014  ? Generalized anxiety disorder 05/17/2014  ? Hypertriglyceridemia 05/05/2014  ? GERD (gastroesophageal reflux disease) 05/05/2014  ? Essential hypertension 05/05/2014  ? ? ?REFERRING DIAG: Left Hip Stiffness and difficulty walking  ? ?THERAPY DIAG:  ?Stiffness of left hip, not elsewhere classified ? ?Muscle weakness (generalized) ? ?Difficulty in walking, not elsewhere classified ? ?PERTINENT HISTORY:  ?Nao Linz is a 10yoF who presents to OPPT for evaluation of acute, insidious onset BLE weakness and inability to walk that began week of Christmas 2022 and resulted in  1-2 days in ability to walk, attempts resulted in a fall to floor requiring assist from family to help off floor. Pt referred by PCP, but medical workup has minimal by time of PT evaluation here. Pt denies any paresthesia, but has since has partial improvement in strength, now able to AMB all needed to perform ADL, IADL with intermittent concern of LLE buckling when up for prolonged periods. Pt also reports concordant pain in bilat thighs each day which seems to abate after morning time. Pt denies any prior history of similar episodes. Per pt- hip imaging is pending. At baseline pt is fully independent in ADL, IADL, community ambulation as needed, no assistive device use, no falls history, pt is a retired Biomedical scientist. ? ?PRECAUTIONS: None  ? ?SUBJECTIVE: Pt states that he back pain is feeling better, but she is now feeling anterior knee pain and that this usually increases with cold weather.  ? ?PAIN:  ?Are you having pain? Yes: NPRS scale: 5/10 ?Pain location: 5/10 ?Pain description: Bilateral anterior knees  ?Aggravating factors: walking and weight  bearing  ?Relieving factors: Not sure  ? ? ? ? ?TODAY'S TREATMENT:  ?08/09/21 ? ?Nu-Step Seat level 6  ? ?R/L Hip ER ROM: 45/45  ? ?5 x STS: 13.9 sec  ?            6 mWT 800 ft needed to stop at 4 min 30 sec

## 2021-08-15 ENCOUNTER — Encounter: Payer: Medicare Other | Admitting: Physical Therapy

## 2021-08-17 ENCOUNTER — Encounter: Payer: Medicare Other | Admitting: Physical Therapy

## 2021-08-21 ENCOUNTER — Encounter: Payer: Medicare Other | Admitting: Physical Therapy

## 2021-08-23 ENCOUNTER — Encounter: Payer: Medicare Other | Admitting: Physical Therapy

## 2021-08-24 ENCOUNTER — Other Ambulatory Visit: Payer: Self-pay | Admitting: Physician Assistant

## 2021-08-28 ENCOUNTER — Encounter: Payer: Medicare Other | Admitting: Physical Therapy

## 2021-08-28 ENCOUNTER — Ambulatory Visit (INDEPENDENT_AMBULATORY_CARE_PROVIDER_SITE_OTHER): Payer: Medicare Other

## 2021-08-28 DIAGNOSIS — I639 Cerebral infarction, unspecified: Secondary | ICD-10-CM | POA: Diagnosis not present

## 2021-08-29 ENCOUNTER — Encounter: Payer: Medicare Other | Admitting: Physical Therapy

## 2021-08-29 LAB — CUP PACEART REMOTE DEVICE CHECK
Date Time Interrogation Session: 20230411085849
Implantable Pulse Generator Implant Date: 20220330

## 2021-08-30 ENCOUNTER — Encounter: Payer: Medicare Other | Admitting: Physical Therapy

## 2021-08-31 ENCOUNTER — Encounter: Payer: Medicare Other | Admitting: Physical Therapy

## 2021-09-05 NOTE — Progress Notes (Signed)
?Electrophysiology Office Follow up Visit Note:   ? ?Date:  09/06/2021  ? ?ID:  Sarah Barnes, DOB 1951/03/14, MRN [id number] ? ?PCP:  Mylinda Latina, PA-C  ?[facility] Cardiologist:  Glenetta Hew, MD  ?[facility] Electrophysiologist:  Vickie Epley, MD  ? ? ?Interval History:   ? ?Sarah Barnes is a 71 y.o. female who presents for a follow up visit. They were last seen in clinic 01/18/2022. She has AF on loop recorder and on AC. Her AF burden has increased on the loop recorder.  ? ?Today she tells me that around the time in question with the increased A-fib burden, she had had several glasses of wine.  She did not notice any palpitations.  No lightheadedness or presyncope.  No chest pain.  No shortness of breath. ? ?She tells me that she had a recent fall out of bed while sleeping.  She struck her face and had a significant nosebleed that required presentation to the emergency department.  She tells me that her balance has been off in general which is a chronic issue. ? ? ?  ? ?Past Medical History:  ?Diagnosis Date  ? Agoraphobia with panic disorder   ? Anemia   ? Arthritis   ? "all over" (08/13/2016)  ? Chronic bronchitis ([place])   ? COPD (chronic obstructive pulmonary disease) ([place])   ? Cryptogenic stroke ([place])   ? a. 06/2020 Head CT: low density caudate nucleus concerning for infarct; b. 07/2020 s/p MDT Linq; c. 11/2020 Finding of Afib on Linq.  ? Depression   ? Dyspnea   ? occ  ? Dysrhythmia   ? palpitations occ due to anxiety  ? ETOH abuse   ? GAD (generalized anxiety disorder)   ? GERD (gastroesophageal reflux disease)   ? Headache   ? "daily til I got glasses; now have headache once in awhile" (08/13/2016)  ? Hepatitis 1960  ? "isolated &  hospitalized for 2 weeks; don't know which kind of hepatitis"   ? Hernia, hiatal   ? High cholesterol   ? Hypertension   ? Hypertrophic obstructive cardiomyopathy ([place])   ? Mild aortic stenosis   ? a. 01/2019 Echo: EF 55-60%. Mild AS; b. 12/2019 Echo: Nl EF.  Mild diast dysfxn. Trace AS/AI/TR. Mild MR.  ? Nonobstructive CAD (coronary artery disease)   ? a. 12/2016 Cath: LM nl, LAD min irregs, LCX nl, OM1/2 min irregs, RCA nl, RPDA min irregs. EF 55-65%.  ? PAD (peripheral artery disease) ([place])   ? a. 09/29/2020 s/p PTA/DBA to L prox/mid SFA and stenting of L SFA; b. 10/06/2020 PTA of R TP trunk and prox peroenal. PTA/DBA of R SFA and prox R Popliteal. R SFA stenting x 2; c. 10/2020 ABI: R 0.92, L 0.93.  ? PAF (paroxysmal atrial fibrillation) ([place])   ? a.11/2020 PAF noted on Linq; b. CHA2DS2VASc = 6-->eliquis.  ? Pneumonia   ? "once or twice" (08/13/2016)  ? PONV (postoperative nausea and vomiting)   ? Sciatica   ? Tobacco abuse   ? ? ?Past Surgical History:  ?Procedure Laterality Date  ? ANAL FISTULECTOMY  1970s X 3  ? BACK SURGERY    ? COLONOSCOPY WITH PROPOFOL N/A 04/24/2021  ? Procedure: COLONOSCOPY WITH PROPOFOL;  Surgeon: [name], MD;  Location: [facility] ENDOSCOPY;  Service: Gastroenterology;  Laterality: N/A;  ? COLONOSCOPY WITH PROPOFOL N/A 06/15/2021  ? Procedure: COLONOSCOPY WITH PROPOFOL;  Surgeon: [name], MD;  [facility] ENDOSCOPY;  Service: Gastroenterology;  Laterality: N/A;  ? FRACTURE SURGERY    ? HERNIA REPAIR    ? KNEE ARTHROSCOPY Right   ? LAPAROSCOPIC CHOLECYSTECTOMY    ? LAPAROSCOPIC INCISIONAL / UMBILICAL / VENTRAL HERNIA REPAIR  08/13/2016  ? VHR w/mesh  ? LOWER EXTREMITY ANGIOGRAPHY Left 09/29/2020  ? Procedure: LOWER EXTREMITY ANGIOGRAPHY;  Surgeon: Algernon Huxley, MD;  Location: [place] CV LAB;  Service: Cardiovascular;  Laterality: Left;  ? LOWER EXTREMITY ANGIOGRAPHY Right 10/06/2020  ? Procedure: LOWER EXTREMITY ANGIOGRAPHY;  Surgeon: Algernon Huxley, MD;  Location: [place] CV LAB;  Service: Cardiovascular;  Laterality: Right;  ? LUMBAR DISC SURGERY    ? "herniated disc"  ? NASAL FRACTURE SURGERY  1970s X 2  ? RIGHT/LEFT HEART CATH AND CORONARY ANGIOGRAPHY N/A 12/27/2016  ? Procedure: RIGHT/LEFT HEART CATH AND CORONARY ANGIOGRAPHY;   Surgeon: Leonie Man, MD;  Location: [facility] INVASIVE CV LAB: Mild-moderate pulmonary hypertension.  Hyperdynamic ventricle noted.  EF 55-65% -hyperdynamic (unable to measure LVOT gradient).  Mildly elevated very tortuous but angiographically normal coronary arteries, suggesting hypertensive heart disease  ? SHOULDER ARTHROSCOPY WITH ROTATOR CUFF REPAIR Right 1990s?  ? TONSILLECTOMY  1951  ? TRANSTHORACIC ECHOCARDIOGRAM  12/2016  ?  EF 60-65% with dynamic outflow tract obstruction at rest. Peak gradient 52 mmHg consistent with HOCM.  GRII DD area. Aortic sclerosis but no stenosis. Systolic anterior motion of mitral valve chordae. Mild mitral stenosis. Moderate LA dilation and mild RA dilation. Peak PA pressures 35 mmHg.  ? VENTRAL HERNIA REPAIR N/A 08/13/2016  ? Procedure: LAPAROSCOPIC VENTRAL HERNIA REPAIR WITH MESH;  Surgeon: Judeth Horn, MD;  Location: [place];  Service: General;  Laterality: N/A;  ? ? ?Current Medications: ?Current Meds  ?Medication Sig  ? apixaban (ELIQUIS) 5 MG TABS tablet Take 1 tablet (5 mg total) by mouth 2 (two) times daily.  ? atorvastatin (LIPITOR) 40 MG tablet TAKE 1 TABLET BY MOUTH  DAILY  ? buPROPion (WELLBUTRIN XL) 150 MG 24 hr tablet TAKE 1 TABLET BY MOUTH  DAILY  ? busPIRone (BUSPAR) 15 MG tablet TAKE 1 TABLET BY MOUTH  TWICE DAILY  ? celecoxib (CELEBREX) 100 MG capsule TAKE 1 CAPSULE BY MOUTH  DAILY  ? cilostazol (PLETAL) 50 MG tablet TAKE 1 TABLET BY MOUTH  TWICE DAILY  ? escitalopram (LEXAPRO) 10 MG tablet TAKE 1 TABLET BY MOUTH  DAILY  ? fenofibrate micronized (LOFIBRA) 134 MG capsule Take 1 capsule (134 mg total) by mouth daily before breakfast. NEEDS APPOINTMENT FOR FUTURE REFILLS  ? folic acid (FOLVITE) 1 MG tablet Take 1 tablet (1 mg total) by mouth daily.  ? furosemide (LASIX) 40 MG tablet TAKE 1 TABLET BY MOUTH  DAILY  ? gabapentin (NEURONTIN) 100 MG capsule TAKE 2 CAPSULES BY MOUTH IN THE MORNING, 1 CAPSULE IN  THE AFTERNOON AND 2  CAPSULES AT NIGHT  ? hydrOXYzine  (VISTARIL) 25 MG capsule TAKE 1 CAPSULE BY MOUTH  EVERY 8 HOURS AS NEEDED  ? ibandronate (BONIVA) 150 MG tablet Take one  tab po once a month  in the morning with a full glass of water, on an empty stomach, and do not take anything else by mouth or lie down for the next 30 min.  ? Multiple Vitamin (MULTIVITAMIN WITH MINERALS) TABS tablet Take 1 tablet by mouth daily.  ? pantoprazole (PROTONIX) 40 MG tablet TAKE 1 TABLET BY MOUTH  DAILY  ? Potassium 99 MG TABS Take by mouth.  ? tiZANidine (ZANAFLEX) 2 MG tablet TAKE 1  TABLET BY MOUTH  EVERY 8 HOURS AS NEEDED FOR MUSCLE SPASM(S)  ? traMADol (ULTRAM) 50 MG tablet Take 1 tablet (50 mg total) by mouth every 12 (twelve) hours as needed.  ? traZODone (DESYREL) 100 MG tablet TAKE 1 TABLET BY MOUTH  DAILY AT BEDTIME  ? valsartan (DIOVAN) 160 MG tablet TAKE 1 TABLET BY MOUTH  DAILY  ? [DISCONTINUED] carvedilol (COREG) 12.5 MG tablet Take by mouth 2 (two) times daily.  ?  ? ?Allergies:   Patient has no known allergies.  ? ?Social History  ? ?Socioeconomic History  ? Marital status: Single  ?  Spouse name: Not on file  ? Number of children: Not on file  ? Years of education: Not on file  ? Highest education level: Not on file  ?Occupational History  ? Not on file  ?Tobacco Use  ? Smoking status: Every Day  ?  Packs/day: 1.00  ?  Years: 49.00  ?  Pack years: 49.00  ?  Types: Cigarettes  ? Smokeless tobacco: Never  ?Vaping Use  ? Vaping Use: Former  ?Substance and Sexual Activity  ? Alcohol use: Not Currently  ?  Alcohol/week: 84.0 standard drinks  ?  Types: 36 Cans of beer, 48 Shots of liquor per week  ?  Comment: occ  ? Drug use: Not Currently  ?  Frequency: 1.0 times per week  ?  Types: Marijuana, Cocaine  ?  Comment: Cocaine + March 2017  ? Sexual activity: Not Currently  ?Other Topics Concern  ? Not on file  ?Social History Narrative  ? Margaruite has generalized anxiety disorder and clearcut Agoraphobia.  ? 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   ? She currently lives with her sister.  ? Currently denies substance abuse beyond Tobacco (1-1/2 PPD) & EtOH (beer, vodka) - not ready to d

## 2021-09-06 ENCOUNTER — Encounter: Payer: Self-pay | Admitting: Cardiology

## 2021-09-06 ENCOUNTER — Encounter: Payer: Self-pay | Admitting: *Deleted

## 2021-09-06 ENCOUNTER — Ambulatory Visit (INDEPENDENT_AMBULATORY_CARE_PROVIDER_SITE_OTHER): Payer: Medicare Other | Admitting: Cardiology

## 2021-09-06 VITALS — BP 120/72 | HR 62 | Ht 63.0 in | Wt 165.0 lb

## 2021-09-06 DIAGNOSIS — I639 Cerebral infarction, unspecified: Secondary | ICD-10-CM

## 2021-09-06 DIAGNOSIS — I480 Paroxysmal atrial fibrillation: Secondary | ICD-10-CM

## 2021-09-06 DIAGNOSIS — I10 Essential (primary) hypertension: Secondary | ICD-10-CM

## 2021-09-06 DIAGNOSIS — I421 Obstructive hypertrophic cardiomyopathy: Secondary | ICD-10-CM | POA: Diagnosis not present

## 2021-09-06 MED ORDER — CARVEDILOL 12.5 MG PO TABS: 18.5000 mg | ORAL_TABLET | Freq: Two times a day (BID) | ORAL | 3 refills | Status: DC

## 2021-09-06 NOTE — Patient Instructions (Addendum)
Medications: ?Increase Coreg to 18.75 mg (1.5 tablets) two times daily  ?Your physician recommends that you continue on your current medications as directed. Please refer to the Current Medication list given to you today. ?*If you need a refill on your cardiac medications before your next appointment, please call your pharmacy* ? ?Lab Work: ?None. ?If you have labs (blood work) drawn today and your tests are completely normal, you will receive your results only by: ?MyChart Message (if you have MyChart) OR ?A paper copy in the mail ?If you have any lab test that is abnormal or we need to change your treatment, we will call you to review the results. ? ?Testing/Procedures: ?None. ? ?Follow-Up: ?At [facility], you and your health needs are our priority.  As part of our continuing mission to provide you with exceptional heart care, we have created designated Provider Care Teams.  These Care Teams include your primary Cardiologist (physician) and Advanced Practice Providers (APPs -  Physician Assistants and Nurse Practitioners) who all work together to provide you with the care you need, when you need it. ? ?Your physician wants you to follow-up in: 12 months with Lars Mage or one of the following Advanced Practice Providers on your designated Care Team:   ? ?Ignacia Bayley, NP ?Christell Faith PA ?Cadence Kathlen Mody PA ? ?  You will receive a reminder letter in the mail two months in advance. If you don't receive a letter, please call our office to schedule the follow-up appointment. ? ?We recommend signing up for the patient portal called "MyChart".  Sign up information is provided on this After Visit Summary.  MyChart is used to connect with patients for Virtual Visits (Telemedicine).  Patients are able to view lab/test results, encounter notes, upcoming appointments, etc.  Non-urgent messages can be sent to your provider as well.   ?To learn more about what you can do with MyChart, go to [email or web address].    ? ?Any Other Special Instructions Will Be Listed Below (If Applicable).  ?

## 2021-09-12 NOTE — Progress Notes (Signed)
Carelink Summary Report / Loop Recorder 

## 2021-09-15 ENCOUNTER — Other Ambulatory Visit: Payer: Self-pay | Admitting: Physician Assistant

## 2021-09-18 ENCOUNTER — Other Ambulatory Visit: Payer: Self-pay | Admitting: Internal Medicine

## 2021-09-18 DIAGNOSIS — M79604 Pain in right leg: Secondary | ICD-10-CM

## 2021-09-18 DIAGNOSIS — I739 Peripheral vascular disease, unspecified: Secondary | ICD-10-CM

## 2021-09-18 DIAGNOSIS — I70201 Unspecified atherosclerosis of native arteries of extremities, right leg: Secondary | ICD-10-CM

## 2021-09-27 ENCOUNTER — Other Ambulatory Visit: Payer: Self-pay | Admitting: Physician Assistant

## 2021-09-27 ENCOUNTER — Other Ambulatory Visit: Payer: Self-pay | Admitting: Internal Medicine

## 2021-09-27 DIAGNOSIS — F17218 Nicotine dependence, cigarettes, with other nicotine-induced disorders: Secondary | ICD-10-CM

## 2021-09-29 LAB — CUP PACEART REMOTE DEVICE CHECK
Date Time Interrogation Session: 20230511142239
Implantable Pulse Generator Implant Date: 20220330

## 2021-10-02 ENCOUNTER — Ambulatory Visit (INDEPENDENT_AMBULATORY_CARE_PROVIDER_SITE_OTHER): Payer: Medicare Other

## 2021-10-02 DIAGNOSIS — I639 Cerebral infarction, unspecified: Secondary | ICD-10-CM

## 2021-10-05 ENCOUNTER — Other Ambulatory Visit: Payer: Self-pay | Admitting: Internal Medicine

## 2021-10-13 ENCOUNTER — Other Ambulatory Visit: Payer: Self-pay | Admitting: Physician Assistant

## 2021-10-19 NOTE — Progress Notes (Signed)
Carelink Summary Report / Loop Recorder 

## 2021-10-28 ENCOUNTER — Other Ambulatory Visit: Payer: Self-pay | Admitting: Nurse Practitioner

## 2021-10-28 DIAGNOSIS — I480 Paroxysmal atrial fibrillation: Secondary | ICD-10-CM

## 2021-10-30 NOTE — Telephone Encounter (Signed)
Prescription refill request for Eliquis received. Indication: Afib  Last office visit: 09/04/21 Quentin Ore)  Scr: 0.63 (06/29/21)  Age: 71 Weight: 74.8kg  Appropriate dose and refill sent to requested pharmacy.

## 2021-10-31 ENCOUNTER — Other Ambulatory Visit: Payer: Self-pay | Admitting: Nurse Practitioner

## 2021-10-31 DIAGNOSIS — I480 Paroxysmal atrial fibrillation: Secondary | ICD-10-CM

## 2021-11-04 ENCOUNTER — Other Ambulatory Visit: Payer: Self-pay | Admitting: Physician Assistant

## 2021-11-04 ENCOUNTER — Encounter: Payer: Self-pay | Admitting: Cardiology

## 2021-11-04 ENCOUNTER — Other Ambulatory Visit (INDEPENDENT_AMBULATORY_CARE_PROVIDER_SITE_OTHER): Payer: Self-pay | Admitting: Vascular Surgery

## 2021-11-04 DIAGNOSIS — M064 Inflammatory polyarthropathy: Secondary | ICD-10-CM

## 2021-11-06 ENCOUNTER — Encounter: Payer: Self-pay | Admitting: *Deleted

## 2021-11-06 ENCOUNTER — Ambulatory Visit (INDEPENDENT_AMBULATORY_CARE_PROVIDER_SITE_OTHER): Payer: Medicare Other

## 2021-11-06 DIAGNOSIS — Z006 Encounter for examination for normal comparison and control in clinical research program: Secondary | ICD-10-CM

## 2021-11-06 DIAGNOSIS — I639 Cerebral infarction, unspecified: Secondary | ICD-10-CM | POA: Diagnosis not present

## 2021-11-06 NOTE — Research (Signed)
Spoke with Pt about essence research. She refused more information.

## 2021-11-07 LAB — CUP PACEART REMOTE DEVICE CHECK
Date Time Interrogation Session: 20230620085639
Implantable Pulse Generator Implant Date: 20220330

## 2021-11-09 ENCOUNTER — Encounter: Payer: Self-pay | Admitting: Cardiology

## 2021-11-09 ENCOUNTER — Telehealth: Payer: Self-pay | Admitting: Cardiology

## 2021-11-09 ENCOUNTER — Ambulatory Visit (INDEPENDENT_AMBULATORY_CARE_PROVIDER_SITE_OTHER): Payer: Medicare Other | Admitting: Cardiology

## 2021-11-09 VITALS — BP 126/80 | HR 55 | Ht 63.0 in | Wt 170.4 lb

## 2021-11-09 DIAGNOSIS — D6869 Other thrombophilia: Secondary | ICD-10-CM | POA: Diagnosis not present

## 2021-11-09 DIAGNOSIS — I2510 Atherosclerotic heart disease of native coronary artery without angina pectoris: Secondary | ICD-10-CM

## 2021-11-09 DIAGNOSIS — F17200 Nicotine dependence, unspecified, uncomplicated: Secondary | ICD-10-CM

## 2021-11-09 DIAGNOSIS — E785 Hyperlipidemia, unspecified: Secondary | ICD-10-CM

## 2021-11-09 DIAGNOSIS — I10 Essential (primary) hypertension: Secondary | ICD-10-CM

## 2021-11-09 DIAGNOSIS — I70213 Atherosclerosis of native arteries of extremities with intermittent claudication, bilateral legs: Secondary | ICD-10-CM | POA: Diagnosis not present

## 2021-11-09 DIAGNOSIS — I639 Cerebral infarction, unspecified: Secondary | ICD-10-CM | POA: Diagnosis not present

## 2021-11-09 DIAGNOSIS — I5032 Chronic diastolic (congestive) heart failure: Secondary | ICD-10-CM

## 2021-11-09 DIAGNOSIS — I421 Obstructive hypertrophic cardiomyopathy: Secondary | ICD-10-CM

## 2021-11-09 DIAGNOSIS — I480 Paroxysmal atrial fibrillation: Secondary | ICD-10-CM | POA: Diagnosis not present

## 2021-11-09 DIAGNOSIS — E781 Pure hyperglyceridemia: Secondary | ICD-10-CM

## 2021-11-09 NOTE — Telephone Encounter (Signed)
Attempted to schedule 4 m fu per checkout 11-09-21   Patient not ready and wants a call back in July

## 2021-11-09 NOTE — Patient Instructions (Signed)
Medication Instructions:  - Your physician recommends that you continue on your current medications as directed. Please refer to the Current Medication list given to you today.  *If you need a refill on your cardiac medications before your next appointment, please call your pharmacy*   Lab Work: - none ordered  If you have labs (blood work) drawn today and your tests are completely normal, you will receive your results only by: [place] (if you have MyChart) OR A paper copy in the mail If you have any lab test that is abnormal or we need to change your treatment, we will call you to review the results.   Testing/Procedures: - none ordered   Follow-Up: At [facility], you and your health needs are our priority.  As part of our continuing mission to provide you with exceptional heart care, we have created designated Provider Care Teams.  These Care Teams include your primary Cardiologist (physician) and Advanced Practice Providers (APPs -  Physician Assistants and Nurse Practitioners) who all work together to provide you with the care you need, when you need it.  We recommend signing up for the patient portal called "MyChart".  Sign up information is provided on this After Visit Summary.  MyChart is used to connect with patients for Virtual Visits (Telemedicine).  Patients are able to view lab/test results, encounter notes, upcoming appointments, etc.  Non-urgent messages can be sent to your provider as well.   To learn more about what you can do with MyChart, go to [email or web address].    Your next appointment:   October 2023   The format for your next appointment:   In Person  Provider:   You may see Glenetta Hew, MD or one of the following Advanced Practice Providers on your designated Care Team:   Murray Hodgkins, NP    Other Instructions N/a  Important Information About Sugar

## 2021-11-09 NOTE — Progress Notes (Signed)
Primary Care Provider: Mylinda Latina, PA-C [facility] Cardiologist: Glenetta Hew, MD Electrophysiologist: Vickie Epley, MD  Clinic Note: Chief Complaint  Patient presents with   6 month follow up     Patient c/o shortness of breath, palpitations and chest pain. Medications reviewed by the patient verbally.    Atrial Fibrillation    Not really symptomatic   Cardiomyopathy    MRI evidence of hypertrophic cardiomyopathy, but followed by echoes that is not suggested LVH.   ===================================  ASSESSMENT/PLAN   Problem List Items Addressed This Visit       Cardiology Problems   Essential hypertension (Chronic)    Well-controlled blood pressure now on current dose of carvedilol along with valsartan.      Relevant Orders   EKG 12-Lead (Completed)   Coronary artery disease, non-occlusive (Chronic)    No active anginal symptoms.  On current medications including beta-blocker, statin and ARB. She is on Plavix for PAD in addition to Eliquis for A-fib.      Relevant Orders   EKG 12-Lead (Completed)   Hypercoagulable state due to paroxysmal atrial fibrillation (HCC); CHA2DS2-Vasc: 6 (Chronic)    This patients CHA2DS2-VASc Score and unadjusted Ischemic Stroke Rate (% per year) is equal to 9.7 % stroke rate/year from a score of 6  Above score calculated as 1 point each if present [CHF, HTN, DM, Vascular=MI/PAD/Aortic Plaque, Age if 65-74, or Female] Above score calculated as 2 points each if present [Age > 75, or Stroke/TIA/TE]  Remains on Eliquis.  No bleeding issues.  Okay to hold Eliquis 48 to 72 hours preop for surgeries or procedures. She is also on Plavix for PAD. Will defer decision about holding Plavix for procedures to her vascular surgeon (Dr. Leotis Pain)       Hypertriglyceridemia (Chronic)    Triglycerides look better than they have in the past.  Remains on atorvastatin and fenofibrate.  Should be due for labs to be checked soon by  PCP.      Paroxysmal atrial fibrillation (HCC) (Chronic)    Increased A-fib noted on recorder evaluation when seen by Dr. Quentin Ore.  Beta-blocker dose increased. Interestingly, she is pretty asymptomatic when it comes to the A-fib.  She actually had palpitation issues that she felt in the past which may very well have been the predecessor to A-fib.  Plan: Continue current dose of beta-blocker along with Eliquis.      Atherosclerosis of native arteries of extremity with intermittent claudication (HCC) (Chronic)    Followed by Dr. Corene Cornea dew.  After revascularization she remains on Plavix. Aggressive risk factor modification for CAD.      Chronic diastolic CHF (congestive heart failure) (HCC) (Chronic)    Chronic HFpEF on stable regimen of beta-blocker, ARB and same dose of Lasix.  We talked at the importance of judicious use of sliding scale Lasix, but with her hypertrophic cardiomyopathy, she needs to avoid being dehydrated.      HLD (hyperlipidemia) (Chronic)    On stable regimen.  Last set of cholesterol levels look relatively well controlled with LDL of 57.  Due for labs checked by PCP soon.      Hypertrophic obstructive cardiomyopathy ([place]) - Primary (Chronic)    Very interesting that she has had echocardiograms in the last couple years and said no evidence of LVH and no LVOT gradient.  It was seen that her ventricular septum has reduced in size and we are to believe these echoes.  Unfortunately, I am not  able to visualize the echocardiogram images over the last couple years.  Thankfully, she is not necessarily having any diastolic heart failure symptoms.  She does have a diagnosis of atrial fibrillation.  She has been on high-dose beta-blocker but having some fatigue.  Plan: On high-dose beta-blocker, but with her fatigue it would not appreciate any further.  Her heart rate is 55 bpm. She does have occasional edema but not really having orthopnea or PND.  Seems euvolemic on  exam-continue 40 mg Lasix.  We discussed sliding scale dosing.      Relevant Orders   EKG 12-Lead (Completed)   Stroke ([place]) (Chronic)    Diagnosis cryptogenic stroke, probably related to atrial fibrillation, however, she also has other cardiovascular risk factors.  Plan: Continue Eliquis for A-fib, Plavix for PAD, statin with well-controlled lipids and beta-blocker/ARB combination for blood pressure control. .->  Remaining risk factors smoking which we have discussed smoking cessation.      Relevant Orders   EKG 12-Lead (Completed)     Other   Tobacco use disorder (Chronic)    Still remains reluctant to quit.  She has cut down but not ready to quit..      Other Visit Diagnoses     PAF (paroxysmal atrial fibrillation) ([place])       Relevant Orders   EKG 12-Lead (Completed)   Hyperlipidemia LDL goal <70       Relevant Orders   EKG 12-Lead (Completed)      ===================================  HPI:    Sarah Barnes is a 71 y.o. female with history of PAF (diagnosed on ILR), Hypertrophic Cardiomyopathy (by cardiac MRI) with HFpEF (minimal CAD on cath), "cryptogenic " CVA (February 2022), HTN, PAD, COPD, EtOH use who is being seen today for routine cardiology follow-up at the request of [name], MD.  I last saw Sarah Barnes in December 2018.  She had had an echocardiogram that suggested hypertrophic cardiomyopathy (LVOT gradient of 50 mmHg) while in the hospital.  Also complaining of palpitations.  Sarah Barnes was last seen on 05/05/2021 by Murray Hodgkins, NP -> chronic leg pain.  Still smoking a pack of cigarettes a day.  Still has some exertional dyspnea with standard baseline.  No chest pain.  No palpitations.  No PND orthopnea.  Trivial edema.  Recent fall but no syncope or near syncope.  Mechanical fall.  Some bruising.  She was most recently seen by Dr. Quentin Ore on September 06, 2021 for A-fib follow-up.  Carvedilol dose increased to 18.75 mg twice daily..   Plan was to follow-up with EP in 1 year.  Recent Hospitalizations: N/A  Reviewed  CV studies:    The following studies were reviewed today: (if available, images/films reviewed: From Epic Chart or Care Everywhere) Lower extremity Dopplers 08/04/2021: normal  Transthoracic Echo 01/01/2020 ([place]): "Mild LVH" with normal LV size and function.  EF> 43%; diastolic function noted.  Normal RV size and function.  Dilated left atrium the right atrium.  Borderline aortic sclerosis.  Mild MAC => ??  Cardiac MRI 01/16/2017: Findings Consistent with Hypertrophic Cardiomyopathy-septal thickness 19 mm.  Moderate MR.  Moderate LAE.  EF 72%.  Trileaflet AOV with partial fusion of the R&L cusps.  LVOT turbulence with transient chordal SAM  Interval History:   Sarah Barnes is being seen here today for follow-up-the first time that I have seen her in 5 years. She still has occasional palpitations off-and-on but does not seem to feel irregular or  fast heart rates.  She feels like her energy level is totally decreased after increasing her beta-blocker dose.  She does not really give [place] walking or other activity because her balance is "off since her stroke".  Thankfully, has not had any further falls.  She still has some claudication issues when she is able to walk farther distance than she usually does.   The metal walking she does she does not necessarily notice exertional dyspnea, but admittedly is not all that active.  She denies any PND, orthopnea but does have some mild occasional lower extremity edema.  She has had some falls but not recently because of balance and dizziness, but has not had any syncope or near syncope.  REVIEWED OF SYSTEMS   Review of Systems  Constitutional:  Positive for malaise/fatigue. Negative for fever and weight loss.  HENT:  Negative for congestion and nosebleeds.   Respiratory:  Positive for cough (Chronic cough), shortness of breath and wheezing. Negative for sputum  production.        Pretty much her baseline.  Cardiovascular:        Per HPI  Gastrointestinal:  Negative for blood in stool and melena.  Genitourinary:  Negative for dysuria, flank pain and hematuria.  Musculoskeletal:  Positive for back pain, falls (She has had 1 fall in the bathroom where her legs gave out.  No falls since.) and joint pain. Negative for myalgias.  Neurological:  Positive for dizziness (Off balance), focal weakness (Still some mild residual weakness from her stroke.) and headaches.  Psychiatric/Behavioral:  Positive for memory loss. Negative for depression. The patient is not nervous/anxious and does not have insomnia.     I have reviewed and (if needed) personally updated the patient's problem list, medications, allergies, past medical and surgical history, social and family history.   PAST MEDICAL HISTORY   Past Medical History:  Diagnosis Date   Agoraphobia with panic disorder    Anemia    Arthritis    "all over" (08/13/2016)   Chronic bronchitis (HCC)    COPD (chronic obstructive pulmonary disease) ([place])    Cryptogenic stroke ([place])    a. 06/2020 Head CT: low density caudate nucleus concerning for infarct; b. 07/2020 s/p MDT Linq; c. 11/2020 Finding of Afib on Linq.   Depression    ETOH abuse    GAD (generalized anxiety disorder)    GERD (gastroesophageal reflux disease)    Headache    "daily til I got glasses; now have headache once in awhile" (08/13/2016)   Hepatitis 1960   "isolated &  hospitalized for 2 weeks; don't know which kind of hepatitis"    Hernia, hiatal    High cholesterol    Hypertension    Hypertrophic obstructive cardiomyopathy ([place]) 12/2016   Echo showed increased LV wall thickness with grade 2 mmHg LVOT gradient and SAM along with GRII DD.  Consistent with HOCM.   Mild aortic stenosis    a. 01/2019 Echo: EF 55-60%. Mild AS; b. 12/2019 Echo: Nl EF. Mild diast dysfxn. Trace AS/AI/TR. Mild MR.   Nonobstructive CAD (coronary artery disease)    a.  12/2016 Cath: LM nl, LAD min irregs, LCX nl, OM1/2 min irregs, RCA nl, RPDA min irregs. EF 55-65%.   PAD (peripheral artery disease) ([place])    a. 09/29/2020 s/p PTA/DBA to L prox/mid SFA and stenting of L SFA; b. 10/06/2020 PTA of R TP trunk and prox peroenal. PTA/DBA of R SFA and prox R Popliteal. R SFA stenting x 2;  c. 10/2020 ABI: R 0.92, L 0.93.   PAF (paroxysmal atrial fibrillation) ([place])    a.11/2020 PAF noted on Linq; b. CHA2DS2VASc = 6-->eliquis.   Pneumonia    "once or twice" (08/13/2016)   PONV (postoperative nausea and vomiting)    Sciatica    Tobacco abuse     PAST SURGICAL HISTORY   Past Surgical History:  Procedure Laterality Date   ANAL FISTULECTOMY  1970s X 3   BACK SURGERY     Cardiac MRI     Hypertrophic cardiomyopathy with septal thickness of 19 mm. EF 72%, moderate MR. . Moderate LAD. Partial fusion of right and left cusps of the aortic valve- no stenosis. LVOT turbulence , transient SAM   COLONOSCOPY WITH PROPOFOL N/A 04/24/2021   Procedure: COLONOSCOPY WITH PROPOFOL;  Surgeon: [name], MD;  Location[facility] END[facility]COPY;  Service: Gastroenterology;  Laterality: N/A;   COLONOSCOPY WITH PROPOFOL N/A 06/15/2021   Procedure: COLONOSCOPY WITH PROPOFOL;  Surgeon: [name], MD;  Location: [facility] ENDOSCOPY;  Service: Gastroenterology;  Laterality: N/A;   FRACTURE SURGERY     HERNIA REPAIR     KNEE ARTHROSCOPY Right    LAPAROSCOPIC CHOLECYSTECTOMY     LAPAROSCOPIC INCISIONAL / UMBILICAL / VENTRAL HERNIA REPAIR  08/13/2016   [place] w/mesh   LOWER EXTREMITY ANGIOGRAPHY Left 09/29/2020   Procedure: LOWER EXTREMITY ANGIOGRAPHY;  Surgeon: Algernon Huxley, MD;  Location: [place] CV LAB;  Service: Cardiovascular;  Laterality: Left;   LOWER EXTREMITY ANGIOGRAPHY Right 10/06/2020   Procedure: LOWER EXTREMITY ANGIOGRAPHY;  Surgeon: Algernon Huxley, MD;  Location: [facility] CV LAB;  Service: Cardiovascular;  Laterality: Right;   LUMBAR DISC SURGERY     "herniated disc"   NASAL  FRACTURE SURGERY  1970s X 2   RIGHT/LEFT HEART CATH AND CORONARY ANGIOGRAPHY N/A 12/27/2016   Procedure: RIGHT/LEFT HEART CATH AND CORONARY ANGIOGRAPHY;  Surgeon: Leonie Man, MD;  Location: [place] INVASIVE CV LAB: Mild-moderate pulmonary hypertension.  Hyperdynamic ventricle noted.  EF 55-65% -hyperdynamic (unable to measure LVOT gradient).  Mildly elevated very tortuous but angiographically normal coronary arteries, suggesting hypertensive heart disease   SHOULDER ARTHROSCOPY WITH ROTATOR CUFF REPAIR Right 1990s?   TONSILLECTOMY  1951   TRANSTHORACIC ECHOCARDIOGRAM  01/05/2017    EF 60-65% with dynamic outflow tract obstruction at rest. Peak gradient 52 mmHg consistent with HOCM.  GRII DD area. Aortic sclerosis but no stenosis. Systolic anterior motion of mitral valve chordae. Mild mitral stenosis. Moderate LA dilation and mild RA dilation. Peak PA pressures 35 mmHg.   TRANSTHORACIC ECHOCARDIOGRAM  01/08/2020   [facility]: EF 55 to 60%.  GR 1 DD.  Nodularity calcification.  Trivial AS/AR.  Mild MR   VENTRAL HERNIA REPAIR N/A 08/13/2016   Procedure: LAPAROSCOPIC VENTRAL HERNIA REPAIR WITH MESH;  Surgeon: Judeth Horn, MD;  Location: [facility];  Service: General;  Laterality: N/A;   Diagnostic Diagram Cath 12/2016       Immunization History  Administered Date(s) Administered   Influenza Inj Mdck Quad Pf 01/22/2020   Influenza, High Dose Seasonal PF 02/21/2017   Influenza,inj,Quad PF,6+ Mos 07/08/2015, 02/23/2016   Influenza-Unspecified 03/01/2021   PFIZER(Purple Top)SARS-COV-2 Vaccination 08/09/2019, 08/30/2019, 05/13/2020, 01/14/2021   Pneumococcal Polysaccharide-23 07/08/2015   Tdap 08/11/2014   Zoster Recombinat (Shingrix) 03/01/2021   Zoster, Unspecified 01/14/2021    MEDICATIONS/ALLERGIES   Current Meds  Medication Sig   apixaban (ELIQUIS) 5 MG TABS tablet TAKE 1 TABLET BY MOUTH  TWICE DAILY   atorvastatin (LIPITOR) 40 MG tablet TAKE  1 TABLET BY MOUTH DAILY   buPROPion (WELLBUTRIN XL)  150 MG 24 hr tablet TAKE 1 TABLET BY MOUTH  DAILY   busPIRone (BUSPAR) 15 MG tablet TAKE 1 TABLET BY MOUTH  TWICE DAILY   carvedilol (COREG) 12.5 MG tablet Take 1.5 tablets (18.75 mg total) by mouth 2 (two) times daily.   celecoxib (CELEBREX) 100 MG capsule TAKE 1 CAPSULE BY MOUTH  DAILY   cilostazol (PLETAL) 50 MG tablet TAKE 1 TABLET BY MOUTH  TWICE DAILY   clopidogrel (PLAVIX) 75 MG tablet TAKE 1 TABLET BY MOUTH  DAILY   escitalopram (LEXAPRO) 10 MG tablet TAKE 1 TABLET BY MOUTH  DAILY   fenofibrate micronized (LOFIBRA) 134 MG capsule Take 1 capsule (134 mg total) by mouth daily before breakfast. NEEDS APPOINTMENT FOR FUTURE REFILLS   folic acid (FOLVITE) 1 MG tablet Take 1 tablet (1 mg total) by mouth daily.   furosemide (LASIX) 40 MG tablet TAKE 1 TABLET BY MOUTH  DAILY   gabapentin (NEURONTIN) 100 MG capsule TAKE 2 CAPSULES BY MOUTH IN THE MORNING , 1 CAPSULE IN  THE AFTERNOON AND 2  CAPSULES AT NIGHT   hydrOXYzine (VISTARIL) 25 MG capsule TAKE 1 CAPSULE BY MOUTH EVERY 8  HOURS AS NEEDED   Multiple Vitamin (MULTIVITAMIN WITH MINERALS) TABS tablet Take 1 tablet by mouth daily.   pantoprazole (PROTONIX) 40 MG tablet TAKE 1 TABLET BY MOUTH  DAILY   Potassium 99 MG TABS Take by mouth.   tiZANidine (ZANAFLEX) 2 MG tablet TAKE 1 TABLET BY MOUTH  EVERY 8 HOURS AS NEEDED FOR MUSCLE SPASM(S)   traMADol (ULTRAM) 50 MG tablet Take 1 tablet (50 mg total) by mouth every 12 (twelve) hours as needed.   traZODone (DESYREL) 100 MG tablet TAKE 1 TABLET BY MOUTH  DAILY AT BEDTIME   valsartan (DIOVAN) 160 MG tablet TAKE 1 TABLET BY MOUTH  DAILY   [DISCONTINUED] ibandronate (BONIVA) 150 MG tablet Take one  tab po once a month  in the morning with a full glass of water, on an empty stomach, and do not take anything else by mouth or lie down for the next 30 min.    No Known Allergies  SOCIAL HISTORY/FAMILY HISTORY   Reviewed in Epic:   Social History   Tobacco Use   Smoking status: Every Day     Packs/day: 1.00    Years: 49.00    Total pack years: 49.00    Types: Cigarettes   Smokeless tobacco: Never  Vaping Use   Vaping Use: Former  Substance Use Topics   Alcohol use: Not Currently    Alcohol/week: 84.0 standard drinks of alcohol    Types: 36 Cans of beer, 48 Shots of liquor per week    Comment: occ   Drug use: Not Currently    Frequency: 1.0 times per week    Types: Marijuana, Cocaine    Comment: Cocaine + March 2017   Social History   Social History Narrative   Azrael has generalized anxiety disorder and clearcut Agoraphobia.   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    She currently lives with her sister.   Currently denies substance abuse beyond Tobacco (1-1/2 PPD) & EtOH (beer, vodka) - not ready to discuss quitting.   Family History  Problem Relation Age of Onset   Diabetes Mother    Hyperlipidemia Mother  Hypertension Mother    Heart attack Mother 83   Angina Mother        chronic problem   Hypertension Father    Stroke Father    Cancer Brother     OBJCTIVE -PE, EKG, labs   Wt Readings from Last 3 Encounters:  11/09/21 170 lb 6 oz (77.3 kg)  09/06/21 165 lb (74.8 kg)  08/04/21 167 lb (75.8 kg)    Physical Exam: BP 126/80 (BP Location: Left Arm, Patient Position: Sitting, Cuff Size: Normal)   Pulse (!) 55   Ht _0  (1.6 m)   Wt 170 lb 6 oz (77.3 kg)   SpO2 92%   BMI 30.18 kg/m  Physical Exam Constitutional:      General: She is not in acute distress.    Appearance: Normal appearance. She is obese. She is not ill-appearing or toxic-appearing.     Comments: Appears slightly older than her age  HENT:     Head: Normocephalic and atraumatic.  Neck:     Vascular: No carotid bruit.  Cardiovascular:     Rate and Rhythm: Regular rhythm. Bradycardia present. Occasional Extrasystoles are present.    Chest Wall: PMI is not displaced.     Pulses:  Decreased pulses (Diminished pedal pulses).     Heart sounds: Murmur heard.     High-pitched harsh crescendo-decrescendo midsystolic murmur is present with a grade of 2/6 at the upper right sternal border radiating to the neck.     No friction rub. No gallop.  Pulmonary:     Effort: Pulmonary effort is normal. No respiratory distress.     Breath sounds: Wheezing and rhonchi present. No rales.     Comments: Diminished breath sounds throughout. Chest:     Chest wall: No tenderness.  Musculoskeletal:        General: No swelling. Normal range of motion.     Cervical back: Normal range of motion and neck supple.  Skin:    General: Skin is warm and dry.  Neurological:     General: No focal deficit present.     Mental Status: She is alert and oriented to person, place, and time.     Gait: Gait abnormal.  Psychiatric:        Behavior: Behavior normal.        Thought Content: Thought content normal.        Judgment: Judgment normal.     Adult ECG Report  Rate: 55;  Rhythm: sinus bradycardia and otherwise normal axis, intervals and durations.  Borderline low voltage. ;   Narrative Interpretation: Stable  Recent Labs: Reviewed.  Due for labs this year.  06/29/2021: Na+ 141, K+ 3.2, Cl 110, CO2 24, BUN 15, Cr 0.63, Glu 133, Ca++ 8.2.  ALT 16, AST 22, alk phos 50.;  CBC: W11.7, H/H15.2/44.3, PLT 186. Lab Results  Component Value Date   CHOL 145 12/30/2020   HDL 40 12/30/2020   LDLCALC 57 12/30/2020   LDLDIRECT 66.0 10/20/2015   TRIG 310 (H) 12/30/2020   CHOLHDL 4.7 07/05/2020   Lab Results  Component Value Date   CREATININE 0.63 06/29/2021   BUN 15 06/29/2021   NA 141 06/29/2021   K 3.2 (L) 06/29/2021   CL 110 06/29/2021   CO2 24 06/29/2021      Latest Ref Rng & Units 06/29/2021    3:37 AM 12/30/2020    8:46 AM 07/06/2020    9:41 PM  CBC  WBC 4.0 - 10.5 K/uL 11.7  13.0  11.6   Hemoglobin 12.0 - 15.0 g/dL 15.2  16.3  13.6   Hematocrit 36.0 - 46.0 % 44.3  47.2  38.7   Platelets  150 - 400 K/uL 186  229  272     Lab Results  Component Value Date   HGBA1C 5.1 07/05/2020   Lab Results  Component Value Date   TSH 1.890 12/30/2020    ==================================================  COVID-19 Education: The signs and symptoms of COVID-19 were discussed with the patient and how to seek care for testing (follow up with PCP or arrange E-visit).    I spent a total of 22 minutes with the patient spent in direct patient consultation.  Additional time spent with chart review  / charting (studies, outside notes, etc): 36 min => I have not seen her in 5 years.  Multiple clinic visits, echoes, other studies have all been done since I seen her.  Extensive chart review. Total Time: 58 min  Current medicines are reviewed at length with the patient today.  (+/- concerns) none  This visit occurred during the SARS-CoV-2 public health emergency.  Safety protocols were in place, including screening questions prior to the visit, additional usage of staff PPE, and extensive cleaning of exam room while observing appropriate contact time as indicated for disinfecting solutions.  Notice: This dictation was prepared with Dragon dictation along with smart phrase technology. Any transcriptional errors that result from this process are unintentional and may not be corrected upon review.   Studies Ordered:  Orders Placed This Encounter  Procedures   EKG 12-Lead   No orders of the defined types were placed in this encounter.  Patient Instructions / Medication Changes & Studies & Tests Ordered   Patient Instructions  Medication Instructions:  - Your physician recommends that you continue on your current medications as directed. Please refer to the Current Medication list given to you today.  *If you need a refill on your cardiac medications before your next appointment, please call your pharmacy*   Lab Work: - none ordered  If you have labs (blood work) drawn today and your  tests are completely normal, you will receive your results only by: [facility] (if you have MyChart) OR A paper copy in the mail If you have any lab test that is abnormal or we need to change your treatment, we will call you to review the results.   Testing/Procedures: - none ordered   Follow-Up: At [facility], you and your health needs are our priority.  As part of our continuing mission to provide you with exceptional heart care, we have created designated Provider Care Teams.  These Care Teams include your primary Cardiologist (physician) and Advanced Practice Providers (APPs -  Physician Assistants and Nurse Practitioners) who all work together to provide you with the care you need, when you need it.  We recommend signing up for the patient portal called "MyChart".  Sign up information is provided on this After Visit Summary.  MyChart is used to connect with patients for Virtual Visits (Telemedicine).  Patients are able to view lab/test results, encounter notes, upcoming appointments, etc.  Non-urgent messages can be sent to your provider as well.   To learn more about what you can do with MyChart, go to [email or web address].    Your next appointment:   October 2023   The format for your next appointment:   In Person  Provider:   You may see Glenetta Hew, MD or one of the following  Advanced Practice Providers on your designated Care Team:   Murray Hodgkins, NP    Other Instructions N/a  Important Information About Sugar          Glenetta Hew, M.D., M.S. Interventional Cardiologist   Pager # [phone] Phone # [phone] [address]. [place], [place] [address]   Thank you for choosing [facility] [place]!!

## 2021-11-23 NOTE — Progress Notes (Signed)
Carelink Summary Report / Loop Recorder 

## 2021-12-06 DIAGNOSIS — H2513 Age-related nuclear cataract, bilateral: Secondary | ICD-10-CM | POA: Diagnosis not present

## 2021-12-06 DIAGNOSIS — H34231 Retinal artery branch occlusion, right eye: Secondary | ICD-10-CM | POA: Diagnosis not present

## 2021-12-06 DIAGNOSIS — H53451 Other localized visual field defect, right eye: Secondary | ICD-10-CM | POA: Diagnosis not present

## 2021-12-08 ENCOUNTER — Other Ambulatory Visit: Payer: Self-pay | Admitting: Physician Assistant

## 2021-12-08 DIAGNOSIS — M810 Age-related osteoporosis without current pathological fracture: Secondary | ICD-10-CM

## 2021-12-09 ENCOUNTER — Encounter: Payer: Self-pay | Admitting: Cardiology

## 2021-12-09 DIAGNOSIS — I480 Paroxysmal atrial fibrillation: Secondary | ICD-10-CM | POA: Insufficient documentation

## 2021-12-09 NOTE — Assessment & Plan Note (Signed)
Well-controlled blood pressure now on current dose of carvedilol along with valsartan.

## 2021-12-09 NOTE — Assessment & Plan Note (Signed)
Still remains reluctant to quit.  She has cut down but not ready to quit.Marland Kitchen

## 2021-12-09 NOTE — Assessment & Plan Note (Signed)
On stable regimen.  Last set of cholesterol levels look relatively well controlled with LDL of 57.  Due for labs checked by PCP soon.

## 2021-12-09 NOTE — Assessment & Plan Note (Signed)
Followed by Dr. Corene Cornea dew.  After revascularization she remains on Plavix. Aggressive risk factor modification for CAD.

## 2021-12-09 NOTE — Assessment & Plan Note (Signed)
Triglycerides look better than they have in the past.  Remains on atorvastatin and fenofibrate.  Should be due for labs to be checked soon by PCP.

## 2021-12-09 NOTE — Assessment & Plan Note (Addendum)
This patients CHA2DS2-VASc Score and unadjusted Ischemic Stroke Rate (% per year) is equal to 9.7 % stroke rate/year from a score of 6  Above score calculated as 1 point each if present [CHF, HTN, DM, Vascular=MI/PAD/Aortic Plaque, Age if 65-74, or Female] Above score calculated as 2 points each if present [Age > 75, or Stroke/TIA/TE]   Remains on Eliquis.  No bleeding issues.   Okay to hold Eliquis 48 to 72 hours preop for surgeries or procedures.  She is also on Plavix for PAD.  Will defer decision about holding Plavix for procedures to her vascular surgeon (Dr. Leotis Pain)

## 2021-12-09 NOTE — Assessment & Plan Note (Signed)
Increased A-fib noted on recorder evaluation when seen by Dr. Quentin Ore.  Beta-blocker dose increased. Interestingly, she is pretty asymptomatic when it comes to the A-fib.  She actually had palpitation issues that she felt in the past which may very well have been the predecessor to A-fib.  Plan: Continue current dose of beta-blocker along with Eliquis.

## 2021-12-09 NOTE — Assessment & Plan Note (Signed)
Chronic HFpEF on stable regimen of beta-blocker, ARB and same dose of Lasix.  We talked at the importance of judicious use of sliding scale Lasix, but with her hypertrophic cardiomyopathy, she needs to avoid being dehydrated.

## 2021-12-09 NOTE — Assessment & Plan Note (Signed)
No active anginal symptoms.  On current medications including beta-blocker, statin and ARB. She is on Plavix for PAD in addition to Eliquis for A-fib.

## 2021-12-09 NOTE — Assessment & Plan Note (Signed)
Diagnosis cryptogenic stroke, probably related to atrial fibrillation, however, she also has other cardiovascular risk factors.  Plan: Continue Eliquis for A-fib, Plavix for PAD, statin with well-controlled lipids and beta-blocker/ARB combination for blood pressure control. .->  Remaining risk factors smoking which we have discussed smoking cessation.

## 2021-12-09 NOTE — Assessment & Plan Note (Signed)
Very interesting that she has had echocardiograms in the last couple years and said no evidence of LVH and no LVOT gradient.  It was seen that her ventricular septum has reduced in size and we are to believe these echoes.  Unfortunately, I am not able to visualize the echocardiogram images over the last couple years.  Thankfully, she is not necessarily having any diastolic heart failure symptoms.  She does have a diagnosis of atrial fibrillation.  She has been on high-dose beta-blocker but having some fatigue.  Plan:  On high-dose beta-blocker, but with her fatigue it would not appreciate any further.  Her heart rate is 55 bpm.  She does have occasional edema but not really having orthopnea or PND.  Seems euvolemic on exam-continue 40 mg Lasix.  We discussed sliding scale dosing.

## 2021-12-11 ENCOUNTER — Ambulatory Visit (INDEPENDENT_AMBULATORY_CARE_PROVIDER_SITE_OTHER): Payer: Medicare Other

## 2021-12-11 DIAGNOSIS — I639 Cerebral infarction, unspecified: Secondary | ICD-10-CM | POA: Diagnosis not present

## 2021-12-11 LAB — CUP PACEART REMOTE DEVICE CHECK
Date Time Interrogation Session: 20230724123930
Implantable Pulse Generator Implant Date: 20220330

## 2021-12-21 NOTE — Telephone Encounter (Signed)
Recall in place closing encounter

## 2022-01-10 NOTE — Progress Notes (Signed)
Carelink Summary Report / Loop Recorder 

## 2022-02-05 ENCOUNTER — Ambulatory Visit (INDEPENDENT_AMBULATORY_CARE_PROVIDER_SITE_OTHER): Payer: Medicare Other | Admitting: Physician Assistant

## 2022-02-05 ENCOUNTER — Encounter: Payer: Self-pay | Admitting: Physician Assistant

## 2022-02-05 ENCOUNTER — Telehealth: Payer: Self-pay

## 2022-02-05 VITALS — BP 130/80 | HR 58 | Temp 98.4°F | Resp 16 | Ht 63.0 in | Wt 174.8 lb

## 2022-02-05 DIAGNOSIS — E781 Pure hyperglyceridemia: Secondary | ICD-10-CM

## 2022-02-05 DIAGNOSIS — E559 Vitamin D deficiency, unspecified: Secondary | ICD-10-CM

## 2022-02-05 DIAGNOSIS — E538 Deficiency of other specified B group vitamins: Secondary | ICD-10-CM | POA: Diagnosis not present

## 2022-02-05 DIAGNOSIS — R0602 Shortness of breath: Secondary | ICD-10-CM

## 2022-02-05 DIAGNOSIS — I10 Essential (primary) hypertension: Secondary | ICD-10-CM

## 2022-02-05 DIAGNOSIS — R300 Dysuria: Secondary | ICD-10-CM

## 2022-02-05 DIAGNOSIS — M25561 Pain in right knee: Secondary | ICD-10-CM | POA: Diagnosis not present

## 2022-02-05 DIAGNOSIS — F17218 Nicotine dependence, cigarettes, with other nicotine-induced disorders: Secondary | ICD-10-CM

## 2022-02-05 DIAGNOSIS — R296 Repeated falls: Secondary | ICD-10-CM

## 2022-02-05 DIAGNOSIS — M25552 Pain in left hip: Secondary | ICD-10-CM | POA: Diagnosis not present

## 2022-02-05 DIAGNOSIS — G8929 Other chronic pain: Secondary | ICD-10-CM

## 2022-02-05 DIAGNOSIS — R946 Abnormal results of thyroid function studies: Secondary | ICD-10-CM | POA: Diagnosis not present

## 2022-02-05 DIAGNOSIS — R5383 Other fatigue: Secondary | ICD-10-CM

## 2022-02-05 DIAGNOSIS — Z23 Encounter for immunization: Secondary | ICD-10-CM | POA: Diagnosis not present

## 2022-02-05 DIAGNOSIS — Z0001 Encounter for general adult medical examination with abnormal findings: Secondary | ICD-10-CM | POA: Diagnosis not present

## 2022-02-05 DIAGNOSIS — R2689 Other abnormalities of gait and mobility: Secondary | ICD-10-CM

## 2022-02-05 DIAGNOSIS — E782 Mixed hyperlipidemia: Secondary | ICD-10-CM

## 2022-02-05 DIAGNOSIS — M25562 Pain in left knee: Secondary | ICD-10-CM

## 2022-02-05 NOTE — Progress Notes (Signed)
[facility] [place], [place] [address]  Internal MEDICINE  Office Visit Note  Patient Name: Sarah Barnes  [id number]  [id number]  Date of Service: 02/09/2022  Chief Complaint  Patient presents with   Medicare Wellness   Depression   Gastroesophageal Reflux   Hypertension   Hyperlipidemia     HPI Pt is here for routine health maintenance examination -Still has left sided hip pain, no longer in therapy anymore, didn't really help -fell twice this week but landed on buttocks and did not hit head -Continues to have balance concerns. Discussed ordering walker to help with stability and reduce fall risk -declines mammogram, recently had colonoscopy and is due to repeat in 3 years, bone density done last year -smoking 1ppd, smoking since she was 17, has been between 1/2-full ppd since then. Discussed ordering CT chest for lung cancer screening -Had home visit from provider with insurance and A1c at home visit was 4.9 -Due for routine fasting labs -Followed by cardiology  Current Medication: Outpatient Encounter Medications as of 02/05/2022  Medication Sig   apixaban (ELIQUIS) 5 MG TABS tablet TAKE 1 TABLET BY MOUTH  TWICE DAILY   atorvastatin (LIPITOR) 40 MG tablet TAKE 1 TABLET BY MOUTH DAILY   buPROPion (WELLBUTRIN XL) 150 MG 24 hr tablet TAKE 1 TABLET BY MOUTH  DAILY   busPIRone (BUSPAR) 15 MG tablet TAKE 1 TABLET BY MOUTH  TWICE DAILY   carvedilol (COREG) 12.5 MG tablet Take 1.5 tablets (18.75 mg total) by mouth 2 (two) times daily.   celecoxib (CELEBREX) 100 MG capsule TAKE 1 CAPSULE BY MOUTH  DAILY   cilostazol (PLETAL) 50 MG tablet TAKE 1 TABLET BY MOUTH  TWICE DAILY   clopidogrel (PLAVIX) 75 MG tablet TAKE 1 TABLET BY MOUTH  DAILY   escitalopram (LEXAPRO) 10 MG tablet TAKE 1 TABLET BY MOUTH  DAILY   fenofibrate micronized (LOFIBRA) 134 MG capsule Take 1 capsule (134 mg total) by mouth daily before breakfast. NEEDS APPOINTMENT FOR FUTURE  REFILLS   folic acid (FOLVITE) 1 MG tablet Take 1 tablet (1 mg total) by mouth daily.   furosemide (LASIX) 40 MG tablet TAKE 1 TABLET BY MOUTH  DAILY   gabapentin (NEURONTIN) 100 MG capsule TAKE 2 CAPSULES BY MOUTH IN THE MORNING , 1 CAPSULE IN  THE AFTERNOON AND 2  CAPSULES AT NIGHT   hydrOXYzine (VISTARIL) 25 MG capsule TAKE 1 CAPSULE BY MOUTH EVERY 8  HOURS AS NEEDED   ibandronate (BONIVA) 150 MG tablet TAKE 1 TABLET BY MOUTH  MONTHLY WITH 8 OZ OF PLAIN  WATER 30 MINUTES BEFORE  FIRST FOOD, DRINK OR MEDS.  STAY UPRIGHT FOR 30 MINS   meclizine (ANTIVERT) 25 MG tablet Take 1 tablet (25 mg total) by mouth 3 (three) times daily as needed for dizziness.   Multiple Vitamin (MULTIVITAMIN WITH MINERALS) TABS tablet Take 1 tablet by mouth daily.   pantoprazole (PROTONIX) 40 MG tablet TAKE 1 TABLET BY MOUTH  DAILY   Potassium 99 MG TABS Take by mouth.   predniSONE (DELTASONE) 10 MG tablet Take 1 tablet (10 mg total) by mouth daily with breakfast. take one tab 3 X day for 3 days, then take one tab 2 X a day for 3 days and then take one tab a day for 3 days   tiZANidine (ZANAFLEX) 2 MG tablet TAKE 1 TABLET BY MOUTH  EVERY 8 HOURS AS NEEDED FOR MUSCLE SPASM(S)   traMADol (ULTRAM) 50 MG tablet Take 1  tablet (50 mg total) by mouth every 12 (twelve) hours as needed.   traZODone (DESYREL) 100 MG tablet TAKE 1 TABLET BY MOUTH  DAILY AT BEDTIME   valsartan (DIOVAN) 160 MG tablet TAKE 1 TABLET BY MOUTH  DAILY   [DISCONTINUED] azithromycin (ZITHROMAX) 250 MG tablet Take 1 tablet (250 mg total) by mouth daily. Take 1 tablet by mouth daily for 10 days   No facility-administered encounter medications on file as of 02/05/2022.    Surgical History: Past Surgical History:  Procedure Laterality Date   ANAL FISTULECTOMY  1970s X 3   BACK SURGERY     Cardiac MRI     Hypertrophic cardiomyopathy with septal thickness of 19 mm. EF 72%, moderate MR. . Moderate LAD. Partial fusion of right and left cusps of the aortic  valve- no stenosis. LVOT turbulence , transient SAM   COLONOSCOPY WITH PROPOFOL N/A 04/24/2021   Procedure: COLONOSCOPY WITH PROPOFOL;  Surgeon: [name], MD;  Location: [facility] ENDOSCOPY;  Service: Gastroenterology;  Laterality: N/A;   COLONOSCOPY WITH PROPOFOL N/A 06/15/2021   Procedure: COLONOSCOPY WITH PROPOFOL;  Surgeon: [name], MD;  Location: [facility] ENDOSCOPY;  Service: Gastroenterology;  Laterality: N/A;   FRACTURE SURGERY     HERNIA REPAIR     KNEE ARTHROSCOPY Right    LAPAROSCOPIC CHOLECYSTECTOMY     LAPAROSCOPIC INCISIONAL / UMBILICAL / VENTRAL HERNIA REPAIR  08/13/2016   [place] w/mesh   LOWER EXTREMITY ANGIOGRAPHY Left 09/29/2020   Procedure: LOWER EXTREMITY ANGIOGRAPHY;  Surgeon: Algernon Huxley, MD;  Location: [place] CV LAB;  Service: Cardiovascular;  Laterality: Left;   LOWER EXTREMITY ANGIOGRAPHY Right 10/06/2020   Procedure: LOWER EXTREMITY ANGIOGRAPHY;  Surgeon: Algernon Huxley, MD;  Location: [place] CV LAB;  Service: Cardiovascular;  Laterality: Right;   LUMBAR DISC SURGERY     "herniated disc"   NASAL FRACTURE SURGERY  1970s X 2   RIGHT/LEFT HEART CATH AND CORONARY ANGIOGRAPHY N/A 12/27/2016   Procedure: RIGHT/LEFT HEART CATH AND CORONARY ANGIOGRAPHY;  Surgeon: Leonie Man, MD;  Location: [place] INVASIVE CV LAB: Mild-moderate pulmonary hypertension.  Hyperdynamic ventricle noted.  EF 55-65% -hyperdynamic (unable to measure LVOT gradient).  Mildly elevated very tortuous but angiographically normal coronary arteries, suggesting hypertensive heart disease   SHOULDER ARTHROSCOPY WITH ROTATOR CUFF REPAIR Right 1990s?   TONSILLECTOMY  1951   TRANSTHORACIC ECHOCARDIOGRAM  01/05/2017    EF 60-65% with dynamic outflow tract obstruction at rest. Peak gradient 52 mmHg consistent with HOCM.  GRII DD area. Aortic sclerosis but no stenosis. Systolic anterior motion of mitral valve chordae. Mild mitral stenosis. Moderate LA dilation and mild RA dilation. Peak PA pressures 35 mmHg.    TRANSTHORACIC ECHOCARDIOGRAM  01/08/2020   [place]: EF 55 to 60%.  GR 1 DD.  Nodularity calcification.  Trivial AS/AR.  Mild MR   VENTRAL HERNIA REPAIR N/A 08/13/2016   Procedure: LAPAROSCOPIC VENTRAL HERNIA REPAIR WITH MESH;  Surgeon: Judeth Horn, MD;  Location: [place];  Service: General;  Laterality: N/A;    Medical History: Past Medical History:  Diagnosis Date   Agoraphobia with panic disorder    Anemia    Arthritis    "all over" (08/13/2016)   Chronic bronchitis ([place])    COPD (chronic obstructive pulmonary disease) ([place])    Cryptogenic stroke ([place])    a. 06/2020 Head CT: low density caudate nucleus concerning for infarct; b. 07/2020 s/p MDT Linq; c. 11/2020 Finding of Afib on Linq.   Depression    ETOH abuse  GAD (generalized anxiety disorder)    GERD (gastroesophageal reflux disease)    Headache    "daily til I got glasses; now have headache once in awhile" (08/13/2016)   Hepatitis 1960   "isolated &  hospitalized for 2 weeks; don't know which kind of hepatitis"    Hernia, hiatal    High cholesterol    Hypertension    Hypertrophic obstructive cardiomyopathy ([place]) 12/2016   Echo showed increased LV wall thickness with grade 2 mmHg LVOT gradient and SAM along with GRII DD.  Consistent with HOCM.   Mild aortic stenosis    a. 01/2019 Echo: EF 55-60%. Mild AS; b. 12/2019 Echo: Nl EF. Mild diast dysfxn. Trace AS/AI/TR. Mild MR.   Nonobstructive CAD (coronary artery disease)    a. 12/2016 Cath: LM nl, LAD min irregs, LCX nl, OM1/2 min irregs, RCA nl, RPDA min irregs. EF 55-65%.   PAD (peripheral artery disease) ([place])    a. 09/29/2020 s/p PTA/DBA to L prox/mid SFA and stenting of L SFA; b. 10/06/2020 PTA of R TP trunk and prox peroenal. PTA/DBA of R SFA and prox R Popliteal. R SFA stenting x 2; c. 10/2020 ABI: R 0.92, L 0.93.   PAF (paroxysmal atrial fibrillation) ([place])    a.11/2020 PAF noted on Linq; b. CHA2DS2VASc = 6-->eliquis.   Pneumonia    "once or twice" (08/13/2016)   PONV  (postoperative nausea and vomiting)    Sciatica    Tobacco abuse     Family History: Family History  Problem Relation Age of Onset   Diabetes Mother    Hyperlipidemia Mother    Hypertension Mother    Heart attack Mother 51   Angina Mother        chronic problem   Hypertension Father    Stroke Father    Cancer Brother       Review of Systems  Constitutional:  Positive for fatigue. Negative for chills and unexpected weight change.  HENT:  Negative for congestion, postnasal drip, rhinorrhea, sneezing and sore throat.   Eyes:  Negative for redness.  Respiratory:  Negative for cough, chest tightness and shortness of breath.   Cardiovascular:  Negative for chest pain, palpitations and leg swelling.  Gastrointestinal:  Negative for abdominal pain, constipation, diarrhea, nausea and vomiting.  Genitourinary:  Negative for dysuria and frequency.  Musculoskeletal:  Positive for arthralgias and myalgias. Negative for back pain, joint swelling and neck pain.       Frequent falls, unsteady   Skin:  Negative for rash.  Neurological:  Positive for weakness. Negative for tremors and numbness.  Hematological:  Negative for adenopathy. Does not bruise/bleed easily.  Psychiatric/Behavioral:  Positive for sleep disturbance. Negative for behavioral problems (Depression) and suicidal ideas. The patient is nervous/anxious.      Vital Signs: BP 130/80 Comment: 144/73  Pulse (!) 58   Temp 98.4 F (36.9 C)   Resp 16   Ht '5\' 3"'$  (1.6 m)   Wt 174 lb 12.8 oz (79.3 kg)   SpO2 95%   BMI 30.96 kg/m    Physical Exam Vitals and nursing note reviewed.  Constitutional:      General: She is not in acute distress.    Appearance: Normal appearance. She is obese. She is not ill-appearing.  HENT:     Head: Normocephalic and atraumatic.  Eyes:     Pupils: Pupils are equal, round, and reactive to light.  Cardiovascular:     Rate and Rhythm: Normal rate and regular rhythm.  Pulmonary:     Effort:  Pulmonary effort is normal. No respiratory distress.  Chest:     Chest wall: No tenderness.  Breasts:    Right: Normal. No mass.     Left: Normal. No mass.  Abdominal:     General: Abdomen is flat.     Tenderness: There is no abdominal tenderness.  Musculoskeletal:        General: Normal range of motion.  Skin:    General: Skin is warm and dry.  Neurological:     Mental Status: She is alert and oriented to person, place, and time.     Cranial Nerves: No cranial nerve deficit.     Coordination: Coordination normal.     Gait: Gait abnormal.  Psychiatric:        Mood and Affect: Mood normal.        Behavior: Behavior normal.      LABS: Recent Results (from the past 2160 hour(s))  CUP PACEART REMOTE DEVICE CHECK     Status: None   Collection Time: 12/11/21 12:39 PM  Result Value Ref Range   Pulse Generator Manufacturer MERM    Date Time Interrogation Session [phone]    Pulse Gen Model LNQ22 LINQ II    Pulse Gen Serial Number [id number] [place] [facility] Name [place] Pulse Generator Type ICM/ILR    Implantable Pulse Generator Implant Date [id number]   UA/M w/rflx Culture, Routine     Status: Abnormal   Collection Time: 02/05/22  3:48 PM   Specimen: Urine   Urine  Result Value Ref Range   Specific Gravity, UA 1.017 1.005 - 1.030   pH, UA 5.5 5.0 - 7.5   Color, UA Yellow Yellow   Appearance Ur Clear Clear   Leukocytes,UA 1+ (A) Negative   Protein,UA Negative Negative/Trace   Glucose, UA Negative Negative   Ketones, UA Trace (A) Negative   RBC, UA 1+ (A) Negative   Bilirubin, UA Negative Negative   Urobilinogen, Ur 0.2 0.2 - 1.0 mg/dL   Nitrite, UA Negative Negative   Microscopic Examination See below:     Comment: Microscopic was indicated and was performed.   Urinalysis Reflex Comment     Comment: This specimen has reflexed to a Urine Culture.  Microscopic Examination     Status: Abnormal   Collection Time: 02/05/22  3:48 PM   Urine  Result  Value Ref Range   WBC, UA 6-10 (A) 0 - 5 /hpf   RBC, Urine 3-10 (A) 0 - 2 /hpf   Epithelial Cells (non renal) 0-10 0 - 10 /hpf   Casts None seen None seen /lpf   Bacteria, UA Few None seen/Few  Urine Culture, Reflex     Status: None   Collection Time: 02/05/22  3:48 PM   Urine  Result Value Ref Range   Urine Culture, Routine Final report    Organism ID, Bacteria Comment     Comment: Mixed urogenital flora Less than 10,000 colonies/mL   CUP PACEART REMOTE DEVICE CHECK     Status: None   Collection Time: 02/07/22  8:05 AM  Result Value Ref Range   Pulse Generator Manufacturer MERM    Date Time Interrogation Session [id number]    Pulse Gen Model LNQ22 LINQ II    Pulse Gen Serial Number [id number]    Clinic Name [place] Pulse Generator Type ICM/ILR    Implantable Pulse Generator Implant Date [id number]  Assessment/Plan: 1. Encounter for general adult medical examination with abnormal findings CPE performed, routine labs ordered, declines mammogram, UTD on colonoscopy  2. Primary hypertension Stable, continue current medications  3. Left hip pain Reports continues left hip pain and weakness without much improvement during PT. Will refer to ortho - AMB referral to orthopedics  4. Chronic pain of both knees - AMB referral to orthopedics  5. Cigarette nicotine dependence with other nicotine-induced disorder - CT CHEST LUNG CA SCREEN LOW DOSE W/O CM; Future  6. SOB (shortness of breath) - CT CHEST LUNG CA SCREEN LOW DOSE W/O CM; Future  7. Balance problem Due to balance concerns and fall risk will order rollator - For home use only DME 4 wheeled rolling walker with seat ([id number])  8. Vitamin D deficiency - VITAMIN D 25 Hydroxy (Vit-D Deficiency, Fractures)  9. Frequent falls - For home use only DME 4 wheeled rolling walker with seat ([id number])  10. Mixed hyperlipidemia Continue lipitor and fenofibrate and will update labs - Lipid  Panel With LDL/HDL Ratio  11. B12 deficiency - B12 and Folate Panel  12. Abnormal thyroid exam - TSH + free T4  13. Other fatigue - CBC w/Diff/Platelet - Comprehensive metabolic panel - Lipid Panel With LDL/HDL Ratio - TSH + free T4 - VITAMIN D 25 Hydroxy (Vit-D Deficiency, Fractures) - B12 and Folate Panel  15. Flu vaccine need - Flu Vaccine MDCK QUAD PF  16. Dysuria - UA/M w/rflx Culture, Routine - Microscopic Examination - Urine Culture, Reflex   General Counseling: Renny verbalizes understanding of the findings of todays visit and agrees with plan of treatment. I have discussed any further diagnostic evaluation that may be needed or ordered today. We also reviewed her medications today. she has been encouraged to call the office with any questions or concerns that should arise related to todays visit.    Counseling:    Orders Placed This Encounter  Procedures   For home use only DME 4 wheeled rolling walker with seat ([id number])   Microscopic Examination   Urine Culture, Reflex   CT CHEST LUNG CA SCREEN LOW DOSE W/O CM   Flu Vaccine MDCK QUAD PF   UA/M w/rflx Culture, Routine   CBC w/Diff/Platelet   Comprehensive metabolic panel   Lipid Panel With LDL/HDL Ratio   TSH + free T4   VITAMIN D 25 Hydroxy (Vit-D Deficiency, Fractures)   B12 and Folate Panel   AMB referral to orthopedics    No orders of the defined types were placed in this encounter.   This patient was seen by Drema Dallas, PA-C in collaboration with Dr. Clayborn Bigness as a part of collaborative care agreement.  Total time spent:35 Minutes  Time spent includes review of chart, medications, test results, and follow up plan with the patient.     [name], MD  Internal Medicine

## 2022-02-05 NOTE — Telephone Encounter (Signed)
Spoke with sarah from [place] [facility] patient for Rolator walker with seat also send her community message through epic that order is in the epic

## 2022-02-06 ENCOUNTER — Telehealth: Payer: Self-pay | Admitting: Physician Assistant

## 2022-02-06 NOTE — Telephone Encounter (Signed)
Awaiting 02/05/22 office notes for Orthopedic referral-Toni

## 2022-02-07 ENCOUNTER — Ambulatory Visit (INDEPENDENT_AMBULATORY_CARE_PROVIDER_SITE_OTHER): Payer: Medicare Other

## 2022-02-07 DIAGNOSIS — I639 Cerebral infarction, unspecified: Secondary | ICD-10-CM

## 2022-02-08 DIAGNOSIS — R262 Difficulty in walking, not elsewhere classified: Secondary | ICD-10-CM | POA: Diagnosis not present

## 2022-02-08 LAB — UA/M W/RFLX CULTURE, ROUTINE
Bilirubin, UA: NEGATIVE
Glucose, UA: NEGATIVE
Nitrite, UA: NEGATIVE
Protein,UA: NEGATIVE
Specific Gravity, UA: 1.017 (ref 1.005–1.030)
Urobilinogen, Ur: 0.2 mg/dL (ref 0.2–1.0)
pH, UA: 5.5 (ref 5.0–7.5)

## 2022-02-08 LAB — CUP PACEART REMOTE DEVICE CHECK
Date Time Interrogation Session: 20230920080512
Implantable Pulse Generator Implant Date: 20220330

## 2022-02-08 LAB — MICROSCOPIC EXAMINATION: Casts: NONE SEEN /lpf

## 2022-02-08 LAB — URINE CULTURE, REFLEX

## 2022-02-14 ENCOUNTER — Telehealth: Payer: Self-pay | Admitting: Physician Assistant

## 2022-02-14 NOTE — Telephone Encounter (Signed)
Orthopedic referral sent via [facility] to [facility]

## 2022-02-15 ENCOUNTER — Telehealth: Payer: Self-pay | Admitting: Physician Assistant

## 2022-02-15 NOTE — Telephone Encounter (Signed)
Lung screening CT faxed to [facility] [phone]

## 2022-02-19 NOTE — Progress Notes (Signed)
Carelink Summary Report / Loop Recorder 

## 2022-02-20 ENCOUNTER — Telehealth: Payer: Self-pay | Admitting: Physician Assistant

## 2022-02-20 NOTE — Telephone Encounter (Signed)
Orthopedic appointment 02/26/22 @ [facility]

## 2022-02-26 DIAGNOSIS — G8929 Other chronic pain: Secondary | ICD-10-CM | POA: Diagnosis not present

## 2022-02-26 DIAGNOSIS — M25551 Pain in right hip: Secondary | ICD-10-CM | POA: Diagnosis not present

## 2022-02-26 DIAGNOSIS — M5137 Other intervertebral disc degeneration, lumbosacral region: Secondary | ICD-10-CM | POA: Diagnosis not present

## 2022-02-26 DIAGNOSIS — M25552 Pain in left hip: Secondary | ICD-10-CM | POA: Diagnosis not present

## 2022-02-27 ENCOUNTER — Telehealth: Payer: Self-pay | Admitting: Internal Medicine

## 2022-02-27 DIAGNOSIS — F17210 Nicotine dependence, cigarettes, uncomplicated: Secondary | ICD-10-CM | POA: Diagnosis not present

## 2022-02-27 NOTE — Telephone Encounter (Signed)
Lvm to schedule appointment with [facility]oni 

## 2022-03-05 ENCOUNTER — Ambulatory Visit (INDEPENDENT_AMBULATORY_CARE_PROVIDER_SITE_OTHER): Payer: Medicare Other | Admitting: Internal Medicine

## 2022-03-05 ENCOUNTER — Telehealth: Payer: Self-pay | Admitting: Internal Medicine

## 2022-03-05 ENCOUNTER — Telehealth: Payer: Self-pay

## 2022-03-05 ENCOUNTER — Encounter: Payer: Self-pay | Admitting: Internal Medicine

## 2022-03-05 VITALS — BP 161/76 | HR 58 | Temp 98.4°F | Ht 63.0 in | Wt 183.2 lb

## 2022-03-05 DIAGNOSIS — G8929 Other chronic pain: Secondary | ICD-10-CM | POA: Diagnosis not present

## 2022-03-05 DIAGNOSIS — G4733 Obstructive sleep apnea (adult) (pediatric): Secondary | ICD-10-CM

## 2022-03-05 DIAGNOSIS — M25561 Pain in right knee: Secondary | ICD-10-CM

## 2022-03-05 DIAGNOSIS — G4734 Idiopathic sleep related nonobstructive alveolar hypoventilation: Secondary | ICD-10-CM | POA: Diagnosis not present

## 2022-03-05 DIAGNOSIS — R0602 Shortness of breath: Secondary | ICD-10-CM

## 2022-03-05 DIAGNOSIS — R911 Solitary pulmonary nodule: Secondary | ICD-10-CM

## 2022-03-05 DIAGNOSIS — M25562 Pain in left knee: Secondary | ICD-10-CM | POA: Diagnosis not present

## 2022-03-05 DIAGNOSIS — F17218 Nicotine dependence, cigarettes, with other nicotine-induced disorders: Secondary | ICD-10-CM | POA: Diagnosis not present

## 2022-03-05 NOTE — Telephone Encounter (Signed)
Clinicals faxed to [facility] [employer] for PET authorization-Toni

## 2022-03-05 NOTE — Progress Notes (Signed)
[facility] [place], [place] [address]  Pulmonary Sleep Medicine   Office Visit Note  Patient Name: Sarah Barnes DOB: [birth year] MRN [id number]  Date of Service: 03/05/2022  Complaints/HPI: Pulmonary nodule noted on CT scan from 02/27/22. Previous scan did not show any nodule. She states she did smoke up until first of October. Patient states that she has had SOB cough and some sputum. She denies having chest pain. She has no weight loss. She states she does have a history of CHF. She is seen by cardiology for this. She sleeps like a rock. She states that she has no bed partner. Not sure if she snores. She states that she wakes tired and stays tired all the time. She has had a sleep study and she has sleep apnea. Her AHI was 23.7 but she states that she is not able to tolerate the CPAP. She is also not on oxygen. Her lowest SaO2 on the study was 85%  ROS  General: (-) fever, (-) chills, (-) night sweats, (-) weakness Skin: (-) rashes, (-) itching,. Eyes: (-) visual changes, (-) redness, (-) itching. Nose and Sinuses: (-) nasal stuffiness or itchiness, (-) postnasal drip, (-) nosebleeds, (-) sinus trouble. Mouth and Throat: (-) sore throat, (-) hoarseness. Neck: (-) swollen glands, (-) enlarged thyroid, (-) neck pain. Respiratory: + cough, (-) bloody sputum, + shortness of breath, + wheezing. Cardiovascular: - ankle swelling, (-) chest pain. Lymphatic: (-) lymph node enlargement. Neurologic: (-) numbness, (-) tingling. Psychiatric: (-) anxiety, (-) depression   Current Medication: Outpatient Encounter Medications as of 03/05/2022  Medication Sig   apixaban (ELIQUIS) 5 MG TABS tablet TAKE 1 TABLET BY MOUTH  TWICE DAILY   atorvastatin (LIPITOR) 40 MG tablet TAKE 1 TABLET BY MOUTH DAILY   buPROPion (WELLBUTRIN XL) 150 MG 24 hr tablet TAKE 1 TABLET BY MOUTH  DAILY   busPIRone (BUSPAR) 15 MG tablet TAKE 1 TABLET BY MOUTH  TWICE DAILY   carvedilol (COREG)  12.5 MG tablet Take 1.5 tablets (18.75 mg total) by mouth 2 (two) times daily.   celecoxib (CELEBREX) 100 MG capsule TAKE 1 CAPSULE BY MOUTH  DAILY   cilostazol (PLETAL) 50 MG tablet TAKE 1 TABLET BY MOUTH  TWICE DAILY   clopidogrel (PLAVIX) 75 MG tablet TAKE 1 TABLET BY MOUTH  DAILY   escitalopram (LEXAPRO) 10 MG tablet TAKE 1 TABLET BY MOUTH  DAILY   fenofibrate micronized (LOFIBRA) 134 MG capsule Take 1 capsule (134 mg total) by mouth daily before breakfast. NEEDS APPOINTMENT FOR FUTURE REFILLS   folic acid (FOLVITE) 1 MG tablet Take 1 tablet (1 mg total) by mouth daily.   furosemide (LASIX) 40 MG tablet TAKE 1 TABLET BY MOUTH  DAILY   gabapentin (NEURONTIN) 100 MG capsule TAKE 2 CAPSULES BY MOUTH IN THE MORNING , 1 CAPSULE IN  THE AFTERNOON AND 2  CAPSULES AT NIGHT   hydrOXYzine (VISTARIL) 25 MG capsule TAKE 1 CAPSULE BY MOUTH EVERY 8  HOURS AS NEEDED   ibandronate (BONIVA) 150 MG tablet TAKE 1 TABLET BY MOUTH  MONTHLY WITH 8 OZ OF PLAIN  WATER 30 MINUTES BEFORE  FIRST FOOD, DRINK OR MEDS.  STAY UPRIGHT FOR 30 MINS   meclizine (ANTIVERT) 25 MG tablet Take 1 tablet (25 mg total) by mouth 3 (three) times daily as needed for dizziness.   Multiple Vitamin (MULTIVITAMIN WITH MINERALS) TABS tablet Take 1 tablet by mouth daily.   pantoprazole (PROTONIX) 40 MG tablet TAKE 1 TABLET BY  MOUTH  DAILY   Potassium 99 MG TABS Take by mouth.   predniSONE (DELTASONE) 10 MG tablet Take 1 tablet (10 mg total) by mouth daily with breakfast. take one tab 3 X day for 3 days, then take one tab 2 X a day for 3 days and then take one tab a day for 3 days   tiZANidine (ZANAFLEX) 2 MG tablet TAKE 1 TABLET BY MOUTH  EVERY 8 HOURS AS NEEDED FOR MUSCLE SPASM(S)   traMADol (ULTRAM) 50 MG tablet Take 1 tablet (50 mg total) by mouth every 12 (twelve) hours as needed.   traZODone (DESYREL) 100 MG tablet TAKE 1 TABLET BY MOUTH  DAILY AT BEDTIME   valsartan (DIOVAN) 160 MG tablet TAKE 1 TABLET BY MOUTH  DAILY   No  facility-administered encounter medications on file as of 03/05/2022.    Surgical History: Past Surgical History:  Procedure Laterality Date   ANAL FISTULECTOMY  1970s X 3   BACK SURGERY     Cardiac MRI     Hypertrophic cardiomyopathy with septal thickness of 19 mm. EF 72%, moderate MR. . Moderate LAD. Partial fusion of right and left cusps of the aortic valve- no stenosis. LVOT turbulence , transient SAM   COLONOSCOPY WITH PROPOFOL N/A 04/24/2021   Procedure: COLONOSCOPY WITH PROPOFOL;  Surgeon: [name], MD;  Location: [facility] ENDOSCOPY;  Service: Gastroenterology;  Laterality: N/A;   COLONOSCOPY WITH PROPOFOL N/A 06/15/2021   Procedure: COLONOSCOPY WITH PROPOFOL;  Surgeon: [name], MD;  Location: [facility] ENDOSCOPY;  Service: Gastroenterology;  Laterality: N/A;   FRACTURE SURGERY     HERNIA REPAIR     KNEE ARTHROSCOPY Right    LAPAROSCOPIC CHOLECYSTECTOMY     LAPAROSCOPIC INCISIONAL / UMBILICAL / VENTRAL HERNIA REPAIR  08/13/2016   [place] w/mesh   LOWER EXTREMITY ANGIOGRAPHY Left 09/29/2020   Procedure: LOWER EXTREMITY ANGIOGRAPHY;  Surgeon: Algernon Huxley, MD;  Location: [place] CV LAB;  Service: Cardiovascular;  Laterality: Left;   LOWER EXTREMITY ANGIOGRAPHY Right 10/06/2020   Procedure: LOWER EXTREMITY ANGIOGRAPHY;  Surgeon: Algernon Huxley, MD;  Location: [place] CV LAB;  Service: Cardiovascular;  Laterality: Right;   LUMBAR DISC SURGERY     "herniated disc"   NASAL FRACTURE SURGERY  1970s X 2   RIGHT/LEFT HEART CATH AND CORONARY ANGIOGRAPHY N/A 12/27/2016   Procedure: RIGHT/LEFT HEART CATH AND CORONARY ANGIOGRAPHY;  Surgeon: Leonie Man, MD;  Location: [place] INVASIVE CV LAB: Mild-moderate pulmonary hypertension.  Hyperdynamic ventricle noted.  EF 55-65% -hyperdynamic (unable to measure LVOT gradient).  Mildly elevated very tortuous but angiographically normal coronary arteries, suggesting hypertensive heart disease   SHOULDER ARTHROSCOPY WITH ROTATOR CUFF REPAIR Right  1990s?   TONSILLECTOMY  1951   TRANSTHORACIC ECHOCARDIOGRAM  01/05/2017    EF 60-65% with dynamic outflow tract obstruction at rest. Peak gradient 52 mmHg consistent with HOCM.  GRII DD area. Aortic sclerosis but no stenosis. Systolic anterior motion of mitral valve chordae. Mild mitral stenosis. Moderate LA dilation and mild RA dilation. Peak PA pressures 35 mmHg.   TRANSTHORACIC ECHOCARDIOGRAM  01/08/2020   NOVA: EF 55 to 60%.  GR 1 DD.  Nodularity calcification.  Trivial AS/AR.  Mild MR   VENTRAL HERNIA REPAIR N/A 08/13/2016   Procedure: LAPAROSCOPIC VENTRAL HERNIA REPAIR WITH MESH;  Surgeon: Judeth Horn, MD;  Location: [facility];  Service: General;  Laterality: N/A;    Medical History: Past Medical History:  Diagnosis Date   Agoraphobia with panic disorder    Anemia  Arthritis    "all over" (08/13/2016)   Chronic bronchitis (HCC)    COPD (chronic obstructive pulmonary disease) ([place])    Cryptogenic stroke ([place])    a. 06/2020 Head CT: low density caudate nucleus concerning for infarct; b. 07/2020 s/p MDT Linq; c. 11/2020 Finding of Afib on Linq.   Depression    ETOH abuse    GAD (generalized anxiety disorder)    GERD (gastroesophageal reflux disease)    Headache    "daily til I got glasses; now have headache once in awhile" (08/13/2016)   Hepatitis 1960   "isolated &  hospitalized for 2 weeks; don't know which kind of hepatitis"    Hernia, hiatal    High cholesterol    Hypertension    Hypertrophic obstructive cardiomyopathy ([place]) 12/2016   Echo showed increased LV wall thickness with grade 2 mmHg LVOT gradient and SAM along with GRII DD.  Consistent with HOCM.   Mild aortic stenosis    a. 01/2019 Echo: EF 55-60%. Mild AS; b. 12/2019 Echo: Nl EF. Mild diast dysfxn. Trace AS/AI/TR. Mild MR.   Nonobstructive CAD (coronary artery disease)    a. 12/2016 Cath: LM nl, LAD min irregs, LCX nl, OM1/2 min irregs, RCA nl, RPDA min irregs. EF 55-65%.   PAD (peripheral artery disease) ([place])    a.  09/29/2020 s/p PTA/DBA to L prox/mid SFA and stenting of L SFA; b. 10/06/2020 PTA of R TP trunk and prox peroenal. PTA/DBA of R SFA and prox R Popliteal. R SFA stenting x 2; c. 10/2020 ABI: R 0.92, L 0.93.   PAF (paroxysmal atrial fibrillation) ([place])    a.11/2020 PAF noted on Linq; b. CHA2DS2VASc = 6-->eliquis.   Pneumonia    "once or twice" (08/13/2016)   PONV (postoperative nausea and vomiting)    Sciatica    Tobacco abuse     Family History: Family History  Problem Relation Age of Onset   Diabetes Mother    Hyperlipidemia Mother    Hypertension Mother    Heart attack Mother 69   Angina Mother        chronic problem   Hypertension Father    Stroke Father    Cancer Brother     Social History: Social History   Socioeconomic History   Marital status: Single    Spouse name: Not on file   Number of children: Not on file   Years of education: Not on file   Highest education level: Not on file  Occupational History   Not on file  Tobacco Use   Smoking status: Every Day    Packs/day: 1.00    Years: 49.00    Total pack years: 49.00    Types: Cigarettes   Smokeless tobacco: Former   Tobacco comments:    Quit on February 18, 2022.  Vaping Use   Vaping Use: Former  Substance and Sexual Activity   Alcohol use: Not Currently    Alcohol/week: 84.0 standard drinks of alcohol    Types: 36 Cans of beer, 48 Shots of liquor per week    Comment: occ   Drug use: Not Currently    Frequency: 1.0 times per week    Types: Marijuana, Cocaine    Comment: Cocaine + March 2017   Sexual activity: Not Currently  Other Topics Concern   Not on file  Social History Narrative   [place]ell has generalized anxiety disorder and clearcut Agoraphobia.   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    She currently lives with her sister.   Currently denies substance abuse beyond Tobacco (1-1/2 PPD) & EtOH (beer,  vodka) - not ready to discuss quitting.   Social Determinants of Health   Financial Resource Strain: Not on file  Food Insecurity: Not on file  Transportation Needs: Not on file  Physical Activity: Not on file  Stress: Not on file  Social Connections: Not on file  Intimate Partner Violence: Not on file    Vital Signs: Blood pressure (!) 161/76, pulse (!) 58, temperature 98.4 F (36.9 C), height '5\' 3"'$  (1.6 m), weight 183 lb 3.2 oz (83.1 kg), SpO2 96 %.  Examination: General Appearance: The patient is well-developed, well-nourished, and in no distress. Skin: Gross inspection of skin unremarkable. Head: normocephalic, no gross deformities. Eyes: no gross deformities noted. ENT: ears appear grossly normal no exudates. Neck: Supple. No thyromegaly. No LAD. Respiratory: no rhonchi noted. Cardiovascular: Normal S1 and S2 without murmur or rub. Extremities: No cyanosis. pulses are equal. Neurologic: Alert and oriented. No involuntary movements.  LABS: Recent Results (from the past 2160 hour(s))  CUP PACEART REMOTE DEVICE CHECK     Status: None   Collection Time: 12/11/21 12:39 PM  Result Value Ref Range   Pulse Generator Manufacturer MERM    Date Time Interrogation Session [phone]    Pulse Gen Model [id number] Lurline Del    Pulse Gen Serial Number [id number]    Clinic Name [facility] Pulse Generator Type ICM/ILR    Implantable Pulse Generator Implant Date [id number]   UA/M w/rflx Culture, Routine     Status: Abnormal   Collection Time: 02/05/22  3:48 PM   Specimen: Urine   Urine  Result Value Ref Range   Specific Gravity, UA 1.017 1.005 - 1.030   pH, UA 5.5 5.0 - 7.5   Color, UA Yellow Yellow   Appearance Ur Clear Clear   Leukocytes,UA 1+ (A) Negative   Protein,UA Negative Negative/Trace   Glucose, UA Negative Negative   Ketones, UA Trace (A) Negative   RBC, UA 1+ (A) Negative   Bilirubin, UA Negative Negative   Urobilinogen, Ur 0.2 0.2 - 1.0 mg/dL    Nitrite, UA Negative Negative   Microscopic Examination See below:     Comment: Microscopic was indicated and was performed.   Urinalysis Reflex Comment     Comment: This specimen has reflexed to a Urine Culture.  Microscopic Examination     Status: Abnormal   Collection Time: 02/05/22  3:48 PM   Urine  Result Value Ref Range   WBC, UA 6-10 (A) 0 - 5 /hpf   RBC, Urine 3-10 (A) 0 - 2 /hpf   Epithelial Cells (non renal) 0-10 0 - 10 /hpf   Casts None seen None seen /lpf   Bacteria, UA Few None seen/Few  Urine Culture, Reflex     Status: None   Collection Time: 02/05/22  3:48 PM   Urine  Result Value Ref Range   Urine Culture, Routine Final report    Organism ID, Bacteria Comment     Comment: Mixed urogenital flora Less than 10,000 colonies/mL   CUP PACEART REMOTE DEVICE CHECK     Status: None   Collection Time: 02/07/22  8:05 AM  Result Value Ref Range   Pulse Generator Manufacturer MERM    Date Time Interrogation Session [id number]    Pulse  Gen Model [id number] LINQ II    Pulse Gen Serial Number [id number]    Clinic Name [facility]    Implantable Pulse Generator Type ICM/ILR    Implantable Pulse Generator Implant Date [id number]     Radiology: DG Finger Index Left  Result Date: 06/29/2021 CLINICAL DATA:  Fall EXAM: LEFT INDEX FINGER 2+V COMPARISON:  None. FINDINGS: Avulsion fracture at the medial base of the second proximal phalanx, nondisplaced. Generalized osteopenia. Degenerative interphalangeal spurring. Study performed during epic downtime with prelim placed on images. IMPRESSION: Nondisplaced avulsion fracture at the base of the second proximal phalanx. Electronically Signed   By: Jorje Guild M.D.   On: 06/29/2021 07:10   CT HEAD WO CONTRAST (5MM)  Result Date: 06/29/2021 CLINICAL DATA:  Head trauma. EXAM: CT HEAD WITHOUT CONTRAST CT MAXILLOFACIAL WITHOUT CONTRAST CT CERVICAL SPINE WITHOUT CONTRAST TECHNIQUE: Multidetector CT imaging of the head, cervical spine, and  maxillofacial structures were performed using the standard protocol without intravenous contrast. Multiplanar CT image reconstructions of the cervical spine and maxillofacial structures were also generated. RADIATION DOSE REDUCTION: This exam was performed according to the departmental dose-optimization program which includes automated exposure control, adjustment of the mA and/or kV according to patient size and/or use of iterative reconstruction technique. COMPARISON:  Head CT 07/06/2020 FINDINGS: CT HEAD FINDINGS Brain: No evidence of acute infarction, hemorrhage, hydrocephalus, extra-axial collection or mass lesion/mass effect. Chronic appearing lacune is in the bilateral caudate and right corona radiata. Right caudate head lacune has occurred since prior but is well-defined. Cluster of calcifications along the left occipital convexity without associated swelling or encephalomalacia is seen, likely related to old insult. Area unremarkable by brain MRI 1 year ago. Vascular: Atheromatous calcification Skull: No acute fracture CT MAXILLOFACIAL FINDINGS Osseous: Pinched appearance of the nose, presumably for epistaxis. Associated congestion of nasal mucosa. No discrete and acute nasal bone fracture is seen. Negative for orbital or mandibular fracture. The temporomandibular joints are located. Orbits: No visible injury. Sinuses: Negative for hemosinus. Soft tissues: No hematoma or opaque foreign body. CT CERVICAL SPINE FINDINGS Alignment: No traumatic malalignment. Generalized straightening of the cervical spine Skull base and vertebrae: No acute fracture. No primary bone lesion or focal pathologic process. Soft tissues and spinal canal: No prevertebral fluid or swelling. No visible canal hematoma. Disc levels:  Ordinary degenerative changes. Upper chest: No evidence of injury IMPRESSION: 1. No evidence of acute intracranial or cervical spine injury. 2. No acute facial fracture. 3. Chronic small vessel ischemia with  chronic lacunes. A chronic right caudate lacunar infarct has occurred since CT 1 year ago. Electronically Signed   By: Jorje Guild M.D.   On: 06/29/2021 07:03   CT MAXILLOFACIAL WO CONTRAST  Result Date: 06/29/2021 CLINICAL DATA:  Head trauma. EXAM: CT HEAD WITHOUT CONTRAST CT MAXILLOFACIAL WITHOUT CONTRAST CT CERVICAL SPINE WITHOUT CONTRAST TECHNIQUE: Multidetector CT imaging of the head, cervical spine, and maxillofacial structures were performed using the standard protocol without intravenous contrast. Multiplanar CT image reconstructions of the cervical spine and maxillofacial structures were also generated. RADIATION DOSE REDUCTION: This exam was performed according to the departmental dose-optimization program which includes automated exposure control, adjustment of the mA and/or kV according to patient size and/or use of iterative reconstruction technique. COMPARISON:  Head CT 07/06/2020 FINDINGS: CT HEAD FINDINGS Brain: No evidence of acute infarction, hemorrhage, hydrocephalus, extra-axial collection or mass lesion/mass effect. Chronic appearing lacune is in the bilateral caudate and right corona radiata. Right caudate head lacune has occurred since prior but is  well-defined. Cluster of calcifications along the left occipital convexity without associated swelling or encephalomalacia is seen, likely related to old insult. Area unremarkable by brain MRI 1 year ago. Vascular: Atheromatous calcification Skull: No acute fracture CT MAXILLOFACIAL FINDINGS Osseous: Pinched appearance of the nose, presumably for epistaxis. Associated congestion of nasal mucosa. No discrete and acute nasal bone fracture is seen. Negative for orbital or mandibular fracture. The temporomandibular joints are located. Orbits: No visible injury. Sinuses: Negative for hemosinus. Soft tissues: No hematoma or opaque foreign body. CT CERVICAL SPINE FINDINGS Alignment: No traumatic malalignment. Generalized straightening of the  cervical spine Skull base and vertebrae: No acute fracture. No primary bone lesion or focal pathologic process. Soft tissues and spinal canal: No prevertebral fluid or swelling. No visible canal hematoma. Disc levels:  Ordinary degenerative changes. Upper chest: No evidence of injury IMPRESSION: 1. No evidence of acute intracranial or cervical spine injury. 2. No acute facial fracture. 3. Chronic small vessel ischemia with chronic lacunes. A chronic right caudate lacunar infarct has occurred since CT 1 year ago. Electronically Signed   By: Jorje Guild M.D.   On: 06/29/2021 07:03   CT CERVICAL SPINE WO CONTRAST  Result Date: 06/29/2021 CLINICAL DATA:  Head trauma. EXAM: CT HEAD WITHOUT CONTRAST CT MAXILLOFACIAL WITHOUT CONTRAST CT CERVICAL SPINE WITHOUT CONTRAST TECHNIQUE: Multidetector CT imaging of the head, cervical spine, and maxillofacial structures were performed using the standard protocol without intravenous contrast. Multiplanar CT image reconstructions of the cervical spine and maxillofacial structures were also generated. RADIATION DOSE REDUCTION: This exam was performed according to the departmental dose-optimization program which includes automated exposure control, adjustment of the mA and/or kV according to patient size and/or use of iterative reconstruction technique. COMPARISON:  Head CT 07/06/2020 FINDINGS: CT HEAD FINDINGS Brain: No evidence of acute infarction, hemorrhage, hydrocephalus, extra-axial collection or mass lesion/mass effect. Chronic appearing lacune is in the bilateral caudate and right corona radiata. Right caudate head lacune has occurred since prior but is well-defined. Cluster of calcifications along the left occipital convexity without associated swelling or encephalomalacia is seen, likely related to old insult. Area unremarkable by brain MRI 1 year ago. Vascular: Atheromatous calcification Skull: No acute fracture CT MAXILLOFACIAL FINDINGS Osseous: Pinched appearance of  the nose, presumably for epistaxis. Associated congestion of nasal mucosa. No discrete and acute nasal bone fracture is seen. Negative for orbital or mandibular fracture. The temporomandibular joints are located. Orbits: No visible injury. Sinuses: Negative for hemosinus. Soft tissues: No hematoma or opaque foreign body. CT CERVICAL SPINE FINDINGS Alignment: No traumatic malalignment. Generalized straightening of the cervical spine Skull base and vertebrae: No acute fracture. No primary bone lesion or focal pathologic process. Soft tissues and spinal canal: No prevertebral fluid or swelling. No visible canal hematoma. Disc levels:  Ordinary degenerative changes. Upper chest: No evidence of injury IMPRESSION: 1. No evidence of acute intracranial or cervical spine injury. 2. No acute facial fracture. 3. Chronic small vessel ischemia with chronic lacunes. A chronic right caudate lacunar infarct has occurred since CT 1 year ago. Electronically Signed   By: Jorje Guild M.D.   On: 06/29/2021 07:03    No results found.  CUP PACEART REMOTE DEVICE CHECK  Result Date: 02/08/2022 ILR summary report received. Battery status OK. Normal device function. No new symptom, tachy, or brady episodes. No new AF episodes. AF burden is 0% of the time.  There was one false pause episode detected that was undersensing.  Monthly summary reports  and ROV/PRN Kyra Manges  Carron Curie, RN, CCDS, [employer]     Assessment and Plan: Patient Active Problem List   Diagnosis Date Noted   Paroxysmal atrial fibrillation ([place]) 12/09/2021   Hypercoagulable state due to paroxysmal atrial fibrillation ([place]); CHA2DS2-Vasc: 6 12/09/2021   OSA (obstructive sleep apnea) 01/03/2021   Other hypersomnia 01/03/2021   Atherosclerosis of native arteries of extremity with intermittent claudication ([place]) 09/13/2020   Stroke ([place]) 07/04/2020   COPD (chronic obstructive pulmonary disease) (HCC)    HLD (hyperlipidemia)    Chronic diastolic CHF  (congestive heart failure) ([place])    CKD (chronic kidney disease), stage IIIb    Encounter for general adult medical examination with abnormal findings 02/09/2020   Vitamin D deficiency 02/09/2020   Dysuria 02/09/2020   Bilateral leg pain 11/28/2019   Inflammatory polyarthropathy ([place]) 11/28/2019   Alcohol use disorder    Hypertrophic obstructive cardiomyopathy ([place])    Syncope 12/25/2016   Heart palpitations - PACs 12/24/2016   Shortness of breath at rest 12/24/2016   Coronary artery disease, non-occlusive 12/2016   Ventral hernia 08/13/2016   Agitation 08/11/2015   Substance induced mood disorder ([place]) 08/11/2015   Panic disorder with agoraphobia 07/13/2015   Major depressive disorder, recurrent severe without psychotic features ([place]) 07/12/2015   Tobacco use disorder 07/12/2015   Substance abuse ([place]) 07/12/2015   Generalized anxiety disorder 05/17/2014   Hypertriglyceridemia 05/05/2014   GERD (gastroesophageal reflux disease) 05/05/2014   Essential hypertension 05/05/2014    1. Lung nodule, solitary The pulmonary nodule is of concern I think we need to do the next study which will be a PET scan I will go ahead and order a initial evaluation with PET/CT. - NM PET Image Initial (PI) Skull Base To Thigh (F-18 FDG); Future  2. SOB (shortness of breath) Shortness of breath is likely multifactorial above-mentioned COPD underlying is contributing to her shortness of breath need to evaluate the severity of disease on pulmonary function - Pulmonary function test; Future  3. Chronic pain of both knees Pain management supportive care continue to follow with her primary team  4. Cigarette nicotine dependence with other nicotine-induced disorder Smoking cessation counseling provided major contributing factor as far as her shortness of breath is concerned  5. Obesity, morbid ([place]) Obesity Counseling: Had a lengthy discussion regarding patients BMI and weight issues. Patient was  instructed on portion control as well as increased activity. Also discussed caloric restrictions with trying to maintain intake less than 2000 Kcal. Discussions were made in accordance with the 5As of weight management. Simple actions such as not eating late and if able to, taking a walk is suggested.   6. OSA (obstructive sleep apnea) Noncompliant with home CPAP discussed overnight with her the importance of compliance with CPAP however oxygen will be tried if she is not going to use the machine  7. Nocturnal hypoxemia Significant nocturnal hypoxemia has been noted we will get set up for oxygen therapy at home. - For home use only DME oxygen   General Counseling: I have discussed the findings of the evaluation and examination with [facility].  I have also discussed any further diagnostic evaluation thatmay be needed or ordered today. Raveena verbalizes understanding of the findings of todays visit. We also reviewed her medications today and discussed drug interactions and side effects including but not limited excessive drowsiness and altered mental states. We also discussed that there is always a risk not just to her but also people around her. she has been encouraged to call the  office with any questions or concerns that should arise related to todays visit.  No orders of the defined types were placed in this encounter.    Time spent: 37  I have personally obtained a history, examined the patient, evaluated laboratory and imaging results, formulated the assessment and plan and placed orders.    Allyne Gee, MD [facility] Pulmonary and Critical Care Sleep medicine

## 2022-03-05 NOTE — Patient Instructions (Signed)
Chronic Obstructive Pulmonary Disease  Chronic obstructive pulmonary disease (COPD) is a long-term (chronic) lung problem. When you have COPD, it is hard for air to get in and out of your lungs. Usually the condition gets worse over time, and your lungs will never return to normal. There are things you can do to keep yourself as healthy as possible. What are the causes? Smoking. This is the most common cause. Certain genes passed from parent to child (inherited). What increases the risk? Being exposed to secondhand smoke from cigarettes, pipes, or cigars. Being exposed to chemicals and other irritants, such as fumes and dust in the work environment. Having chronic lung conditions or infections. What are the signs or symptoms? Shortness of breath, especially during physical activity. A long-term cough with a large amount of thick mucus. Sometimes, the cough may not have any mucus (dry cough). Wheezing. Breathing quickly. Skin that looks gray or blue, especially in the fingers, toes, or lips. Feeling tired (fatigue). Weight loss. Chest tightness. Having infections often. Episodes when breathing symptoms become much worse (exacerbations). At the later stages of this disease, you may have swelling in the ankles, feet, or legs. How is this treated? Taking medicines. Quitting smoking, if you smoke. Rehabilitation. This includes steps to make your body work better. It may involve a team of specialists. Doing exercises. Making changes to your diet. Using oxygen. Lung surgery. Lung transplant. Comfort measures (palliative care). Follow these instructions at home: Medicines Take over-the-counter and prescription medicines only as told by your doctor. Talk to your doctor before taking any cough or allergy medicines. You may need to avoid medicines that cause your lungs to be dry. Lifestyle If you smoke, stop smoking. Smoking makes the problem worse. Do not smoke or use any products that  contain nicotine or tobacco. If you need help quitting, ask your doctor. Avoid being around things that make your breathing worse. This may include smoke, chemicals, and fumes. Stay active, but remember to rest as well. Learn and use tips on how to manage stress and control your breathing. Make sure you get enough sleep. Most adults need at least 7 hours of sleep every night. Eat healthy foods. Eat smaller meals more often. Rest before meals. Controlled breathing Learn and use tips on how to control your breathing as told by your doctor. Try: Breathing in (inhaling) through your nose for 1 second. Then, pucker your lips and breath out (exhale) through your lips for 2 seconds. Putting one hand on your belly (abdomen). Breathe in slowly through your nose for 1 second. Your hand on your belly should move out. Pucker your lips and breathe out slowly through your lips. Your hand on your belly should move in as you breathe out.  Controlled coughing Learn and use controlled coughing to clear mucus from your lungs. Follow these steps: Lean your head a little forward. Breathe in deeply. Try to hold your breath for 3 seconds. Keep your mouth slightly open while coughing 2 times. Spit any mucus out into a tissue. Rest and do the steps again 1 or 2 times as needed. General instructions Make sure you get all the shots (vaccines) that your doctor recommends. Ask your doctor about a flu shot and a pneumonia shot. Use oxygen therapy and pulmonary rehabilitation if told by your doctor. If you need home oxygen therapy, ask your doctor if you should buy a tool to measure your oxygen level (oximeter). Make a COPD action plan with your doctor. This helps you   to know what to do if you feel worse than usual. Manage any other conditions you have as told by your doctor. Avoid going outside when it is very hot, cold, or humid. Avoid people who have a sickness you can catch (contagious). Keep all follow-up  visits. Contact a doctor if: You cough up more mucus than usual. There is a change in the color or thickness of the mucus. It is harder to breathe than usual. Your breathing is faster than usual. You have trouble sleeping. You need to use your medicines more often than usual. You have trouble doing your normal activities such as getting dressed or walking around the house. Get help right away if: You have shortness of breath while resting. You have shortness of breath that stops you from: Being able to talk. Doing normal activities. Your chest hurts for longer than 5 minutes. Your skin color is more blue than usual. Your pulse oximeter shows that you have low oxygen for longer than 5 minutes. You have a fever. You feel too tired to breathe normally. These symptoms may represent a serious problem that is an emergency. Do not wait to see if the symptoms will go away. Get medical help right away. Call your local emergency services (911 in the [place] Do not drive yourself to the hospital. Summary Chronic obstructive pulmonary disease (COPD) is a long-term lung problem. The way your lungs work will never return to normal. Usually the condition gets worse over time. There are things you can do to keep yourself as healthy as possible. Take over-the-counter and prescription medicines only as told by your doctor. If you smoke, stop. Smoking makes the problem worse. This information is not intended to replace advice given to you by your health care provider. Make sure you discuss any questions you have with your health care provider. Document Revised: 03/15/2020 Document Reviewed: 03/15/2020 Elsevier Patient Education  [place]. Sleep Apnea Sleep apnea affects breathing during sleep. It causes breathing to stop for 10 seconds or more, or to become shallow. People with sleep apnea usually snore loudly. It can also increase the risk of: Heart attack. Stroke. Being very overweight  (obese). Diabetes. Heart failure. Irregular heartbeat. High blood pressure. The goal of treatment is to help you breathe normally again. What are the causes?  The most common cause of this condition is a collapsed or blocked airway. There are three kinds of sleep apnea: Obstructive sleep apnea. This is caused by a blocked or collapsed airway. Central sleep apnea. This happens when the brain does not send the right signals to the muscles that control breathing. Mixed sleep apnea. This is a combination of obstructive and central sleep apnea. What increases the risk? Being overweight. Smoking. Having a small airway. Being older. Being female. Drinking alcohol. Taking medicines to calm yourself (sedatives or tranquilizers). Having family members with the condition. Having a tongue or tonsils that are larger than normal. What are the signs or symptoms? Trouble staying asleep. Loud snoring. Headaches in the morning. Waking up gasping. Dry mouth or sore throat in the morning. Being sleepy or tired during the day. If you are sleepy or tired during the day, you may also: Not be able to focus your mind (concentrate). Forget things. Get angry a lot and have mood swings. Feel sad (depressed). Have changes in your personality. Have less interest in sex, if you are female. Be unable to have an erection, if you are female. How is this treated?  Sleeping on  your side. Using a medicine to get rid of mucus in your nose (decongestant). Avoiding the use of alcohol, medicines to help you relax, or certain pain medicines (narcotics). Losing weight, if needed. Changing your diet. Quitting smoking. Using a machine to open your airway while you sleep, such as: An oral appliance. This is a mouthpiece that shifts your lower jaw forward. A CPAP device. This device blows air through a mask when you breathe out (exhale). An EPAP device. This has valves that you put in each nostril. A BIPAP device.  This device blows air through a mask when you breathe in (inhale) and breathe out. Having surgery if other treatments do not work. Follow these instructions at home: Lifestyle Make changes that your doctor recommends. Eat a healthy diet. Lose weight if needed. Avoid alcohol, medicines to help you relax, and some pain medicines. Do not smoke or use any products that contain nicotine or tobacco. If you need help quitting, ask your doctor. General instructions Take over-the-counter and prescription medicines only as told by your doctor. If you were given a machine to use while you sleep, use it only as told by your doctor. If you are having surgery, make sure to tell your doctor you have sleep apnea. You may need to bring your device with you. Keep all follow-up visits. Contact a doctor if: The machine that you were given to use during sleep bothers you or does not seem to be working. You do not get better. You get worse. Get help right away if: Your chest hurts. You have trouble breathing in enough air. You have an uncomfortable feeling in your back, arms, or stomach. You have trouble talking. One side of your body feels weak. A part of your face is hanging down. These symptoms may be an emergency. Get help right away. Call your local emergency services (911 in the [place] Do not wait to see if the symptoms will go away. Do not drive yourself to the hospital. Summary This condition affects breathing during sleep. The most common cause is a collapsed or blocked airway. The goal of treatment is to help you breathe normally while you sleep. This information is not intended to replace advice given to you by your health care provider. Make sure you discuss any questions you have with your health care provider. Document Revised: 12/14/2020 Document Reviewed: 04/15/2020 Elsevier Patient Education  [place]. Pulmonary Nodule  A pulmonary nodule is a small, round growth of tissue  in the lung. A nodule may be cancer, but most nodules are not cancer. What are the causes? Infection from a germ (bacteria, fungus, or virus), such as tuberculosis. Tissue that is cancer, such as: Cancer in the lung. Cancer that has spread to the lung from another part of the body. A growth of tissue (mass) that is not cancer. Swelling and irritation from conditions such as rheumatoid arthritis. Having blood vessels that are not normal in the lungs. What are the signs or symptoms? Many times, there are no symptoms. If you get symptoms, they normally have another cause, such as infection. How is this treated? Treatment depends on: If your nodule is cancer or if it is not cancer. What your risk of getting cancer is. Some nodules are not cancer. If this is the case for you, you may not need treatment. Your doctor may do tests to watch the nodule for changes. If the nodule is cancer: You will need tests, such as CT and PET scans. You  may need treatment. This may include: Surgery. Treatment with high-energy X-rays (radiation therapy). Medicines. Some nodules need to be taken out. You may have a procedure to have the nodule taken out. During the procedure, your doctor will make a cut (incision) into your chest and take out the part of your lung that has the nodule. Follow these instructions at home: Take over-the-counter and prescription medicines only as told by your doctor. Do not smoke or use any products that contain nicotine or tobacco. If you need help quitting, ask your doctor. Keep all follow-up visits. Contact a doctor if: You have pain in your chest, back, or shoulder. You are short of breath or have trouble breathing when you are active. You get a cough. Your voice starts to sound raspy, breathy, or strained (hoarse), and you do not know why. You feel sick or more tired than normal. You do not feel like eating. You lose weight without trying. You get chills, or you start to  sweat a lot during sleep. You need two or more pillows to sleep on at night. You have: A fever and your symptoms get worse all of a sudden. A fever or symptoms for more than 2-3 days. Get help right away if: You cannot catch your breath. You have sudden chest pain. You start making high-pitched whistling sounds when you breathe, most often when you breathe out (you wheeze). You cannot stop coughing. You cough up blood or bloody mucus from your lungs (sputum). You get dizzy or feel like you may faint. These symptoms may represent a serious problem that is an emergency. Do not wait to see if the symptoms will go away. Get medical help right away. Call your local emergency services (911 in the [place] Do not drive yourself to the hospital. Summary A pulmonary nodule is a small, round growth of tissue in the lung. Most of these nodules are not cancer. Common causes of nodules in the lung include infection, swelling and irritation, and growths that are not cancer. Treatment depends on whether the nodule is cancer or is not cancer. Treatment also depends on your risk of getting cancer. If the nodule is cancer, you will need certain tests and treatments as told by your doctor. This information is not intended to replace advice given to you by your health care provider. Make sure you discuss any questions you have with your health care provider. Document Revised: 11/25/2019 Document Reviewed: 11/25/2019 Elsevier Patient Education  [place].

## 2022-03-05 NOTE — Telephone Encounter (Signed)
Send message to [place] home pt for oxygen ordered

## 2022-03-07 ENCOUNTER — Telehealth: Payer: Self-pay

## 2022-03-12 ENCOUNTER — Ambulatory Visit (INDEPENDENT_AMBULATORY_CARE_PROVIDER_SITE_OTHER): Payer: Medicare Other

## 2022-03-12 DIAGNOSIS — I639 Cerebral infarction, unspecified: Secondary | ICD-10-CM

## 2022-03-12 LAB — CUP PACEART REMOTE DEVICE CHECK
Date Time Interrogation Session: 20231022232319
Implantable Pulse Generator Implant Date: 20220330

## 2022-03-12 NOTE — Telephone Encounter (Signed)
Spoke with [facility] that are working on Oxygen order

## 2022-03-14 ENCOUNTER — Telehealth: Payer: Self-pay | Admitting: Internal Medicine

## 2022-03-14 NOTE — Telephone Encounter (Signed)
Left vm and sent mychart message to confirm 03/21/22 appointment-Toni 

## 2022-03-15 DIAGNOSIS — G4733 Obstructive sleep apnea (adult) (pediatric): Secondary | ICD-10-CM | POA: Diagnosis not present

## 2022-03-19 ENCOUNTER — Encounter (INDEPENDENT_AMBULATORY_CARE_PROVIDER_SITE_OTHER): Payer: Self-pay

## 2022-03-19 ENCOUNTER — Ambulatory Visit
Admission: RE | Admit: 2022-03-19 | Discharge: 2022-03-19 | Disposition: A | Discharge status: Home / Self Care | Payer: Medicare Other | Source: Ambulatory Visit | Attending: Internal Medicine | Admitting: Internal Medicine

## 2022-03-19 DIAGNOSIS — R590 Localized enlarged lymph nodes: Secondary | ICD-10-CM | POA: Diagnosis not present

## 2022-03-19 DIAGNOSIS — R911 Solitary pulmonary nodule: Secondary | ICD-10-CM | POA: Insufficient documentation

## 2022-03-19 DIAGNOSIS — K5730 Diverticulosis of large intestine without perforation or abscess without bleeding: Secondary | ICD-10-CM | POA: Diagnosis not present

## 2022-03-19 DIAGNOSIS — I700 Atherosclerosis of aorta: Secondary | ICD-10-CM | POA: Diagnosis not present

## 2022-03-19 DIAGNOSIS — I2510 Atherosclerotic heart disease of native coronary artery without angina pectoris: Secondary | ICD-10-CM | POA: Diagnosis not present

## 2022-03-19 LAB — GLUCOSE, CAPILLARY: Glucose-Capillary: 91 mg/dL (ref 70–99)

## 2022-03-19 MED ORDER — FLUDEOXYGLUCOSE F - 18 (FDG) INJECTION
10.3300 | Freq: Once | INTRAVENOUS | Status: AC | PRN
  Administered 2022-03-19: 10.33 via INTRAVENOUS

## 2022-03-20 ENCOUNTER — Telehealth: Payer: Self-pay | Admitting: Internal Medicine

## 2022-03-20 NOTE — Telephone Encounter (Signed)
Patient called stating after she got home from PET scan, she fell going over [place]. Has bruise and hard knot on leg. When she went into the bathroom, she fell again and vomited. Per Alyssa, patient needs to go to ED to be evaluated. Having PET scan should not have caused patient falling. I notified patient-Toni

## 2022-03-21 ENCOUNTER — Ambulatory Visit (INDEPENDENT_AMBULATORY_CARE_PROVIDER_SITE_OTHER): Payer: Medicare Other | Admitting: Internal Medicine

## 2022-03-21 DIAGNOSIS — R0602 Shortness of breath: Secondary | ICD-10-CM | POA: Diagnosis not present

## 2022-03-23 ENCOUNTER — Encounter: Payer: Self-pay | Admitting: Emergency Medicine

## 2022-03-23 ENCOUNTER — Other Ambulatory Visit: Payer: Self-pay

## 2022-03-23 ENCOUNTER — Emergency Department
Admission: EM | Admit: 2022-03-23 | Discharge: 2022-03-23 | Disposition: A | Discharge status: Home / Self Care | Payer: Medicare Other | Attending: Emergency Medicine | Admitting: Emergency Medicine

## 2022-03-23 ENCOUNTER — Emergency Department: Payer: Medicare Other

## 2022-03-23 DIAGNOSIS — N189 Chronic kidney disease, unspecified: Secondary | ICD-10-CM | POA: Diagnosis not present

## 2022-03-23 DIAGNOSIS — W01198A Fall on same level from slipping, tripping and stumbling with subsequent striking against other object, initial encounter: Secondary | ICD-10-CM | POA: Diagnosis not present

## 2022-03-23 DIAGNOSIS — I2510 Atherosclerotic heart disease of native coronary artery without angina pectoris: Secondary | ICD-10-CM | POA: Insufficient documentation

## 2022-03-23 DIAGNOSIS — S79921A Unspecified injury of right thigh, initial encounter: Secondary | ICD-10-CM | POA: Diagnosis present

## 2022-03-23 DIAGNOSIS — J449 Chronic obstructive pulmonary disease, unspecified: Secondary | ICD-10-CM | POA: Insufficient documentation

## 2022-03-23 DIAGNOSIS — M47812 Spondylosis without myelopathy or radiculopathy, cervical region: Secondary | ICD-10-CM | POA: Diagnosis not present

## 2022-03-23 DIAGNOSIS — S7011XA Contusion of right thigh, initial encounter: Secondary | ICD-10-CM | POA: Insufficient documentation

## 2022-03-23 DIAGNOSIS — I130 Hypertensive heart and chronic kidney disease with heart failure and stage 1 through stage 4 chronic kidney disease, or unspecified chronic kidney disease: Secondary | ICD-10-CM | POA: Insufficient documentation

## 2022-03-23 DIAGNOSIS — S0990XA Unspecified injury of head, initial encounter: Secondary | ICD-10-CM | POA: Insufficient documentation

## 2022-03-23 DIAGNOSIS — M7981 Nontraumatic hematoma of soft tissue: Secondary | ICD-10-CM | POA: Diagnosis not present

## 2022-03-23 DIAGNOSIS — Y92009 Unspecified place in unspecified non-institutional (private) residence as the place of occurrence of the external cause: Secondary | ICD-10-CM | POA: Insufficient documentation

## 2022-03-23 DIAGNOSIS — I509 Heart failure, unspecified: Secondary | ICD-10-CM | POA: Diagnosis not present

## 2022-03-23 DIAGNOSIS — R0781 Pleurodynia: Secondary | ICD-10-CM | POA: Diagnosis not present

## 2022-03-23 DIAGNOSIS — S7001XA Contusion of right hip, initial encounter: Secondary | ICD-10-CM | POA: Diagnosis not present

## 2022-03-23 DIAGNOSIS — I10 Essential (primary) hypertension: Secondary | ICD-10-CM | POA: Diagnosis not present

## 2022-03-23 DIAGNOSIS — M7989 Other specified soft tissue disorders: Secondary | ICD-10-CM | POA: Diagnosis not present

## 2022-03-23 DIAGNOSIS — I6381 Other cerebral infarction due to occlusion or stenosis of small artery: Secondary | ICD-10-CM | POA: Diagnosis not present

## 2022-03-23 DIAGNOSIS — M533 Sacrococcygeal disorders, not elsewhere classified: Secondary | ICD-10-CM | POA: Diagnosis not present

## 2022-03-23 DIAGNOSIS — M25551 Pain in right hip: Secondary | ICD-10-CM | POA: Diagnosis not present

## 2022-03-23 DIAGNOSIS — Z7901 Long term (current) use of anticoagulants: Secondary | ICD-10-CM | POA: Insufficient documentation

## 2022-03-23 LAB — CBC
HCT: 29 % — ABNORMAL LOW (ref 36.0–46.0)
Hemoglobin: 10.1 g/dL — ABNORMAL LOW (ref 12.0–15.0)
MCH: 31.1 pg (ref 26.0–34.0)
MCHC: 34.8 g/dL (ref 30.0–36.0)
MCV: 89.2 fL (ref 80.0–100.0)
Platelets: 130 10*3/uL — ABNORMAL LOW (ref 150–400)
RBC: 3.25 MIL/uL — ABNORMAL LOW (ref 3.87–5.11)
RDW: 12.4 % (ref 11.5–15.5)
WBC: 11.6 10*3/uL — ABNORMAL HIGH (ref 4.0–10.5)
nRBC: 0 % (ref 0.0–0.2)

## 2022-03-23 LAB — BASIC METABOLIC PANEL
Anion gap: 9 (ref 5–15)
BUN: 17 mg/dL (ref 8–23)
CO2: 24 mmol/L (ref 22–32)
Calcium: 8.9 mg/dL (ref 8.9–10.3)
Chloride: 106 mmol/L (ref 98–111)
Creatinine, Ser: 0.85 mg/dL (ref 0.44–1.00)
GFR, Estimated: >60 mL/min (ref >60)
Glucose, Bld: 126 mg/dL — ABNORMAL HIGH (ref 70–99)
Potassium: 3.5 mmol/L (ref 3.5–5.1)
Sodium: 139 mmol/L (ref 135–145)

## 2022-03-23 LAB — URINALYSIS, ROUTINE W REFLEX MICROSCOPIC
Bacteria, UA: NONE SEEN
Bilirubin Urine: NEGATIVE
Glucose, UA: NEGATIVE mg/dL
Hgb urine dipstick: NEGATIVE
Ketones, ur: NEGATIVE mg/dL
Nitrite: NEGATIVE
Protein, ur: NEGATIVE mg/dL
Specific Gravity, Urine: 1.017 (ref 1.005–1.030)
pH: 5 (ref 5.0–8.0)

## 2022-03-23 LAB — PROTIME-INR
INR: 1.3 — ABNORMAL HIGH (ref 0.8–1.2)
Prothrombin Time: 16.4 seconds — ABNORMAL HIGH (ref 11.4–15.2)

## 2022-03-23 LAB — LACTIC ACID, PLASMA: Lactic Acid, Venous: 1.1 mmol/L (ref 0.5–1.9)

## 2022-03-23 MED ORDER — ONDANSETRON 4 MG PO TBDP
4.0000 mg | ORAL_TABLET | Freq: Once | ORAL | Status: AC
  Administered 2022-03-23: 4 mg via ORAL
  Filled 2022-03-23: qty 1

## 2022-03-23 MED ORDER — TRAMADOL HCL 50 MG PO TABS: 50.0000 mg | ORAL_TABLET | Freq: Three times a day (TID) | ORAL | 0 refills | Status: DC | PRN

## 2022-03-23 MED ORDER — HYDROCODONE-ACETAMINOPHEN 5-325 MG PO TABS
1.0000 | ORAL_TABLET | Freq: Once | ORAL | Status: AC
  Administered 2022-03-23: 1 via ORAL
  Filled 2022-03-23: qty 1
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
## 2022-03-23 NOTE — Procedures (Signed)
[facility] [place], [address]    Complete Pulmonary Function Testing Interpretation:  FINDINGS:  Forced vital capacity is normal FEV1 is normal FEV1 FVC ratio was normal.  Postbronchodilator no significant change in FEV1 however clinical improvement may occur in the absence of spirometric improvement.  Total lung capacity is normal residual volume is normal FRC is normal.  DLCO was mildly decreased.  This may be affected by history of smoking  IMPRESSION:  This pulmonary function study is within normal limits.  There is an isolated decrease in DLCO with history of smoking this may skew results.  Allyne Gee, MD [facility] Pulmonary Critical Care Medicine Sleep Medicine
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
## 2022-03-23 NOTE — Discharge Instructions (Signed)
Your exam, labs, x-rays, and CT is all normal and reassuring.  No signs of a fracture related to your fall.  Also there is no sign of any head injury including bleeding or swelling from the head CTs.  You do have a significant area of bruising and swelling to the right thigh noticed a hematoma.  This is aggravated by your regular blood thinner use.  Apply warm compresses to help promote healing.  Take your home medications including the pain medicine as needed.  Use your walker and cane to ambulate at all times.  Follow-up with your primary provider for ongoing symptoms.

## 2022-03-23 NOTE — ED Provider Triage Note (Signed)
Emergency Medicine Provider Triage Evaluation Note  Sarah Barnes , a 71 y.o. female  was evaluated in triage.  Pt complains of right leg pain and right rib pain after 2 separate falls. First fall was outside and second one in the house.  No head injury with each.    Review of Systems  Positive: Right posterior thigh pain, rib pain right Negative: No head injury or headache  Physical Exam  BP 103/72   Pulse 96   Temp 100.3 F (37.9 C) (Oral)   Resp 17   SpO2 94%  Gen:   Awake, no distress  talkative, alert Resp:  Normal effort  MSK:   Moves extremities without difficulty.  Able to stand and bear weight.  Posterior thigh with marked ecchymosis, open skin and firm to touch.  Other:  Right lateral ribs with deformity but tender.   Medical Decision Making  Medically screening exam initiated at 1:55 PM.  Appropriate orders placed.  Sarah Barnes was informed that the remainder of the evaluation will be completed by another provider, this initial triage assessment does not replace that evaluation, and the importance of remaining in the ED until their evaluation is complete.     Johnn Hai, PA-C 03/23/22 1420

## 2022-03-24 ENCOUNTER — Telehealth: Payer: Self-pay | Admitting: Emergency Medicine

## 2022-03-24 MED ORDER — TRAMADOL HCL 50 MG PO TABS: 50.0000 mg | ORAL_TABLET | Freq: Four times a day (QID) | ORAL | 0 refills | Status: DC | PRN

## 2022-03-24 NOTE — Telephone Encounter (Signed)
Patient called and stated she had lost her prescription for tramadol filled yesterday, which was a paper prescription.  I reviewed the PDMP and verified that the patient had not filled the prescription.  I am providing a replacement prescription to be sent to [facility]

## 2022-03-26 ENCOUNTER — Telehealth: Payer: Self-pay

## 2022-03-26 ENCOUNTER — Other Ambulatory Visit: Payer: Self-pay | Admitting: Physician Assistant

## 2022-03-26 ENCOUNTER — Telehealth: Payer: Self-pay | Admitting: Physician Assistant

## 2022-03-26 DIAGNOSIS — W19XXXA Unspecified fall, initial encounter: Secondary | ICD-10-CM

## 2022-03-26 MED ORDER — TRAMADOL HCL 50 MG PO TABS: 50.0000 mg | ORAL_TABLET | Freq: Three times a day (TID) | ORAL | 0 refills | Status: AC | PRN

## 2022-03-26 NOTE — Telephone Encounter (Signed)
Called patient to schedule ED follow up. She declined due to no transportation-Toni

## 2022-03-26 NOTE — Telephone Encounter (Signed)
As per lauren advised that we cannot pres stronger pain med due to she having falls she send tramadol to [facility] if she continue having pain med need appt

## 2022-03-27 LAB — PULMONARY FUNCTION TEST

## 2022-04-02 ENCOUNTER — Ambulatory Visit: Payer: Medicare Other | Admitting: Internal Medicine

## 2022-04-02 NOTE — Progress Notes (Signed)
Carelink Summary Report / Loop Recorder 

## 2022-04-08 ENCOUNTER — Emergency Department
Admission: EM | Admit: 2022-04-08 | Discharge: 2022-04-09 | Disposition: A | Discharge status: Home / Self Care | Payer: Medicare Other | Attending: Emergency Medicine | Admitting: Emergency Medicine

## 2022-04-08 ENCOUNTER — Other Ambulatory Visit: Payer: Self-pay

## 2022-04-08 ENCOUNTER — Emergency Department: Payer: Medicare Other

## 2022-04-08 ENCOUNTER — Encounter: Payer: Self-pay | Admitting: Radiology

## 2022-04-08 DIAGNOSIS — I480 Paroxysmal atrial fibrillation: Secondary | ICD-10-CM | POA: Diagnosis not present

## 2022-04-08 DIAGNOSIS — F17200 Nicotine dependence, unspecified, uncomplicated: Secondary | ICD-10-CM | POA: Diagnosis not present

## 2022-04-08 DIAGNOSIS — I499 Cardiac arrhythmia, unspecified: Secondary | ICD-10-CM | POA: Diagnosis not present

## 2022-04-08 DIAGNOSIS — Z743 Need for continuous supervision: Secondary | ICD-10-CM | POA: Diagnosis not present

## 2022-04-08 DIAGNOSIS — R0689 Other abnormalities of breathing: Secondary | ICD-10-CM | POA: Diagnosis not present

## 2022-04-08 DIAGNOSIS — Z9981 Dependence on supplemental oxygen: Secondary | ICD-10-CM | POA: Insufficient documentation

## 2022-04-08 DIAGNOSIS — I10 Essential (primary) hypertension: Secondary | ICD-10-CM | POA: Diagnosis not present

## 2022-04-08 DIAGNOSIS — R6889 Other general symptoms and signs: Secondary | ICD-10-CM | POA: Diagnosis not present

## 2022-04-08 DIAGNOSIS — I4891 Unspecified atrial fibrillation: Secondary | ICD-10-CM | POA: Diagnosis not present

## 2022-04-08 DIAGNOSIS — R0602 Shortness of breath: Secondary | ICD-10-CM | POA: Diagnosis not present

## 2022-04-08 DIAGNOSIS — J441 Chronic obstructive pulmonary disease with (acute) exacerbation: Secondary | ICD-10-CM | POA: Diagnosis not present

## 2022-04-08 LAB — CBC WITH DIFFERENTIAL/PLATELET
Abs Immature Granulocytes: 0.02 10*3/uL (ref 0.00–0.07)
Basophils Absolute: 0 10*3/uL (ref 0.0–0.1)
Basophils Relative: 1 %
Eosinophils Absolute: 0.2 10*3/uL (ref 0.0–0.5)
Eosinophils Relative: 4 %
HCT: 33.6 % — ABNORMAL LOW (ref 36.0–46.0)
Hemoglobin: 11.4 g/dL — ABNORMAL LOW (ref 12.0–15.0)
Immature Granulocytes: 0 %
Lymphocytes Relative: 38 %
Lymphs Abs: 1.9 10*3/uL (ref 0.7–4.0)
MCH: 32.3 pg (ref 26.0–34.0)
MCHC: 33.9 g/dL (ref 30.0–36.0)
MCV: 95.2 fL (ref 80.0–100.0)
Monocytes Absolute: 0.5 10*3/uL (ref 0.1–1.0)
Monocytes Relative: 9 %
Neutro Abs: 2.4 10*3/uL (ref 1.7–7.7)
Neutrophils Relative %: 48 %
Platelets: 210 10*3/uL (ref 150–400)
RBC: 3.53 MIL/uL — ABNORMAL LOW (ref 3.87–5.11)
RDW: 15.3 % (ref 11.5–15.5)
WBC: 5 10*3/uL (ref 4.0–10.5)
nRBC: 0 % (ref 0.0–0.2)

## 2022-04-08 LAB — BASIC METABOLIC PANEL
Anion gap: 10 (ref 5–15)
BUN: 9 mg/dL (ref 8–23)
CO2: 26 mmol/L (ref 22–32)
Calcium: 9 mg/dL (ref 8.9–10.3)
Chloride: 105 mmol/L (ref 98–111)
Creatinine, Ser: 0.67 mg/dL (ref 0.44–1.00)
GFR, Estimated: >60 mL/min (ref >60)
Glucose, Bld: 117 mg/dL — ABNORMAL HIGH (ref 70–99)
Potassium: 3.2 mmol/L — ABNORMAL LOW (ref 3.5–5.1)
Sodium: 141 mmol/L (ref 135–145)

## 2022-04-08 LAB — TROPONIN I (HIGH SENSITIVITY): Troponin I (High Sensitivity): 6 ng/L (ref <18)

## 2022-04-08 MED ORDER — PREDNISONE 20 MG PO TABS
60.0000 mg | ORAL_TABLET | Freq: Once | ORAL | Status: AC
  Administered 2022-04-08: 60 mg via ORAL
  Filled 2022-04-08: qty 3

## 2022-04-08 MED ORDER — ALBUTEROL SULFATE HFA 108 (90 BASE) MCG/ACT IN AERS: 2.0000 | INHALATION_SPRAY | Freq: Four times a day (QID) | RESPIRATORY_TRACT | 2 refills | Status: AC | PRN

## 2022-04-08 MED ORDER — PREDNISONE 10 MG PO TABS: 10.0000 mg | ORAL_TABLET | Freq: Every day | ORAL | 0 refills | Status: DC

## 2022-04-08 NOTE — ED Triage Notes (Signed)
First Nurse Note;  Pt via EMS from home. Pt c/o SOB for the past month and half. Wears 2L [place] at night. Pt does smoker. Pt has a hx of COPD/CHF and a fib.  Pt is A&Ox4 and NAD  125/80 95% on RA 80 HR

## 2022-04-08 NOTE — ED Provider Notes (Signed)
[facility] Provider Note    Event Date/Time   First MD Initiated Contact with Patient 04/08/22 2304     (approximate)  History   Chief Complaint: Shortness of Breath  HPI  Sarah Barnes is a 71 y.o. female with a past medical history of COPD on 2 L of oxygen at night, gastric reflux, hypertension, cardiomyopathy, paroxysmal atrial fibrillation on anticoagulation, presents to the emergency department for shortness of breath.  According to the patient for the past 6 weeks or so she has been feeling short of breath.  States it is worse with exertion.  Denies any chest pain.  No fever.  Denies any worsening cough than normal.  Denies any lower extremity swelling.  Patient is a daily smoker.  States she does not take any inhalers currently.  Physical Exam   Triage Vital Signs: ED Triage Vitals [04/08/22 1732]  Enc Vitals Group     BP 139/65     Pulse Rate 72     Resp 18     Temp 98.4 F (36.9 C)     Temp Source Oral     SpO2 94 %     Weight 180 lb (81.6 kg)     Height 5' (1.524 m)     Head Circumference      Peak Flow      Pain Score      Pain Loc      Pain Edu?      Excl. in [place]?     Most recent vital signs: Vitals:   04/08/22 2230 04/08/22 2300  BP: (!) 155/78 139/75  Pulse: (!) 57 (!) 58  Resp: 20 17  Temp:    SpO2: 96% 92%    General: Awake, no distress.  CV:  Good peripheral perfusion.  Regular rate and rhythm  Resp:  Normal effort.  Equal breath sounds bilaterally.  Mild expiratory wheeze bilaterally. Abd:  No distention.  Soft, nontender.  No rebound or guarding. Other:  No lower extreme edema or tenderness.   ED Results / Procedures / Treatments   EKG  EKG viewed and interpreted by myself shows a normal sinus rhythm at 70 bpm with a narrow QRS, normal axis, normal intervals, no concerning ST changes.  RADIOLOGY  I have reviewed and interpreted the chest x-ray images.  I do not see any obvious consolidation on my  evaluation. Radiology has read the x-ray as cardiomegaly without active disease.   MEDICATIONS ORDERED IN ED: Medications  predniSONE (DELTASONE) tablet 60 mg (has no administration in time range)     IMPRESSION / MDM / ASSESSMENT AND PLAN / ED COURSE  I reviewed the triage vital signs and the nursing notes.  Patient's presentation is most consistent with acute presentation with potential threat to life or bodily function.  Patient presents emergency department for shortness of breath.  Patient has a history of COPD is a daily smoker.  She has mild expiratory wheeze on my examination consistent with a possible COPD exacerbation.  States her symptoms have been ongoing for the past 6 weeks.  Patient denies any steroid use or any inhalers or nebulizer use.  Patient's labs are reassuring including a normal CBC with a normal white blood cell count, chemistry is normal and a troponin of 6.  Patient's chest x-ray is clear and EKG is normal.  Given the patient's reassuring work-up, reassuring exam and reassuring vitals I believe the patient would be safe for discharge home on a prednisone  taper and albuterol inhaler to be used as needed.  I discussed this with the patient who is agreeable to the plan.  She will follow-up with her doctor in the next several days for recheck.  Provided my typical shortness of breath/COPD return precautions.  Patient agreeable to plan of care.  FINAL CLINICAL IMPRESSION(S) / ED DIAGNOSES   Dyspnea COPD exacerbation  Rx / DC Orders   Prednisone Albuterol  Note:  This document was prepared using Dragon voice recognition software and may include unintentional dictation errors.   Harvest Dark, MD 04/08/22 2317

## 2022-04-08 NOTE — ED Triage Notes (Signed)
Pt has had shortness of breath for the past 2 weeks following a fall. She has pain to her left side as a result of that fall.

## 2022-04-08 NOTE — ED Provider Triage Note (Signed)
Emergency Medicine Provider Triage Evaluation Note  Sarah Barnes , a 71 y.o. female  was evaluated in triage.  Pt complains of shortness of breath.  Symptoms on and off for 2 weeks.  Cannot lie flat to sleep without oxygen.  Review of Systems  Positive:  Negative:   Physical Exam  BP 139/65 (BP Location: Right Arm)   Pulse 72   Temp 98.4 F (36.9 C) (Oral)   Resp 18   SpO2 94%  Gen:   Awake, no distress   Resp:  Normal effort  MSK:   Moves extremities without difficulty  Other:    Medical Decision Making  Medically screening exam initiated at 5:32 PM.  Appropriate orders placed.  Sarah Barnes was informed that the remainder of the evaluation will be completed by another provider, this initial triage assessment does not replace that evaluation, and the importance of remaining in the ED until their evaluation is complete.     Versie Starks, PA-C 04/08/22 1734

## 2022-04-08 NOTE — ED Notes (Signed)
Lab called to add on troponin 

## 2022-04-09 DIAGNOSIS — R69 Illness, unspecified: Secondary | ICD-10-CM | POA: Diagnosis not present

## 2022-04-09 DIAGNOSIS — Z743 Need for continuous supervision: Secondary | ICD-10-CM | POA: Diagnosis not present

## 2022-04-09 LAB — TROPONIN I (HIGH SENSITIVITY): Troponin I (High Sensitivity): 6 ng/L (ref <18)

## 2022-04-15 DIAGNOSIS — G4733 Obstructive sleep apnea (adult) (pediatric): Secondary | ICD-10-CM | POA: Diagnosis not present

## 2022-04-16 ENCOUNTER — Ambulatory Visit (INDEPENDENT_AMBULATORY_CARE_PROVIDER_SITE_OTHER): Payer: Medicare Other

## 2022-04-16 DIAGNOSIS — I639 Cerebral infarction, unspecified: Secondary | ICD-10-CM | POA: Diagnosis not present

## 2022-04-16 LAB — CUP PACEART REMOTE DEVICE CHECK
Date Time Interrogation Session: 20231126232256
Implantable Pulse Generator Implant Date: 20220330

## 2022-04-18 ENCOUNTER — Other Ambulatory Visit: Payer: Self-pay | Admitting: Physician Assistant

## 2022-04-19 ENCOUNTER — Ambulatory Visit: Payer: Medicare Other | Admitting: Internal Medicine

## 2022-04-26 ENCOUNTER — Telehealth: Payer: Self-pay

## 2022-04-26 NOTE — Telephone Encounter (Signed)
     Patient  visit on 04/09/2022  at [facility] was for Chronic obstructive pulmonary disease with (acute) exacerbation.  Have you been able to follow up with your primary care physician? Patient has appointment on 1211/2023.  The patient was or was not able to obtain any needed medicine or equipment. No medications prescribed.  Are there diet recommendations that you are having difficulty following? No diet recommendations.  Patient expresses understanding of discharge instructions and education provided has no other needs at this time.    [place] [facility] Care Guide   [email or web address]  ?? [phone]   Website: [email or web address]  [email or web address]

## 2022-04-27 ENCOUNTER — Other Ambulatory Visit
Admission: RE | Admit: 2022-04-27 | Discharge: 2022-04-27 | Disposition: A | Discharge status: Home / Self Care | Payer: Medicare Other | Source: Ambulatory Visit | Attending: Nurse Practitioner | Admitting: Nurse Practitioner

## 2022-04-27 ENCOUNTER — Ambulatory Visit: Payer: Medicare Other | Attending: Nurse Practitioner | Admitting: Nurse Practitioner

## 2022-04-27 ENCOUNTER — Encounter: Payer: Self-pay | Admitting: Nurse Practitioner

## 2022-04-27 VITALS — BP 104/76 | HR 57 | Ht 60.0 in | Wt 176.0 lb

## 2022-04-27 DIAGNOSIS — E785 Hyperlipidemia, unspecified: Secondary | ICD-10-CM

## 2022-04-27 DIAGNOSIS — I639 Cerebral infarction, unspecified: Secondary | ICD-10-CM

## 2022-04-27 DIAGNOSIS — Y92480 Sidewalk as the place of occurrence of the external cause: Secondary | ICD-10-CM | POA: Diagnosis not present

## 2022-04-27 DIAGNOSIS — I10 Essential (primary) hypertension: Secondary | ICD-10-CM

## 2022-04-27 DIAGNOSIS — F17200 Nicotine dependence, unspecified, uncomplicated: Secondary | ICD-10-CM | POA: Diagnosis not present

## 2022-04-27 DIAGNOSIS — I2510 Atherosclerotic heart disease of native coronary artery without angina pectoris: Secondary | ICD-10-CM | POA: Diagnosis not present

## 2022-04-27 DIAGNOSIS — I739 Peripheral vascular disease, unspecified: Secondary | ICD-10-CM

## 2022-04-27 DIAGNOSIS — S300XXA Contusion of lower back and pelvis, initial encounter: Secondary | ICD-10-CM | POA: Diagnosis not present

## 2022-04-27 DIAGNOSIS — I421 Obstructive hypertrophic cardiomyopathy: Secondary | ICD-10-CM

## 2022-04-27 DIAGNOSIS — W1839XA Other fall on same level, initial encounter: Secondary | ICD-10-CM | POA: Insufficient documentation

## 2022-04-27 DIAGNOSIS — I480 Paroxysmal atrial fibrillation: Secondary | ICD-10-CM | POA: Diagnosis not present

## 2022-04-27 DIAGNOSIS — Y9389 Activity, other specified: Secondary | ICD-10-CM | POA: Diagnosis not present

## 2022-04-27 DIAGNOSIS — T148XXA Other injury of unspecified body region, initial encounter: Secondary | ICD-10-CM | POA: Diagnosis not present

## 2022-04-27 LAB — CBC
HCT: 44.7 % (ref 36.0–46.0)
Hemoglobin: 14.9 g/dL (ref 12.0–15.0)
MCH: 32 pg (ref 26.0–34.0)
MCHC: 33.3 g/dL (ref 30.0–36.0)
MCV: 95.9 fL (ref 80.0–100.0)
Platelets: 175 10*3/uL (ref 150–400)
RBC: 4.66 MIL/uL (ref 3.87–5.11)
RDW: 14.2 % (ref 11.5–15.5)
WBC: 9 10*3/uL (ref 4.0–10.5)
nRBC: 0 % (ref 0.0–0.2)

## 2022-04-27 LAB — LIPID PANEL
Cholesterol: 210 mg/dL — ABNORMAL HIGH (ref 0–200)
HDL: 54 mg/dL (ref >40)
LDL Cholesterol: 91 mg/dL (ref 0–99)
Total CHOL/HDL Ratio: 3.9 RATIO
Triglycerides: 327 mg/dL — ABNORMAL HIGH (ref <150)
VLDL: 65 mg/dL — ABNORMAL HIGH (ref 0–40)

## 2022-04-27 MED ORDER — FENOFIBRATE MICRONIZED 134 MG PO CAPS: 134.0000 mg | ORAL_CAPSULE | Freq: Every day | ORAL | 3 refills | Status: AC

## 2022-04-27 NOTE — Progress Notes (Addendum)
Office Visit    Patient Name: Sarah Barnes Date of Encounter: 04/27/2022  Primary Care Provider:  Mylinda Latina, PA-C Primary Cardiologist:  Glenetta Hew, MD  Chief Complaint    71 year old female with history of paroxysmal atrial fibrillation, nonobstructive CAD, hypertension, hyperlipidemia, tobacco abuse, alcohol abuse, HCM, diastolic dysfunction, anxiety, depression, COPD, peripheral arterial disease, and cryptogenic stroke, presents for follow-up of A-fib.  Past Medical History    Past Medical History:  Diagnosis Date   Agoraphobia with panic disorder    Anemia    Arthritis    "all over" (08/13/2016)   Chronic bronchitis (HCC)    COPD (chronic obstructive pulmonary disease) ([place])    Cryptogenic stroke ([place])    a. 06/2020 Head CT: low density caudate nucleus concerning for infarct; b. 07/2020 s/p MDT Linq; c. 11/2020 Finding of Afib on Linq.   Depression    ETOH abuse    GAD (generalized anxiety disorder)    GERD (gastroesophageal reflux disease)    Headache    "daily til I got glasses; now have headache once in awhile" (08/13/2016)   Hepatitis 1960   "isolated &  hospitalized for 2 weeks; don't know which kind of hepatitis"    Hernia, hiatal    High cholesterol    Hypertension    Hypertrophic obstructive cardiomyopathy ([place]) 12/2016   Echo showed increased LV wall thickness with grade 2 mmHg LVOT gradient and SAM along with GRII DD.  Consistent with HOCM.   Mild aortic stenosis    a. 01/2019 Echo: EF 55-60%. Mild AS; b. 12/2019 Echo: Nl EF. Mild diast dysfxn. Trace AS/AI/TR. Mild MR.   Nonobstructive CAD (coronary artery disease)    a. 12/2016 Cath: LM nl, LAD min irregs, LCX nl, OM1/2 min irregs, RCA nl, RPDA min irregs. EF 55-65%.   PAD (peripheral artery disease) ([place])    a. 09/29/2020 s/p PTA/DBA to L prox/mid SFA and stenting of L SFA; b. 10/06/2020 PTA of R TP trunk and prox peroenal. PTA/DBA of R SFA and prox R Popliteal. R SFA stenting x 2; c. 10/2020  ABI: R 0.92, L 0.93.   PAF (paroxysmal atrial fibrillation) ([place])    a.11/2020 PAF noted on Linq; b. CHA2DS2VASc = 6-->eliquis.   Pneumonia    "once or twice" (08/13/2016)   PONV (postoperative nausea and vomiting)    Sciatica    Tobacco abuse    Past Surgical History:  Procedure Laterality Date   ANAL FISTULECTOMY  1970s X 3   BACK SURGERY     Cardiac MRI     Hypertrophic cardiomyopathy with septal thickness of 19 mm. EF 72%, moderate MR. . Moderate LAD. Partial fusion of right and left cusps of the aortic valve- no stenosis. LVOT turbulence , transient SAM   COLONOSCOPY WITH PROPOFOL N/A 04/24/2021   Procedure: COLONOSCOPY WITH PROPOFOL;  Surgeon: [name], MD;  Location: [facility] ENDOSCOPY;  Service: Gastroenterology;  Laterality: N/A;   COLONOSCOPY WITH PROPOFOL N/A 06/15/2021   Procedure: COLONOSCOPY WITH PROPOFOL;  Surgeon: [name], MD;  Location: [facility] ENDOSCOPY;  Service: Gastroenterology;  Laterality: N/A;   FRACTURE SURGERY     HERNIA REPAIR     KNEE ARTHROSCOPY Right    LAPAROSCOPIC CHOLECYSTECTOMY     LAPAROSCOPIC INCISIONAL / UMBILICAL / VENTRAL HERNIA REPAIR  08/13/2016   [place] w/mesh   LOWER EXTREMITY ANGIOGRAPHY Left 09/29/2020   Procedure: LOWER EXTREMITY ANGIOGRAPHY;  Surgeon: Algernon Huxley, MD;  Location: [place] CV LAB;  Service: Cardiovascular;  Laterality: Left;   LOWER EXTREMITY ANGIOGRAPHY Right 10/06/2020   Procedure: LOWER EXTREMITY ANGIOGRAPHY;  Surgeon: Algernon Huxley, MD;  Location: [place] CV LAB;  Service: Cardiovascular;  Laterality: Right;   LUMBAR DISC SURGERY     "herniated disc"   NASAL FRACTURE SURGERY  1970s X 2   RIGHT/LEFT HEART CATH AND CORONARY ANGIOGRAPHY N/A 12/27/2016   Procedure: RIGHT/LEFT HEART CATH AND CORONARY ANGIOGRAPHY;  Surgeon: Leonie Man, MD;  Location: [place] INVASIVE CV LAB: Mild-moderate pulmonary hypertension.  Hyperdynamic ventricle noted.  EF 55-65% -hyperdynamic (unable to measure LVOT gradient).  Mildly elevated  very tortuous but angiographically normal coronary arteries, suggesting hypertensive heart disease   SHOULDER ARTHROSCOPY WITH ROTATOR CUFF REPAIR Right 1990s?   TONSILLECTOMY  1951   TRANSTHORACIC ECHOCARDIOGRAM  01/05/2017    EF 60-65% with dynamic outflow tract obstruction at rest. Peak gradient 52 mmHg consistent with HOCM.  GRII DD area. Aortic sclerosis but no stenosis. Systolic anterior motion of mitral valve chordae. Mild mitral stenosis. Moderate LA dilation and mild RA dilation. Peak PA pressures 35 mmHg.   TRANSTHORACIC ECHOCARDIOGRAM  01/08/2020   [facility]: EF 55 to 60%.  GR 1 DD.  Nodularity calcification.  Trivial AS/AR.  Mild MR   VENTRAL HERNIA REPAIR N/A 08/13/2016   Procedure: LAPAROSCOPIC VENTRAL HERNIA REPAIR WITH MESH;  Surgeon: Judeth Horn, MD;  Location: [place];  Service: General;  Laterality: N/A;    Allergies  No Known Allergies  History of Present Illness    71 year old female with the above past medical history including paroxysmal atrial fibrillation, nonobstructive CAD, hypertension, hyperlipidemia, tobacco abuse, alcohol use, HCM, diastolic dysfunction, anxiety, depression, COPD, peripheral arterial disease, and cryptogenic stroke.  She previously underwent diagnostic catheterization in August 2018, showing minor irregularities in the LAD, obtuse marginal branches, and RPDA, with normal LV function.  She was medically managed.  Echo in August 2021, showed normal LV function with mild diastolic dysfunction, and mild much regurgitation.  In February 2022, she was mated to [facility] with right-sided facial numbness and blurred vision.  CT showed infarct involving the caudate nucleus.  TEE was not performed.  She subsequently underwent implantable loop recorder in March 2022.  May 2022, in the setting of claudication, she underwent peripheral arteriography, revealing severe bilateral lower extremity disease with staged PTA and stenting of the left lower extremity on May 12, followed  by PTA and stenting of the right lower extremity on May 19.  Follow-up ABI in June 2022 was normal.  In July 2022, she was found to have paroxysmal atrial fibrillation on her loop recorder, and was placed on oral anticoagulation.  She was seen by electrophysiology with plan for continuation of beta-blocker and oral anticoagulation therapy and consideration for antiarrhythmic if burden of A-fib increases in the future.  Ms. Hostler was last seen in cardiology clinic in June 2023.  At that time, she was doing reasonably well and was euvolemic.  In early November, she was walking into her home and lost her balance.  She fell to the sidewalk and then says she rolled into the ditch.  She did need some assistance getting up.  Since then, she has had a hematoma on the right buttock.  She was recently seen in the emergency department in mid November with 6-week history of dyspnea worse with exertion.  She was noted to have wheezing on exam.  Chest x-ray was clear.  Lab work was relatively unremarkable.  She was discharged with prednisone taper and albuterol  inhaler.  She says that her right buttock was evaluated and confirmed that she had a large hematoma, and she was simply advised that it would take a while to heal.  Since then, her breathing has improved significantly.  She does not experience dyspnea on exertion but also admits to doing very little at home.  She denies chest pain, palpitations, PND, orthopnea, dizziness, syncope, edema, or early satiety.  She still has a large right buttock hematoma and has noted some skin breakdown and occasional bleeding in the setting of Eliquis, Plavix, and Pletal therapy.  Home Medications    Current Outpatient Medications  Medication Sig Dispense Refill   albuterol (VENTOLIN HFA) 108 (90 Base) MCG/ACT inhaler Inhale 2 puffs into the lungs every 6 (six) hours as needed for wheezing or shortness of breath. 8 g 2   apixaban (ELIQUIS) 5 MG TABS tablet TAKE 1 TABLET BY MOUTH   TWICE DAILY 180 tablet 3   atorvastatin (LIPITOR) 40 MG tablet TAKE 1 TABLET BY MOUTH DAILY 100 tablet 2   buPROPion (WELLBUTRIN XL) 150 MG 24 hr tablet TAKE 1 TABLET BY MOUTH  DAILY 90 tablet 3   busPIRone (BUSPAR) 15 MG tablet TAKE 1 TABLET BY MOUTH  TWICE DAILY 180 tablet 1   carvedilol (COREG) 12.5 MG tablet Takes 2 tablets in the AM and 1 tablet in the PM     celecoxib (CELEBREX) 100 MG capsule TAKE 1 CAPSULE BY MOUTH  DAILY 100 capsule 2   cilostazol (PLETAL) 50 MG tablet TAKE 1 TABLET BY MOUTH  TWICE DAILY 180 tablet 3   clopidogrel (PLAVIX) 75 MG tablet TAKE 1 TABLET BY MOUTH  DAILY 90 tablet 3   escitalopram (LEXAPRO) 10 MG tablet TAKE 1 TABLET BY MOUTH  DAILY 90 tablet 3   folic acid (FOLVITE) 1 MG tablet Take 1 tablet (1 mg total) by mouth daily. 30 tablet 0   furosemide (LASIX) 40 MG tablet TAKE 1 TABLET BY MOUTH DAILY 100 tablet 2   gabapentin (NEURONTIN) 100 MG capsule TAKE 2 CAPSULES BY MOUTH IN THE MORNING , 1 CAPSULE IN  THE AFTERNOON AND 2  CAPSULES AT NIGHT 450 capsule 3   hydrOXYzine (VISTARIL) 25 MG capsule TAKE 1 CAPSULE BY MOUTH EVERY 8  HOURS AS NEEDED 180 capsule 1   ibandronate (BONIVA) 150 MG tablet TAKE 1 TABLET BY MOUTH  MONTHLY WITH 8 OZ OF PLAIN  WATER 30 MINUTES BEFORE  FIRST FOOD, DRINK OR MEDS.  STAY UPRIGHT FOR 30 MINS 3 tablet 3   meclizine (ANTIVERT) 25 MG tablet Take 1 tablet (25 mg total) by mouth 3 (three) times daily as needed for dizziness. 30 tablet 0   Multiple Vitamin (MULTIVITAMIN WITH MINERALS) TABS tablet Take 1 tablet by mouth daily.     pantoprazole (PROTONIX) 40 MG tablet TAKE 1 TABLET BY MOUTH  DAILY 90 tablet 3   tiZANidine (ZANAFLEX) 2 MG tablet TAKE 1 TABLET BY MOUTH  EVERY 8 HOURS AS NEEDED FOR MUSCLE SPASM(S) 270 tablet 1   traZODone (DESYREL) 100 MG tablet TAKE 1 TABLET BY MOUTH  DAILY AT BEDTIME 90 tablet 3   valsartan (DIOVAN) 160 MG tablet TAKE 1 TABLET BY MOUTH  DAILY 90 tablet 3   fenofibrate micronized (LOFIBRA) 134 MG capsule Take  1 capsule (134 mg total) by mouth daily before breakfast. 90 capsule 3   No current facility-administered medications for this visit.     Review of Systems    As above, she has been  dealing with a hematoma of her right buttock with mild bleeding related to skin breakdown.  She denies chest pain, dyspnea, palpitations, PND, orthopnea, dizziness, syncope, edema, or early satiety.  All other systems reviewed and are otherwise negative except as noted above.    Physical Exam    VS:  BP 104/76 (BP Location: Left Arm, Patient Position: Sitting, Cuff Size: Large)   Pulse (!) 57   Ht 5' (1.524 m)   Wt 176 lb (79.8 kg)   SpO2 98%   BMI 34.37 kg/m  , BMI Body mass index is 34.37 kg/m.     GEN: Well nourished, well developed, in no acute distress. HEENT: normal. Neck: Supple, no JVD, carotid bruits, or masses. Cardiac: RRR, 2/6 systolic ejection murmur at the upper sternal borders, 3/6 systolic ejection murmur at the lower sternal borders to the apex, no rubs or gallops. No clubbing, cyanosis, edema.  Radials 2+/PT 2+ and equal bilaterally.  Respiratory:  Respirations regular and unlabored, clear to auscultation bilaterally. GI: Soft, nontender, nondistended, BS + x 4. MS: no deformity or atrophy. Skin: warm and dry, no rash. Neuro:  Strength and sensation are intact. Psych: Normal affect.  Accessory Clinical Findings    ECG personally reviewed by me today -sinus bradycardia, 57, PACs - no acute changes.  Lab Results  Component Value Date   WBC 9.0 04/27/2022   HGB 14.9 04/27/2022   HCT 44.7 04/27/2022   MCV 95.9 04/27/2022   PLT 175 04/27/2022   Lab Results  Component Value Date   CREATININE 0.67 04/08/2022   BUN 9 04/08/2022   NA 141 04/08/2022   K 3.2 (L) 04/08/2022   CL 105 04/08/2022   CO2 26 04/08/2022   Lab Results  Component Value Date   ALT 16 06/29/2021   AST 22 06/29/2021   ALKPHOS 50 06/29/2021   BILITOT 0.7 06/29/2021   Lab Results  Component Value Date    CHOL 210 (H) 04/27/2022   HDL 54 04/27/2022   LDLCALC 91 04/27/2022   LDLDIRECT 66.0 10/20/2015   TRIG 327 (H) 04/27/2022   CHOLHDL 3.9 04/27/2022    Lab Results  Component Value Date   HGBA1C 5.1 07/05/2020    Assessment & Plan    1.  Paroxysmal atrial fibrillation: Maintaining sinus rhythm today.  No recent palpitations or known paroxysms.  She remains on beta-blocker and Eliquis.  2.  Essential hypertension: Stable on beta-blocker and ARB.  3.  Hyperlipidemia: LDL of 91 on evaluation today.  She was 98 in August 2022.  She is on atorvastatin therapy and will benefit from closer monitoring of dietary intake of fat and cholesterol, as well as weight loss.  4.  Nonobstructive CAD/atypical arm pain: Diagnostic catheterization August 2018 showed minor irregularities in the LAD, obtuse marginal, and RPDA.  She has not been have any chest or arm pain, and breathing stable.  She remains on beta-blocker and statin therapy.  No aspirin in setting of chronic Eliquis.  5.  Peripheral arterial disease: Status post bilateral lower extremity angioplasty and stenting in May 2022.  She is followed by vascular surgery with plans for repeat imaging and office follow-up in the coming months.  6.  Tobacco abuse: She continues to smoke 1 pack of cigarettes per day.  She is not currently contemplating quitting.  Complete cessation advised.  7.  History of stroke: Overall stable.  She remains on statin and Eliquis therapy.  8.  Right buttock hematoma: Patient lost her balance and  fell outside her home about a month ago, resulting in a large hematoma of her right buttock.  This was previous evaluated in the emergency department in November and she was told that it would simply take a long time to resolve.  She continues to have a right buttock hematoma measuring about 4 cm x 2-1/2 cm.  There has been some skin breakdown and slow oozing of blood at the site.  She has been covering with Band-Aids.  We  discussed appropriate wound care and relief of pressure from the area which will be necessary for healing.  She has to clean at least twice a day with a half and half saline solution and hydrogen peroxide mix, leave open to air as often as possible, and be sure to keep pressure off of that side when lying or sitting.  She understands that if bleeding worsens or hematoma becomes larger, she should seek follow-up sooner.  I did obtain a CBC today which is normal.  9.  HOCM: Loud systolic murmur on exam.  26 mmHg gradient on echo in 2017 though most recent echo in August 2021, which was read by an outside cardiologist, makes no mention of outflow tract gradient with moderate LVH.  She denies dyspnea on exertion, presyncope, or syncope.  Heart rate and blood pressure well-controlled.  10.  Disposition: Follow-up in clinic in 6 months or sooner if necessary.    Murray Hodgkins, NP 04/27/2022, 12:37 PM

## 2022-04-27 NOTE — Patient Instructions (Addendum)
Medication Instructions:  No changes *If you need a refill on your cardiac medications before your next appointment, please call your pharmacy*   Lab Work: Complete blood count and lipid panel at the medical mall today If you have labs (blood work) drawn today and your tests are completely normal, you will receive your results only by: [place] (if you have MyChart) OR A paper copy in the mail If you have any lab test that is abnormal or we need to change your treatment, we will call you to review the results.  Medical Mall Entrance at [facility] 1st desk on the right to check in (REGISTRATION)  Lab hours: Monday- Friday (7:30 am- 5:30 pm)   Testing/Procedures: None   Follow-Up: At [facility], you and your health needs are our priority.  As part of our continuing mission to provide you with exceptional heart care, we have created designated Provider Care Teams.  These Care Teams include your primary Cardiologist (physician) and Advanced Practice Providers (APPs -  Physician Assistants and Nurse Practitioners) who all work together to provide you with the care you need, when you need it.  We recommend signing up for the patient portal called "MyChart".  Sign up information is provided on this After Visit Summary.  MyChart is used to connect with patients for Virtual Visits (Telemedicine).  Patients are able to view lab/test results, encounter notes, upcoming appointments, etc.  Non-urgent messages can be sent to your provider as well.   To learn more about what you can do with MyChart, go to [email or web address].    Your next appointment:   3 month(s)  The format for your next appointment:   In Person  Provider:   Glenetta Hew, MD or Murray Hodgkins, NP    Other Instructions Keep R buttocks clean (1/2 water or saline and 1/2 hydrogen peroxide at least 2x/day) and dry.  Important Information About Sugar

## 2022-04-30 ENCOUNTER — Ambulatory Visit: Payer: Medicare Other | Admitting: Internal Medicine

## 2022-05-01 ENCOUNTER — Ambulatory Visit (INDEPENDENT_AMBULATORY_CARE_PROVIDER_SITE_OTHER): Payer: Medicare Other | Admitting: Internal Medicine

## 2022-05-01 ENCOUNTER — Encounter: Payer: Self-pay | Admitting: Internal Medicine

## 2022-05-01 VITALS — BP 157/111 | HR 78 | Temp 98.0°F | Resp 16 | Ht 63.0 in | Wt 175.2 lb

## 2022-05-01 DIAGNOSIS — J449 Chronic obstructive pulmonary disease, unspecified: Secondary | ICD-10-CM | POA: Diagnosis not present

## 2022-05-01 DIAGNOSIS — R942 Abnormal results of pulmonary function studies: Secondary | ICD-10-CM

## 2022-05-01 DIAGNOSIS — W19XXXS Unspecified fall, sequela: Secondary | ICD-10-CM

## 2022-05-01 DIAGNOSIS — T148XXA Other injury of unspecified body region, initial encounter: Secondary | ICD-10-CM

## 2022-05-01 DIAGNOSIS — I480 Paroxysmal atrial fibrillation: Secondary | ICD-10-CM | POA: Diagnosis not present

## 2022-05-01 MED ORDER — HYDROXYZINE PAMOATE 25 MG PO CAPS: ORAL_CAPSULE | ORAL | 1 refills | Status: AC

## 2022-05-01 NOTE — Progress Notes (Signed)
[facility] [place],  [place] [id number]  Internal MEDICINE  Office Visit Note  Patient Name: Sarah Barnes  [id number]  [id number]  Date of Service: 05/01/2022  Chief Complaint  Patient presents with   Follow-up   Depression   Gastroesophageal Reflux   Hypertension   Hyperlipidemia   Medication Refill    Hydroxyzine refill   Fall    Patient fell 2 days ago and feels she may have broken a rib    HPI Patient is here for routine follow-up Since her previous visit the patient has been in the ED twice 1 after a fall on her right side, right side developing hematoma on right, since then it has been bleeding Her second the ED visit was 11/19 and was for COPD exacerbation, she did not require hospital admission, she only uses her oxygen at night however not adherent with therapy Patient also has abnormal CT and PET scan lung, she has canceled her appointment 3 times Patient sustained another fall 2 days ago and fell on her left side complains of ribs pain sore She denies any chest pain shortness of breath but not steady on her feet    Current Medication: Outpatient Encounter Medications as of 05/01/2022  Medication Sig   albuterol (VENTOLIN HFA) 108 (90 Base) MCG/ACT inhaler Inhale 2 puffs into the lungs every 6 (six) hours as needed for wheezing or shortness of breath.   apixaban (ELIQUIS) 5 MG TABS tablet TAKE 1 TABLET BY MOUTH  TWICE DAILY   atorvastatin (LIPITOR) 40 MG tablet TAKE 1 TABLET BY MOUTH DAILY   buPROPion (WELLBUTRIN XL) 150 MG 24 hr tablet TAKE 1 TABLET BY MOUTH  DAILY   busPIRone (BUSPAR) 15 MG tablet TAKE 1 TABLET BY MOUTH  TWICE DAILY   carvedilol (COREG) 12.5 MG tablet Takes 2 tablets in the AM and 1 tablet in the PM   celecoxib (CELEBREX) 100 MG capsule TAKE 1 CAPSULE BY MOUTH  DAILY   cilostazol (PLETAL) 50 MG tablet TAKE 1 TABLET BY MOUTH  TWICE DAILY   clopidogrel (PLAVIX) 75 MG tablet TAKE 1 TABLET BY MOUTH  DAILY    escitalopram (LEXAPRO) 10 MG tablet TAKE 1 TABLET BY MOUTH  DAILY   fenofibrate micronized (LOFIBRA) 134 MG capsule Take 1 capsule (134 mg total) by mouth daily before breakfast.   folic acid (FOLVITE) 1 MG tablet Take 1 tablet (1 mg total) by mouth daily.   furosemide (LASIX) 40 MG tablet TAKE 1 TABLET BY MOUTH DAILY   gabapentin (NEURONTIN) 100 MG capsule TAKE 2 CAPSULES BY MOUTH IN THE MORNING , 1 CAPSULE IN  THE AFTERNOON AND 2  CAPSULES AT NIGHT   ibandronate (BONIVA) 150 MG tablet TAKE 1 TABLET BY MOUTH  MONTHLY WITH 8 OZ OF PLAIN  WATER 30 MINUTES BEFORE  FIRST FOOD, DRINK OR MEDS.  STAY UPRIGHT FOR 30 MINS   Multiple Vitamin (MULTIVITAMIN WITH MINERALS) TABS tablet Take 1 tablet by mouth daily.   pantoprazole (PROTONIX) 40 MG tablet TAKE 1 TABLET BY MOUTH  DAILY   traZODone (DESYREL) 100 MG tablet TAKE 1 TABLET BY MOUTH  DAILY AT BEDTIME   valsartan (DIOVAN) 160 MG tablet TAKE 1 TABLET BY MOUTH  DAILY   [DISCONTINUED] hydrOXYzine (VISTARIL) 25 MG capsule TAKE 1 CAPSULE BY MOUTH EVERY 8  HOURS AS NEEDED   [DISCONTINUED] meclizine (ANTIVERT) 25 MG tablet Take 1 tablet (25 mg total) by mouth 3 (three) times daily as needed for dizziness.   [  DISCONTINUED] tiZANidine (ZANAFLEX) 2 MG tablet TAKE 1 TABLET BY MOUTH  EVERY 8 HOURS AS NEEDED FOR MUSCLE SPASM(S)   hydrOXYzine (VISTARIL) 25 MG capsule Take one tab po qd prn for anxiety   No facility-administered encounter medications on file as of 05/01/2022.    Surgical History: Past Surgical History:  Procedure Laterality Date   ANAL FISTULECTOMY  1970s X 3   BACK SURGERY     Cardiac MRI     Hypertrophic cardiomyopathy with septal thickness of 19 mm. EF 72%, moderate MR. . Moderate LAD. Partial fusion of right and left cusps of the aortic valve- no stenosis. LVOT turbulence , transient SAM   COLONOSCOPY WITH PROPOFOL N/A 04/24/2021   Procedure: COLONOSCOPY WITH PROPOFOL;  Surgeon: [name], MD;  Location: [facility] ENDOSCOPY;  Service:  Gastroenterology;  Laterality: N/A;   COLONOSCOPY WITH PROPOFOL N/A 06/15/2021   Procedure: COLONOSCOPY WITH PROPOFOL;  Surgeon: [name], MD;  Location: [facility] ENDOSCOPY;  Service: Gastroenterology;  Laterality: N/A;   FRACTURE SURGERY     HERNIA REPAIR     KNEE ARTHROSCOPY Right    LAPAROSCOPIC CHOLECYSTECTOMY     LAPAROSCOPIC INCISIONAL / UMBILICAL / VENTRAL HERNIA REPAIR  08/13/2016   [place] w/mesh   LOWER EXTREMITY ANGIOGRAPHY Left 09/29/2020   Procedure: LOWER EXTREMITY ANGIOGRAPHY;  Surgeon: Algernon Huxley, MD;  Location: [facility] CV LAB;  Service: Cardiovascular;  Laterality: Left;   LOWER EXTREMITY ANGIOGRAPHY Right 10/06/2020   Procedure: LOWER EXTREMITY ANGIOGRAPHY;  Surgeon: Algernon Huxley, MD;  Location: [facility] CV LAB;  Service: Cardiovascular;  Laterality: Right;   LUMBAR DISC SURGERY     "herniated disc"   NASAL FRACTURE SURGERY  1970s X 2   RIGHT/LEFT HEART CATH AND CORONARY ANGIOGRAPHY N/A 12/27/2016   Procedure: RIGHT/LEFT HEART CATH AND CORONARY ANGIOGRAPHY;  Surgeon: Leonie Man, MD;  Location: [facility] INVASIVE CV LAB: Mild-moderate pulmonary hypertension.  Hyperdynamic ventricle noted.  EF 55-65% -hyperdynamic (unable to measure LVOT gradient).  Mildly elevated very tortuous but angiographically normal coronary arteries, suggesting hypertensive heart disease   SHOULDER ARTHROSCOPY WITH ROTATOR CUFF REPAIR Right 1990s?   TONSILLECTOMY  1951   TRANSTHORACIC ECHOCARDIOGRAM  01/05/2017    EF 60-65% with dynamic outflow tract obstruction at rest. Peak gradient 52 mmHg consistent with HOCM.  GRII DD area. Aortic sclerosis but no stenosis. Systolic anterior motion of mitral valve chordae. Mild mitral stenosis. Moderate LA dilation and mild RA dilation. Peak PA pressures 35 mmHg.   TRANSTHORACIC ECHOCARDIOGRAM  01/08/2020   [place]OVA: EF 55 to 60%.  GR 1 DD.  Nodularity calcification.  Trivial AS/AR.  Mild MR   VENTRAL HERNIA REPAIR N/A 08/13/2016   Procedure: LAPAROSCOPIC  VENTRAL HERNIA REPAIR WITH MESH;  Surgeon: Judeth Horn, MD;  Location: [facility];  Service: General;  Laterality: N/A;    Medical History: Past Medical History:  Diagnosis Date   Agoraphobia with panic disorder    Anemia    Arthritis    "all over" (08/13/2016)   Chronic bronchitis ([place])    COPD (chronic obstructive pulmonary disease) ([place])    Cryptogenic stroke ([place])    a. 06/2020 Head CT: low density caudate nucleus concerning for infarct; b. 07/2020 s/p MDT Linq; c. 11/2020 Finding of Afib on Linq.   Depression    ETOH abuse    GAD (generalized anxiety disorder)    GERD (gastroesophageal reflux disease)    Headache    "daily til I got glasses; now have headache once in awhile" (08/13/2016)  Hepatitis 1960   "isolated &  hospitalized for 2 weeks; don't know which kind of hepatitis"    Hernia, hiatal    High cholesterol    Hypertension    Hypertrophic obstructive cardiomyopathy ([place]) 12/2016   Echo showed increased LV wall thickness with grade 2 mmHg LVOT gradient and SAM along with GRII DD.  Consistent with HOCM.   Mild aortic stenosis    a. 01/2019 Echo: EF 55-60%. Mild AS; b. 12/2019 Echo: Nl EF. Mild diast dysfxn. Trace AS/AI/TR. Mild MR.   Nonobstructive CAD (coronary artery disease)    a. 12/2016 Cath: LM nl, LAD min irregs, LCX nl, OM1/2 min irregs, RCA nl, RPDA min irregs. EF 55-65%.   PAD (peripheral artery disease) ([place])    a. 09/29/2020 s/p PTA/DBA to L prox/mid SFA and stenting of L SFA; b. 10/06/2020 PTA of R TP trunk and prox peroenal. PTA/DBA of R SFA and prox R Popliteal. R SFA stenting x 2; c. 10/2020 ABI: R 0.92, L 0.93.   PAF (paroxysmal atrial fibrillation) ([place])    a.11/2020 PAF noted on Linq; b. CHA2DS2VASc = 6-->eliquis.   Pneumonia    "once or twice" (08/13/2016)   PONV (postoperative nausea and vomiting)    Sciatica    Tobacco abuse     Family History: Family History  Problem Relation Age of Onset   Diabetes Mother    Hyperlipidemia Mother    Hypertension  Mother    Heart attack Mother 59   Angina Mother        chronic problem   Hypertension Father    Stroke Father    Cancer Brother     Social History   Socioeconomic History   Marital status: Single    Spouse name: Not on file   Number of children: Not on file   Years of education: Not on file   Highest education level: Not on file  Occupational History   Not on file  Tobacco Use   Smoking status: Every Day    Packs/day: 0.50    Years: 49.00    Total pack years: 24.50    Types: Cigarettes   Smokeless tobacco: Former   Tobacco comments:    1/2 pack daily  Vaping Use   Vaping Use: Former  Substance and Sexual Activity   Alcohol use: Not Currently    Alcohol/week: 84.0 standard drinks of alcohol    Types: 36 Cans of beer, 48 Shots of liquor per week    Comment: couple of times a week. Had a beer today   Drug use: Not Currently    Frequency: 1.0 times per week    Types: Marijuana, Cocaine    Comment: Cocaine + March 2017   Sexual activity: Not Currently  Other Topics Concern   Not on file  Social History Narrative   Saretta has generalized anxiety disorder and clearcut Agoraphobia.   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    She currently lives with her sister.   Currently denies substance abuse beyond Tobacco (1-1/2 PPD) & EtOH (beer, vodka) - not ready to discuss quitting.   Social Determinants of Health   Financial Resource Strain: Not on file  Food Insecurity: Not on file  Transportation Needs: Not on file  Physical Activity: Not on file  Stress: Not on file  Social Connections: Not on file  Intimate Partner Violence:  Not on file      Review of Systems  Constitutional:  Negative for fatigue and fever.  HENT:  Negative for congestion, mouth sores and postnasal drip.   Respiratory:  Negative for cough.   Cardiovascular:  Negative for chest pain.   Genitourinary:  Negative for flank pain.  Musculoskeletal:  Positive for back pain and gait problem.  Skin:  Positive for color change and wound.       Right buttock hematoma, firm to touch, leaking   Psychiatric/Behavioral: Negative.      Vital Signs: BP (!) 157/111   Pulse 78   Temp 98 F (36.7 C)   Resp 16   Ht '5\' 3"'$  (1.6 m)   Wt 175 lb 3.2 oz (79.5 kg)   SpO2 97%   BMI 31.04 kg/m    Physical Exam Constitutional:      Appearance: Normal appearance.  HENT:     Head: Normocephalic and atraumatic.     Nose: Nose normal.     Mouth/Throat:     Mouth: Mucous membranes are moist.     Pharynx: No posterior oropharyngeal erythema.  Eyes:     Extraocular Movements: Extraocular movements intact.     Pupils: Pupils are equal, round, and reactive to light.  Cardiovascular:     Pulses: Normal pulses.     Heart sounds: Normal heart sounds.  Pulmonary:     Effort: Pulmonary effort is normal.     Breath sounds: Normal breath sounds.     Comments: Left lower rib cage  Chest:     Chest wall: Tenderness present.  Skin:    Findings: Bruising present.     Comments: Right buttock hematoma, firm to touch, leaking   Neurological:     General: No focal deficit present.     Mental Status: She is alert.  Psychiatric:        Mood and Affect: Mood normal.        Behavior: Behavior normal.        Assessment/Plan: 1. Chronic obstructive pulmonary disease, unspecified COPD type ([place]) Patient is encouraged to take all her medication as prescribed and to use her oxygen on a regular basis, might need overnight pulse oximetry - AMB Referral to [place] (ACO Patients)  2. Hematoma and contusion This is acute problem continues to bleed. Will need to see surgery as soon as possible - AMB Referral to [place] (ACO Patients) - Ambulatory referral to General Surgery  3. Abnormal PET scan of lung She has canceled few of her appointments to have  follow-up to discuss abnormal PET scan, patient is most likely will need tissue biopsy will speak with pulmonary for further recommendations - AMB Referral to [place] (ACO Patients)  4. Fall, sequela Multiple falls she will get benefit from PT gait and balance training - AMB Referral to [place] (ACO Patients)  5. Paroxysmal atrial fibrillation (H[facility]) Followed by pulmonary, she is on Eliquis - AMB Referral to [place] (ACO Patients)   General Counseling: Yazaira verbalizes understanding of the findings of todays visit and agrees with plan of treatment. I have discussed any further diagnostic evaluation that may be needed or ordered today. We also reviewed her medications today. she has been encouraged to call the office with any questions or concerns that should arise related to todays visit.    Orders Placed This Encounter  Procedures   AMB Referral to [place] (ACO Patients)   Ambulatory  referral to General Surgery      Total time spent:35 Minutes Time spent includes review of chart, medications, test results, and follow up plan with the patient.   [place] Controlled Substance Database was reviewed by me.   Dr [name] Internal medicine

## 2022-05-03 ENCOUNTER — Ambulatory Visit: Payer: Self-pay

## 2022-05-03 ENCOUNTER — Telehealth: Payer: Self-pay | Admitting: *Deleted

## 2022-05-03 NOTE — Patient Outreach (Signed)
Care Coordination   Initial Visit Note   05/03/2022 Name: Sarah Barnes MRN: [id number] DOB: 11-27-1950  Sarah Barnes is a 71 y.o. year old female who sees McDonough, Si Gaul, PA-C for primary care. I spoke with  Sarah Barnes by phone today.  What matters to the patients health and wellness today?  Frequent falls with hematoma development, Possible need for lung biopsy due to abnormal scans/test, transportation assistance.     Goals Addressed             This Visit's Progress    Patient Stated:  Assistance with care coordination needs       Care Coordination Interventions:  Evaluation of current treatment plan related to hematoma due to falls, abnormal CT/PET lung scan  and patient's adherence to plan as established by provider.  Patient states her doctor felt she may need a biopsy due to the results of her recent lung scans. Patient states she is scheduled to see a general surgeon on 05/04/22 due to hematoma. Patient states she has oxygen at home but does not use it at this time because she has not needed it. Reports the oxygen was prescribed approximately 1 month ago.  Referred to community resource guide for transportation assistance:  Patient states she has [employer] transportation assistance. She states she has missed a few appointments due to issues with the [facility] transportation.  Patient state she does not have any family members that can transport her to appointments.   Reviewed medications with patient and discussed: Patient reports losing site in 1/2 of her right eye due to history of stroke.  She reports being able to manage medications on her own.   Patient states she is able to obtain her medications and takes them as prescribed.  Reviewed scheduled/upcoming provider appointments: Per chart review scheduled visit noted with general surgeon on 05/04/22.  Discussed plans with patient for ongoing care management follow up and provided patient with direct contact  information for care management team:  Next scheduled telephone visit with RNCM is 05/09/22.  Discussed and offered patient care coordination services:  Patient verbally agreed to care coordination services/ follow up  Sent patient education information regarding fall precautions:  Patient reports having 3 falls within the past 1 1/2 month.   She reports having an ED visit for one of the falls. She states she developed a hematoma that has "popped" and continues to bleed.  Patient reports her doctor is aware and has referred he to a general surgeon that she will see on tomorrow 05/04/22. Patient reports she loses her balance.  Reports using cane for ambulation assistance.    Discussed provider recommendation for home physical therapy.  Message sent to primary care provider/ Dr. Humphrey Rolls request referral to be sent for home health services: Patient verbally agreed to having home physical therapy evaluation.   She states she is concerned about someone coming to her home because her home is [place] [employer]."  She reports having 3 cats and 2 dogs.            SDOH assessments and interventions completed:  Yes  SDOH Interventions Today    Flowsheet Row Most Recent Value  SDOH Interventions   Food Insecurity Interventions Intervention Not Indicated  Housing Interventions Intervention Not Indicated  Transportation Interventions Intervention Not Indicated        Care Coordination Interventions:  Yes, provided   Follow up plan: Follow up call scheduled for 05/09/22    Encounter  Outcome:  Pt. Visit Completed   Quinn Plowman RN,BSN,CCM [place] [phone] direct line

## 2022-05-03 NOTE — Progress Notes (Signed)
  Care Coordination   Note   05/03/2022 Name: Sarah Barnes MRN: [id number] DOB: [birth year]  Sarah Barnes is a 71 y.o. year old female who sees McDonough, Si Gaul, PA-C for primary care. I reached out to Sarah Barnes by phone today to offer care coordination services.  Sarah Barnes was given information about Care Coordination services today including:   The Care Coordination services include support from the care team which includes your Nurse Coordinator, Clinical Social Worker, or Pharmacist.  The Care Coordination team is here to help remove barriers to the health concerns and goals most important to you. Care Coordination services are voluntary, and the patient may decline or stop services at any time by request to their care team member.   Care Coordination Consent Status: Patient agreed to services and verbal consent obtained.   Follow up plan:  Telephone appointment with care coordination team member scheduled for:  05/03/2022  Encounter Outcome:  Pt. Scheduled from referral   Julian Hy, [place] Direct Dial: [phone]

## 2022-05-03 NOTE — Patient Instructions (Signed)
Visit Information  Thank you for taking time to visit with me today. Please don't hesitate to contact me if I can be of assistance to you.   Following are the goals we discussed today:   Goals Addressed             This Visit's Progress    Patient Stated:  Assistance with care coordination needs       Care Coordination Interventions:  Evaluation of current treatment plan related to hematoma due to falls, abnormal CT/PET lung scan  and patient's adherence to plan as established by provider.  Patient states her doctor felt she may need a biopsy due to the results of her recent lung scans. Patient states she is scheduled to see a general surgeon on 05/04/22 due to hematoma. Patient states she has oxygen at home but does not use it at this time because she has not needed it. Reports the oxygen was prescribed approximately 1 month ago.  Referred to community resource guide for transportation assistance:  Patient states she has [employer] transportation assistance. She states she has missed a few appointments due to issues with the [facility] transportation.  Patient state she does not have any family members that can transport her to appointments.   Reviewed medications with patient and discussed: Patient reports losing site in 1/2 of her right eye due to history of stroke.  She reports being able to manage medications on her own.   Patient states she is able to obtain her medications and takes them as prescribed.  Reviewed scheduled/upcoming provider appointments: Per chart review scheduled visit noted with general surgeon on 05/04/22.  Discussed plans with patient for ongoing care management follow up and provided patient with direct contact information for care management team:  Next scheduled telephone visit with RNCM is 05/09/22.  Discussed and offered patient care coordination services:  Patient verbally agreed to care coordination services/ follow up  Sent patient education information regarding  fall precautions:  Patient reports having 3 falls within the past 1 1/2 month.   She reports having an ED visit for one of the falls. She states she developed a hematoma that has "popped" and continues to bleed.  Patient reports her doctor is aware and has referred he to a general surgeon that she will see on tomorrow 05/04/22. Patient reports she loses her balance.  Reports using cane for ambulation assistance.    Discussed provider recommendation for home physical therapy.  Message sent to primary care provider/ Dr. Humphrey Rolls request referral to be sent for home health services: Patient verbally agreed to having home physical therapy evaluation.   She states she is concerned about someone coming to her home because her home is [place] [employer]."  She reports having 3 cats and 2 dogs.            Our next appointment is by telephone on 05/09/22 at 3:30 pm  Please call the care guide team at [phone] if you need to cancel or reschedule your appointment.   If you are experiencing a Mental Health or [place] or need someone to talk to, please call the Suicide and Crisis Lifeline: 988 call 1-800-273-TALK (toll free, 24 hour hotline)  Patient verbalizes understanding of instructions and care plan provided today and agrees to view in [place]. Active MyChart status and patient understanding of how to access instructions and care plan via MyChart confirmed with patient.     Quinn Plowman RN,BSN,CCM [place] Coordination [phone] direct  line  Understanding Your Risk for Falls Each year, millions of people have serious injuries from falls. It is important to understand your risk for falling. Talk with your health care provider about your risk and what you can do to lower it. There are actions you can take at home to lower your risk and prevent falls. If you do have a serious fall, make sure to tell your health care provider. Falling once raises your risk of falling again. How can falls  affect me? Serious injuries from falls are common. These include: Broken bones, such as hip fractures. Head injuries, such as traumatic brain injuries (TBI) or concussion. A fear of falling can cause you to avoid activities and stay at home. This can make your muscles weaker and actually raise your risk for a fall. What can increase my risk? There are a number of risk factors that increase your risk for falling. The more risk factors you have, the higher your risk of falling. Serious injuries from a fall happen most often to people older than age 56. Children and young adults ages 44-29 are also at higher risk. Common risk factors include: Weakness in the lower body. Lack (deficiency) of vitamin D. Being generally weak or confused due to long-term (chronic) illness. Dizziness or balance problems. Poor vision. Medicines that cause dizziness or drowsiness. These can include medicines for your blood pressure, heart, anxiety, insomnia, or edema, as well as pain medicines and muscle relaxants. Other risk factors include: Drinking alcohol. Having had a fall in the past. Having depression. Having foot pain or wearing improper footwear. Working at a dangerous job. Having any of the following in your home: Tripping hazards, such as floor clutter or loose rugs. Poor lighting. Pets. Having dementia or memory loss. What actions can I take to lower my risk of falling?     Physical activity Maintain physical fitness. Do strength and balance exercises. Consider taking a regular class to build strength and balance. Yoga and tai chi are good options. Vision Have your eyes checked every year and your vision prescription updated as needed. Walking aids and footwear Wear nonskid shoes. Do not wear high heels. Do not walk around the house in socks or slippers. Use a cane or walker as told by your health care provider. Home safety Attach secure railings on both sides of your stairs. Install grab  bars for your tub, shower, and toilet. Use a bath mat in your tub or shower. Use good lighting in all rooms. Keep a flashlight near your bed. Make sure there is a clear path from your bed to the bathroom. Use night-lights. Do not use throw rugs. Make sure all carpeting is taped or tacked down securely. Remove all clutter from walkways and stairways, including extension cords. Repair uneven or broken steps. Avoid walking on icy or slippery surfaces. Walk on the grass instead of on icy or slick sidewalks. Use ice melt to get rid of ice on walkways. Use a cordless phone. Questions to ask your health care provider Can you help me check my risk for a fall? Do any of my medicines make me more likely to fall? Should I take a vitamin D supplement? What exercises can I do to improve my strength and balance? Should I make an appointment to have my vision checked? Do I need a bone density test to check for weak bones or osteoporosis? Would it help to use a cane or a walker? Where to find more information Centers for Disease  Control and Prevention, [employer]: http://www.wolf.info/ Community-Based Fall Prevention Programs: http://www.wolf.info/ National Institute on Aging: http://kim-miller.com/ Contact a health care provider if: You fall at home. You are afraid of falling at home. You feel weak, drowsy, or dizzy. Summary Serious injuries from a fall happen most often to people older than age 57. Children and young adults ages 60-29 are also at higher risk. Talk with your health care provider about your risks for falling and how to lower those risks. Taking certain precautions at home can lower your risk for falling. If you fall, always tell your health care provider. This information is not intended to replace advice given to you by your health care provider. Make sure you discuss any questions you have with your health care provider. Document Revised: 12/09/2019 Document Reviewed: 12/09/2019 Elsevier Patient Education  [place].

## 2022-05-04 ENCOUNTER — Ambulatory Visit: Payer: Medicare Other | Admitting: Surgery

## 2022-05-07 ENCOUNTER — Telehealth: Payer: Self-pay

## 2022-05-07 NOTE — Telephone Encounter (Signed)
   Telephone encounter was:  Successful.  05/07/2022 Name: [name] MRN: [id number] DOB: 12/31/1950  Sarah Barnes is a 72 y.o. year old female who is a primary care patient of McDonough, Si Gaul, PA-C . The community resource team was consulted for assistance with Transportation Needs   Care guide performed the following interventions: Spoke with patient about [employer]. Verified home address mailed [employer] application, brochure and rider guide. Emphasized to patient that she would need to call [employer] transit at least a weed prior to her appointments. [employer] does not accept last minute reservations.  Follow Up Plan:  No further follow up planned at this time. The patient has been provided with needed resources.  [place] Resource Care Guide   [email or web address]  ?? [phone]   Website: [email or web address]  [email or web address]

## 2022-05-09 ENCOUNTER — Ambulatory Visit: Payer: Self-pay

## 2022-05-09 NOTE — Patient Outreach (Signed)
  Care Coordination   Follow Up Visit Note   05/09/2022 Name: Sarah Barnes MRN: [id number] DOB: [birth year]  Sarah Barnes is a 71 y.o. year old female who sees McDonough, Si Gaul, PA-C for primary care. I spoke with  Sarah Barnes by phone today.  What matters to the patients health and wellness today?  Assistance with care coordination needs.     Goals Addressed             This Visit's Progress    Patient Stated:  Assistance with care coordination needs       Care Coordination Interventions: Confirmed patient contacted by community resource guide regarding transportation needs:  Patient confirmed she was contacted by the [employer] guide and received transportation assistance information.  Patient reports having transportation assistance for tomorrow's appointment with general surgeon.  Reviewed scheduled/upcoming provider appointments: Patient reports she missed her appointment with the general surgeon on 05/04/22 because she got the time mixed up.  She states her appointment has been rescheduled for 05/10/22.  Discussed plans with patient for ongoing care management follow up and provided patient with direct contact information for care management team:  Next scheduled telephone visit with RNCM is 05/11/22   Discussed provider recommendation for home physical therapy.  Message sent to primary care provider/ Dr. Humphrey Rolls request referral to be sent for home health services: No response received from Dr. Humphrey Rolls regarding home health PT request.  Will resubmit message request to Dr. Humphrey Rolls and Drema Dallas, PA.           SDOH assessments and interventions completed:  No     Care Coordination Interventions:  Yes, provided   Follow up plan: Follow up call scheduled for 05/11/22    Encounter Outcome:  Pt. Visit Completed   Quinn Plowman RN,BSN,CCM [place] [phone] direct line

## 2022-05-09 NOTE — Progress Notes (Unsigned)
Patient ID: Sarah Barnes, female   DOB: 18-Sep-1950, 71 y.o.   MRN: [id number]  Chief Complaint: Sequelae of traumatic hematoma proximal right posterior thigh  History of Present Illness Sarah Barnes is a 71 y.o. female with a history of utilizing 2 anticoagulants, fell while crossing a wooden bridge and fell back onto the bridge and eventually onto a pile of wood.  This happened early November, originally she reports it was remarkably bruised, had bright red bleeding from the area, subsequently darker drainage early December, diminishing to a clear drainage over the last few weeks.    Past Medical History Past Medical History:  Diagnosis Date   Agoraphobia with panic disorder    Anemia    Arthritis    "all over" (08/13/2016)   Chronic bronchitis (HCC)    COPD (chronic obstructive pulmonary disease) ([place])    Cryptogenic stroke ([place])    a. 06/2020 Head CT: low density caudate nucleus concerning for infarct; b. 07/2020 s/p MDT Linq; c. 11/2020 Finding of Afib on Linq.   Depression    ETOH abuse    GAD (generalized anxiety disorder)    GERD (gastroesophageal reflux disease)    Headache    "daily til I got glasses; now have headache once in awhile" (08/13/2016)   Hepatitis 1960   "isolated &  hospitalized for 2 weeks; don't know which kind of hepatitis"    Hernia, hiatal    High cholesterol    Hypertension    Hypertrophic obstructive cardiomyopathy ([place]) 12/2016   Echo showed increased LV wall thickness with grade 2 mmHg LVOT gradient and SAM along with GRII DD.  Consistent with HOCM.   Mild aortic stenosis    a. 01/2019 Echo: EF 55-60%. Mild AS; b. 12/2019 Echo: Nl EF. Mild diast dysfxn. Trace AS/AI/TR. Mild MR.   Nonobstructive CAD (coronary artery disease)    a. 12/2016 Cath: LM nl, LAD min irregs, LCX nl, OM1/2 min irregs, RCA nl, RPDA min irregs. EF 55-65%.   PAD (peripheral artery disease) ([place])    a. 09/29/2020 s/p PTA/DBA to L prox/mid SFA and stenting of L SFA; b. 10/06/2020 PTA  of R TP trunk and prox peroenal. PTA/DBA of R SFA and prox R Popliteal. R SFA stenting x 2; c. 10/2020 ABI: R 0.92, L 0.93.   PAF (paroxysmal atrial fibrillation) ([place])    a.11/2020 PAF noted on Linq; b. CHA2DS2VASc = 6-->eliquis.   Pneumonia    "once or twice" (08/13/2016)   PONV (postoperative nausea and vomiting)    Sciatica    Tobacco abuse       Past Surgical History:  Procedure Laterality Date   ANAL FISTULECTOMY  1970s X 3   BACK SURGERY     Cardiac MRI     Hypertrophic cardiomyopathy with septal thickness of 19 mm. EF 72%, moderate MR. . Moderate LAD. Partial fusion of right and left cusps of the aortic valve- no stenosis. LVOT turbulence , transient SAM   COLONOSCOPY WITH PROPOFOL N/A 04/24/2021   Procedure: COLONOSCOPY WITH PROPOFOL;  Surgeon: [name], MD;  Location: [facility] ENDOSCOPY;  Service: Gastroenterology;  Laterality: N/A;   COLONOSCOPY WITH PROPOFOL N/A 06/15/2021   Procedure: COLONOSCOPY WITH PROPOFOL;  Surgeon: [name], MD;  Location: [facility] ENDOSCOPY;  Service: Gastroenterology;  Laterality: N/A;   FRACTURE SURGERY     HERNIA REPAIR     KNEE ARTHROSCOPY Right    LAPAROSCOPIC CHOLECYSTECTOMY     LAPAROSCOPIC INCISIONAL / UMBILICAL / VENTRAL HERNIA REPAIR  08/13/2016  VHR w/mesh   LOWER EXTREMITY ANGIOGRAPHY Left 09/29/2020   Procedure: LOWER EXTREMITY ANGIOGRAPHY;  Surgeon: Algernon Huxley, MD;  Location: [place] CV LAB;  Service: Cardiovascular;  Laterality: Left;   LOWER EXTREMITY ANGIOGRAPHY Right 10/06/2020   Procedure: LOWER EXTREMITY ANGIOGRAPHY;  Surgeon: Algernon Huxley, MD;  Location: [place] CV LAB;  Service: Cardiovascular;  Laterality: Right;   LUMBAR DISC SURGERY     "herniated disc"   NASAL FRACTURE SURGERY  1970s X 2   RIGHT/LEFT HEART CATH AND CORONARY ANGIOGRAPHY N/A 12/27/2016   Procedure: RIGHT/LEFT HEART CATH AND CORONARY ANGIOGRAPHY;  Surgeon: Leonie Man, MD;  Location: [place] INVASIVE CV LAB: Mild-moderate pulmonary hypertension.   Hyperdynamic ventricle noted.  EF 55-65% -hyperdynamic (unable to measure LVOT gradient).  Mildly elevated very tortuous but angiographically normal coronary arteries, suggesting hypertensive heart disease   SHOULDER ARTHROSCOPY WITH ROTATOR CUFF REPAIR Right 1990s?   TONSILLECTOMY  1951   TRANSTHORACIC ECHOCARDIOGRAM  01/05/2017    EF 60-65% with dynamic outflow tract obstruction at rest. Peak gradient 52 mmHg consistent with HOCM.  GRII DD area. Aortic sclerosis but no stenosis. Systolic anterior motion of mitral valve chordae. Mild mitral stenosis. Moderate LA dilation and mild RA dilation. Peak PA pressures 35 mmHg.   TRANSTHORACIC ECHOCARDIOGRAM  01/08/2020   NOVA: EF 55 to 60%.  GR 1 DD.  Nodularity calcification.  Trivial AS/AR.  Mild MR   VENTRAL HERNIA REPAIR N/A 08/13/2016   Procedure: LAPAROSCOPIC VENTRAL HERNIA REPAIR WITH MESH;  Surgeon: Judeth Horn, MD;  Location: [place];  Service: General;  Laterality: N/A;    No Known Allergies  Current Outpatient Medications  Medication Sig Dispense Refill   albuterol (VENTOLIN HFA) 108 (90 Base) MCG/ACT inhaler Inhale 2 puffs into the lungs every 6 (six) hours as needed for wheezing or shortness of breath. 8 g 2   apixaban (ELIQUIS) 5 MG TABS tablet TAKE 1 TABLET BY MOUTH  TWICE DAILY 180 tablet 3   atorvastatin (LIPITOR) 40 MG tablet TAKE 1 TABLET BY MOUTH DAILY 100 tablet 2   buPROPion (WELLBUTRIN XL) 150 MG 24 hr tablet TAKE 1 TABLET BY MOUTH  DAILY 90 tablet 3   busPIRone (BUSPAR) 15 MG tablet TAKE 1 TABLET BY MOUTH  TWICE DAILY 180 tablet 1   carvedilol (COREG) 12.5 MG tablet Takes 2 tablets in the AM and 1 tablet in the PM     celecoxib (CELEBREX) 100 MG capsule TAKE 1 CAPSULE BY MOUTH  DAILY 100 capsule 2   cilostazol (PLETAL) 50 MG tablet TAKE 1 TABLET BY MOUTH  TWICE DAILY 180 tablet 3   clopidogrel (PLAVIX) 75 MG tablet TAKE 1 TABLET BY MOUTH  DAILY 90 tablet 3   escitalopram (LEXAPRO) 10 MG tablet TAKE 1 TABLET BY MOUTH  DAILY 90  tablet 3   fenofibrate micronized (LOFIBRA) 134 MG capsule Take 1 capsule (134 mg total) by mouth daily before breakfast. 90 capsule 3   folic acid (FOLVITE) 1 MG tablet Take 1 tablet (1 mg total) by mouth daily. 30 tablet 0   furosemide (LASIX) 40 MG tablet TAKE 1 TABLET BY MOUTH DAILY 100 tablet 2   gabapentin (NEURONTIN) 100 MG capsule TAKE 2 CAPSULES BY MOUTH IN THE MORNING , 1 CAPSULE IN  THE AFTERNOON AND 2  CAPSULES AT NIGHT 450 capsule 3   hydrOXYzine (VISTARIL) 25 MG capsule Take one tab po qd prn for anxiety 90 capsule 1   Multiple Vitamin (MULTIVITAMIN WITH MINERALS) TABS tablet  Take 1 tablet by mouth daily.     pantoprazole (PROTONIX) 40 MG tablet TAKE 1 TABLET BY MOUTH  DAILY 90 tablet 3   traZODone (DESYREL) 100 MG tablet TAKE 1 TABLET BY MOUTH  DAILY AT BEDTIME 90 tablet 3   valsartan (DIOVAN) 160 MG tablet TAKE 1 TABLET BY MOUTH  DAILY 90 tablet 3   No current facility-administered medications for this visit.    Family History Family History  Problem Relation Age of Onset   Diabetes Mother    Hyperlipidemia Mother    Hypertension Mother    Heart attack Mother 25   Angina Mother        chronic problem   Hypertension Father    Stroke Father    Cancer Brother       Social History Social History   Tobacco Use   Smoking status: Every Day    Packs/day: 0.50    Years: 49.00    Total pack years: 24.50    Types: Cigarettes   Smokeless tobacco: Former   Tobacco comments:    1/2 pack daily  Vaping Use   Vaping Use: Former  Substance Use Topics   Alcohol use: Not Currently    Alcohol/week: 84.0 standard drinks of alcohol    Types: 36 Cans of beer, 48 Shots of liquor per week    Comment: couple of times a week. Had a beer today   Drug use: Not Currently    Frequency: 1.0 times per week    Types: Marijuana, Cocaine    Comment: Cocaine + March 2017        Review of Systems  Constitutional: Negative.   HENT:  Positive for hearing loss.   Eyes: Negative.    Respiratory:  Positive for shortness of breath and wheezing.   Cardiovascular:  Positive for palpitations.  Gastrointestinal: Negative.   Genitourinary: Negative.   Skin: Negative.   Neurological: Negative.   Endo/Heme/Allergies:  Bruises/bleeds easily.  Psychiatric/Behavioral: Negative.       Physical Exam Blood pressure (!) 143/88, pulse 66, temperature 98 F (36.7 C), temperature source Oral, height 5' (1.524 m), weight 167 lb (75.8 kg), SpO2 94 %. Last Weight  Most recent update: 05/10/2022  2:06 PM    Weight  75.8 kg (167 lb)             CONSTITUTIONAL: Well developed, and nourished, appropriately responsive and aware without distress.   EYES: Sclera non-icteric.   EARS, NOSE, MOUTH AND THROAT: Mask worn.  The oropharynx is clear. Oral mucosa is pink and moist.    Hearing is intact to voice.  NECK: Trachea is midline, and there is no jugular venous distension.  LYMPH NODES:  Lymph nodes in the neck are not appreciated. RESPIRATORY:  Lungs are clear, and breath sounds are equal bilaterally. Normal respiratory effort without pathologic use of accessory muscles. CARDIOVASCULAR: Heart is regular in rate and rhythm.  Well perfused.  GI: The abdomen is soft, nontender, and nondistended. MUSCULOSKELETAL:  Symmetrical muscle tone appreciated in all four extremities.    SKIN: Skin turgor is normal. No pathologic skin lesions appreciated.   Proximal right posterior thigh there is a 1 cm opening, with a well-established linear groove of fluid collection.  Does not seem to be retaining fluid, but has indurated channel, after topical Betadine prep, I probed this opening and this channel with a sterile Q-tip and was able to find a depth of approximately 9 cm toward anterior.  There is no evidence of  purulent drainage, current cellulitic change, erythema or malodor present. NEUROLOGIC:  Motor and sensation appear grossly normal.  Cranial nerves are grossly without defect. PSYCH:  Alert  and oriented to person, place and time. Affect is appropriate for situation.  Data Reviewed I have personally reviewed what is currently available of the patient's imaging, recent labs and medical records.   Labs:     Latest Ref Rng & Units 04/27/2022   10:31 AM 04/08/2022    5:30 PM 03/23/2022    1:57 PM  CBC  WBC 4.0 - 10.5 K/uL 9.0  5.0  11.6   Hemoglobin 12.0 - 15.0 g/dL 14.9  11.4  10.1   Hematocrit 36.0 - 46.0 % 44.7  33.6  29.0   Platelets 150 - 400 K/uL 175  210  130       Latest Ref Rng & Units 04/08/2022    5:30 PM 03/23/2022    1:57 PM 06/29/2021    3:37 AM  CMP  Glucose 70 - 99 mg/dL 117  126  153   BUN 8 - 23 mg/dL '9  17  15   '$ Creatinine 0.44 - 1.00 mg/dL 0.67  0.85  0.63   Sodium 135 - 145 mmol/L 141  139  141   Potassium 3.5 - 5.1 mmol/L 3.2  3.5  3.2   Chloride 98 - 111 mmol/L 105  106  110   CO2 22 - 32 mmol/L '26  24  24   '$ Calcium 8.9 - 10.3 mg/dL 9.0  8.9  8.2   Total Protein 6.5 - 8.1 g/dL   6.6   Total Bilirubin 0.3 - 1.2 mg/dL   0.7   Alkaline Phos 38 - 126 U/L   50   AST 15 - 41 U/L   22   ALT 0 - 44 U/L   16       Imaging: Radiological images reviewed:   Within last 24 hrs: No results found.  Assessment    Draining hematoma right posterior proximal thigh. Patient Active Problem List   Diagnosis Date Noted   Paroxysmal atrial fibrillation ([place]) 12/09/2021   Hypercoagulable state due to paroxysmal atrial fibrillation ([place]); CHA2DS2-Vasc: 6 12/09/2021   OSA (obstructive sleep apnea) 01/03/2021   Other hypersomnia 01/03/2021   Atherosclerosis of native arteries of extremity with intermittent claudication ([place]) 09/13/2020   Stroke ([place]) 07/04/2020   COPD (chronic obstructive pulmonary disease) (HCC)    HLD (hyperlipidemia)    Chronic diastolic CHF (congestive heart failure) ([place])    CKD (chronic kidney disease), stage IIIb    Encounter for general adult medical examination with abnormal findings 02/09/2020   Vitamin D deficiency 02/09/2020    Dysuria 02/09/2020   Bilateral leg pain 11/28/2019   Alcohol use disorder    Hypertrophic obstructive cardiomyopathy ([place])    Syncope 12/25/2016   Heart palpitations - PACs 12/24/2016   Shortness of breath at rest 12/24/2016   Coronary artery disease, non-occlusive 12/2016   Ventral hernia 08/13/2016   Agitation 08/11/2015   Panic disorder with agoraphobia 07/13/2015   Tobacco use disorder 07/12/2015   Substance abuse ([place]) 07/12/2015   Generalized anxiety disorder 05/17/2014   Hypertriglyceridemia 05/05/2014   GERD (gastroesophageal reflux disease) 05/05/2014   Essential hypertension 05/05/2014    Plan    Considering her blood thinning, we discussed the role of a wider incision and drainage, at present it appears there is no retention of this draining fluid, there is no evidence of infection, it appears this drainage  tract is well-established.  I think at present it is also relatively easy to care for.  So therefore I do not believe intervention is in her best interest at this time.  We encouraged her to continue it keep it clean and dry and apply a dry cover dressing to protect her clothing.  Will be glad to see her back in a couple weeks to see what kind of progress it is making.  Face-to-face time spent with the patient and accompanying care providers(if present) was 30 minutes, with more than 50% of the time spent counseling, educating, and coordinating care of the patient.    These notes generated with voice recognition software. I apologize for typographical errors.  Ronny Bacon M.D., FACS 05/10/2022, 5:00 PM

## 2022-05-10 ENCOUNTER — Encounter: Payer: Self-pay | Admitting: Surgery

## 2022-05-10 ENCOUNTER — Other Ambulatory Visit: Payer: Self-pay

## 2022-05-10 ENCOUNTER — Ambulatory Visit (INDEPENDENT_AMBULATORY_CARE_PROVIDER_SITE_OTHER): Payer: Medicare Other | Admitting: Surgery

## 2022-05-10 VITALS — BP 143/88 | HR 66 | Temp 98.0°F | Ht 60.0 in | Wt 167.0 lb

## 2022-05-10 DIAGNOSIS — S300XXS Contusion of lower back and pelvis, sequela: Secondary | ICD-10-CM

## 2022-05-10 DIAGNOSIS — S7011XA Contusion of right thigh, initial encounter: Secondary | ICD-10-CM

## 2022-05-10 DIAGNOSIS — S300XXA Contusion of lower back and pelvis, initial encounter: Secondary | ICD-10-CM | POA: Insufficient documentation

## 2022-05-10 DIAGNOSIS — W19XXXA Unspecified fall, initial encounter: Secondary | ICD-10-CM | POA: Diagnosis not present

## 2022-05-11 ENCOUNTER — Ambulatory Visit: Payer: Self-pay

## 2022-05-11 ENCOUNTER — Other Ambulatory Visit: Payer: Self-pay | Admitting: Cardiology

## 2022-05-11 NOTE — Patient Outreach (Signed)
  Care Coordination   Follow Up Visit Note   05/11/2022 Name: ANJULIE DIPIERRO MRN: [id number] DOB: 12-22-1950  Sarah Barnes is a 71 y.o. year old female who sees McDonough, Si Gaul, PA-C for primary care. I spoke with  Sarah Barnes by phone today.  What matters to the patients health and wellness today?  Ongoing assistance with care coordination needs.     Goals Addressed             This Visit's Progress    Patient Stated:  Assistance with care coordination needs       Care Coordination Interventions: Patient contacted to discuss visit with general surgeon regarding hematoma:   patient states the appointment went " excellent."  She states per the surgeon the hematoma is healing well and not infected.  She states the surgeon opened the area a little to allow it to drain more.  She states she was advised to keep the area clean and dressed Reviewed signs and symptoms of infection with patient. Advised to contact surgeon's office for any new or ongoing symptoms.          SDOH assessments and interventions completed:  No     Care Coordination Interventions:  Yes, provided   Follow up plan: Follow up call scheduled for 06/20/22    Encounter Outcome:  Pt. Visit Completed   Quinn Plowman RN,BSN,CCM [place] [phone] direct line

## 2022-05-15 DIAGNOSIS — G4733 Obstructive sleep apnea (adult) (pediatric): Secondary | ICD-10-CM | POA: Diagnosis not present

## 2022-05-16 ENCOUNTER — Other Ambulatory Visit: Payer: Self-pay | Admitting: Physician Assistant

## 2022-05-16 DIAGNOSIS — W19XXXS Unspecified fall, sequela: Secondary | ICD-10-CM

## 2022-05-16 DIAGNOSIS — R2689 Other abnormalities of gait and mobility: Secondary | ICD-10-CM

## 2022-05-18 ENCOUNTER — Telehealth: Payer: Self-pay

## 2022-05-18 NOTE — Telephone Encounter (Signed)
Send [facility] for home health waiting for respond

## 2022-05-23 ENCOUNTER — Telehealth: Payer: Self-pay

## 2022-05-23 NOTE — Telephone Encounter (Signed)
   Telephone encounter was:  Successful.  05/23/2022 Name: ESMEE FALLAW MRN: [id number] DOB: 01/09/1951  Eulis Foster is a 72 y.o. year old female who is a primary care patient of McDonough, Si Gaul, PA-C . The community resource team was consulted for assistance with Transportation Needs   Care guide performed the following interventions: Spoke with patient to inform her that I spoke with N'dea at [facility] transportation and she confirmed the patient has transportation benefit.  Informed patient that N'dea will contact her today or tomorrow morning.  Patient informed me she was able to contact [employer] and she has scheduled her transportation for 05/29/22. [employer] will mail an application to the patient's home address.  Follow Up Plan:  No further follow up planned at this time. The patient has been provided with needed resources.  [place] Resource Care Guide   [email or web address]  ?? [phone]   Website: [email or web address]  [email or web address]

## 2022-05-23 NOTE — Telephone Encounter (Signed)
   Telephone encounter was:  Successful.  05/23/2022 Name: TOMASITA BEEVERS MRN: [id number] DOB: [birth year]  Eulis Foster is a 72 y.o. year old female who is a primary care patient of McDonough, Si Gaul, PA-C . The community resource team was consulted for assistance with Transportation Needs   Care guide performed the following interventions: Spoke with N'dea at [facility] [phone], she confirmed the patient had Medicaid Transportation.  She will contact the patient today or tomorrow morning to complete application over the phone.  Follow Up Plan:   I will contact patient to inform her to expect a call from [facility] transportation.  [place] Resource Care Guide   [email or web address]  ?? [phone]   Website: [email or web address]  [email or web address]

## 2022-05-23 NOTE — Patient Outreach (Signed)
  Care Coordination   Follow Up Visit Note   05/23/2022 Name: Sarah Barnes MRN: [id number] DOB: August 31, 1950  Sarah Barnes is a 72 y.o. year old female who sees McDonough, Si Gaul, PA-C for primary care. I spoke with  Sarah Barnes by phone today.  What matters to the patients health and wellness today?  Transportation assistance follow up    Goals Addressed             This Visit's Progress    Patient Stated:  Assistance with care coordination needs       Care Coordination Interventions: Re-referred patient to community resource guide regarding transportation assistance follow up. Patient states her [facility] insurance is not using the service and she reports having a follow up appointment with the surgeon on 05/29/22.  Patient states she still needs transportation assistance. Voice mail message left for Milicent, community care guide regarding patients transportation concerns.            SDOH assessments and interventions completed:  No     Care Coordination Interventions:  Yes, provided   Follow up plan:  as previously scheduled    Encounter Outcome:  Pt. Visit Completed   Quinn Plowman RN,BSN,CCM [place] [phone] direct line

## 2022-05-24 ENCOUNTER — Telehealth: Payer: Self-pay

## 2022-05-24 NOTE — Telephone Encounter (Signed)
This encounter was created in error - please disregard.

## 2022-05-24 NOTE — Telephone Encounter (Signed)
Gave verbal order to [facility] for start care from tomorrow [phone]

## 2022-05-25 ENCOUNTER — Other Ambulatory Visit: Payer: Self-pay | Admitting: Internal Medicine

## 2022-05-25 ENCOUNTER — Other Ambulatory Visit: Payer: Self-pay | Admitting: Physician Assistant

## 2022-05-25 ENCOUNTER — Telehealth: Payer: Self-pay

## 2022-05-25 DIAGNOSIS — F411 Generalized anxiety disorder: Secondary | ICD-10-CM

## 2022-05-25 NOTE — Progress Notes (Signed)
Carelink Summary Report / Loop Recorder 

## 2022-05-25 NOTE — Telephone Encounter (Signed)
Sarah Barnes from [place] [phone] called that they are unable  to see her today due pt is having issue and also they schedule to see her Wednesday

## 2022-05-28 NOTE — Progress Notes (Deleted)
Patient ID: Sarah Barnes, female   DOB: August 25, 1950, 72 y.o.   MRN: [id number]  Chief Complaint: Sequelae of traumatic hematoma proximal right posterior thigh  History of Present Illness Sarah Barnes is a 72 y.o. female with a history of utilizing 2 anticoagulants, fell while crossing a wooden bridge and fell back onto the bridge and eventually onto a pile of wood.  This happened early November, originally she reports it was remarkably bruised, had bright red bleeding from the area, subsequently darker drainage early December, diminishing to a clear drainage over the last few weeks.    Past Medical History Past Medical History:  Diagnosis Date   Agoraphobia with panic disorder    Anemia    Arthritis    "all over" (08/13/2016)   Chronic bronchitis (HCC)    COPD (chronic obstructive pulmonary disease) ([place])    Cryptogenic stroke ([place])    a. 06/2020 Head CT: low density caudate nucleus concerning for infarct; b. 07/2020 s/p MDT Linq; c. 11/2020 Finding of Afib on Linq.   Depression    ETOH abuse    GAD (generalized anxiety disorder)    GERD (gastroesophageal reflux disease)    Headache    "daily til I got glasses; now have headache once in awhile" (08/13/2016)   Hepatitis 1960   "isolated &  hospitalized for 2 weeks; don't know which kind of hepatitis"    Hernia, hiatal    High cholesterol    Hypertension    Hypertrophic obstructive cardiomyopathy ([place]) 12/2016   Echo showed increased LV wall thickness with grade 2 mmHg LVOT gradient and SAM along with GRII DD.  Consistent with HOCM.   Mild aortic stenosis    a. 01/2019 Echo: EF 55-60%. Mild AS; b. 12/2019 Echo: Nl EF. Mild diast dysfxn. Trace AS/AI/TR. Mild MR.   Nonobstructive CAD (coronary artery disease)    a. 12/2016 Cath: LM nl, LAD min irregs, LCX nl, OM1/2 min irregs, RCA nl, RPDA min irregs. EF 55-65%.   PAD (peripheral artery disease) ([place])    a. 09/29/2020 s/p PTA/DBA to L prox/mid SFA and stenting of L SFA; b. 10/06/2020 PTA  of R TP trunk and prox peroenal. PTA/DBA of R SFA and prox R Popliteal. R SFA stenting x 2; c. 10/2020 ABI: R 0.92, L 0.93.   PAF (paroxysmal atrial fibrillation) ([place])    a.11/2020 PAF noted on Linq; b. CHA2DS2VASc = 6-->eliquis.   Pneumonia    "once or twice" (08/13/2016)   PONV (postoperative nausea and vomiting)    Sciatica    Tobacco abuse       Past Surgical History:  Procedure Laterality Date   ANAL FISTULECTOMY  1970s X 3   BACK SURGERY     Cardiac MRI     Hypertrophic cardiomyopathy with septal thickness of 19 mm. EF 72%, moderate MR. . Moderate LAD. Partial fusion of right and left cusps of the aortic valve- no stenosis. LVOT turbulence , transient SAM   COLONOSCOPY WITH PROPOFOL N/A 04/24/2021   Procedure: COLONOSCOPY WITH PROPOFOL;  Surgeon: [name], MD;  Location: [facility] ENDOSCOPY;  Service: Gastroenterology;  Laterality: N/A;   COLONOSCOPY WITH PROPOFOL N/A 06/15/2021   Procedure: COLONOSCOPY WITH PROPOFOL;  Surgeon: [name], MD;  Location: [facility] ENDOSCOPY;  Service: Gastroenterology;  Laterality: N/A;   FRACTURE SURGERY     HERNIA REPAIR     KNEE ARTHROSCOPY Right    LAPAROSCOPIC CHOLECYSTECTOMY     LAPAROSCOPIC INCISIONAL / UMBILICAL / VENTRAL HERNIA REPAIR  08/13/2016  VHR w/mesh   LOWER EXTREMITY ANGIOGRAPHY Left 09/29/2020   Procedure: LOWER EXTREMITY ANGIOGRAPHY;  Surgeon: Algernon Huxley, MD;  Location: [place] CV LAB;  Service: Cardiovascular;  Laterality: Left;   LOWER EXTREMITY ANGIOGRAPHY Right 10/06/2020   Procedure: LOWER EXTREMITY ANGIOGRAPHY;  Surgeon: Algernon Huxley, MD;  Location: [place] CV LAB;  Service: Cardiovascular;  Laterality: Right;   LUMBAR DISC SURGERY     "herniated disc"   NASAL FRACTURE SURGERY  1970s X 2   RIGHT/LEFT HEART CATH AND CORONARY ANGIOGRAPHY N/A 12/27/2016   Procedure: RIGHT/LEFT HEART CATH AND CORONARY ANGIOGRAPHY;  Surgeon: Leonie Man, MD;  Location: [place] INVASIVE CV LAB: Mild-moderate pulmonary hypertension.   Hyperdynamic ventricle noted.  EF 55-65% -hyperdynamic (unable to measure LVOT gradient).  Mildly elevated very tortuous but angiographically normal coronary arteries, suggesting hypertensive heart disease   SHOULDER ARTHROSCOPY WITH ROTATOR CUFF REPAIR Right 1990s?   TONSILLECTOMY  1951   TRANSTHORACIC ECHOCARDIOGRAM  01/05/2017    EF 60-65% with dynamic outflow tract obstruction at rest. Peak gradient 52 mmHg consistent with HOCM.  GRII DD area. Aortic sclerosis but no stenosis. Systolic anterior motion of mitral valve chordae. Mild mitral stenosis. Moderate LA dilation and mild RA dilation. Peak PA pressures 35 mmHg.   TRANSTHORACIC ECHOCARDIOGRAM  01/08/2020   NOVA: EF 55 to 60%.  GR 1 DD.  Nodularity calcification.  Trivial AS/AR.  Mild MR   VENTRAL HERNIA REPAIR N/A 08/13/2016   Procedure: LAPAROSCOPIC VENTRAL HERNIA REPAIR WITH MESH;  Surgeon: Judeth Horn, MD;  Location: [place];  Service: General;  Laterality: N/A;    No Known Allergies  Current Outpatient Medications  Medication Sig Dispense Refill   albuterol (VENTOLIN HFA) 108 (90 Base) MCG/ACT inhaler Inhale 2 puffs into the lungs every 6 (six) hours as needed for wheezing or shortness of breath. 8 g 2   apixaban (ELIQUIS) 5 MG TABS tablet TAKE 1 TABLET BY MOUTH  TWICE DAILY 180 tablet 3   atorvastatin (LIPITOR) 40 MG tablet TAKE 1 TABLET BY MOUTH DAILY 100 tablet 2   buPROPion (WELLBUTRIN XL) 150 MG 24 hr tablet TAKE 1 TABLET BY MOUTH  DAILY 90 tablet 3   busPIRone (BUSPAR) 15 MG tablet TAKE 1 TABLET BY MOUTH  TWICE DAILY 180 tablet 1   carvedilol (COREG) 12.5 MG tablet Take 1.5 tablets (18.75 mg total) by mouth 2 (two) times daily. 300 tablet 2   celecoxib (CELEBREX) 100 MG capsule TAKE 1 CAPSULE BY MOUTH  DAILY 100 capsule 2   cilostazol (PLETAL) 50 MG tablet TAKE 1 TABLET BY MOUTH  TWICE DAILY 180 tablet 3   clopidogrel (PLAVIX) 75 MG tablet TAKE 1 TABLET BY MOUTH  DAILY 90 tablet 3   escitalopram (LEXAPRO) 10 MG tablet TAKE 1  TABLET BY MOUTH DAILY 90 tablet 3   fenofibrate micronized (LOFIBRA) 134 MG capsule Take 1 capsule (134 mg total) by mouth daily before breakfast. 90 capsule 3   folic acid (FOLVITE) 1 MG tablet Take 1 tablet (1 mg total) by mouth daily. 30 tablet 0   furosemide (LASIX) 40 MG tablet TAKE 1 TABLET BY MOUTH DAILY 100 tablet 2   gabapentin (NEURONTIN) 100 MG capsule TAKE 2 CAPSULES BY MOUTH IN THE MORNING , 1 CAPSULE IN  THE AFTERNOON AND 2  CAPSULES AT NIGHT 450 capsule 3   hydrOXYzine (VISTARIL) 25 MG capsule Take one tab po qd prn for anxiety 90 capsule 1   Multiple Vitamin (MULTIVITAMIN WITH MINERALS) TABS tablet  Take 1 tablet by mouth daily.     pantoprazole (PROTONIX) 40 MG tablet TAKE 1 TABLET BY MOUTH  DAILY 90 tablet 3   traZODone (DESYREL) 100 MG tablet TAKE 1 TABLET BY MOUTH  DAILY AT BEDTIME 90 tablet 3   valsartan (DIOVAN) 160 MG tablet TAKE 1 TABLET BY MOUTH  DAILY 90 tablet 3   No current facility-administered medications for this visit.    Family History Family History  Problem Relation Age of Onset   Diabetes Mother    Hyperlipidemia Mother    Hypertension Mother    Heart attack Mother 24   Angina Mother        chronic problem   Hypertension Father    Stroke Father    Cancer Brother       Social History Social History   Tobacco Use   Smoking status: Every Day    Packs/day: 0.50    Years: 49.00    Total pack years: 24.50    Types: Cigarettes   Smokeless tobacco: Former   Tobacco comments:    1/2 pack daily  Vaping Use   Vaping Use: Former  Substance Use Topics   Alcohol use: Not Currently    Alcohol/week: 84.0 standard drinks of alcohol    Types: 36 Cans of beer, 48 Shots of liquor per week    Comment: couple of times a week. Had a beer today   Drug use: Not Currently    Frequency: 1.0 times per week    Types: Marijuana, Cocaine    Comment: Cocaine + March 2017        Review of Systems  Constitutional: Negative.   HENT:  Positive for hearing  loss.   Eyes: Negative.   Respiratory:  Positive for shortness of breath and wheezing.   Cardiovascular:  Positive for palpitations.  Gastrointestinal: Negative.   Genitourinary: Negative.   Skin: Negative.   Neurological: Negative.   Endo/Heme/Allergies:  Bruises/bleeds easily.  Psychiatric/Behavioral: Negative.       Physical Exam There were no vitals taken for this visit.    CONSTITUTIONAL: Well developed, and nourished, appropriately responsive and aware without distress.   EYES: Sclera non-icteric.   EARS, NOSE, MOUTH AND THROAT: Mask worn.  The oropharynx is clear. Oral mucosa is pink and moist.    Hearing is intact to voice.  NECK: Trachea is midline, and there is no jugular venous distension.  LYMPH NODES:  Lymph nodes in the neck are not appreciated. RESPIRATORY:  Lungs are clear, and breath sounds are equal bilaterally. Normal respiratory effort without pathologic use of accessory muscles. CARDIOVASCULAR: Heart is regular in rate and rhythm.  Well perfused.  GI: The abdomen is soft, nontender, and nondistended. MUSCULOSKELETAL:  Symmetrical muscle tone appreciated in all four extremities.    SKIN: Skin turgor is normal. No pathologic skin lesions appreciated.   Proximal right posterior thigh there is a 1 cm opening, with a well-established linear groove of fluid collection.  Does not seem to be retaining fluid, but has indurated channel, after topical Betadine prep, I probed this opening and this channel with a sterile Q-tip and was able to find a depth of approximately 9 cm toward anterior.  There is no evidence of purulent drainage, current cellulitic change, erythema or malodor present. NEUROLOGIC:  Motor and sensation appear grossly normal.  Cranial nerves are grossly without defect. PSYCH:  Alert and oriented to person, place and time. Affect is appropriate for situation.  Data Reviewed I have personally reviewed  what is currently available of the patient's imaging,  recent labs and medical records.   Labs:     Latest Ref Rng & Units 04/27/2022   10:31 AM 04/08/2022    5:30 PM 03/23/2022    1:57 PM  CBC  WBC 4.0 - 10.5 K/uL 9.0  5.0  11.6   Hemoglobin 12.0 - 15.0 g/dL 14.9  11.4  10.1   Hematocrit 36.0 - 46.0 % 44.7  33.6  29.0   Platelets 150 - 400 K/uL 175  210  130       Latest Ref Rng & Units 04/08/2022    5:30 PM 03/23/2022    1:57 PM 06/29/2021    3:37 AM  CMP  Glucose 70 - 99 mg/dL 117  126  153   BUN 8 - 23 mg/dL '9  17  15   '$ Creatinine 0.44 - 1.00 mg/dL 0.67  0.85  0.63   Sodium 135 - 145 mmol/L 141  139  141   Potassium 3.5 - 5.1 mmol/L 3.2  3.5  3.2   Chloride 98 - 111 mmol/L 105  106  110   CO2 22 - 32 mmol/L '26  24  24   '$ Calcium 8.9 - 10.3 mg/dL 9.0  8.9  8.2   Total Protein 6.5 - 8.1 g/dL   6.6   Total Bilirubin 0.3 - 1.2 mg/dL   0.7   Alkaline Phos 38 - 126 U/L   50   AST 15 - 41 U/L   22   ALT 0 - 44 U/L   16       Imaging: Radiological images reviewed:   Within last 24 hrs: No results found.  Assessment    Draining hematoma right posterior proximal thigh. Patient Active Problem List   Diagnosis Date Noted   Traumatic hematoma of buttock 05/10/2022   Paroxysmal atrial fibrillation ([place]) 12/09/2021   Hypercoagulable state due to paroxysmal atrial fibrillation ([place]); CHA2DS2-Vasc: 6 12/09/2021   OSA (obstructive sleep apnea) 01/03/2021   Other hypersomnia 01/03/2021   Atherosclerosis of native arteries of extremity with intermittent claudication ([place]) 09/13/2020   Stroke ([place]) 07/04/2020   COPD (chronic obstructive pulmonary disease) (HCC)    HLD (hyperlipidemia)    Chronic diastolic CHF (congestive heart failure) ([place])    CKD (chronic kidney disease), stage IIIb    Encounter for general adult medical examination with abnormal findings 02/09/2020   Vitamin D deficiency 02/09/2020   Dysuria 02/09/2020   Bilateral leg pain 11/28/2019   Alcohol use disorder    Hypertrophic obstructive cardiomyopathy ([place])     Syncope 12/25/2016   Heart palpitations - PACs 12/24/2016   Shortness of breath at rest 12/24/2016   Coronary artery disease, non-occlusive 12/2016   Ventral hernia 08/13/2016   Agitation 08/11/2015   Panic disorder with agoraphobia 07/13/2015   Tobacco use disorder 07/12/2015   Substance abuse ([place]) 07/12/2015   Generalized anxiety disorder 05/17/2014   Hypertriglyceridemia 05/05/2014   GERD (gastroesophageal reflux disease) 05/05/2014   Essential hypertension 05/05/2014    Plan    Considering her blood thinning, we discussed the role of a wider incision and drainage, at present it appears there is no retention of this draining fluid, there is no evidence of infection, it appears this drainage tract is well-established.  I think at present it is also relatively easy to care for.  So therefore I do not believe intervention is in her best interest at this time.  We encouraged her to continue it  keep it clean and dry and apply a dry cover dressing to protect her clothing.  Will be glad to see her back in a couple weeks to see what kind of progress it is making.  Face-to-face time spent with the patient and accompanying care providers(if present) was 30 minutes, with more than 50% of the time spent counseling, educating, and coordinating care of the patient.    These notes generated with voice recognition software. I apologize for typographical errors.  Ronny Bacon M.D., FACS 05/28/2022, 12:47 PM

## 2022-05-29 ENCOUNTER — Ambulatory Visit: Payer: Medicare Other | Admitting: Surgery

## 2022-05-30 DIAGNOSIS — S300XXA Contusion of lower back and pelvis, initial encounter: Secondary | ICD-10-CM | POA: Diagnosis not present

## 2022-06-01 ENCOUNTER — Telehealth: Payer: Self-pay

## 2022-06-01 NOTE — Telephone Encounter (Signed)
Gave verbal to [facility] physical [phone] for 1 week 1 ,2 week 3 and 1 week 5

## 2022-06-04 ENCOUNTER — Other Ambulatory Visit: Payer: Self-pay | Admitting: Physician Assistant

## 2022-06-05 ENCOUNTER — Ambulatory Visit (INDEPENDENT_AMBULATORY_CARE_PROVIDER_SITE_OTHER): Payer: Medicare Other | Admitting: Surgery

## 2022-06-05 ENCOUNTER — Encounter: Payer: Self-pay | Admitting: Surgery

## 2022-06-05 VITALS — BP 159/83 | HR 62 | Temp 98.3°F | Ht 60.0 in | Wt 176.2 lb

## 2022-06-05 DIAGNOSIS — S300XXS Contusion of lower back and pelvis, sequela: Secondary | ICD-10-CM

## 2022-06-05 DIAGNOSIS — W19XXXD Unspecified fall, subsequent encounter: Secondary | ICD-10-CM

## 2022-06-05 DIAGNOSIS — S7011XD Contusion of right thigh, subsequent encounter: Secondary | ICD-10-CM | POA: Diagnosis not present

## 2022-06-05 DIAGNOSIS — S300XXA Contusion of lower back and pelvis, initial encounter: Secondary | ICD-10-CM | POA: Diagnosis not present

## 2022-06-05 NOTE — Progress Notes (Signed)
Patient ID: Sarah Barnes, female   DOB: [birth year], 72 y.o.   MRN: [id number]  Chief Complaint: Sequelae of traumatic hematoma proximal right posterior thigh  History of Present Illness She is in follow-up for the above.  She sees very minimal drainage of the site, there is essentially little to no residual pain or discomfort in the area. Sarah Barnes is a 72 y.o. female with a history of utilizing 2 anticoagulants, fell while crossing a wooden bridge and fell back onto the bridge and eventually onto a pile of wood.  This happened early November, originally she reports it was remarkably bruised, had bright red bleeding from the area, subsequently darker drainage early December, diminishing to a clear drainage over the last few weeks.    Past Medical History Past Medical History:  Diagnosis Date   Agoraphobia with panic disorder    Anemia    Arthritis    "all over" (08/13/2016)   Chronic bronchitis (HCC)    COPD (chronic obstructive pulmonary disease) ([place])    Cryptogenic stroke ([place])    a. 06/2020 Head CT: low density caudate nucleus concerning for infarct; b. 07/2020 s/p MDT Linq; c. 11/2020 Finding of Afib on Linq.   Depression    ETOH abuse    GAD (generalized anxiety disorder)    GERD (gastroesophageal reflux disease)    Headache    "daily til I got glasses; now have headache once in awhile" (08/13/2016)   Hepatitis 1960   "isolated &  hospitalized for 2 weeks; don't know which kind of hepatitis"    Hernia, hiatal    High cholesterol    Hypertension    Hypertrophic obstructive cardiomyopathy ([place]) 12/2016   Echo showed increased LV wall thickness with grade 2 mmHg LVOT gradient and SAM along with GRII DD.  Consistent with HOCM.   Mild aortic stenosis    a. 01/2019 Echo: EF 55-60%. Mild AS; b. 12/2019 Echo: Nl EF. Mild diast dysfxn. Trace AS/AI/TR. Mild MR.   Nonobstructive CAD (coronary artery disease)    a. 12/2016 Cath: LM nl, LAD min irregs, LCX nl, OM1/2 min irregs, RCA nl,  RPDA min irregs. EF 55-65%.   PAD (peripheral artery disease) ([place])    a. 09/29/2020 s/p PTA/DBA to L prox/mid SFA and stenting of L SFA; b. 10/06/2020 PTA of R TP trunk and prox peroenal. PTA/DBA of R SFA and prox R Popliteal. R SFA stenting x 2; c. 10/2020 ABI: R 0.92, L 0.93.   PAF (paroxysmal atrial fibrillation) ([place])    a.11/2020 PAF noted on Linq; b. CHA2DS2VASc = 6-->eliquis.   Pneumonia    "once or twice" (08/13/2016)   PONV (postoperative nausea and vomiting)    Sciatica    Tobacco abuse       Past Surgical History:  Procedure Laterality Date   ANAL FISTULECTOMY  1970s X 3   BACK SURGERY     Cardiac MRI     Hypertrophic cardiomyopathy with septal thickness of 19 mm. EF 72%, moderate MR. . Moderate LAD. Partial fusion of right and left cusps of the aortic valve- no stenosis. LVOT turbulence , transient SAM   COLONOSCOPY WITH PROPOFOL N/A 04/24/2021   Procedure: COLONOSCOPY WITH PROPOFOL;  Surgeon: [name], MD;  Location: [facility] ENDOSCOPY;  Service: Gastroenterology;  Laterality: N/A;   COLONOSCOPY WITH PROPOFOL N/A 06/15/2021   Procedure: COLONOSCOPY WITH PROPOFOL;  Surgeon: [name], MD;  Location: [facility] ENDOSCOPY;  Service: Gastroenterology;  Laterality: N/A;   FRACTURE SURGERY  HERNIA REPAIR     KNEE ARTHROSCOPY Right    LAPAROSCOPIC CHOLECYSTECTOMY     LAPAROSCOPIC INCISIONAL / UMBILICAL / VENTRAL HERNIA REPAIR  08/13/2016   [place] w/mesh   LOWER EXTREMITY ANGIOGRAPHY Left 09/29/2020   Procedure: LOWER EXTREMITY ANGIOGRAPHY;  Surgeon: Algernon Huxley, MD;  Location: [place] CV LAB;  Service: Cardiovascular;  Laterality: Left;   LOWER EXTREMITY ANGIOGRAPHY Right 10/06/2020   Procedure: LOWER EXTREMITY ANGIOGRAPHY;  Surgeon: Algernon Huxley, MD;  Location: [place] CV LAB;  Service: Cardiovascular;  Laterality: Right;   LUMBAR DISC SURGERY     "herniated disc"   NASAL FRACTURE SURGERY  1970s X 2   RIGHT/LEFT HEART CATH AND CORONARY ANGIOGRAPHY N/A 12/27/2016    Procedure: RIGHT/LEFT HEART CATH AND CORONARY ANGIOGRAPHY;  Surgeon: Leonie Man, MD;  Location: [place] INVASIVE CV LAB: Mild-moderate pulmonary hypertension.  Hyperdynamic ventricle noted.  EF 55-65% -hyperdynamic (unable to measure LVOT gradient).  Mildly elevated very tortuous but angiographically normal coronary arteries, suggesting hypertensive heart disease   SHOULDER ARTHROSCOPY WITH ROTATOR CUFF REPAIR Right 1990s?   TONSILLECTOMY  1951   TRANSTHORACIC ECHOCARDIOGRAM  01/05/2017    EF 60-65% with dynamic outflow tract obstruction at rest. Peak gradient 52 mmHg consistent with HOCM.  GRII DD area. Aortic sclerosis but no stenosis. Systolic anterior motion of mitral valve chordae. Mild mitral stenosis. Moderate LA dilation and mild RA dilation. Peak PA pressures 35 mmHg.   TRANSTHORACIC ECHOCARDIOGRAM  01/08/2020   [place]: EF 55 to 60%.  GR 1 DD.  Nodularity calcification.  Trivial AS/AR.  Mild MR   VENTRAL HERNIA REPAIR N/A 08/13/2016   Procedure: LAPAROSCOPIC VENTRAL HERNIA REPAIR WITH MESH;  Surgeon: Judeth Horn, MD;  Location: [place];  Service: General;  Laterality: N/A;    No Known Allergies  Current Outpatient Medications  Medication Sig Dispense Refill   albuterol (VENTOLIN HFA) 108 (90 Base) MCG/ACT inhaler Inhale 2 puffs into the lungs every 6 (six) hours as needed for wheezing or shortness of breath. 8 g 2   apixaban (ELIQUIS) 5 MG TABS tablet TAKE 1 TABLET BY MOUTH  TWICE DAILY 180 tablet 3   atorvastatin (LIPITOR) 40 MG tablet TAKE 1 TABLET BY MOUTH DAILY 100 tablet 2   buPROPion (WELLBUTRIN XL) 150 MG 24 hr tablet TAKE 1 TABLET BY MOUTH  DAILY 90 tablet 3   busPIRone (BUSPAR) 15 MG tablet TAKE 1 TABLET BY MOUTH  TWICE DAILY 180 tablet 1   carvedilol (COREG) 12.5 MG tablet Take 1.5 tablets (18.75 mg total) by mouth 2 (two) times daily. 300 tablet 2   celecoxib (CELEBREX) 100 MG capsule TAKE 1 CAPSULE BY MOUTH  DAILY 100 capsule 2   cilostazol (PLETAL) 50 MG tablet TAKE 1 TABLET  BY MOUTH  TWICE DAILY 180 tablet 3   clopidogrel (PLAVIX) 75 MG tablet TAKE 1 TABLET BY MOUTH  DAILY 90 tablet 3   escitalopram (LEXAPRO) 10 MG tablet TAKE 1 TABLET BY MOUTH DAILY 90 tablet 3   fenofibrate micronized (LOFIBRA) 134 MG capsule Take 1 capsule (134 mg total) by mouth daily before breakfast. 90 capsule 3   folic acid (FOLVITE) 1 MG tablet Take 1 tablet (1 mg total) by mouth daily. 30 tablet 0   furosemide (LASIX) 40 MG tablet TAKE 1 TABLET BY MOUTH DAILY 100 tablet 2   gabapentin (NEURONTIN) 100 MG capsule TAKE 2 CAPSULES BY MOUTH IN THE MORNING , 1 CAPSULE IN  THE AFTERNOON AND 2  CAPSULES AT NIGHT  450 capsule 3   hydrOXYzine (VISTARIL) 25 MG capsule Take one tab po qd prn for anxiety 90 capsule 1   Multiple Vitamin (MULTIVITAMIN WITH MINERALS) TABS tablet Take 1 tablet by mouth daily.     pantoprazole (PROTONIX) 40 MG tablet TAKE 1 TABLET BY MOUTH DAILY 100 tablet 2   traZODone (DESYREL) 100 MG tablet TAKE 1 TABLET BY MOUTH  DAILY AT BEDTIME 90 tablet 3   valsartan (DIOVAN) 160 MG tablet TAKE 1 TABLET BY MOUTH  DAILY 90 tablet 3   No current facility-administered medications for this visit.    Family History Family History  Problem Relation Age of Onset   Diabetes Mother    Hyperlipidemia Mother    Hypertension Mother    Heart attack Mother 55   Angina Mother        chronic problem   Hypertension Father    Stroke Father    Cancer Brother       Social History Social History   Tobacco Use   Smoking status: Every Day    Packs/day: 0.50    Years: 49.00    Total pack years: 24.50    Types: Cigarettes   Smokeless tobacco: Former   Tobacco comments:    1/2 pack daily  Vaping Use   Vaping Use: Former  Substance Use Topics   Alcohol use: Not Currently    Alcohol/week: 84.0 standard drinks of alcohol    Types: 36 Cans of beer, 48 Shots of liquor per week    Comment: couple of times a week. Had a beer today   Drug use: Not Currently    Frequency: 1.0 times per  week    Types: Marijuana, Cocaine    Comment: Cocaine + March 2017        Review of Systems  Constitutional: Negative.   HENT:  Positive for hearing loss.   Eyes: Negative.   Respiratory:  Positive for shortness of breath and wheezing.   Cardiovascular:  Positive for palpitations.  Gastrointestinal: Negative.   Genitourinary: Negative.   Skin: Negative.   Neurological: Negative.   Endo/Heme/Allergies:  Bruises/bleeds easily.  Psychiatric/Behavioral: Negative.       Physical Exam Blood pressure (!) 159/83, pulse 62, temperature 98.3 F (36.8 C), temperature source Oral, height 5' (1.524 m), weight 176 lb 3.2 oz (79.9 kg), SpO2 92 %. Last Weight  Most recent update: 06/05/2022 10:07 AM    Weight  79.9 kg (176 lb 3.2 oz)              CONSTITUTIONAL: Well developed, and nourished, appropriately responsive and aware without distress.   EYES: Sclera non-icteric.   EARS, NOSE, MOUTH AND THROAT: Mask worn.  The oropharynx is clear. Oral mucosa is pink and moist.    Hearing is intact to voice.  NECK: Trachea is midline, and there is no jugular venous distension.  LYMPH NODES:  Lymph nodes in the neck are not appreciated. RESPIRATORY:  Lungs are clear, and breath sounds are equal bilaterally. Normal respiratory effort without pathologic use of accessory muscles. CARDIOVASCULAR: Heart is regular in rate and rhythm.  Well perfused.  GI: The abdomen is soft, nontender, and nondistended. MUSCULOSKELETAL:  Symmetrical muscle tone appreciated in all four extremities.    SKIN: Skin turgor is normal. No pathologic skin lesions appreciated.   Proximal right posterior thigh there is no appreciable opening, Does not seem to have any residual fluid, induration or edema present.  There is mild expected brawny changes to the area  consistent with the healing process.    NEUROLOGIC:  Motor and sensation appear grossly normal.  Cranial nerves are grossly without defect. PSYCH:  Alert and  oriented to person, place and time. Affect is appropriate for situation.  Data Reviewed I have personally reviewed what is currently available of the patient's imaging, recent labs and medical records.   Labs:     Latest Ref Rng & Units 04/27/2022   10:31 AM 04/08/2022    5:30 PM 03/23/2022    1:57 PM  CBC  WBC 4.0 - 10.5 K/uL 9.0  5.0  11.6   Hemoglobin 12.0 - 15.0 g/dL 14.9  11.4  10.1   Hematocrit 36.0 - 46.0 % 44.7  33.6  29.0   Platelets 150 - 400 K/uL 175  210  130       Latest Ref Rng & Units 04/08/2022    5:30 PM 03/23/2022    1:57 PM 06/29/2021    3:37 AM  CMP  Glucose 70 - 99 mg/dL 117  126  153   BUN 8 - 23 mg/dL '9  17  15   '$ Creatinine 0.44 - 1.00 mg/dL 0.67  0.85  0.63   Sodium 135 - 145 mmol/L 141  139  141   Potassium 3.5 - 5.1 mmol/L 3.2  3.5  3.2   Chloride 98 - 111 mmol/L 105  106  110   CO2 22 - 32 mmol/L '26  24  24   '$ Calcium 8.9 - 10.3 mg/dL 9.0  8.9  8.2   Total Protein 6.5 - 8.1 g/dL   6.6   Total Bilirubin 0.3 - 1.2 mg/dL   0.7   Alkaline Phos 38 - 126 U/L   50   AST 15 - 41 U/L   22   ALT 0 - 44 U/L   16       Imaging: Radiological images reviewed:   Within last 24 hrs: No results found.  Assessment    History of hematoma right posterior proximal thigh, essentially resolved. Patient Active Problem List   Diagnosis Date Noted   Traumatic hematoma of buttock 05/10/2022   Paroxysmal atrial fibrillation ([place]) 12/09/2021   Hypercoagulable state due to paroxysmal atrial fibrillation ([place]); CHA2DS2-Vasc: 6 12/09/2021   OSA (obstructive sleep apnea) 01/03/2021   Other hypersomnia 01/03/2021   Atherosclerosis of native arteries of extremity with intermittent claudication ([place]) 09/13/2020   Stroke ([place]) 07/04/2020   COPD (chronic obstructive pulmonary disease) (HCC)    HLD (hyperlipidemia)    Chronic diastolic CHF (congestive heart failure) ([place])    CKD (chronic kidney disease), stage IIIb    Encounter for general adult medical examination with  abnormal findings 02/09/2020   Vitamin D deficiency 02/09/2020   Dysuria 02/09/2020   Bilateral leg pain 11/28/2019   Alcohol use disorder    Hypertrophic obstructive cardiomyopathy ([place])    Syncope 12/25/2016   Heart palpitations - PACs 12/24/2016   Shortness of breath at rest 12/24/2016   Coronary artery disease, non-occlusive 12/2016   Ventral hernia 08/13/2016   Agitation 08/11/2015   Panic disorder with agoraphobia 07/13/2015   Tobacco use disorder 07/12/2015   Substance abuse ([place]) 07/12/2015   Generalized anxiety disorder 05/17/2014   Hypertriglyceridemia 05/05/2014   GERD (gastroesophageal reflux disease) 05/05/2014   Essential hypertension 05/05/2014    Plan    Follow-up with [place] as needed.  Face-to-face time spent with the patient and accompanying care providers(if present) was 15 minutes, with more than 50% of the time  spent counseling, educating, and coordinating care of the patient.    These notes generated with voice recognition software. I apologize for typographical errors.  Ronny Bacon M.D., FACS 06/05/2022, 11:27 AM

## 2022-06-05 NOTE — Patient Instructions (Signed)
If you have any concerns or questions, please feel free to call our office. Follow up as needed.   Hematoma A hematoma is a collection of blood. A hematoma can happen: Under the skin. In an organ. In a body space. In a joint space. In other tissues. The blood can thicken (clot) to form a lump that you can see and feel. The lump is often hard and may become sore and tender. The lump can be very small or very big. Most hematomas get better in a few days to weeks. However, some of these may be serious and need medical care. What are the causes? This condition is caused by: An injury. Blood that leaks under the skin. Problems from surgeries. Medical conditions that cause bleeding or bruising. What increases the risk? You are more likely to develop this condition if: You are an older adult. You use medicines that thin your blood. You use NSAIDs, such as ibuprofen, often for pain. You play contact sports. What are the signs or symptoms? Symptoms depend on where the hematoma is in your body. If the hematoma is under the skin, there is: A firm lump on the body. Pain and tenderness in the area. Bruising. The skin above the lump may be blue, dark blue, purple-red, or yellowish. If the hematoma is deep in the tissues or body spaces, there may be: Blood in the stomach. This may cause pain in the belly (abdomen), weakness, passing out (fainting), and shortness of breath. Blood in the head. This may cause a headache, weakness, trouble speaking or understanding speech, or passing out. How is this treated? Treatment depends on the cause, size, and location of the hematoma. Treatment may include: Doing nothing. Many hematomas go away on their own without treatment. Surgery or close monitoring. This may be needed for large hematomas or hematomas that affect the body's organs. Medicines. These may be given if a medical condition caused the hematoma. Follow these instructions at home: Managing pain,  stiffness, and swelling  If told, put ice on the injured area. To do this: Put ice in a plastic bag. Place a towel between your skin and the bag. Leave the ice on for 20 minutes, 2-3 times a day for the first two days. If your skin turns bright red, take off the ice right away to prevent skin damage. The risk of skin damage is higher if you cannot feel pain, heat, or cold. If told, put heat on the injured area. Do this as often as told by your doctor. Use the heat source that your doctor recommends, such as a moist heat pack or a heating pad. Place a towel between your skin and the heat source. Leave the heat on for 20-30 minutes. If your skin turns bright red, take off the heat right away to prevent burns. The risk of burns is higher if you cannot feel pain, heat, or cold. Raise the injured area above the level of your heart while you are sitting or lying down. Wrap the affected area with an elastic bandage, if told by your doctor. Do not wrap the bandage too tight. If your hematoma is on a leg or foot and is painful, your doctor may give you crutches. Use them as told by your doctor. General instructions Take over-the-counter and prescription medicines only as told by your doctor. Keep all follow-up visits. Your doctor may want to see how your hematoma is healing with treatment. Contact a doctor if: You have a fever. The swelling  or bruising gets worse. You start to get more hematomas. Your pain gets worse. Your pain is not getting better with medicine. The skin over the hematoma breaks or starts to bleed. Get help right away if: Your hematoma is in your chest or belly and you: Pass out. Feel weak. Become short of breath. You have a hematoma on your scalp that is caused by a fall or injury, and you: Have a headache that gets worse. Have trouble speaking or understanding speech. Become less alert or you pass out. These symptoms may be an emergency. Get help right away. Call 911. Do  not wait to see if the symptoms will go away. Do not drive yourself to the hospital This information is not intended to replace advice given to you by your health care provider. Make sure you discuss any questions you have with your health care provider. Document Revised: 10/30/2021 Document Reviewed: 10/30/2021 Elsevier Patient Education  [place].

## 2022-06-08 DIAGNOSIS — S300XXA Contusion of lower back and pelvis, initial encounter: Secondary | ICD-10-CM | POA: Diagnosis not present

## 2022-06-11 DIAGNOSIS — S300XXA Contusion of lower back and pelvis, initial encounter: Secondary | ICD-10-CM | POA: Diagnosis not present

## 2022-06-15 DIAGNOSIS — G4733 Obstructive sleep apnea (adult) (pediatric): Secondary | ICD-10-CM | POA: Diagnosis not present

## 2022-06-15 DIAGNOSIS — S300XXA Contusion of lower back and pelvis, initial encounter: Secondary | ICD-10-CM | POA: Diagnosis not present

## 2022-06-18 ENCOUNTER — Telehealth: Payer: Self-pay

## 2022-06-18 DIAGNOSIS — S300XXA Contusion of lower back and pelvis, initial encounter: Secondary | ICD-10-CM | POA: Diagnosis not present

## 2022-06-18 NOTE — Patient Outreach (Addendum)
  Care Coordination   Follow Up Visit Note   06/18/2022 Name: Sarah Barnes MRN: [id number] DOB: 1950-12-18  Sarah Barnes is a 72 y.o. year old female who sees McDonough, Si Gaul, PA-C for primary care. I spoke with  Sarah Barnes by phone today.  What matters to the patients health and wellness today?  Patient states she has not further needs or concerns.  Agrees to close out of care coordination follow up at this time due to her goals being met.     Goals Addressed             This Visit's Progress    COMPLETED: Patient Stated:  Assistance with care coordination needs       Care Coordination Interventions: Patient reports having follow up with community resource guide regarding transportation assistance.  She states she is comfortable with her transportation access.  Discussed appointment with general surgeon:  Patient reports having a follow up visit with the general surgeon on 06/05/22.  She states her appointment went very well.  She states hematoma has healed well and no further issues or concerns.   Patients states she only needs to follow up with the general surgeon on an as needed basis.  Advised to contact her primary care provider or RN case manager if further care coordination services needed.             SDOH assessments and interventions completed:  No     Care Coordination Interventions:  Yes, provided   Follow up plan: No further intervention required.   Encounter Outcome:  Pt. Visit Completed   Quinn Plowman RN,BSN,CCM [place] [phone] direct line

## 2022-06-21 DIAGNOSIS — S300XXA Contusion of lower back and pelvis, initial encounter: Secondary | ICD-10-CM | POA: Diagnosis not present

## 2022-06-28 ENCOUNTER — Other Ambulatory Visit: Payer: Self-pay | Admitting: Physician Assistant

## 2022-06-28 DIAGNOSIS — D631 Anemia in chronic kidney disease: Secondary | ICD-10-CM | POA: Diagnosis not present

## 2022-06-28 DIAGNOSIS — M5430 Sciatica, unspecified side: Secondary | ICD-10-CM | POA: Diagnosis not present

## 2022-06-28 DIAGNOSIS — I5032 Chronic diastolic (congestive) heart failure: Secondary | ICD-10-CM | POA: Diagnosis not present

## 2022-06-28 DIAGNOSIS — I2510 Atherosclerotic heart disease of native coronary artery without angina pectoris: Secondary | ICD-10-CM | POA: Diagnosis not present

## 2022-06-28 DIAGNOSIS — Z7901 Long term (current) use of anticoagulants: Secondary | ICD-10-CM | POA: Diagnosis not present

## 2022-06-28 DIAGNOSIS — I739 Peripheral vascular disease, unspecified: Secondary | ICD-10-CM

## 2022-06-28 DIAGNOSIS — I70219 Atherosclerosis of native arteries of extremities with intermittent claudication, unspecified extremity: Secondary | ICD-10-CM | POA: Diagnosis not present

## 2022-06-28 DIAGNOSIS — G4733 Obstructive sleep apnea (adult) (pediatric): Secondary | ICD-10-CM | POA: Diagnosis not present

## 2022-06-28 DIAGNOSIS — F32A Depression, unspecified: Secondary | ICD-10-CM | POA: Diagnosis not present

## 2022-06-28 DIAGNOSIS — F17210 Nicotine dependence, cigarettes, uncomplicated: Secondary | ICD-10-CM | POA: Diagnosis not present

## 2022-06-28 DIAGNOSIS — Z8673 Personal history of transient ischemic attack (TIA), and cerebral infarction without residual deficits: Secondary | ICD-10-CM | POA: Diagnosis not present

## 2022-06-28 DIAGNOSIS — E7800 Pure hypercholesterolemia, unspecified: Secondary | ICD-10-CM | POA: Diagnosis not present

## 2022-06-28 DIAGNOSIS — I130 Hypertensive heart and chronic kidney disease with heart failure and stage 1 through stage 4 chronic kidney disease, or unspecified chronic kidney disease: Secondary | ICD-10-CM | POA: Diagnosis not present

## 2022-06-28 DIAGNOSIS — I480 Paroxysmal atrial fibrillation: Secondary | ICD-10-CM | POA: Diagnosis not present

## 2022-06-28 DIAGNOSIS — Z9181 History of falling: Secondary | ICD-10-CM | POA: Diagnosis not present

## 2022-06-28 DIAGNOSIS — J4489 Other specified chronic obstructive pulmonary disease: Secondary | ICD-10-CM | POA: Diagnosis not present

## 2022-06-28 DIAGNOSIS — Z791 Long term (current) use of non-steroidal anti-inflammatories (NSAID): Secondary | ICD-10-CM | POA: Diagnosis not present

## 2022-06-28 DIAGNOSIS — M79605 Pain in left leg: Secondary | ICD-10-CM

## 2022-06-28 DIAGNOSIS — I350 Nonrheumatic aortic (valve) stenosis: Secondary | ICD-10-CM | POA: Diagnosis not present

## 2022-06-28 DIAGNOSIS — E559 Vitamin D deficiency, unspecified: Secondary | ICD-10-CM | POA: Diagnosis not present

## 2022-06-28 DIAGNOSIS — I70201 Unspecified atherosclerosis of native arteries of extremities, right leg: Secondary | ICD-10-CM

## 2022-06-28 DIAGNOSIS — N1832 Chronic kidney disease, stage 3b: Secondary | ICD-10-CM | POA: Diagnosis not present

## 2022-06-28 DIAGNOSIS — Z7902 Long term (current) use of antithrombotics/antiplatelets: Secondary | ICD-10-CM | POA: Diagnosis not present

## 2022-06-28 DIAGNOSIS — I421 Obstructive hypertrophic cardiomyopathy: Secondary | ICD-10-CM | POA: Diagnosis not present

## 2022-06-28 DIAGNOSIS — S7011XD Contusion of right thigh, subsequent encounter: Secondary | ICD-10-CM | POA: Diagnosis not present

## 2022-06-28 DIAGNOSIS — K219 Gastro-esophageal reflux disease without esophagitis: Secondary | ICD-10-CM | POA: Diagnosis not present

## 2022-06-29 NOTE — Telephone Encounter (Signed)
Can please review that its ok to send

## 2022-06-29 NOTE — Telephone Encounter (Signed)
Med sent.

## 2022-07-06 DIAGNOSIS — Z9181 History of falling: Secondary | ICD-10-CM | POA: Diagnosis not present

## 2022-07-06 DIAGNOSIS — I421 Obstructive hypertrophic cardiomyopathy: Secondary | ICD-10-CM | POA: Diagnosis not present

## 2022-07-06 DIAGNOSIS — I5032 Chronic diastolic (congestive) heart failure: Secondary | ICD-10-CM | POA: Diagnosis not present

## 2022-07-06 DIAGNOSIS — S7011XD Contusion of right thigh, subsequent encounter: Secondary | ICD-10-CM | POA: Diagnosis not present

## 2022-07-06 DIAGNOSIS — M5430 Sciatica, unspecified side: Secondary | ICD-10-CM | POA: Diagnosis not present

## 2022-07-06 DIAGNOSIS — F17210 Nicotine dependence, cigarettes, uncomplicated: Secondary | ICD-10-CM | POA: Diagnosis not present

## 2022-07-06 DIAGNOSIS — I350 Nonrheumatic aortic (valve) stenosis: Secondary | ICD-10-CM | POA: Diagnosis not present

## 2022-07-06 DIAGNOSIS — I2510 Atherosclerotic heart disease of native coronary artery without angina pectoris: Secondary | ICD-10-CM | POA: Diagnosis not present

## 2022-07-06 DIAGNOSIS — I480 Paroxysmal atrial fibrillation: Secondary | ICD-10-CM | POA: Diagnosis not present

## 2022-07-06 DIAGNOSIS — E559 Vitamin D deficiency, unspecified: Secondary | ICD-10-CM | POA: Diagnosis not present

## 2022-07-06 DIAGNOSIS — I130 Hypertensive heart and chronic kidney disease with heart failure and stage 1 through stage 4 chronic kidney disease, or unspecified chronic kidney disease: Secondary | ICD-10-CM | POA: Diagnosis not present

## 2022-07-06 DIAGNOSIS — E7800 Pure hypercholesterolemia, unspecified: Secondary | ICD-10-CM | POA: Diagnosis not present

## 2022-07-06 DIAGNOSIS — G4733 Obstructive sleep apnea (adult) (pediatric): Secondary | ICD-10-CM | POA: Diagnosis not present

## 2022-07-06 DIAGNOSIS — D631 Anemia in chronic kidney disease: Secondary | ICD-10-CM | POA: Diagnosis not present

## 2022-07-06 DIAGNOSIS — Z7902 Long term (current) use of antithrombotics/antiplatelets: Secondary | ICD-10-CM | POA: Diagnosis not present

## 2022-07-06 DIAGNOSIS — I70219 Atherosclerosis of native arteries of extremities with intermittent claudication, unspecified extremity: Secondary | ICD-10-CM | POA: Diagnosis not present

## 2022-07-06 DIAGNOSIS — Z791 Long term (current) use of non-steroidal anti-inflammatories (NSAID): Secondary | ICD-10-CM | POA: Diagnosis not present

## 2022-07-06 DIAGNOSIS — N1832 Chronic kidney disease, stage 3b: Secondary | ICD-10-CM | POA: Diagnosis not present

## 2022-07-06 DIAGNOSIS — Z8673 Personal history of transient ischemic attack (TIA), and cerebral infarction without residual deficits: Secondary | ICD-10-CM | POA: Diagnosis not present

## 2022-07-06 DIAGNOSIS — F32A Depression, unspecified: Secondary | ICD-10-CM | POA: Diagnosis not present

## 2022-07-06 DIAGNOSIS — K219 Gastro-esophageal reflux disease without esophagitis: Secondary | ICD-10-CM | POA: Diagnosis not present

## 2022-07-06 DIAGNOSIS — J4489 Other specified chronic obstructive pulmonary disease: Secondary | ICD-10-CM | POA: Diagnosis not present

## 2022-07-06 DIAGNOSIS — Z7901 Long term (current) use of anticoagulants: Secondary | ICD-10-CM | POA: Diagnosis not present

## 2022-07-13 ENCOUNTER — Other Ambulatory Visit: Payer: Self-pay | Admitting: Physician Assistant

## 2022-07-13 ENCOUNTER — Other Ambulatory Visit: Payer: Self-pay | Admitting: Cardiology

## 2022-07-13 DIAGNOSIS — I480 Paroxysmal atrial fibrillation: Secondary | ICD-10-CM

## 2022-07-16 DIAGNOSIS — G4733 Obstructive sleep apnea (adult) (pediatric): Secondary | ICD-10-CM | POA: Diagnosis not present

## 2022-07-16 NOTE — Telephone Encounter (Signed)
Prescription refill request for Eliquis received.  Indication: afib  Last office visit: Sharolyn Douglas, 04/27/2022 Scr: 0.67, 04/08/2022 Age: 72 yo  Weight: 79.9 kg   Refill sent.

## 2022-07-17 NOTE — Telephone Encounter (Signed)
Please review that ok to send this med

## 2022-07-18 DIAGNOSIS — M5430 Sciatica, unspecified side: Secondary | ICD-10-CM | POA: Diagnosis not present

## 2022-07-18 DIAGNOSIS — Z7902 Long term (current) use of antithrombotics/antiplatelets: Secondary | ICD-10-CM | POA: Diagnosis not present

## 2022-07-18 DIAGNOSIS — Z9181 History of falling: Secondary | ICD-10-CM | POA: Diagnosis not present

## 2022-07-18 DIAGNOSIS — Z791 Long term (current) use of non-steroidal anti-inflammatories (NSAID): Secondary | ICD-10-CM | POA: Diagnosis not present

## 2022-07-18 DIAGNOSIS — F32A Depression, unspecified: Secondary | ICD-10-CM | POA: Diagnosis not present

## 2022-07-18 DIAGNOSIS — I70219 Atherosclerosis of native arteries of extremities with intermittent claudication, unspecified extremity: Secondary | ICD-10-CM | POA: Diagnosis not present

## 2022-07-18 DIAGNOSIS — E559 Vitamin D deficiency, unspecified: Secondary | ICD-10-CM | POA: Diagnosis not present

## 2022-07-18 DIAGNOSIS — I2510 Atherosclerotic heart disease of native coronary artery without angina pectoris: Secondary | ICD-10-CM | POA: Diagnosis not present

## 2022-07-18 DIAGNOSIS — Z8673 Personal history of transient ischemic attack (TIA), and cerebral infarction without residual deficits: Secondary | ICD-10-CM | POA: Diagnosis not present

## 2022-07-18 DIAGNOSIS — I5032 Chronic diastolic (congestive) heart failure: Secondary | ICD-10-CM | POA: Diagnosis not present

## 2022-07-18 DIAGNOSIS — J4489 Other specified chronic obstructive pulmonary disease: Secondary | ICD-10-CM | POA: Diagnosis not present

## 2022-07-18 DIAGNOSIS — F17210 Nicotine dependence, cigarettes, uncomplicated: Secondary | ICD-10-CM | POA: Diagnosis not present

## 2022-07-18 DIAGNOSIS — I421 Obstructive hypertrophic cardiomyopathy: Secondary | ICD-10-CM | POA: Diagnosis not present

## 2022-07-18 DIAGNOSIS — N1832 Chronic kidney disease, stage 3b: Secondary | ICD-10-CM | POA: Diagnosis not present

## 2022-07-18 DIAGNOSIS — S7011XD Contusion of right thigh, subsequent encounter: Secondary | ICD-10-CM | POA: Diagnosis not present

## 2022-07-18 DIAGNOSIS — I130 Hypertensive heart and chronic kidney disease with heart failure and stage 1 through stage 4 chronic kidney disease, or unspecified chronic kidney disease: Secondary | ICD-10-CM | POA: Diagnosis not present

## 2022-07-18 DIAGNOSIS — K219 Gastro-esophageal reflux disease without esophagitis: Secondary | ICD-10-CM | POA: Diagnosis not present

## 2022-07-18 DIAGNOSIS — E7800 Pure hypercholesterolemia, unspecified: Secondary | ICD-10-CM | POA: Diagnosis not present

## 2022-07-18 DIAGNOSIS — Z7901 Long term (current) use of anticoagulants: Secondary | ICD-10-CM | POA: Diagnosis not present

## 2022-07-18 DIAGNOSIS — D631 Anemia in chronic kidney disease: Secondary | ICD-10-CM | POA: Diagnosis not present

## 2022-07-18 DIAGNOSIS — I350 Nonrheumatic aortic (valve) stenosis: Secondary | ICD-10-CM | POA: Diagnosis not present

## 2022-07-18 DIAGNOSIS — I480 Paroxysmal atrial fibrillation: Secondary | ICD-10-CM | POA: Diagnosis not present

## 2022-07-18 DIAGNOSIS — G4733 Obstructive sleep apnea (adult) (pediatric): Secondary | ICD-10-CM | POA: Diagnosis not present

## 2022-07-23 ENCOUNTER — Other Ambulatory Visit: Payer: Self-pay | Admitting: Physician Assistant

## 2022-07-23 DIAGNOSIS — M064 Inflammatory polyarthropathy: Secondary | ICD-10-CM

## 2022-07-24 DIAGNOSIS — Z9181 History of falling: Secondary | ICD-10-CM | POA: Diagnosis not present

## 2022-07-24 DIAGNOSIS — Z7901 Long term (current) use of anticoagulants: Secondary | ICD-10-CM | POA: Diagnosis not present

## 2022-07-24 DIAGNOSIS — F17210 Nicotine dependence, cigarettes, uncomplicated: Secondary | ICD-10-CM | POA: Diagnosis not present

## 2022-07-24 DIAGNOSIS — D631 Anemia in chronic kidney disease: Secondary | ICD-10-CM | POA: Diagnosis not present

## 2022-07-24 DIAGNOSIS — E7800 Pure hypercholesterolemia, unspecified: Secondary | ICD-10-CM | POA: Diagnosis not present

## 2022-07-24 DIAGNOSIS — E559 Vitamin D deficiency, unspecified: Secondary | ICD-10-CM | POA: Diagnosis not present

## 2022-07-24 DIAGNOSIS — N1832 Chronic kidney disease, stage 3b: Secondary | ICD-10-CM | POA: Diagnosis not present

## 2022-07-24 DIAGNOSIS — I130 Hypertensive heart and chronic kidney disease with heart failure and stage 1 through stage 4 chronic kidney disease, or unspecified chronic kidney disease: Secondary | ICD-10-CM | POA: Diagnosis not present

## 2022-07-24 DIAGNOSIS — G4733 Obstructive sleep apnea (adult) (pediatric): Secondary | ICD-10-CM | POA: Diagnosis not present

## 2022-07-24 DIAGNOSIS — M5430 Sciatica, unspecified side: Secondary | ICD-10-CM | POA: Diagnosis not present

## 2022-07-24 DIAGNOSIS — I350 Nonrheumatic aortic (valve) stenosis: Secondary | ICD-10-CM | POA: Diagnosis not present

## 2022-07-24 DIAGNOSIS — Z791 Long term (current) use of non-steroidal anti-inflammatories (NSAID): Secondary | ICD-10-CM | POA: Diagnosis not present

## 2022-07-24 DIAGNOSIS — I5032 Chronic diastolic (congestive) heart failure: Secondary | ICD-10-CM | POA: Diagnosis not present

## 2022-07-24 DIAGNOSIS — K219 Gastro-esophageal reflux disease without esophagitis: Secondary | ICD-10-CM | POA: Diagnosis not present

## 2022-07-24 DIAGNOSIS — S7011XD Contusion of right thigh, subsequent encounter: Secondary | ICD-10-CM | POA: Diagnosis not present

## 2022-07-24 DIAGNOSIS — I480 Paroxysmal atrial fibrillation: Secondary | ICD-10-CM | POA: Diagnosis not present

## 2022-07-24 DIAGNOSIS — I70219 Atherosclerosis of native arteries of extremities with intermittent claudication, unspecified extremity: Secondary | ICD-10-CM | POA: Diagnosis not present

## 2022-07-24 DIAGNOSIS — J4489 Other specified chronic obstructive pulmonary disease: Secondary | ICD-10-CM | POA: Diagnosis not present

## 2022-07-24 DIAGNOSIS — Z8673 Personal history of transient ischemic attack (TIA), and cerebral infarction without residual deficits: Secondary | ICD-10-CM | POA: Diagnosis not present

## 2022-07-24 DIAGNOSIS — I421 Obstructive hypertrophic cardiomyopathy: Secondary | ICD-10-CM | POA: Diagnosis not present

## 2022-07-24 DIAGNOSIS — Z7902 Long term (current) use of antithrombotics/antiplatelets: Secondary | ICD-10-CM | POA: Diagnosis not present

## 2022-07-24 DIAGNOSIS — I2510 Atherosclerotic heart disease of native coronary artery without angina pectoris: Secondary | ICD-10-CM | POA: Diagnosis not present

## 2022-07-24 DIAGNOSIS — F32A Depression, unspecified: Secondary | ICD-10-CM | POA: Diagnosis not present

## 2022-07-26 ENCOUNTER — Telehealth: Payer: Self-pay

## 2022-07-26 NOTE — Telephone Encounter (Signed)
Jenny Reichmann from [facility] called to confirm verbal order for home PT once weekly.

## 2022-08-01 ENCOUNTER — Other Ambulatory Visit: Payer: Self-pay

## 2022-08-01 ENCOUNTER — Other Ambulatory Visit (INDEPENDENT_AMBULATORY_CARE_PROVIDER_SITE_OTHER): Payer: Self-pay | Admitting: Nurse Practitioner

## 2022-08-01 DIAGNOSIS — M064 Inflammatory polyarthropathy: Secondary | ICD-10-CM

## 2022-08-01 DIAGNOSIS — I739 Peripheral vascular disease, unspecified: Secondary | ICD-10-CM

## 2022-08-03 DIAGNOSIS — I480 Paroxysmal atrial fibrillation: Secondary | ICD-10-CM | POA: Diagnosis not present

## 2022-08-03 DIAGNOSIS — Z7902 Long term (current) use of antithrombotics/antiplatelets: Secondary | ICD-10-CM | POA: Diagnosis not present

## 2022-08-03 DIAGNOSIS — D631 Anemia in chronic kidney disease: Secondary | ICD-10-CM | POA: Diagnosis not present

## 2022-08-03 DIAGNOSIS — E559 Vitamin D deficiency, unspecified: Secondary | ICD-10-CM | POA: Diagnosis not present

## 2022-08-03 DIAGNOSIS — F17210 Nicotine dependence, cigarettes, uncomplicated: Secondary | ICD-10-CM | POA: Diagnosis not present

## 2022-08-03 DIAGNOSIS — F32A Depression, unspecified: Secondary | ICD-10-CM | POA: Diagnosis not present

## 2022-08-03 DIAGNOSIS — G4733 Obstructive sleep apnea (adult) (pediatric): Secondary | ICD-10-CM | POA: Diagnosis not present

## 2022-08-03 DIAGNOSIS — E7800 Pure hypercholesterolemia, unspecified: Secondary | ICD-10-CM | POA: Diagnosis not present

## 2022-08-03 DIAGNOSIS — I2510 Atherosclerotic heart disease of native coronary artery without angina pectoris: Secondary | ICD-10-CM | POA: Diagnosis not present

## 2022-08-03 DIAGNOSIS — I130 Hypertensive heart and chronic kidney disease with heart failure and stage 1 through stage 4 chronic kidney disease, or unspecified chronic kidney disease: Secondary | ICD-10-CM | POA: Diagnosis not present

## 2022-08-03 DIAGNOSIS — J4489 Other specified chronic obstructive pulmonary disease: Secondary | ICD-10-CM | POA: Diagnosis not present

## 2022-08-03 DIAGNOSIS — Z9981 Dependence on supplemental oxygen: Secondary | ICD-10-CM | POA: Diagnosis not present

## 2022-08-03 DIAGNOSIS — M5430 Sciatica, unspecified side: Secondary | ICD-10-CM | POA: Diagnosis not present

## 2022-08-03 DIAGNOSIS — S7011XD Contusion of right thigh, subsequent encounter: Secondary | ICD-10-CM | POA: Diagnosis not present

## 2022-08-03 DIAGNOSIS — Z7901 Long term (current) use of anticoagulants: Secondary | ICD-10-CM | POA: Diagnosis not present

## 2022-08-03 DIAGNOSIS — I350 Nonrheumatic aortic (valve) stenosis: Secondary | ICD-10-CM | POA: Diagnosis not present

## 2022-08-03 DIAGNOSIS — K219 Gastro-esophageal reflux disease without esophagitis: Secondary | ICD-10-CM | POA: Diagnosis not present

## 2022-08-03 DIAGNOSIS — Z8673 Personal history of transient ischemic attack (TIA), and cerebral infarction without residual deficits: Secondary | ICD-10-CM | POA: Diagnosis not present

## 2022-08-03 DIAGNOSIS — I421 Obstructive hypertrophic cardiomyopathy: Secondary | ICD-10-CM | POA: Diagnosis not present

## 2022-08-03 DIAGNOSIS — I70219 Atherosclerosis of native arteries of extremities with intermittent claudication, unspecified extremity: Secondary | ICD-10-CM | POA: Diagnosis not present

## 2022-08-03 DIAGNOSIS — I5032 Chronic diastolic (congestive) heart failure: Secondary | ICD-10-CM | POA: Diagnosis not present

## 2022-08-03 DIAGNOSIS — N1832 Chronic kidney disease, stage 3b: Secondary | ICD-10-CM | POA: Diagnosis not present

## 2022-08-03 DIAGNOSIS — Z791 Long term (current) use of non-steroidal anti-inflammatories (NSAID): Secondary | ICD-10-CM | POA: Diagnosis not present

## 2022-08-06 ENCOUNTER — Ambulatory Visit (INDEPENDENT_AMBULATORY_CARE_PROVIDER_SITE_OTHER): Payer: Medicare Other | Admitting: Nurse Practitioner

## 2022-08-06 ENCOUNTER — Encounter (INDEPENDENT_AMBULATORY_CARE_PROVIDER_SITE_OTHER): Payer: Medicare Other

## 2022-08-08 ENCOUNTER — Encounter: Payer: Self-pay | Admitting: Nurse Practitioner

## 2022-08-08 ENCOUNTER — Ambulatory Visit: Payer: 59 | Attending: Nurse Practitioner | Admitting: Nurse Practitioner

## 2022-08-08 VITALS — BP 158/88 | HR 60 | Ht 60.0 in | Wt 181.0 lb

## 2022-08-08 DIAGNOSIS — T148XXD Other injury of unspecified body region, subsequent encounter: Secondary | ICD-10-CM | POA: Diagnosis not present

## 2022-08-08 DIAGNOSIS — F17200 Nicotine dependence, unspecified, uncomplicated: Secondary | ICD-10-CM

## 2022-08-08 DIAGNOSIS — I10 Essential (primary) hypertension: Secondary | ICD-10-CM | POA: Diagnosis not present

## 2022-08-08 DIAGNOSIS — I421 Obstructive hypertrophic cardiomyopathy: Secondary | ICD-10-CM

## 2022-08-08 DIAGNOSIS — T148XXA Other injury of unspecified body region, initial encounter: Secondary | ICD-10-CM

## 2022-08-08 DIAGNOSIS — I739 Peripheral vascular disease, unspecified: Secondary | ICD-10-CM

## 2022-08-08 DIAGNOSIS — I480 Paroxysmal atrial fibrillation: Secondary | ICD-10-CM | POA: Diagnosis not present

## 2022-08-08 DIAGNOSIS — E785 Hyperlipidemia, unspecified: Secondary | ICD-10-CM | POA: Diagnosis not present

## 2022-08-08 DIAGNOSIS — I2510 Atherosclerotic heart disease of native coronary artery without angina pectoris: Secondary | ICD-10-CM | POA: Diagnosis not present

## 2022-08-08 NOTE — Progress Notes (Signed)
Office Visit    Patient Name: Sarah PALELLA Date of Encounter: 08/08/2022  Primary Care Provider:  Mylinda Latina, PA-C Primary Cardiologist:  Glenetta Hew, MD  Chief Complaint    72 y/o ? w/a h/o PAF, nonobstructive CAD, hypertension, hyperlipidemia, tobacco abuse, alcohol abuse, hypertrophic cardiomyopathy, diastolic dysfunction, anxiety, depression, COPD, peripheral arterial disease, and cryptogenic stroke, who presents for A-fib follow-up.  Past Medical History    Past Medical History:  Diagnosis Date   Agoraphobia with panic disorder    Anemia    Arthritis    "all over" (08/13/2016)   Chronic bronchitis (HCC)    COPD (chronic obstructive pulmonary disease) ([place])    Cryptogenic stroke ([place])    a. 06/2020 Head CT: low density caudate nucleus concerning for infarct; b. 07/2020 s/p MDT Linq; c. 11/2020 Finding of Afib on Linq.   Depression    ETOH abuse    GAD (generalized anxiety disorder)    GERD (gastroesophageal reflux disease)    Headache    "daily til I got glasses; now have headache once in awhile" (08/13/2016)   Hepatitis 1960   "isolated &  hospitalized for 2 weeks; don't know which kind of hepatitis"    Hernia, hiatal    High cholesterol    Hypertension    Hypertrophic obstructive cardiomyopathy ([place]) 12/2016   Echo showed increased LV wall thickness with grade 2 mmHg LVOT gradient and SAM along with GRII DD.  Consistent with HOCM.   Mild aortic stenosis    a. 01/2019 Echo: EF 55-60%. Mild AS; b. 12/2019 Echo: Nl EF. Mild diast dysfxn. Trace AS/AI/TR. Mild MR.   Nonobstructive CAD (coronary artery disease)    a. 12/2016 Cath: LM nl, LAD min irregs, LCX nl, OM1/2 min irregs, RCA nl, RPDA min irregs. EF 55-65%.   PAD (peripheral artery disease) ([place])    a. 09/29/2020 s/p PTA/DBA to L prox/mid SFA and stenting of L SFA; b. 10/06/2020 PTA of R TP trunk and prox peroenal. PTA/DBA of R SFA and prox R Popliteal. R SFA stenting x 2; c. 10/2020 ABI: R 0.92, L 0.93.    PAF (paroxysmal atrial fibrillation) ([place])    a.11/2020 PAF noted on Linq; b. CHA2DS2VASc = 6-->eliquis.   Pneumonia    "once or twice" (08/13/2016)   PONV (postoperative nausea and vomiting)    Sciatica    Tobacco abuse    Past Surgical History:  Procedure Laterality Date   ANAL FISTULECTOMY  1970s X 3   BACK SURGERY     Cardiac MRI     Hypertrophic cardiomyopathy with septal thickness of 19 mm. EF 72%, moderate MR. . Moderate LAD. Partial fusion of right and left cusps of the aortic valve- no stenosis. LVOT turbulence , transient SAM   COLONOSCOPY WITH PROPOFOL N/A 04/24/2021   Procedure: COLONOSCOPY WITH PROPOFOL;  Surgeon: [name], MD;  Location: [facility] ENDOSCOPY;  Service: Gastroenterology;  Laterality: N/A;   COLONOSCOPY WITH PROPOFOL N/A 06/15/2021   Procedure: COLONOSCOPY WITH PROPOFOL;  Surgeon: [name], MD;  Location: [facility] ENDOSCOPY;  Service: Gastroenterology;  Laterality: N/A;   FRACTURE SURGERY     HERNIA REPAIR     KNEE ARTHROSCOPY Right    LAPAROSCOPIC CHOLECYSTECTOMY     LAPAROSCOPIC INCISIONAL / UMBILICAL / VENTRAL HERNIA REPAIR  08/13/2016   [place] w/mesh   LOWER EXTREMITY ANGIOGRAPHY Left 09/29/2020   Procedure: LOWER EXTREMITY ANGIOGRAPHY;  Surgeon: Algernon Huxley, MD;  Location: [place] CV LAB;  Service: Cardiovascular;  Laterality: Left;   LOWER EXTREMITY ANGIOGRAPHY Right 10/06/2020   Procedure: LOWER EXTREMITY ANGIOGRAPHY;  Surgeon: Algernon Huxley, MD;  Location: [place] CV LAB;  Service: Cardiovascular;  Laterality: Right;   LUMBAR DISC SURGERY     "herniated disc"   NASAL FRACTURE SURGERY  1970s X 2   RIGHT/LEFT HEART CATH AND CORONARY ANGIOGRAPHY N/A 12/27/2016   Procedure: RIGHT/LEFT HEART CATH AND CORONARY ANGIOGRAPHY;  Surgeon: Leonie Man, MD;  Location: [place] INVASIVE CV LAB: Mild-moderate pulmonary hypertension.  Hyperdynamic ventricle noted.  EF 55-65% -hyperdynamic (unable to measure LVOT gradient).  Mildly elevated very tortuous but  angiographically normal coronary arteries, suggesting hypertensive heart disease   SHOULDER ARTHROSCOPY WITH ROTATOR CUFF REPAIR Right 1990s?   TONSILLECTOMY  1951   TRANSTHORACIC ECHOCARDIOGRAM  01/05/2017    EF 60-65% with dynamic outflow tract obstruction at rest. Peak gradient 52 mmHg consistent with HOCM.  GRII DD area. Aortic sclerosis but no stenosis. Systolic anterior motion of mitral valve chordae. Mild mitral stenosis. Moderate LA dilation and mild RA dilation. Peak PA pressures 35 mmHg.   TRANSTHORACIC ECHOCARDIOGRAM  01/08/2020   [facility]: EF 55 to 60%.  GR 1 DD.  Nodularity calcification.  Trivial AS/AR.  Mild MR   VENTRAL HERNIA REPAIR N/A 08/13/2016   Procedure: LAPAROSCOPIC VENTRAL HERNIA REPAIR WITH MESH;  Surgeon: Judeth Horn, MD;  Location: [facility];  Service: General;  Laterality: N/A;    Allergies  No Known Allergies  History of Present Illness    72 year old female with the above past medical history including paroxysmal atrial fibrillation, nonobstructive CAD, hypertension, hyperlipidemia, tobacco abuse, alcohol use, HCM, diastolic dysfunction, anxiety, depression, COPD, peripheral arterial disease, and cryptogenic stroke.  She previously underwent diagnostic catheterization August 2018, showing minor irregularities in the LAD, obtuse marginal branches, and RPDA, with normal LV function.  She was medically managed.  Echo in August 2021 showed normal LV function with mild diastolic dysfunction, and mild mitral regurgitation.  In February 2022, she was admitted to [facility] with right-sided facial numbness and blurred vision.  CT showed infarct involving the caudate nucleus.  TEE was not performed.  She subsequently underwent implantable loop recorder in March 2022.  In May 2022, in the setting of claudication, she underwent peripheral angiography, revealing severe bilateral lower extremity disease with staged PTA and stenting of the left lower extremity on May 12, followed by PTA and  stenting of the right lower extremity on May 19.  Follow ABI in June 2022, was normal.  In July 2022, she was found to have paroxysmal atrial fibrillation on her loop recorder and was placed on oral anticoagulation.  She was seen by electrophysiology with plan for continuation of beta-blocker and oral anticoagulation therapy and consideration for antiarrhythmic, if burden of A-fib increases in the future.  Ms. Gookin was last seen in cardiology clinic in December 2023 following a fall in November 2023 resulting in a large right buttock hematoma.  She was also just coming off of a steroid taper in the setting of dyspnea and wheezing.  She was still smoking 1 pack a day.  She was doing well from a cardiac standpoint at that time with ongoing drainage from her right buttock.  She has since followed up with general surgery with only clear drainage noted in January, and she has continued to be managed conservatively.  From a cardiac standpoint, Ms. Koskinen has generally done well.  She has chronic, stable dyspnea on exertion which has not changed recently.  She  does not experience chest pain and denies palpitations, PND, orthopnea, dizziness, syncope, edema, or early satiety.  She has been dealing with unsteadiness on her feet, which has caused her to fall at least 1 additional time since last year.  She notes that she is very anxious when she has to get in the bathtub because it is slippery and she is afraid she might fall.  She continues to smoke close to 1 pack/day.  She was recently noted occasional left lateral (along the left axillary line) upper abdominal discomfort that occurs while lying in bed and is worse with palpation.  She has had about 2 episodes of this over the past 2 weeks, and this resolved spontaneously.  Home Medications    Current Outpatient Medications  Medication Sig Dispense Refill   albuterol (VENTOLIN HFA) 108 (90 Base) MCG/ACT inhaler Inhale 2 puffs into the lungs every 6 (six)  hours as needed for wheezing or shortness of breath. 8 g 2   apixaban (ELIQUIS) 5 MG TABS tablet TAKE 1 TABLET BY MOUTH TWICE  DAILY 200 tablet 1   atorvastatin (LIPITOR) 40 MG tablet TAKE 1 TABLET BY MOUTH DAILY 100 tablet 2   buPROPion (WELLBUTRIN XL) 150 MG 24 hr tablet TAKE 1 TABLET BY MOUTH  DAILY 90 tablet 3   busPIRone (BUSPAR) 15 MG tablet TAKE 1 TABLET BY MOUTH  TWICE DAILY 180 tablet 1   carvedilol (COREG) 12.5 MG tablet Take 1.5 tablets (18.75 mg total) by mouth 2 (two) times daily. 300 tablet 2   celecoxib (CELEBREX) 100 MG capsule TAKE 1 CAPSULE BY MOUTH DAILY 100 capsule 2   cilostazol (PLETAL) 50 MG tablet TAKE 1 TABLET BY MOUTH TWICE  DAILY 200 tablet 2   clopidogrel (PLAVIX) 75 MG tablet TAKE 1 TABLET BY MOUTH  DAILY 90 tablet 3   escitalopram (LEXAPRO) 10 MG tablet TAKE 1 TABLET BY MOUTH DAILY 90 tablet 3   fenofibrate micronized (LOFIBRA) 134 MG capsule Take 1 capsule (134 mg total) by mouth daily before breakfast. 90 capsule 3   folic acid (FOLVITE) 1 MG tablet Take 1 tablet (1 mg total) by mouth daily. 30 tablet 0   furosemide (LASIX) 40 MG tablet TAKE 1 TABLET BY MOUTH DAILY 100 tablet 2   gabapentin (NEURONTIN) 100 MG capsule TAKE 2 CAPSULES BY MOUTH IN THE  MORNING AND AT NIGHT; AND 1  CAPSULE IN THE AFTERNOON 500 capsule 2   hydrOXYzine (VISTARIL) 25 MG capsule Take one tab po qd prn for anxiety 90 capsule 1   Multiple Vitamin (MULTIVITAMIN WITH MINERALS) TABS tablet Take 1 tablet by mouth daily.     pantoprazole (PROTONIX) 40 MG tablet TAKE 1 TABLET BY MOUTH DAILY 100 tablet 2   traZODone (DESYREL) 100 MG tablet TAKE 1 TABLET BY MOUTH DAILY AT  BEDTIME 90 tablet 3   valsartan (DIOVAN) 160 MG tablet TAKE 1 TABLET BY MOUTH DAILY 100 tablet 2   No current facility-administered medications for this visit.     Review of Systems   2 episodes of left lateral abdominal tenderness.  She has some degree of chronic dyspnea exertion.  She has chronic arthritic pain impacting  all joints.  Unsteady gait.  She denies chest pain, palpitations, PND, orthopnea, dizziness, syncope, edema, or early satiety.  All other systems reviewed and are otherwise negative except as noted above.    Physical Exam    VS:  BP (!) 158/88   Pulse 60   Ht 5' (1.524 m)  Wt 181 lb (82.1 kg)   SpO2 95%   BMI 35.35 kg/m  , BMI Body mass index is 35.35 kg/m.     Vitals:   08/08/22 0906 08/08/22 0951  BP: (!) 174/82 (!) 158/88  Pulse: 60   SpO2: 95%     GEN: Well nourished, well developed, in no acute distress. HEENT: normal. Neck: Supple, no JVD, carotid bruits, or masses. Cardiac: RRR, 2/6 systolic ejection murmur heard throughout. No clubbing, cyanosis, edema.  Radials 2+/PT 2+ and equal bilaterally.  Respiratory:  Respirations regular and unlabored, clear to auscultation bilaterally. GI: Soft, nontender, nondistended, BS + x 4. MS: no deformity or atrophy. Skin: warm and dry, no rash. Neuro:  Strength and sensation are intact. Psych: Normal affect.  Accessory Clinical Findings    ECG personally reviewed by me today - RSR, 60 - no acute changes.  Lab Results  Component Value Date   WBC 9.0 04/27/2022   HGB 14.9 04/27/2022   HCT 44.7 04/27/2022   MCV 95.9 04/27/2022   PLT 175 04/27/2022   Lab Results  Component Value Date   CREATININE 0.67 04/08/2022   BUN 9 04/08/2022   NA 141 04/08/2022   K 3.2 (L) 04/08/2022   CL 105 04/08/2022   CO2 26 04/08/2022   Lab Results  Component Value Date   ALT 16 06/29/2021   AST 22 06/29/2021   ALKPHOS 50 06/29/2021   BILITOT 0.7 06/29/2021   Lab Results  Component Value Date   CHOL 210 (H) 04/27/2022   HDL 54 04/27/2022   LDLCALC 91 04/27/2022   LDLDIRECT 66.0 10/20/2015   TRIG 327 (H) 04/27/2022   CHOLHDL 3.9 04/27/2022    Lab Results  Component Value Date   HGBA1C 5.1 07/05/2020    Assessment & Plan    1.  Paroxysmal atrial fibrillation: Maintaining sinus rhythm today without any recent palpitations.  She  remains on Eliquis and beta-blocker therapy with stable H&H, BUN, and creatinine in November and December last year.  2.  Essential hypertension: Blood pressure is elevated today however, she indicates that she has not taken her morning medication.  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  Have encouraged her to go home and take her medicine and continue to follow blood pressure at home.  Continue beta-blocker, ARB, and Lasix.  3.  Nonobstructive CAD/atypical left-sided pain: Diagnostic catheterization August 2018 showed minor irregularities in the LAD, obtuse marginal, and RPDA.  She does not experience chest pain but recently has been having episodic left lateral abdominal discomfort along the mid axillary line, which is reproducible with palpation.  No further ischemic workup warranted at this time.  Encouraged her to follow-up with primary care.  She remains on beta-blocker, statin, and ARB.  No aspirin in the setting of Eliquis.  4.  Peripheral arterial disease: Status post bilateral lower extremity angioplasty and stenting in May 2022.  Followed by vascular surgery.  5.  Hyperlipidemia: LDL of 91 in December 2023.  She remains on atorvastatin therapy at 40 mg.  Encouraged more regular exercise, weight loss, and plant-based diet.  6.  Tobacco abuse: Continues to smoke about a pack a day.  She is contemplating quitting and says that she has patches at home.  Strongly encouraged her to use patches when she is ready to quit and she understands that she does not smoke cigarettes while wearing  a patch.  7.  History of stroke: Has had some unsteadiness of her gait with falls.  She is using a cane and also has a walker at home.  She is receiving physical therapy and I strongly encouraged her to follow exercise prescribed by her therapist in order to increase leg strength and stability.  She remains on  statin and Eliquis therapy.  8.  Right buttock hematoma: This is more or less resolved.  She has been followed by general surgery over the past few months and been managed conservatively.  9.  Hypertrophic cardiomyopathy: 26 mmHg at gradient on echo in 2017.  Will arrange for follow-up.  10.  Disposition: Follow-up in clinic in 6 months or sooner if necessary.  Murray Hodgkins, NP 08/08/2022, 11:13 AM

## 2022-08-08 NOTE — Patient Instructions (Addendum)
Medication Instructions:  No changes *If you need a refill on your cardiac medications before your next appointment, please call your pharmacy*   Lab Work: None ordered If you have labs (blood work) drawn today and your tests are completely normal, you will receive your results only by: [place] (if you have MyChart) OR A paper copy in the mail If you have any lab test that is abnormal or we need to change your treatment, we will call you to review the results.   Testing/Procedures: Your physician has requested that you have an echocardiogram. Echocardiography is a painless test that uses sound waves to create images of your heart. It provides your doctor with information about the size and shape of your heart and how well your heart's chambers and valves are working.   You may receive an ultrasound enhancing agent through an IV if needed to better visualize your heart during the echo. This procedure takes approximately one hour.  There are no restrictions for this procedure.  This will take place at [facility] ([address]) [id number], [place]    Follow-Up: At [facility], you and your health needs are our priority.  As part of our continuing mission to provide you with exceptional heart care, we have created designated Provider Care Teams.  These Care Teams include your primary Cardiologist (physician) and Advanced Practice Providers (APPs -  Physician Assistants and Nurse Practitioners) who all work together to provide you with the care you need, when you need it.  We recommend signing up for the patient portal called "MyChart".  Sign up information is provided on this After Visit Summary.  MyChart is used to connect with patients for Virtual Visits (Telemedicine).  Patients are able to view lab/test results, encounter notes, upcoming appointments, etc.  Non-urgent messages can be sent to your provider as well.   To learn more about what you can  do with MyChart, go to [email or web address].    Your next appointment:   6 month(s)  Provider:   You may see Glenetta Hew, MD or one of the following Advanced Practice Providers on your designated Care Team:   Murray Hodgkins, NP Christell Faith, PA-C Cadence Kathlen Mody, PA-C Gerrie Nordmann, NP    Managing the Challenge of Quitting Smoking Quitting smoking is a physical and mental challenge. You may have cravings, withdrawal symptoms, and temptation to smoke. Before quitting, work with your health care provider to make a plan that can help you manage quitting. Making a plan before you quit may keep you from smoking when you have the urge to smoke while trying to quit. How to manage lifestyle changes Managing stress Stress can make you want to smoke, and wanting to smoke may cause stress. It is important to find ways to manage your stress. You could try some of the following: Practice relaxation techniques. Breathe slowly and deeply, in through your nose and out through your mouth. Listen to music. Soak in a bath or take a shower. Imagine a peaceful place or vacation. Get some support. Talk with family or friends about your stress. Join a support group. Talk with a counselor or therapist. Get some physical activity. Go for a walk, run, or bike ride. Play a favorite sport. Practice yoga.  Medicines Talk with your health care provider about medicines that might help you deal with cravings and make quitting easier for you. Relationships Social situations can be difficult when you are quitting smoking. To manage this, you can:  Avoid parties and other social situations where people might be smoking. Avoid alcohol. Leave right away if you have the urge to smoke. Explain to your family and friends that you are quitting smoking. Ask for support and let them know you might be a bit grumpy. Plan activities where smoking is not an option. General instructions Be aware that many people gain  weight after they quit smoking. However, not everyone does. To keep from gaining weight, have a plan in place before you quit, and stick to the plan after you quit. Your plan should include: Eating healthy snacks. When you have a craving, it may help to: Eat popcorn, or try carrots, celery, or other cut vegetables. Chew sugar-free gum. Changing how you eat. Eat small portion sizes at meals. Eat 4-6 small meals throughout the day instead of 1-2 large meals a day. Be mindful when you eat. You should avoid watching television or doing other things that might distract you as you eat. Exercising regularly. Make time to exercise each day. If you do not have time for a long workout, do short bouts of exercise for 5-10 minutes several times a day. Do some form of strengthening exercise, such as weight lifting. Do some exercise that gets your heart beating and causes you to breathe deeply, such as walking fast, running, swimming, or biking. This is very important. Drinking plenty of water or other low-calorie or no-calorie drinks. Drink enough fluid to keep your urine pale yellow.  How to recognize withdrawal symptoms Your body and mind may experience discomfort as you try to get used to not having nicotine in your system. These effects are called withdrawal symptoms. They may include: Feeling hungrier than normal. Having trouble concentrating. Feeling irritable or restless. Having trouble sleeping. Feeling depressed. Craving a cigarette. These symptoms may surprise you, but they are normal to have when quitting smoking. To manage withdrawal symptoms: Avoid places, people, and activities that trigger your cravings. Remember why you want to quit. Get plenty of sleep. Avoid coffee and other drinks that contain caffeine. These may worsen some of your symptoms. How to manage cravings Come up with a plan for how to deal with your cravings. The plan should include the following: A definition of the  specific situation you want to deal with. An activity or action you will take to replace smoking. A clear idea for how this action will help. The name of someone who could help you with this. Cravings usually last for 5-10 minutes. Consider taking the following actions to help you with your plan to deal with cravings: Keep your mouth busy. Chew sugar-free gum. Suck on hard candies or a straw. Brush your teeth. Keep your hands and body busy. Change to a different activity right away. Squeeze or play with a ball. Do an activity or a hobby, such as making bead jewelry, practicing needlepoint, or working with wood. Mix up your normal routine. Take a short exercise break. Go for a quick walk, or run up and down stairs. Focus on doing something kind or helpful for someone else. Call a friend or family member to talk during a craving. Join a support group. Contact a quitline. Where to find support To get help or find a support group: Call the [facility]'s Smoking Quitline: 1-800-QUIT-NOW 660-492-8621) Text QUIT to SmokefreeTXTMQ:317211 Where to find more information Visit these websites to find more information on quitting smoking: U.S. Department of Health and Human Services: www.smokefree.gov American Lung Association: www.freedomfromsmoking.org Centers for  Disease Control and Prevention (CDC): http://www.wolf.info/ American Heart Association: www.heart.org Contact a health care provider if: You want to change your plan for quitting. The medicines you are taking are not helping. Your eating feels out of control or you cannot sleep. You feel depressed or become very anxious. Summary Quitting smoking is a physical and mental challenge. You will face cravings, withdrawal symptoms, and temptation to smoke again. Preparation can help you as you go through these challenges. Try different techniques to manage stress, handle social situations, and prevent weight gain. You can deal with  cravings by keeping your mouth busy (such as by chewing gum), keeping your hands and body busy, calling family or friends, or contacting a quitline for people who want to quit smoking. You can deal with withdrawal symptoms by avoiding places where people smoke, getting plenty of rest, and avoiding drinks that contain caffeine. This information is not intended to replace advice given to you by your health care provider. Make sure you discuss any questions you have with your health care provider. Document Revised: 04/28/2021 Document Reviewed: 04/28/2021 Elsevier Patient Education  [place].

## 2022-08-09 DIAGNOSIS — F32A Depression, unspecified: Secondary | ICD-10-CM | POA: Diagnosis not present

## 2022-08-09 DIAGNOSIS — F17210 Nicotine dependence, cigarettes, uncomplicated: Secondary | ICD-10-CM | POA: Diagnosis not present

## 2022-08-09 DIAGNOSIS — M5430 Sciatica, unspecified side: Secondary | ICD-10-CM | POA: Diagnosis not present

## 2022-08-09 DIAGNOSIS — Z7901 Long term (current) use of anticoagulants: Secondary | ICD-10-CM | POA: Diagnosis not present

## 2022-08-09 DIAGNOSIS — Z7902 Long term (current) use of antithrombotics/antiplatelets: Secondary | ICD-10-CM | POA: Diagnosis not present

## 2022-08-09 DIAGNOSIS — G4733 Obstructive sleep apnea (adult) (pediatric): Secondary | ICD-10-CM | POA: Diagnosis not present

## 2022-08-09 DIAGNOSIS — Z8673 Personal history of transient ischemic attack (TIA), and cerebral infarction without residual deficits: Secondary | ICD-10-CM | POA: Diagnosis not present

## 2022-08-09 DIAGNOSIS — D631 Anemia in chronic kidney disease: Secondary | ICD-10-CM | POA: Diagnosis not present

## 2022-08-09 DIAGNOSIS — E559 Vitamin D deficiency, unspecified: Secondary | ICD-10-CM | POA: Diagnosis not present

## 2022-08-09 DIAGNOSIS — E7800 Pure hypercholesterolemia, unspecified: Secondary | ICD-10-CM | POA: Diagnosis not present

## 2022-08-09 DIAGNOSIS — K219 Gastro-esophageal reflux disease without esophagitis: Secondary | ICD-10-CM | POA: Diagnosis not present

## 2022-08-09 DIAGNOSIS — I421 Obstructive hypertrophic cardiomyopathy: Secondary | ICD-10-CM | POA: Diagnosis not present

## 2022-08-09 DIAGNOSIS — Z791 Long term (current) use of non-steroidal anti-inflammatories (NSAID): Secondary | ICD-10-CM | POA: Diagnosis not present

## 2022-08-09 DIAGNOSIS — N1832 Chronic kidney disease, stage 3b: Secondary | ICD-10-CM | POA: Diagnosis not present

## 2022-08-09 DIAGNOSIS — I350 Nonrheumatic aortic (valve) stenosis: Secondary | ICD-10-CM | POA: Diagnosis not present

## 2022-08-09 DIAGNOSIS — J4489 Other specified chronic obstructive pulmonary disease: Secondary | ICD-10-CM | POA: Diagnosis not present

## 2022-08-09 DIAGNOSIS — I70219 Atherosclerosis of native arteries of extremities with intermittent claudication, unspecified extremity: Secondary | ICD-10-CM | POA: Diagnosis not present

## 2022-08-09 DIAGNOSIS — S7011XD Contusion of right thigh, subsequent encounter: Secondary | ICD-10-CM | POA: Diagnosis not present

## 2022-08-09 DIAGNOSIS — I480 Paroxysmal atrial fibrillation: Secondary | ICD-10-CM | POA: Diagnosis not present

## 2022-08-09 DIAGNOSIS — Z9981 Dependence on supplemental oxygen: Secondary | ICD-10-CM | POA: Diagnosis not present

## 2022-08-09 DIAGNOSIS — I130 Hypertensive heart and chronic kidney disease with heart failure and stage 1 through stage 4 chronic kidney disease, or unspecified chronic kidney disease: Secondary | ICD-10-CM | POA: Diagnosis not present

## 2022-08-09 DIAGNOSIS — I2510 Atherosclerotic heart disease of native coronary artery without angina pectoris: Secondary | ICD-10-CM | POA: Diagnosis not present

## 2022-08-09 DIAGNOSIS — I5032 Chronic diastolic (congestive) heart failure: Secondary | ICD-10-CM | POA: Diagnosis not present

## 2022-08-14 DIAGNOSIS — G4733 Obstructive sleep apnea (adult) (pediatric): Secondary | ICD-10-CM | POA: Diagnosis not present

## 2022-08-15 ENCOUNTER — Telehealth: Payer: Self-pay | Admitting: Nurse Practitioner

## 2022-08-15 DIAGNOSIS — I421 Obstructive hypertrophic cardiomyopathy: Secondary | ICD-10-CM

## 2022-08-15 DIAGNOSIS — I2510 Atherosclerotic heart disease of native coronary artery without angina pectoris: Secondary | ICD-10-CM

## 2022-08-15 DIAGNOSIS — D631 Anemia in chronic kidney disease: Secondary | ICD-10-CM | POA: Diagnosis not present

## 2022-08-15 DIAGNOSIS — F32A Depression, unspecified: Secondary | ICD-10-CM | POA: Diagnosis not present

## 2022-08-15 DIAGNOSIS — Z7901 Long term (current) use of anticoagulants: Secondary | ICD-10-CM | POA: Diagnosis not present

## 2022-08-15 DIAGNOSIS — Z8673 Personal history of transient ischemic attack (TIA), and cerebral infarction without residual deficits: Secondary | ICD-10-CM | POA: Diagnosis not present

## 2022-08-15 DIAGNOSIS — N1832 Chronic kidney disease, stage 3b: Secondary | ICD-10-CM | POA: Diagnosis not present

## 2022-08-15 DIAGNOSIS — I350 Nonrheumatic aortic (valve) stenosis: Secondary | ICD-10-CM | POA: Diagnosis not present

## 2022-08-15 DIAGNOSIS — I130 Hypertensive heart and chronic kidney disease with heart failure and stage 1 through stage 4 chronic kidney disease, or unspecified chronic kidney disease: Secondary | ICD-10-CM | POA: Diagnosis not present

## 2022-08-15 DIAGNOSIS — I70213 Atherosclerosis of native arteries of extremities with intermittent claudication, bilateral legs: Secondary | ICD-10-CM

## 2022-08-15 DIAGNOSIS — I480 Paroxysmal atrial fibrillation: Secondary | ICD-10-CM

## 2022-08-15 DIAGNOSIS — G4733 Obstructive sleep apnea (adult) (pediatric): Secondary | ICD-10-CM | POA: Diagnosis not present

## 2022-08-15 DIAGNOSIS — M5430 Sciatica, unspecified side: Secondary | ICD-10-CM | POA: Diagnosis not present

## 2022-08-15 DIAGNOSIS — F17210 Nicotine dependence, cigarettes, uncomplicated: Secondary | ICD-10-CM | POA: Diagnosis not present

## 2022-08-15 DIAGNOSIS — E7800 Pure hypercholesterolemia, unspecified: Secondary | ICD-10-CM | POA: Diagnosis not present

## 2022-08-15 DIAGNOSIS — Z9981 Dependence on supplemental oxygen: Secondary | ICD-10-CM | POA: Diagnosis not present

## 2022-08-15 DIAGNOSIS — Z7902 Long term (current) use of antithrombotics/antiplatelets: Secondary | ICD-10-CM | POA: Diagnosis not present

## 2022-08-15 DIAGNOSIS — S7011XD Contusion of right thigh, subsequent encounter: Secondary | ICD-10-CM | POA: Diagnosis not present

## 2022-08-15 DIAGNOSIS — I739 Peripheral vascular disease, unspecified: Secondary | ICD-10-CM

## 2022-08-15 DIAGNOSIS — Z791 Long term (current) use of non-steroidal anti-inflammatories (NSAID): Secondary | ICD-10-CM | POA: Diagnosis not present

## 2022-08-15 DIAGNOSIS — I5032 Chronic diastolic (congestive) heart failure: Secondary | ICD-10-CM

## 2022-08-15 DIAGNOSIS — I70219 Atherosclerosis of native arteries of extremities with intermittent claudication, unspecified extremity: Secondary | ICD-10-CM | POA: Diagnosis not present

## 2022-08-15 DIAGNOSIS — I639 Cerebral infarction, unspecified: Secondary | ICD-10-CM

## 2022-08-15 DIAGNOSIS — J4489 Other specified chronic obstructive pulmonary disease: Secondary | ICD-10-CM | POA: Diagnosis not present

## 2022-08-15 DIAGNOSIS — K219 Gastro-esophageal reflux disease without esophagitis: Secondary | ICD-10-CM | POA: Diagnosis not present

## 2022-08-15 DIAGNOSIS — E559 Vitamin D deficiency, unspecified: Secondary | ICD-10-CM | POA: Diagnosis not present

## 2022-08-15 MED ORDER — VALSARTAN 320 MG PO TABS: 320.0000 mg | ORAL_TABLET | Freq: Every day | ORAL | 3 refills | Status: DC

## 2022-08-15 NOTE — Telephone Encounter (Signed)
Spoke with patient and reviewed provider recommendations to increase Valsartan to 320 mg once daily with repeat labs in 1 week over at the [facility]. Inquired if she had enough of the 160 mg to take 2 tablets daily until she can get her 320 mg pills from mail order and she reports she has enough. Will send in prescription to mail order and lab orders are entered. She verbalized understanding to have labs done over at the [facility] in one week and she had no further questions about all of these instructions.

## 2022-08-15 NOTE — Telephone Encounter (Signed)
Called place to [place], home nurse. She stated that she goes to the patient's house on a weekly basis. The last several times her blood pressure has been in the 150/80's. Today it was 158/82. The patient has complaints of a headache.  The nurse stated that the patient is taking Valsartan 160 mg in the morning and Carvedilol 25 mg in the morning and 12.5 mg in the evening.  Call placed to the patient. She stated that she does not check her blood pressure at home. She had taken her blood pressure medicine this morning at 8 am and the nurse checked her pressure at 10 am.

## 2022-08-15 NOTE — Telephone Encounter (Signed)
Pt c/o BP issue: STAT if pt c/o blurred vision, one-sided weakness or slurred speech  1. What are your last 5 BP readings?  Last week 158/88 Today 158/82  2. Are you having any other symptoms (ex. Dizziness, headache, blurred vision, passed out)? Headaches & palpitations    3. What is your BP issue?  Reports BP has been up last couple times she has seen the pt. Pt denise any pounding sensation in chest.   Callback number listed is secure to leave a VM if she does not answer when calling back.

## 2022-08-15 NOTE — Telephone Encounter (Signed)
Spoke with Aldona Bar and reviewed provider recommendations to increase medication and had repeat labs in one week. She requested that we call patient to update her medication and lab request. No further needs.

## 2022-08-15 NOTE — Telephone Encounter (Signed)
Patient took extra pill and experienced some dizziness. Encouraged her to continue to see if those symptoms resolve. Instructed her to have those labs in one week. Encouraged her to please give [place] a call back if those symptoms persist or worsen. She verbalized understanding with no further questions at this time.

## 2022-08-15 NOTE — Telephone Encounter (Signed)
Pt states states she took the extra Valsartan and she is very dizzy. She is unsure what she should do, please advise

## 2022-08-15 NOTE — Telephone Encounter (Signed)
Thank you for BP feedback.  Please have Ms. Bartle increase her valsartan to 320 mg daily.  She should have a bmet in 1 wk.

## 2022-08-23 DIAGNOSIS — I130 Hypertensive heart and chronic kidney disease with heart failure and stage 1 through stage 4 chronic kidney disease, or unspecified chronic kidney disease: Secondary | ICD-10-CM | POA: Diagnosis not present

## 2022-08-23 DIAGNOSIS — N1832 Chronic kidney disease, stage 3b: Secondary | ICD-10-CM | POA: Diagnosis not present

## 2022-08-23 DIAGNOSIS — M5430 Sciatica, unspecified side: Secondary | ICD-10-CM | POA: Diagnosis not present

## 2022-08-23 DIAGNOSIS — E7800 Pure hypercholesterolemia, unspecified: Secondary | ICD-10-CM | POA: Diagnosis not present

## 2022-08-23 DIAGNOSIS — E559 Vitamin D deficiency, unspecified: Secondary | ICD-10-CM | POA: Diagnosis not present

## 2022-08-23 DIAGNOSIS — Z9981 Dependence on supplemental oxygen: Secondary | ICD-10-CM | POA: Diagnosis not present

## 2022-08-23 DIAGNOSIS — D631 Anemia in chronic kidney disease: Secondary | ICD-10-CM | POA: Diagnosis not present

## 2022-08-23 DIAGNOSIS — I70219 Atherosclerosis of native arteries of extremities with intermittent claudication, unspecified extremity: Secondary | ICD-10-CM | POA: Diagnosis not present

## 2022-08-23 DIAGNOSIS — F17210 Nicotine dependence, cigarettes, uncomplicated: Secondary | ICD-10-CM | POA: Diagnosis not present

## 2022-08-23 DIAGNOSIS — Z791 Long term (current) use of non-steroidal anti-inflammatories (NSAID): Secondary | ICD-10-CM | POA: Diagnosis not present

## 2022-08-23 DIAGNOSIS — F32A Depression, unspecified: Secondary | ICD-10-CM | POA: Diagnosis not present

## 2022-08-23 DIAGNOSIS — Z7902 Long term (current) use of antithrombotics/antiplatelets: Secondary | ICD-10-CM | POA: Diagnosis not present

## 2022-08-23 DIAGNOSIS — I2510 Atherosclerotic heart disease of native coronary artery without angina pectoris: Secondary | ICD-10-CM | POA: Diagnosis not present

## 2022-08-23 DIAGNOSIS — J4489 Other specified chronic obstructive pulmonary disease: Secondary | ICD-10-CM | POA: Diagnosis not present

## 2022-08-23 DIAGNOSIS — S7011XD Contusion of right thigh, subsequent encounter: Secondary | ICD-10-CM | POA: Diagnosis not present

## 2022-08-23 DIAGNOSIS — I350 Nonrheumatic aortic (valve) stenosis: Secondary | ICD-10-CM | POA: Diagnosis not present

## 2022-08-23 DIAGNOSIS — Z8673 Personal history of transient ischemic attack (TIA), and cerebral infarction without residual deficits: Secondary | ICD-10-CM | POA: Diagnosis not present

## 2022-08-23 DIAGNOSIS — I5032 Chronic diastolic (congestive) heart failure: Secondary | ICD-10-CM | POA: Diagnosis not present

## 2022-08-23 DIAGNOSIS — K219 Gastro-esophageal reflux disease without esophagitis: Secondary | ICD-10-CM | POA: Diagnosis not present

## 2022-08-23 DIAGNOSIS — I480 Paroxysmal atrial fibrillation: Secondary | ICD-10-CM | POA: Diagnosis not present

## 2022-08-23 DIAGNOSIS — G4733 Obstructive sleep apnea (adult) (pediatric): Secondary | ICD-10-CM | POA: Diagnosis not present

## 2022-08-23 DIAGNOSIS — I421 Obstructive hypertrophic cardiomyopathy: Secondary | ICD-10-CM | POA: Diagnosis not present

## 2022-08-23 DIAGNOSIS — Z7901 Long term (current) use of anticoagulants: Secondary | ICD-10-CM | POA: Diagnosis not present

## 2022-08-29 ENCOUNTER — Other Ambulatory Visit
Admission: RE | Admit: 2022-08-29 | Discharge: 2022-08-29 | Disposition: A | Discharge status: Home / Self Care | Payer: 59 | Source: Ambulatory Visit | Attending: Nurse Practitioner | Admitting: Nurse Practitioner

## 2022-08-29 ENCOUNTER — Ambulatory Visit (INDEPENDENT_AMBULATORY_CARE_PROVIDER_SITE_OTHER): Payer: 59

## 2022-08-29 ENCOUNTER — Ambulatory Visit (INDEPENDENT_AMBULATORY_CARE_PROVIDER_SITE_OTHER): Payer: 59 | Admitting: Nurse Practitioner

## 2022-08-29 ENCOUNTER — Encounter (INDEPENDENT_AMBULATORY_CARE_PROVIDER_SITE_OTHER): Payer: Self-pay | Admitting: Nurse Practitioner

## 2022-08-29 VITALS — BP 138/82 | HR 66 | Resp 16 | Wt 180.4 lb

## 2022-08-29 DIAGNOSIS — I739 Peripheral vascular disease, unspecified: Secondary | ICD-10-CM | POA: Insufficient documentation

## 2022-08-29 DIAGNOSIS — I70213 Atherosclerosis of native arteries of extremities with intermittent claudication, bilateral legs: Secondary | ICD-10-CM | POA: Diagnosis not present

## 2022-08-29 DIAGNOSIS — I10 Essential (primary) hypertension: Secondary | ICD-10-CM | POA: Diagnosis not present

## 2022-08-29 DIAGNOSIS — I421 Obstructive hypertrophic cardiomyopathy: Secondary | ICD-10-CM | POA: Insufficient documentation

## 2022-08-29 DIAGNOSIS — I2510 Atherosclerotic heart disease of native coronary artery without angina pectoris: Secondary | ICD-10-CM | POA: Insufficient documentation

## 2022-08-29 DIAGNOSIS — Z9889 Other specified postprocedural states: Secondary | ICD-10-CM

## 2022-08-29 DIAGNOSIS — I639 Cerebral infarction, unspecified: Secondary | ICD-10-CM | POA: Insufficient documentation

## 2022-08-29 DIAGNOSIS — Z720 Tobacco use: Secondary | ICD-10-CM | POA: Diagnosis not present

## 2022-08-29 DIAGNOSIS — I5032 Chronic diastolic (congestive) heart failure: Secondary | ICD-10-CM | POA: Diagnosis not present

## 2022-08-29 DIAGNOSIS — I480 Paroxysmal atrial fibrillation: Secondary | ICD-10-CM | POA: Diagnosis not present

## 2022-08-29 LAB — BASIC METABOLIC PANEL
Anion gap: 12 (ref 5–15)
BUN: 11 mg/dL (ref 8–23)
CO2: 26 mmol/L (ref 22–32)
Calcium: 9 mg/dL (ref 8.9–10.3)
Chloride: 100 mmol/L (ref 98–111)
Creatinine, Ser: 0.73 mg/dL (ref 0.44–1.00)
GFR, Estimated: >60 mL/min (ref >60)
Glucose, Bld: 96 mg/dL (ref 70–99)
Potassium: 2.9 mmol/L — ABNORMAL LOW (ref 3.5–5.1)
Sodium: 138 mmol/L (ref 135–145)

## 2022-08-29 NOTE — Progress Notes (Incomplete)
Subjective:    Patient ID: Sarah Barnes, female    DOB: 01-28-1951, 72 y.o.   MRN: [id number] Chief Complaint  Patient presents with  . Follow-up    Ultrasound follow up    The patient returns to the office for followup and review of the noninvasive studies.    No interval shortening of the patient's claudication distance or development of rest pain symptoms. No new ulcers or wounds have occurred since the last visit.  She does however endorse having multiple falls recently.  She notes that her legs have been giving out.  Due to these falls she has had some injuries.  She also notes that she has issues with her knees and her back as well.  There have been no significant changes to the patient's overall health care.  The patient denies amaurosis fugax or recent TIA symptoms. There are no documented recent neurological changes noted. There is no history of DVT, PE or superficial thrombophlebitis. The patient denies recent episodes of angina or shortness of breath.   ABI Rt=0.98 and Lt=0.99  (previous ABI's Rt=1.02 and Lt=1.06) Duplex ultrasound of the bilateral tibial arteries shows multiphasic waveforms with normal toe waveforms bilaterally.    Review of Systems     Objective:   Physical Exam  BP 138/82 (BP Location: Right Arm)   Pulse 66   Resp 16   Wt 180 lb 6.4 oz (81.8 kg)   BMI 35.23 kg/m   Past Medical History:  Diagnosis Date  . Agoraphobia with panic disorder   . Anemia   . Arthritis    "all over" (08/13/2016)  . Chronic bronchitis   . COPD (chronic obstructive pulmonary disease)   . Cryptogenic stroke    a. 06/2020 Head CT: low density caudate nucleus concerning for infarct; b. 07/2020 s/p MDT Linq; c. 11/2020 Finding of Afib on Linq.  . Depression   . ETOH abuse   . GAD (generalized anxiety disorder)   . GERD (gastroesophageal reflux disease)   . Headache    "daily til I got glasses; now have headache once in awhile" (08/13/2016)  . Hepatitis 1960    "isolated &  hospitalized for 2 weeks; don't know which kind of hepatitis"   . Hernia, hiatal   . High cholesterol   . Hypertension   . Hypertrophic obstructive cardiomyopathy 12/2016   Echo showed increased LV wall thickness with grade 2 mmHg LVOT gradient and SAM along with GRII DD.  Consistent with HOCM.  . Mild aortic stenosis    a. 01/2019 Echo: EF 55-60%. Mild AS; b. 12/2019 Echo: Nl EF. Mild diast dysfxn. Trace AS/AI/TR. Mild MR.  . Nonobstructive CAD (coronary artery disease)    a. 12/2016 Cath: LM nl, LAD min irregs, LCX nl, OM1/2 min irregs, RCA nl, RPDA min irregs. EF 55-65%.  Marland Kitchen PAD (peripheral artery disease)    a. 09/29/2020 s/p PTA/DBA to L prox/mid SFA and stenting of L SFA; b. 10/06/2020 PTA of R TP trunk and prox peroenal. PTA/DBA of R SFA and prox R Popliteal. R SFA stenting x 2; c. 10/2020 ABI: R 0.92, L 0.93.  Marland Kitchen PAF (paroxysmal atrial fibrillation)    a.11/2020 PAF noted on Linq; b. CHA2DS2VASc = 6-->eliquis.  . Pneumonia    "once or twice" (08/13/2016)  . PONV (postoperative nausea and vomiting)   . Sciatica   . Tobacco abuse     Social History   Socioeconomic History  . Marital status: Single    Spouse name:  Not on file  . Number of children: Not on file  . Years of education: Not on file  . Highest education level: Not on file  Occupational History  . Not on file  Tobacco Use  . Smoking status: Every Day    Packs/day: 0.50    Years: 49.00    Additional pack years: 0.00    Total pack years: 24.50    Types: Cigarettes  . Smokeless tobacco: Former  . Tobacco comments:    1/2 pack daily  Vaping Use  . Vaping Use: Former  Substance and Sexual Activity  . Alcohol use: Not Currently    Alcohol/week: 84.0 standard drinks of alcohol    Types: 36 Cans of beer, 48 Shots of liquor per week    Comment: couple of times a week. Had a beer today  . Drug use: Not Currently    Frequency: 1.0 times per week    Types: Marijuana, Cocaine    Comment: Cocaine + March 2017   . Sexual activity: Not Currently  Other Topics Concern  . Not on file  Social History Narrative   Sarah Barnes has generalized anxiety disorder and clearcut Agoraphobia.   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    She currently lives with her sister.   Currently denies substance abuse beyond Tobacco (1-1/2 PPD) & EtOH (beer, vodka) - not ready to discuss quitting.   Social Determinants of Health   Financial Resource Strain: Not on file  Food Insecurity: No Food Insecurity (05/03/2022)   Hunger Vital Sign   . Worried About Programme researcher, broadcasting/film/video in the Last Year: Never true   . Ran Out of Food in the Last Year: Never true  Transportation Needs: Unmet Transportation Needs (05/03/2022)   PRAPARE - Transportation   . Lack of Transportation (Medical): Yes   . Lack of Transportation (Non-Medical): Yes  Physical Activity: Not on file  Stress: Not on file  Social Connections: Not on file  Intimate Partner Violence: Not on file    Past Surgical History:  Procedure Laterality Date  . ANAL FISTULECTOMY  1970s X 3  . BACK SURGERY    . Cardiac MRI     Hypertrophic cardiomyopathy with septal thickness of 19 mm. EF 72%, moderate MR. . Moderate LAD. Partial fusion of right and left cusps of the aortic valve- no stenosis. LVOT turbulence , transient SAM  . COLONOSCOPY WITH PROPOFOL N/A 04/24/2021   Procedure: COLONOSCOPY WITH PROPOFOL;  Surgeon: Wyline Mood, MD;  Location[facility] ENDOSCOPY;  Service: Gastroenterology;  Laterality: N/A;  . COLONOSCOPY WITH PROPOFOL N/A 06/15/2021   Procedure: COLONOSCOPY WITH PROPOFOL;  Surgeon: Wyline Mood, MD;  Location: [facility] ENDOSCOPY;  Service: Gastroenterology;  Laterality: N/A;  . FRACTURE SURGERY    . HERNIA REPAIR    . KNEE ARTHROSCOPY Right   . LAPAROSCOPIC CHOLECYSTECTOMY    . LAPAROSCOPIC INCISIONAL / UMBILICAL / VENTRAL HERNIA REPAIR  08/13/2016   VHR w/mesh   . LOWER EXTREMITY ANGIOGRAPHY Left 09/29/2020   Procedure: LOWER EXTREMITY ANGIOGRAPHY;  Surgeon: Annice Needy, MD;  Location: [facility] INVASIVE CV LAB;  Service: Cardiovascular;  Laterality: Left;  . LOWER EXTREMITY ANGIOGRAPHY Right 10/06/2020   Procedure: LOWER EXTREMITY ANGIOGRAPHY;  Surgeon: Annice Needy, MD;  Location: [facility] INVASIVE CV LAB;  Service: Cardiovascular;  Laterality: Right;  . LUMBAR DISC SURGERY     "herniated  disc"  . NASAL FRACTURE SURGERY  1970s X 2  . RIGHT/LEFT HEART CATH AND CORONARY ANGIOGRAPHY N/A 12/27/2016   Procedure: RIGHT/LEFT HEART CATH AND CORONARY ANGIOGRAPHY;  Surgeon: Marykay LexHarding, David W, MD;  Location: [facility] INVASIVE CV LAB: Mild-moderate pulmonary hypertension.  Hyperdynamic ventricle noted.  EF 55-65% -hyperdynamic (unable to measure LVOT gradient).  Mildly elevated very tortuous but angiographically normal coronary arteries, suggesting hypertensive heart disease  . SHOULDER ARTHROSCOPY WITH ROTATOR CUFF REPAIR Right 1990s?  . TONSILLECTOMY  1951  . TRANSTHORACIC ECHOCARDIOGRAM  01/05/2017    EF 60-65% with dynamic outflow tract obstruction at rest. Peak gradient 52 mmHg consistent with HOCM.  GRII DD area. Aortic sclerosis but no stenosis. Systolic anterior motion of mitral valve chordae. Mild mitral stenosis. Moderate LA dilation and mild RA dilation. Peak PA pressures 35 mmHg.  Marland Kitchen. TRANSTHORACIC ECHOCARDIOGRAM  01/08/2020   [facility]: EF 55 to 60%.  GR 1 DD.  Nodularity calcification.  Trivial AS/AR.  Mild MR  . VENTRAL HERNIA REPAIR N/A 08/13/2016   Procedure: LAPAROSCOPIC VENTRAL HERNIA REPAIR WITH MESH;  Surgeon: Jimmye NormanJames Wyatt, MD;  [facility];  Service: General;  Laterality: N/A;    Family History  Problem Relation Age of Onset  . Diabetes Mother   . Hyperlipidemia Mother   . Hypertension Mother   . Heart attack Mother [id number]  . Angina Mother        chronic problem  . Hypertension Father   . Stroke Father   . Cancer Brother     No Known Allergies      Latest Ref Rng & Units 04/27/2022   10:31 AM 04/08/2022    5:30 PM 03/23/2022    1:57 PM  CBC  WBC 4.0 - 10.5 K/uL 9.0  5.0  11.6   Hemoglobin 12.0 - 15.0 g/dL 91.414.9  78.211.4  95.610.1   Hematocrit 36.0 - 46.0 % 44.7  33.6  29.0   Platelets 150 - 400 K/uL 175  210  130       CMP     Component Value Date/Time   NA 138 08/29/2022 1437   NA 141 12/30/2020 0846   K 2.9 (L) 08/29/2022 1437   CL 100 08/29/2022 1437   CO2 26 08/29/2022 1437   GLUCOSE 96 08/29/2022 1437   BUN 11 08/29/2022 1437   BUN 15 12/30/2020 0846   CREATININE 0.73 08/29/2022 1437   CALCIUM 9.0 08/29/2022 1437   PROT 6.6 06/29/2021 0337   PROT 6.8 12/30/2020 0846   ALBUMIN 3.8 06/29/2021 0337   ALBUMIN 4.6 12/30/2020 0846   AST 22 06/29/2021 0337   ALT 16 06/29/2021 0337   ALKPHOS 50 06/29/2021 0337   BILITOT 0.7 06/29/2021 0337   BILITOT 0.8 12/30/2020 0846   GFRNONAA >60 08/29/2022 1437   GFRAA 35 (L) 11/20/2019 1317     No results found.     Assessment & Plan:   1. Peripheral arterial disease with history of revascularization ***  2. Tobacco abuse ***  3. Essential hypertension ***   Current Outpatient Medications on File Prior to Visit  Medication Sig Dispense Refill  . albuterol (VENTOLIN HFA) 108 (90 Base) MCG/ACT inhaler Inhale 2 puffs into the lungs every 6 (six) hours as needed for wheezing or shortness of breath. 8 g 2  . apixaban (ELIQUIS) 5 MG TABS tablet TAKE 1 TABLET BY MOUTH TWICE  DAILY 200 tablet 1  . atorvastatin (LIPITOR) 40 MG tablet TAKE 1 TABLET BY MOUTH DAILY 100 tablet 2  .  buPROPion (WELLBUTRIN XL) 150 MG 24 hr tablet TAKE 1 TABLET BY MOUTH  DAILY 90 tablet 3  . busPIRone (BUSPAR) 15 MG tablet TAKE 1 TABLET BY MOUTH  TWICE DAILY 180 tablet 1  . carvedilol (COREG) 12.5 MG tablet Take 1.5 tablets (18.75 mg total) by mouth 2 (two) times daily. 300 tablet 2  . celecoxib (CELEBREX) 100 MG capsule TAKE 1 CAPSULE BY MOUTH DAILY 100 capsule 2  . cilostazol (PLETAL) 50 MG tablet TAKE  1 TABLET BY MOUTH TWICE  DAILY 200 tablet 2  . clopidogrel (PLAVIX) 75 MG tablet TAKE 1 TABLET BY MOUTH  DAILY 90 tablet 3  . escitalopram (LEXAPRO) 10 MG tablet TAKE 1 TABLET BY MOUTH DAILY 90 tablet 3  . fenofibrate micronized (LOFIBRA) 134 MG capsule Take 1 capsule (134 mg total) by mouth daily before breakfast. 90 capsule 3  . folic acid (FOLVITE) 1 MG tablet Take 1 tablet (1 mg total) by mouth daily. 30 tablet 0  . furosemide (LASIX) 40 MG tablet TAKE 1 TABLET BY MOUTH DAILY 100 tablet 2  . gabapentin (NEURONTIN) 100 MG capsule TAKE 2 CAPSULES BY MOUTH IN THE  MORNING AND AT NIGHT; AND 1  CAPSULE IN THE AFTERNOON 500 capsule 2  . hydrOXYzine (VISTARIL) 25 MG capsule Take one tab po qd prn for anxiety 90 capsule 1  . Multiple Vitamin (MULTIVITAMIN WITH MINERALS) TABS tablet Take 1 tablet by mouth daily.    . pantoprazole (PROTONIX) 40 MG tablet TAKE 1 TABLET BY MOUTH DAILY 100 tablet 2  . traZODone (DESYREL) 100 MG tablet TAKE 1 TABLET BY MOUTH DAILY AT  BEDTIME 90 tablet 3  . valsartan (DIOVAN) 320 MG tablet Take 1 tablet (320 mg total) by mouth daily. 90 tablet 3   No current facility-administered medications on file prior to visit.    There are no Patient Instructions on file for this visit. No follow-ups on file.   Georgiana Spinner, NP

## 2022-08-29 NOTE — Progress Notes (Signed)
Subjective:    Patient ID: Sarah Barnes, female    DOB: [birth year], 72 y.o.   MRN: [id number] Chief Complaint  Patient presents with   Follow-up    Ultrasound follow up    The patient returns to the office for followup and review of the noninvasive studies.    No interval shortening of the patient's claudication distance or development of rest pain symptoms. No new ulcers or wounds have occurred since the last visit.  She does however endorse having multiple falls recently.  She notes that her legs have been giving out.  Due to these falls she has had some injuries.  She also notes that she has issues with her knees and her back as well.  There have been no significant changes to the patient's overall health care.  The patient denies amaurosis fugax or recent TIA symptoms. There are no documented recent neurological changes noted. There is no history of DVT, PE or superficial thrombophlebitis. The patient denies recent episodes of angina or shortness of breath.   ABI Rt=0.98 and Lt=0.99  (previous ABI's Rt=1.02 and Lt=1.06) Duplex ultrasound of the bilateral tibial arteries shows multiphasic waveforms with normal toe waveforms bilaterally.    Review of Systems  Musculoskeletal:  Positive for arthralgias and gait problem.  Neurological:  Positive for weakness.  All other systems reviewed and are negative.      Objective:   Physical Exam Vitals reviewed.  HENT:     Head: Normocephalic.  Cardiovascular:     Rate and Rhythm: Normal rate.     Pulses: Normal pulses.  Pulmonary:     Effort: Pulmonary effort is normal.  Skin:    General: Skin is warm and dry.  Neurological:     Mental Status: She is alert and oriented to person, place, and time.  Psychiatric:        Mood and Affect: Mood normal.        Behavior: Behavior normal.        Thought Content: Thought content normal.        Judgment: Judgment normal.     BP 138/82 (BP Location: Right Arm)   Pulse 66    Resp 16   Wt 180 lb 6.4 oz (81.8 kg)   BMI 35.23 kg/m   Past Medical History:  Diagnosis Date   Agoraphobia with panic disorder    Anemia    Arthritis    "all over" (08/13/2016)   Chronic bronchitis    COPD (chronic obstructive pulmonary disease)    Cryptogenic stroke    a. 06/2020 Head CT: low density caudate nucleus concerning for infarct; b. 07/2020 s/p MDT Linq; c. 11/2020 Finding of Afib on Linq.   Depression    ETOH abuse    GAD (generalized anxiety disorder)    GERD (gastroesophageal reflux disease)    Headache    "daily til I got glasses; now have headache once in awhile" (08/13/2016)   Hepatitis 1960   "isolated &  hospitalized for 2 weeks; don't know which kind of hepatitis"    Hernia, hiatal    High cholesterol    Hypertension    Hypertrophic obstructive cardiomyopathy 12/2016   Echo showed increased LV wall thickness with grade 2 mmHg LVOT gradient and SAM along with GRII DD.  Consistent with HOCM.   Mild aortic stenosis    a. 01/2019 Echo: EF 55-60%. Mild AS; b. 12/2019 Echo: Nl EF. Mild diast dysfxn. Trace AS/AI/TR. Mild MR.   Nonobstructive CAD (coronary  artery disease)    a. 12/2016 Cath: LM nl, LAD min irregs, LCX nl, OM1/2 min irregs, RCA nl, RPDA min irregs. EF 55-65%.   PAD (peripheral artery disease)    a. 09/29/2020 s/p PTA/DBA to L prox/mid SFA and stenting of L SFA; b. 10/06/2020 PTA of R TP trunk and prox peroenal. PTA/DBA of R SFA and prox R Popliteal. R SFA stenting x 2; c. 10/2020 ABI: R 0.92, L 0.93.   PAF (paroxysmal atrial fibrillation)    a.11/2020 PAF noted on Linq; b. CHA2DS2VASc = 6-->eliquis.   Pneumonia    "once or twice" (08/13/2016)   PONV (postoperative nausea and vomiting)    Sciatica    Tobacco abuse     Social History   Socioeconomic History   Marital status: Single    Spouse name: Not on file   Number of children: Not on file   Years of education: Not on file   Highest education level: Not on file  Occupational History   Not on file   Tobacco Use   Smoking status: Every Day    Packs/day: 0.50    Years: 49.00    Additional pack years: 0.00    Total pack years: 24.50    Types: Cigarettes   Smokeless tobacco: Former   Tobacco comments:    1/2 pack daily  Vaping Use   Vaping Use: Former  Substance and Sexual Activity   Alcohol use: Not Currently    Alcohol/week: 84.0 standard drinks of alcohol    Types: 36 Cans of beer, 48 Shots of liquor per week    Comment: couple of times a week. Had a beer today   Drug use: Not Currently    Frequency: 1.0 times per week    Types: Marijuana, Cocaine    Comment: Cocaine + March 2017   Sexual activity: Not Currently  Other Topics Concern   Not on file  Social History Narrative   Julisia has generalized anxiety disorder and clearcut Agoraphobia.   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    She currently lives with her sister.   Currently denies substance abuse beyond Tobacco (1-1/2 PPD) & EtOH (beer, vodka) - not ready to discuss quitting.   Social Determinants of Health   Financial Resource Strain: Not on file  Food Insecurity: No Food Insecurity (05/03/2022)   Hunger Vital Sign    Worried About Running Out of Food in the Last Year: Never true    Ran Out of Food in the Last Year: Never true  Transportation Needs: Unmet Transportation Needs (05/03/2022)   PRAPARE - Administrator, Civil Service (Medical): Yes    Lack of Transportation (Non-Medical): Yes  Physical Activity: Not on file  Stress: Not on file  Social Connections: Not on file  Intimate Partner Violence: Not on file    Past Surgical History:  Procedure Laterality Date   ANAL FISTULECTOMY  1970s X 3   BACK SURGERY     Cardiac MRI     Hypertrophic cardiomyopathy with septal thickness of 19 mm. EF 72%, moderate MR. . Moderate LAD. Partial fusion of right and left cusps of the aortic valve- no  stenosis. LVOT turbulence , transient SAM   COLONOSCOPY WITH PROPOFOL N/A 04/24/2021   Procedure: COLONOSCOPY WITH PROPOFOL;  Surgeon: Wyline Mood, MD;  Location: [facility] ENDOSCOPY;  Service: Gastroenterology;  Laterality:  N/A;   COLONOSCOPY WITH PROPOFOL N/A 06/15/2021   Procedure: COLONOSCOPY WITH PROPOFOL;  Surgeon: Wyline Mood, MD;  Location: [facility] ENDOSCOPY;  Service: Gastroenterology;  Laterality: N/A;   FRACTURE SURGERY     HERNIA REPAIR     KNEE ARTHROSCOPY Right    LAPAROSCOPIC CHOLECYSTECTOMY     LAPAROSCOPIC INCISIONAL / UMBILICAL / VENTRAL HERNIA REPAIR  08/13/2016   VHR w/mesh   LOWER EXTREMITY ANGIOGRAPHY Left 09/29/2020   Procedure: LOWER EXTREMITY ANGIOGRAPHY;  Surgeon: Annice Needy, MD;  Location: [facility] INVASIVE CV LAB;  Service: Cardiovascular;  Laterality: Left;   LOWER EXTREMITY ANGIOGRAPHY Right 10/06/2020   Procedure: LOWER EXTREMITY ANGIOGRAPHY;  Surgeon: Annice Needy, MD;  Location: [facility] INVASIVE CV LAB;  Service: Cardiovascular;  Laterality: Right;   LUMBAR DISC SURGERY     "herniated disc"   NASAL FRACTURE SURGERY  1970s X 2   RIGHT/LEFT HEART CATH AND CORONARY ANGIOGRAPHY N/A 12/27/2016   Procedure: RIGHT/LEFT HEART CATH AND CORONARY ANGIOGRAPHY;  Surgeon: Marykay Lex, MD;  Location: [facility] INVASIVE CV LAB: Mild-moderate pulmonary hypertension.  Hyperdynamic ventricle noted.  EF 55-65% -hyperdynamic (unable to measure LVOT gradient).  Mildly elevated very tortuous but angiographically normal coronary arteries, suggesting hypertensive heart disease   SHOULDER ARTHROSCOPY WITH ROTATOR CUFF REPAIR Right 1990s?   TONSILLECTOMY  1951   TRANSTHORACIC ECHOCARDIOGRAM  01/05/2017    EF 60-65% with dynamic outflow tract obstruction at rest. Peak gradient 52 mmHg consistent with HOCM.  GRII DD area. Aortic sclerosis but no stenosis. Systolic anterior motion of mitral valve chordae. Mild mitral stenosis. Moderate LA dilation and mild RA dilation. Peak PA pressures 35 mmHg.    TRANSTHORACIC ECHOCARDIOGRAM  01/08/2020   NOVA: EF 55 to 60%.  GR 1 DD.  Nodularity calcification.  Trivial AS/AR.  Mild MR   VENTRAL HERNIA REPAIR N/A 08/13/2016   Procedure: LAPAROSCOPIC VENTRAL HERNIA REPAIR WITH MESH;  Surgeon: Jimmye Norman, MD;  Location: [facility] OR;  Service: General;  Laterality: N/A;    Family History  Problem Relation Age of Onset   Diabetes Mother    Hyperlipidemia Mother    Hypertension Mother    Heart attack Mother 67   Angina Mother        chronic problem   Hypertension Father    Stroke Father    Cancer Brother     No Known Allergies     Latest Ref Rng & Units 04/27/2022   10:31 AM 04/08/2022    5:30 PM 03/23/2022    1:57 PM  CBC  WBC 4.0 - 10.5 K/uL 9.0  5.0  11.6   Hemoglobin 12.0 - 15.0 g/dL 70.9  64.3  83.8   Hematocrit 36.0 - 46.0 % 44.7  33.6  29.0   Platelets 150 - 400 K/uL 175  210  130       CMP     Component Value Date/Time   NA 138 08/29/2022 1437   NA 141 12/30/2020 0846   K 2.9 (L) 08/29/2022 1437   CL 100 08/29/2022 1437   CO2 26 08/29/2022 1437   GLUCOSE 96 08/29/2022 1437   BUN 11 08/29/2022 1437   BUN 15 12/30/2020 0846   CREATININE 0.73 08/29/2022 1437   CALCIUM 9.0 08/29/2022 1437   PROT 6.6 06/29/2021 0337   PROT 6.8 12/30/2020 0846   ALBUMIN 3.8 06/29/2021 0337   ALBUMIN 4.6 12/30/2020 0846   AST 22 06/29/2021 0337   ALT 16 06/29/2021 0337   ALKPHOS 50 06/29/2021 1840  BILITOT 0.7 06/29/2021 0337   BILITOT 0.8 12/30/2020 0846   GFRNONAA >60 08/29/2022 1437   GFRAA 35 (L) 11/20/2019 1317     No results found.     Assessment & Plan:   1. Peripheral arterial disease with history of revascularization The patient has had a worsening of her walking.  Following discussion it is possible this could be related to some iliac level disease.  Will have the patient return at her convenience for evaluation.  I discussed with the patient that given her lower back issues as well as her knee issues this can certainly be  contributing to her issues with falls as well.  Once we have ruled out any possible vascular causes we can direct the patient to her primary care provider for further workup and evaluation.  2. Tobacco abuse Continued tobacco use can worsen atherosclerotic disease.  Cessation is advised  3. Essential hypertension Continue antihypertensive medications as already ordered, these medications have been reviewed and there are no changes at this time.   Current Outpatient Medications on File Prior to Visit  Medication Sig Dispense Refill   albuterol (VENTOLIN HFA) 108 (90 Base) MCG/ACT inhaler Inhale 2 puffs into the lungs every 6 (six) hours as needed for wheezing or shortness of breath. 8 g 2   apixaban (ELIQUIS) 5 MG TABS tablet TAKE 1 TABLET BY MOUTH TWICE  DAILY 200 tablet 1   atorvastatin (LIPITOR) 40 MG tablet TAKE 1 TABLET BY MOUTH DAILY 100 tablet 2   buPROPion (WELLBUTRIN XL) 150 MG 24 hr tablet TAKE 1 TABLET BY MOUTH  DAILY 90 tablet 3   busPIRone (BUSPAR) 15 MG tablet TAKE 1 TABLET BY MOUTH  TWICE DAILY 180 tablet 1   carvedilol (COREG) 12.5 MG tablet Take 1.5 tablets (18.75 mg total) by mouth 2 (two) times daily. 300 tablet 2   celecoxib (CELEBREX) 100 MG capsule TAKE 1 CAPSULE BY MOUTH DAILY 100 capsule 2   cilostazol (PLETAL) 50 MG tablet TAKE 1 TABLET BY MOUTH TWICE  DAILY 200 tablet 2   clopidogrel (PLAVIX) 75 MG tablet TAKE 1 TABLET BY MOUTH  DAILY 90 tablet 3   escitalopram (LEXAPRO) 10 MG tablet TAKE 1 TABLET BY MOUTH DAILY 90 tablet 3   fenofibrate micronized (LOFIBRA) 134 MG capsule Take 1 capsule (134 mg total) by mouth daily before breakfast. 90 capsule 3   folic acid (FOLVITE) 1 MG tablet Take 1 tablet (1 mg total) by mouth daily. 30 tablet 0   furosemide (LASIX) 40 MG tablet TAKE 1 TABLET BY MOUTH DAILY 100 tablet 2   gabapentin (NEURONTIN) 100 MG capsule TAKE 2 CAPSULES BY MOUTH IN THE  MORNING AND AT NIGHT; AND 1  CAPSULE IN THE AFTERNOON 500 capsule 2   hydrOXYzine  (VISTARIL) 25 MG capsule Take one tab po qd prn for anxiety 90 capsule 1   Multiple Vitamin (MULTIVITAMIN WITH MINERALS) TABS tablet Take 1 tablet by mouth daily.     pantoprazole (PROTONIX) 40 MG tablet TAKE 1 TABLET BY MOUTH DAILY 100 tablet 2   traZODone (DESYREL) 100 MG tablet TAKE 1 TABLET BY MOUTH DAILY AT  BEDTIME 90 tablet 3   valsartan (DIOVAN) 320 MG tablet Take 1 tablet (320 mg total) by mouth daily. 90 tablet 3   No current facility-administered medications on file prior to visit.    There are no Patient Instructions on file for this visit. No follow-ups on file.   Georgiana SpinnerFallon E Altariq Goodall, NP

## 2022-08-30 ENCOUNTER — Other Ambulatory Visit (INDEPENDENT_AMBULATORY_CARE_PROVIDER_SITE_OTHER): Payer: Self-pay | Admitting: Nurse Practitioner

## 2022-08-30 ENCOUNTER — Other Ambulatory Visit: Payer: Self-pay

## 2022-08-30 ENCOUNTER — Telehealth: Payer: Self-pay | Admitting: Cardiology

## 2022-08-30 ENCOUNTER — Other Ambulatory Visit: Payer: Self-pay | Admitting: Physician Assistant

## 2022-08-30 DIAGNOSIS — G4733 Obstructive sleep apnea (adult) (pediatric): Secondary | ICD-10-CM | POA: Diagnosis not present

## 2022-08-30 DIAGNOSIS — J4489 Other specified chronic obstructive pulmonary disease: Secondary | ICD-10-CM | POA: Diagnosis not present

## 2022-08-30 DIAGNOSIS — Z79899 Other long term (current) drug therapy: Secondary | ICD-10-CM

## 2022-08-30 DIAGNOSIS — I70219 Atherosclerosis of native arteries of extremities with intermittent claudication, unspecified extremity: Secondary | ICD-10-CM | POA: Diagnosis not present

## 2022-08-30 DIAGNOSIS — S7011XD Contusion of right thigh, subsequent encounter: Secondary | ICD-10-CM | POA: Diagnosis not present

## 2022-08-30 DIAGNOSIS — F32A Depression, unspecified: Secondary | ICD-10-CM | POA: Diagnosis not present

## 2022-08-30 DIAGNOSIS — M5430 Sciatica, unspecified side: Secondary | ICD-10-CM | POA: Diagnosis not present

## 2022-08-30 DIAGNOSIS — I5032 Chronic diastolic (congestive) heart failure: Secondary | ICD-10-CM | POA: Diagnosis not present

## 2022-08-30 DIAGNOSIS — N1832 Chronic kidney disease, stage 3b: Secondary | ICD-10-CM | POA: Diagnosis not present

## 2022-08-30 DIAGNOSIS — Z7902 Long term (current) use of antithrombotics/antiplatelets: Secondary | ICD-10-CM | POA: Diagnosis not present

## 2022-08-30 DIAGNOSIS — I2510 Atherosclerotic heart disease of native coronary artery without angina pectoris: Secondary | ICD-10-CM | POA: Diagnosis not present

## 2022-08-30 DIAGNOSIS — I350 Nonrheumatic aortic (valve) stenosis: Secondary | ICD-10-CM | POA: Diagnosis not present

## 2022-08-30 DIAGNOSIS — Z8673 Personal history of transient ischemic attack (TIA), and cerebral infarction without residual deficits: Secondary | ICD-10-CM | POA: Diagnosis not present

## 2022-08-30 DIAGNOSIS — I739 Peripheral vascular disease, unspecified: Secondary | ICD-10-CM

## 2022-08-30 DIAGNOSIS — E559 Vitamin D deficiency, unspecified: Secondary | ICD-10-CM | POA: Diagnosis not present

## 2022-08-30 DIAGNOSIS — F17210 Nicotine dependence, cigarettes, uncomplicated: Secondary | ICD-10-CM | POA: Diagnosis not present

## 2022-08-30 DIAGNOSIS — F17218 Nicotine dependence, cigarettes, with other nicotine-induced disorders: Secondary | ICD-10-CM

## 2022-08-30 DIAGNOSIS — I130 Hypertensive heart and chronic kidney disease with heart failure and stage 1 through stage 4 chronic kidney disease, or unspecified chronic kidney disease: Secondary | ICD-10-CM | POA: Diagnosis not present

## 2022-08-30 DIAGNOSIS — I480 Paroxysmal atrial fibrillation: Secondary | ICD-10-CM | POA: Diagnosis not present

## 2022-08-30 DIAGNOSIS — E7800 Pure hypercholesterolemia, unspecified: Secondary | ICD-10-CM | POA: Diagnosis not present

## 2022-08-30 DIAGNOSIS — I421 Obstructive hypertrophic cardiomyopathy: Secondary | ICD-10-CM | POA: Diagnosis not present

## 2022-08-30 DIAGNOSIS — Z791 Long term (current) use of non-steroidal anti-inflammatories (NSAID): Secondary | ICD-10-CM | POA: Diagnosis not present

## 2022-08-30 DIAGNOSIS — D631 Anemia in chronic kidney disease: Secondary | ICD-10-CM | POA: Diagnosis not present

## 2022-08-30 DIAGNOSIS — Z7901 Long term (current) use of anticoagulants: Secondary | ICD-10-CM | POA: Diagnosis not present

## 2022-08-30 DIAGNOSIS — Z9981 Dependence on supplemental oxygen: Secondary | ICD-10-CM | POA: Diagnosis not present

## 2022-08-30 DIAGNOSIS — K219 Gastro-esophageal reflux disease without esophagitis: Secondary | ICD-10-CM | POA: Diagnosis not present

## 2022-08-30 MED ORDER — POTASSIUM CHLORIDE CRYS ER 20 MEQ PO TBCR: 40.0000 meq | EXTENDED_RELEASE_TABLET | Freq: Every day | ORAL | 0 refills | Status: DC

## 2022-08-30 MED ORDER — POTASSIUM CHLORIDE CRYS ER 20 MEQ PO TBCR: 40.0000 meq | EXTENDED_RELEASE_TABLET | Freq: Every day | ORAL | 3 refills | Status: DC

## 2022-08-30 NOTE — Telephone Encounter (Signed)
Patient stated a 30 day supply of the medication was to be sent to [id number] - [place] ([place]), [place] - [address] in addition to the prescription sent to [facility] as she need to start this medication as soon as possible.  Rx sent to pharmacy of choice for 30 day supply

## 2022-08-30 NOTE — Telephone Encounter (Signed)
Pt c/o medication issue:  1. Name of Medication:   potassium chloride SA (KLOR-CON M20) 20 MEQ tablet   2. How are you currently taking this medication (dosage and times per day)?   Not taking yet  3. Are you having a reaction (difficulty breathing--STAT)?   4. What is your medication issue?   Patient stated a 30 day supply of the medication was to be sent to [id number] - [place] ([place]), [place] - [address] in addition to the prescription sent to [facility] as she need to start this medication as soon as possible.

## 2022-09-03 LAB — VAS US ABI WITH/WO TBI
Left ABI: 0.99
Right ABI: 0.98

## 2022-09-05 ENCOUNTER — Encounter (INDEPENDENT_AMBULATORY_CARE_PROVIDER_SITE_OTHER): Payer: Self-pay | Admitting: Nurse Practitioner

## 2022-09-05 ENCOUNTER — Ambulatory Visit (INDEPENDENT_AMBULATORY_CARE_PROVIDER_SITE_OTHER): Payer: 59

## 2022-09-05 ENCOUNTER — Ambulatory Visit (INDEPENDENT_AMBULATORY_CARE_PROVIDER_SITE_OTHER): Payer: 59 | Admitting: Nurse Practitioner

## 2022-09-05 VITALS — BP 147/91 | HR 69 | Resp 16 | Ht 60.0 in | Wt 181.0 lb

## 2022-09-05 DIAGNOSIS — I10 Essential (primary) hypertension: Secondary | ICD-10-CM

## 2022-09-05 DIAGNOSIS — I739 Peripheral vascular disease, unspecified: Secondary | ICD-10-CM | POA: Diagnosis not present

## 2022-09-05 DIAGNOSIS — Z720 Tobacco use: Secondary | ICD-10-CM

## 2022-09-05 DIAGNOSIS — Z9889 Other specified postprocedural states: Secondary | ICD-10-CM

## 2022-09-05 DIAGNOSIS — R531 Weakness: Secondary | ICD-10-CM | POA: Diagnosis not present

## 2022-09-05 DIAGNOSIS — E785 Hyperlipidemia, unspecified: Secondary | ICD-10-CM | POA: Diagnosis not present

## 2022-09-05 NOTE — Progress Notes (Signed)
Subjective:    Patient ID: Sarah Barnes, female    DOB: August 09, 1950, 72 y.o.   MRN: [id number] Chief Complaint  Patient presents with   Follow-up    ultrasound    The patient returns to the office for followup and review of the noninvasive studies.  She previously underwent ABIs which were stable with a right ABI 0.98 in the left and 0.99.  However the patient noted that she was having multiple falls recently.  She notes that her legs have been giving out and due to these fall she has had injuries.  She notes that she has back issues with surgery several years ago.  She also notes that she has issues with her knees as well.  Today we had the patient return to evaluate for any possible iliac level disease that may be contributing to her repeated falls.  Today she has no evidence of any significant iliac level disease.  No areas of focal stenosis.  Abdominal aorta was measured with no evidence of an abdominal aortic aneurysm.    No interval shortening of the patient's claudication distance or development of rest pain symptoms. No new ulcers or wounds have occurred since the last visit.  She does however endorse having multiple falls recently.  She notes that her legs have been giving out.  Due to these falls she has had some injuries.  She also notes that she has issues with her knees and her back as well.   There have been no significant changes to the patient's overall health care.   The patient denies amaurosis fugax or recent TIA symptoms. There are no documented recent neurological changes noted. There is no history of DVT, PE or superficial thrombophlebitis. The patient denies recent episodes of angina or shortness of breath.    ABI Rt=0.98 and Lt=0.99  (previous ABI's Rt=1.02 and Lt=1.06) Duplex ultrasound of the bilateral tibial arteries shows multiphasic waveforms with normal toe waveforms bilaterally.     Review of Systems  Musculoskeletal:  Positive for arthralgias.   Neurological:  Positive for weakness.  All other systems reviewed and are negative.      Objective:   Physical Exam Vitals reviewed.  HENT:     Head: Normocephalic.  Cardiovascular:     Rate and Rhythm: Normal rate.     Pulses:          Dorsalis pedis pulses are detected w/ Doppler on the right side and detected w/ Doppler on the left side.       Posterior tibial pulses are detected w/ Doppler on the right side and detected w/ Doppler on the left side.  Pulmonary:     Effort: Pulmonary effort is normal.  Skin:    General: Skin is warm and dry.  Neurological:     Mental Status: She is alert and oriented to person, place, and time.     Gait: Gait abnormal.  Psychiatric:        Mood and Affect: Mood normal.        Behavior: Behavior normal.        Thought Content: Thought content normal.        Judgment: Judgment normal.     BP (!) 147/91 (BP Location: Right Arm)   Pulse 69   Resp 16   Ht 5' (1.524 m)   Wt 181 lb (82.1 kg)   BMI 35.35 kg/m   Past Medical History:  Diagnosis Date   Agoraphobia with panic disorder  Anemia    Arthritis    "all over" (08/13/2016)   Chronic bronchitis    COPD (chronic obstructive pulmonary disease)    Cryptogenic stroke    a. 06/2020 Head CT: low density caudate nucleus concerning for infarct; b. 07/2020 s/p MDT Linq; c. 11/2020 Finding of Afib on Linq.   Depression    ETOH abuse    GAD (generalized anxiety disorder)    GERD (gastroesophageal reflux disease)    Headache    "daily til I got glasses; now have headache once in awhile" (08/13/2016)   Hepatitis 1960   "isolated &  hospitalized for 2 weeks; don't know which kind of hepatitis"    Hernia, hiatal    High cholesterol    Hypertension    Hypertrophic obstructive cardiomyopathy 12/2016   Echo showed increased LV wall thickness with grade 2 mmHg LVOT gradient and SAM along with GRII DD.  Consistent with HOCM.   Mild aortic stenosis    a. 01/2019 Echo: EF 55-60%. Mild AS; b.  12/2019 Echo: Nl EF. Mild diast dysfxn. Trace AS/AI/TR. Mild MR.   Nonobstructive CAD (coronary artery disease)    a. 12/2016 Cath: LM nl, LAD min irregs, LCX nl, OM1/2 min irregs, RCA nl, RPDA min irregs. EF 55-65%.   PAD (peripheral artery disease)    a. 09/29/2020 s/p PTA/DBA to L prox/mid SFA and stenting of L SFA; b. 10/06/2020 PTA of R TP trunk and prox peroenal. PTA/DBA of R SFA and prox R Popliteal. R SFA stenting x 2; c. 10/2020 ABI: R 0.92, L 0.93.   PAF (paroxysmal atrial fibrillation)    a.11/2020 PAF noted on Linq; b. CHA2DS2VASc = 6-->eliquis.   Pneumonia    "once or twice" (08/13/2016)   PONV (postoperative nausea and vomiting)    Sciatica    Tobacco abuse     Social History   Socioeconomic History   Marital status: Single    Spouse name: Not on file   Number of children: Not on file   Years of education: Not on file   Highest education level: Not on file  Occupational History   Not on file  Tobacco Use   Smoking status: Every Day    Packs/day: 0.50    Years: 49.00    Additional pack years: 0.00    Total pack years: 24.50    Types: Cigarettes   Smokeless tobacco: Former   Tobacco comments:    1/2 pack daily  Vaping Use   Vaping Use: Former  Substance and Sexual Activity   Alcohol use: Not Currently    Alcohol/week: 84.0 standard drinks of alcohol    Types: 36 Cans of beer, 48 Shots of liquor per week    Comment: couple of times a week. Had a beer today   Drug use: Not Currently    Frequency: 1.0 times per week    Types: Marijuana, Cocaine    Comment: Cocaine + March 2017   Sexual activity: Not Currently  Other Topics Concern   Not on file  Social History Narrative   Anquanette has generalized anxiety disorder and clearcut Agoraphobia.   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    She currently lives with her sister.   Currently denies substance abuse beyond Tobacco  (1-1/2 PPD) & EtOH (beer, vodka) - not ready to discuss quitting.   Social Determinants of Health  Financial Resource Strain: Not on file  Food Insecurity: No Food Insecurity (05/03/2022)   Hunger Vital Sign    Worried About Running Out of Food in the Last Year: Never true    Ran Out of Food in the Last Year: Never true  Transportation Needs: Unmet Transportation Needs (05/03/2022)   PRAPARE - Administrator, Civil Service (Medical): Yes    Lack of Transportation (Non-Medical): Yes  Physical Activity: Not on file  Stress: Not on file  Social Connections: Not on file  Intimate Partner Violence: Not on file    Past Surgical History:  Procedure Laterality Date   ANAL FISTULECTOMY  1970s X 3   BACK SURGERY     Cardiac MRI     Hypertrophic cardiomyopathy with septal thickness of 19 mm. EF 72%, moderate MR. . Moderate LAD. Partial fusion of right and left cusps of the aortic valve- no stenosis. LVOT turbulence , transient SAM   COLONOSCOPY WITH PROPOFOL N/A 04/24/2021   Procedure: COLONOSCOPY WITH PROPOFOL;  Surgeon: Wyline Mood, MD;  Location: [facility] ENDOSCOPY;  Service: Gastroenterology;  Laterality: N/A;   COLONOSCOPY WITH PROPOFOL N/A 06/15/2021   Procedure: COLONOSCOPY WITH PROPOFOL;  Surgeon: Wyline Mood, MD;  Location: [facility] ENDOSCOPY;  Service: Gastroenterology;  Laterality: N/A;   FRACTURE SURGERY     HERNIA REPAIR     KNEE ARTHROSCOPY Right    LAPAROSCOPIC CHOLECYSTECTOMY     LAPAROSCOPIC INCISIONAL / UMBILICAL / VENTRAL HERNIA REPAIR  08/13/2016   VHR w/mesh   LOWER EXTREMITY ANGIOGRAPHY Left 09/29/2020   Procedure: LOWER EXTREMITY ANGIOGRAPHY;  Surgeon: Annice Needy, MD;  Location: [facility] INVASIVE CV LAB;  Service: Cardiovascular;  Laterality: Left;   LOWER EXTREMITY ANGIOGRAPHY Right 10/06/2020   Procedure: LOWER EXTREMITY ANGIOGRAPHY;  Surgeon: Annice Needy, MD;  Location: [facility] INVASIVE CV LAB;  Service: Cardiovascular;  Laterality: Right;   LUMBAR DISC SURGERY      "herniated disc"   NASAL FRACTURE SURGERY  1970s X 2   RIGHT/LEFT HEART CATH AND CORONARY ANGIOGRAPHY N/A 12/27/2016   Procedure: RIGHT/LEFT HEART CATH AND CORONARY ANGIOGRAPHY;  Surgeon: Marykay Lex, MD;  Location: [facility] INVASIVE CV LAB: Mild-moderate pulmonary hypertension.  Hyperdynamic ventricle noted.  EF 55-65% -hyperdynamic (unable to measure LVOT gradient).  Mildly elevated very tortuous but angiographically normal coronary arteries, suggesting hypertensive heart disease   SHOULDER ARTHROSCOPY WITH ROTATOR CUFF REPAIR Right 1990s?   TONSILLECTOMY  1951   TRANSTHORACIC ECHOCARDIOGRAM  01/05/2017    EF 60-65% with dynamic outflow tract obstruction at rest. Peak gradient 52 mmHg consistent with HOCM.  GRII DD area. Aortic sclerosis but no stenosis. Systolic anterior motion of mitral valve chordae. Mild mitral stenosis. Moderate LA dilation and mild RA dilation. Peak PA pressures 35 mmHg.   TRANSTHORACIC ECHOCARDIOGRAM  01/08/2020   NOVA: EF 55 to 60%.  GR 1 DD.  Nodularity calcification.  Trivial AS/AR.  Mild MR   VENTRAL HERNIA REPAIR N/A 08/13/2016   Procedure: LAPAROSCOPIC VENTRAL HERNIA REPAIR WITH MESH;  Surgeon: Jimmye Norman, MD;  Location: [facility] OR;  Service: General;  Laterality: N/A;    Family History  Problem Relation Age of Onset   Diabetes Mother    Hyperlipidemia Mother    Hypertension Mother    Heart attack Mother 20   Angina Mother        chronic problem   Hypertension Father    Stroke Father    Cancer Brother     No Known Allergies  Latest Ref Rng & Units 04/27/2022   10:31 AM 04/08/2022    5:30 PM 03/23/2022    1:57 PM  CBC  WBC 4.0 - 10.5 K/uL 9.0  5.0  11.6   Hemoglobin 12.0 - 15.0 g/dL 16.1  09.6  04.5   Hematocrit 36.0 - 46.0 % 44.7  33.6  29.0   Platelets 150 - 400 K/uL 175  210  130       CMP     Component Value Date/Time   NA 138 08/29/2022 1437   NA 141 12/30/2020 0846   K 2.9 (L) 08/29/2022 1437   CL 100 08/29/2022 1437   CO2 26  08/29/2022 1437   GLUCOSE 96 08/29/2022 1437   BUN 11 08/29/2022 1437   BUN 15 12/30/2020 0846   CREATININE 0.73 08/29/2022 1437   CALCIUM 9.0 08/29/2022 1437   PROT 6.6 06/29/2021 0337   PROT 6.8 12/30/2020 0846   ALBUMIN 3.8 06/29/2021 0337   ALBUMIN 4.6 12/30/2020 0846   AST 22 06/29/2021 0337   ALT 16 06/29/2021 0337   ALKPHOS 50 06/29/2021 0337   BILITOT 0.7 06/29/2021 0337   BILITOT 0.8 12/30/2020 0846   GFRNONAA >60 08/29/2022 1437   GFRAA 35 (L) 11/20/2019 1317     VAS [place] ABI WITH/WO TBI  Result Date: 09/03/2022  LOWER EXTREMITY DOPPLER STUDY Patient Name:  LORINA DUFFNER  Date of Exam:   08/29/2022 Medical Rec #: [id number]          Accession #:    [id number] Date of Birth: [birth year]           Patient Gender: F Patient Age:   30 years Exam Location:  [facility] Procedure:      VAS [place] ABI WITH/WO TBI Referring Phys: Sheppard Plumber --------------------------------------------------------------------------------  Indications: Peripheral artery disease, and S/P Bilateral Angioplasty with              stents. High Risk         Hypertension, hyperlipidemia, coronary artery disease, prior Factors:          CVA.  Vascular Interventions: 09/29/2020: Aortogram and Selective Left Lower Extremity                         Angiogram. PTA of the Left Proximal to Mid SFA with 5mm                         diameter by 22cm length Lutonix drug -coated angioplasty                         balloon. Stent placement to the Left SFA with 6mm                         diameter by 10 cm length Viabhan stent.                          10/06/2020: Selective Right Lower Extremity Angiogram.                         PTA of the Right Tibioperoneal Trunk and Proximal                         Peroneal Artery with 3mm diameter by 10cm length  angioplasty balloon. PTA of the Right SFA and Proximal                         Popliteal Artery with 5mm diameter by 30cm length                          Lutonix drug coated angioplasty balloon. Viabahn stent                         placement x2 to the Right SFA and Proximal Popliteal                         Artery with a 6mm diameter by 25 cm length and a 6mm                         diameter by 10 cm length stent. Performing Technologist: Hardie Lora RVT  Examination Guidelines: A complete evaluation includes at minimum, Doppler waveform signals and systolic blood pressure reading at the level of bilateral brachial, anterior tibial, and posterior tibial arteries, when vessel segments are accessible. Bilateral testing is considered an integral part of a complete examination. Photoelectric Plethysmograph (PPG) waveforms and toe systolic pressure readings are included as required and additional duplex testing as needed. Limited examinations for reoccurring indications may be performed as noted.  ABI Findings: +---------+------------------+-----+---------+--------+ Right    Rt Pressure (mmHg)IndexWaveform Comment  +---------+------------------+-----+---------+--------+ Brachial 164                                      +---------+------------------+-----+---------+--------+ PTA      160               0.98 triphasic         +---------+------------------+-----+---------+--------+ DP       156               0.95 triphasic         +---------+------------------+-----+---------+--------+ Great Toe131               0.80                   +---------+------------------+-----+---------+--------+ +---------+------------------+-----+---------+-------+ Left     Lt Pressure (mmHg)IndexWaveform Comment +---------+------------------+-----+---------+-------+ Brachial 159                                     +---------+------------------+-----+---------+-------+ PTA      162               0.99 biphasic         +---------+------------------+-----+---------+-------+ DP       162               0.99 triphasic         +---------+------------------+-----+---------+-------+ Great Toe133               0.81                  +---------+------------------+-----+---------+-------+ +-------+-----------+-----------+------------+------------+ ABI/TBIToday's ABIToday's TBIPrevious ABIPrevious TBI +-------+-----------+-----------+------------+------------+ Right  0.98       0.80       1.02        1.03         +-------+-----------+-----------+------------+------------+ Left  0.99       0.81       1.06        0.76         +-------+-----------+-----------+------------+------------+ Bilateral ABIs appear essentially unchanged compared to prior study on 08/04/2021.  Summary: Right: Resting right ankle-brachial index is within normal range. The right toe-brachial index is normal. Left: Resting left ankle-brachial index is within normal range. The left toe-brachial index is normal. *See table(s) above for measurements and observations.  Electronically signed by Levora Dredge MD on 09/03/2022 at 8:39:38 AM.    Final        Assessment & Plan:   1. Peripheral arterial disease with history of revascularization The patient is going to be relocating to [place] which is several hours away.  Based on this we will have the patient follow-up with [place] on an as-needed basis.  Her previous noninvasive study showed stable peripheral arterial disease.  Her studies today show no significant level of iliac level disease.  Will place a referral to a vascular surgeon in [place] to continue to follow the patient. - Ambulatory referral to Vascular Surgery  2. Essential hypertension Continue antihypertensive medications as already ordered, these medications have been reviewed and there are no changes at this time.  3. Tobacco abuse Smoking cessation was discussed, 3-10 minutes spent on this topic specifically  4. Hyperlipidemia, unspecified hyperlipidemia type Continue statin as ordered and reviewed, no changes at this  time  5. Weakness Today noninvasive studies show no evidence of iliac level disease.  I suspect that her falls may be related to either her lower back or possibly other musculoskeletal issues.  Given the fact that the patient will be relocating we will not place any further referrals other than to vascular surgery.  We have discussed with the patient and she will follow-up with her new PCP for further workup and evaluation.  Current Outpatient Medications on File Prior to Visit  Medication Sig Dispense Refill   albuterol (VENTOLIN HFA) 108 (90 Base) MCG/ACT inhaler Inhale 2 puffs into the lungs every 6 (six) hours as needed for wheezing or shortness of breath. 8 g 2   apixaban (ELIQUIS) 5 MG TABS tablet TAKE 1 TABLET BY MOUTH TWICE  DAILY 200 tablet 1   atorvastatin (LIPITOR) 40 MG tablet TAKE 1 TABLET BY MOUTH DAILY 100 tablet 2   buPROPion (WELLBUTRIN XL) 150 MG 24 hr tablet TAKE 1 TABLET BY MOUTH DAILY 90 tablet 3   busPIRone (BUSPAR) 15 MG tablet TAKE 1 TABLET BY MOUTH  TWICE DAILY 180 tablet 1   carvedilol (COREG) 12.5 MG tablet Take 1.5 tablets (18.75 mg total) by mouth 2 (two) times daily. 300 tablet 2   celecoxib (CELEBREX) 100 MG capsule TAKE 1 CAPSULE BY MOUTH DAILY 100 capsule 2   cilostazol (PLETAL) 50 MG tablet TAKE 1 TABLET BY MOUTH TWICE  DAILY 200 tablet 2   clopidogrel (PLAVIX) 75 MG tablet TAKE 1 TABLET BY MOUTH  DAILY 90 tablet 3   escitalopram (LEXAPRO) 10 MG tablet TAKE 1 TABLET BY MOUTH DAILY 90 tablet 3   fenofibrate micronized (LOFIBRA) 134 MG capsule Take 1 capsule (134 mg total) by mouth daily before breakfast. 90 capsule 3   folic acid (FOLVITE) 1 MG tablet Take 1 tablet (1 mg total) by mouth daily. 30 tablet 0   furosemide (LASIX) 40 MG tablet TAKE 1 TABLET BY MOUTH DAILY 100 tablet 2   gabapentin (NEURONTIN) 100 MG capsule TAKE 2 CAPSULES BY MOUTH  IN THE  MORNING AND AT NIGHT; AND 1  CAPSULE IN THE AFTERNOON 500 capsule 2   hydrOXYzine (VISTARIL) 25 MG capsule Take  one tab po qd prn for anxiety 90 capsule 1   Multiple Vitamin (MULTIVITAMIN WITH MINERALS) TABS tablet Take 1 tablet by mouth daily.     pantoprazole (PROTONIX) 40 MG tablet TAKE 1 TABLET BY MOUTH DAILY 100 tablet 2   potassium chloride SA (KLOR-CON M20) 20 MEQ tablet Take 2 tablets (40 mEq total) by mouth daily. 60 tablet 0   traZODone (DESYREL) 100 MG tablet TAKE 1 TABLET BY MOUTH DAILY AT  BEDTIME 90 tablet 3   valsartan (DIOVAN) 320 MG tablet Take 1 tablet (320 mg total) by mouth daily. 90 tablet 3   No current facility-administered medications on file prior to visit.    There are no Patient Instructions on file for this visit. No follow-ups on file.   Georgiana Spinner, NP

## 2022-09-06 DIAGNOSIS — D631 Anemia in chronic kidney disease: Secondary | ICD-10-CM | POA: Diagnosis not present

## 2022-09-06 DIAGNOSIS — N1832 Chronic kidney disease, stage 3b: Secondary | ICD-10-CM | POA: Diagnosis not present

## 2022-09-06 DIAGNOSIS — Z791 Long term (current) use of non-steroidal anti-inflammatories (NSAID): Secondary | ICD-10-CM | POA: Diagnosis not present

## 2022-09-06 DIAGNOSIS — I2510 Atherosclerotic heart disease of native coronary artery without angina pectoris: Secondary | ICD-10-CM | POA: Diagnosis not present

## 2022-09-06 DIAGNOSIS — F17210 Nicotine dependence, cigarettes, uncomplicated: Secondary | ICD-10-CM | POA: Diagnosis not present

## 2022-09-06 DIAGNOSIS — J4489 Other specified chronic obstructive pulmonary disease: Secondary | ICD-10-CM | POA: Diagnosis not present

## 2022-09-06 DIAGNOSIS — I350 Nonrheumatic aortic (valve) stenosis: Secondary | ICD-10-CM | POA: Diagnosis not present

## 2022-09-06 DIAGNOSIS — K219 Gastro-esophageal reflux disease without esophagitis: Secondary | ICD-10-CM | POA: Diagnosis not present

## 2022-09-06 DIAGNOSIS — I421 Obstructive hypertrophic cardiomyopathy: Secondary | ICD-10-CM | POA: Diagnosis not present

## 2022-09-06 DIAGNOSIS — I480 Paroxysmal atrial fibrillation: Secondary | ICD-10-CM | POA: Diagnosis not present

## 2022-09-06 DIAGNOSIS — I5032 Chronic diastolic (congestive) heart failure: Secondary | ICD-10-CM | POA: Diagnosis not present

## 2022-09-06 DIAGNOSIS — I130 Hypertensive heart and chronic kidney disease with heart failure and stage 1 through stage 4 chronic kidney disease, or unspecified chronic kidney disease: Secondary | ICD-10-CM | POA: Diagnosis not present

## 2022-09-06 DIAGNOSIS — Z8673 Personal history of transient ischemic attack (TIA), and cerebral infarction without residual deficits: Secondary | ICD-10-CM | POA: Diagnosis not present

## 2022-09-06 DIAGNOSIS — M5430 Sciatica, unspecified side: Secondary | ICD-10-CM | POA: Diagnosis not present

## 2022-09-06 DIAGNOSIS — E559 Vitamin D deficiency, unspecified: Secondary | ICD-10-CM | POA: Diagnosis not present

## 2022-09-06 DIAGNOSIS — E7800 Pure hypercholesterolemia, unspecified: Secondary | ICD-10-CM | POA: Diagnosis not present

## 2022-09-06 DIAGNOSIS — S7011XD Contusion of right thigh, subsequent encounter: Secondary | ICD-10-CM | POA: Diagnosis not present

## 2022-09-06 DIAGNOSIS — Z7902 Long term (current) use of antithrombotics/antiplatelets: Secondary | ICD-10-CM | POA: Diagnosis not present

## 2022-09-06 DIAGNOSIS — G4733 Obstructive sleep apnea (adult) (pediatric): Secondary | ICD-10-CM | POA: Diagnosis not present

## 2022-09-06 DIAGNOSIS — F32A Depression, unspecified: Secondary | ICD-10-CM | POA: Diagnosis not present

## 2022-09-06 DIAGNOSIS — Z9981 Dependence on supplemental oxygen: Secondary | ICD-10-CM | POA: Diagnosis not present

## 2022-09-06 DIAGNOSIS — Z7901 Long term (current) use of anticoagulants: Secondary | ICD-10-CM | POA: Diagnosis not present

## 2022-09-06 DIAGNOSIS — I70219 Atherosclerosis of native arteries of extremities with intermittent claudication, unspecified extremity: Secondary | ICD-10-CM | POA: Diagnosis not present

## 2022-09-11 ENCOUNTER — Ambulatory Visit: Payer: 59

## 2022-12-07 ENCOUNTER — Other Ambulatory Visit: Payer: Self-pay | Admitting: Physician Assistant

## 2022-12-07 DIAGNOSIS — M810 Age-related osteoporosis without current pathological fracture: Secondary | ICD-10-CM

## 2022-12-10 ENCOUNTER — Telehealth: Payer: Self-pay | Admitting: Physician Assistant

## 2022-12-10 ENCOUNTER — Telehealth: Payer: Self-pay | Admitting: Internal Medicine

## 2022-12-10 NOTE — Telephone Encounter (Signed)
Patient moved to [facility]. Self-discharged-Sarah Barnes

## 2022-12-10 NOTE — Telephone Encounter (Signed)
pt move to [place], she will be seeing new doctors

## 2023-01-08 ENCOUNTER — Other Ambulatory Visit: Payer: Self-pay | Admitting: Nurse Practitioner

## 2023-01-26 ENCOUNTER — Other Ambulatory Visit: Payer: Self-pay | Admitting: Cardiology

## 2023-01-26 ENCOUNTER — Other Ambulatory Visit: Payer: Self-pay | Admitting: Physician Assistant

## 2023-02-07 ENCOUNTER — Telehealth: Payer: Self-pay

## 2023-02-07 NOTE — Telephone Encounter (Signed)
Patient is scheduled to see Dr. Herbie Baltimore on 02/14/23.  During chart prep I saw that she has cancelled other appts in [facility] system due to moving to [place].  Called patient to confirm that she has moved to [place].  Also to verify if she wanted to keep or cancel appt with Dr. Herbie Baltimore next week.  Patient states that she has moved to [place] and would like to cancel appt with Dr. Herbie Baltimore with no need to r/s.

## 2023-02-11 ENCOUNTER — Ambulatory Visit: Payer: Medicaid Other | Admitting: Physician Assistant

## 2023-02-14 ENCOUNTER — Ambulatory Visit: Payer: 59 | Admitting: Cardiology

## 2023-04-01 ENCOUNTER — Other Ambulatory Visit: Payer: Self-pay | Admitting: Physician Assistant

## 2023-04-03 ENCOUNTER — Encounter: Payer: Medicaid Other | Admitting: Internal Medicine

## 2023-04-08 ENCOUNTER — Telehealth: Payer: Self-pay | Admitting: Internal Medicine

## 2023-04-08 NOTE — Telephone Encounter (Signed)
05/21/2021-05/20/2022 MR uploaded to [facility]

## 2023-04-12 ENCOUNTER — Other Ambulatory Visit: Payer: Self-pay | Admitting: Physician Assistant

## 2023-04-12 DIAGNOSIS — M064 Inflammatory polyarthropathy: Secondary | ICD-10-CM

## 2023-04-19 ENCOUNTER — Other Ambulatory Visit: Payer: Self-pay | Admitting: Physician Assistant

## 2023-04-20 ENCOUNTER — Other Ambulatory Visit: Payer: Self-pay | Admitting: Physician Assistant

## 2023-04-20 ENCOUNTER — Other Ambulatory Visit: Payer: Self-pay | Admitting: Nurse Practitioner

## 2023-04-26 ENCOUNTER — Other Ambulatory Visit: Payer: Self-pay | Admitting: Nurse Practitioner

## 2023-04-26 ENCOUNTER — Other Ambulatory Visit: Payer: Self-pay | Admitting: Physician Assistant

## 2023-04-30 ENCOUNTER — Other Ambulatory Visit: Payer: Self-pay | Admitting: Physician Assistant

## 2023-04-30 DIAGNOSIS — M064 Inflammatory polyarthropathy: Secondary | ICD-10-CM

## 2023-05-07 ENCOUNTER — Other Ambulatory Visit: Payer: Self-pay | Admitting: Physician Assistant

## 2023-05-07 ENCOUNTER — Other Ambulatory Visit: Payer: Self-pay | Admitting: Nurse Practitioner

## 2023-05-08 NOTE — Telephone Encounter (Signed)
Please contact pt for future appointment. Pt due for 6 month f/u. 

## 2023-06-17 ENCOUNTER — Other Ambulatory Visit: Payer: Self-pay | Admitting: Nurse Practitioner

## 2023-06-22 ENCOUNTER — Other Ambulatory Visit: Payer: Self-pay | Admitting: Nurse Practitioner

## 2023-07-06 ENCOUNTER — Other Ambulatory Visit: Payer: Self-pay | Admitting: Nurse Practitioner

## 2023-07-22 ENCOUNTER — Other Ambulatory Visit: Payer: Self-pay | Admitting: Nurse Practitioner

## 2023-10-05 ENCOUNTER — Other Ambulatory Visit: Payer: Self-pay | Admitting: Cardiology
